# Patient Record
Sex: Male | Born: 1940 | Race: White | Hispanic: No | Smoking: Former smoker
Health system: Southern US, Community
[De-identification: ages and names within clinical notes are randomized; demographics above are authoritative.]

## PROBLEM LIST (undated history)

## (undated) DIAGNOSIS — I639 Cerebral infarction, unspecified: Secondary | ICD-10-CM

## (undated) DIAGNOSIS — E785 Hyperlipidemia, unspecified: Secondary | ICD-10-CM

## (undated) DIAGNOSIS — Z8619 Personal history of other infectious and parasitic diseases: Secondary | ICD-10-CM

## (undated) DIAGNOSIS — E669 Obesity, unspecified: Secondary | ICD-10-CM

## (undated) DIAGNOSIS — H53462 Homonymous bilateral field defects, left side: Secondary | ICD-10-CM

## (undated) DIAGNOSIS — I10 Essential (primary) hypertension: Secondary | ICD-10-CM

## (undated) DIAGNOSIS — I6529 Occlusion and stenosis of unspecified carotid artery: Secondary | ICD-10-CM

## (undated) HISTORY — DX: Essential (primary) hypertension: I10

## (undated) HISTORY — PX: EYE SURGERY: SHX253

## (undated) HISTORY — DX: Hyperlipidemia, unspecified: E78.5

## (undated) HISTORY — DX: Occlusion and stenosis of unspecified carotid artery: I65.29

## (undated) HISTORY — DX: Homonymous bilateral field defects, left side: H53.462

## (undated) HISTORY — PX: WISDOM TOOTH EXTRACTION: SHX21

## (undated) HISTORY — DX: Personal history of other infectious and parasitic diseases: Z86.19

## (undated) HISTORY — DX: Obesity, unspecified: E66.9

---

## 2002-07-12 HISTORY — PX: CATARACT EXTRACTION: SUR2

## 2007-02-17 ENCOUNTER — Ambulatory Visit: Payer: Self-pay | Admitting: Ophthalmology

## 2007-02-22 ENCOUNTER — Ambulatory Visit: Payer: Self-pay | Admitting: Ophthalmology

## 2007-02-22 ENCOUNTER — Ambulatory Visit: Payer: Self-pay | Admitting: Family Medicine

## 2012-05-12 DIAGNOSIS — I639 Cerebral infarction, unspecified: Secondary | ICD-10-CM

## 2012-05-12 DIAGNOSIS — H53462 Homonymous bilateral field defects, left side: Secondary | ICD-10-CM

## 2012-05-12 HISTORY — DX: Cerebral infarction, unspecified: I63.9

## 2012-05-12 HISTORY — DX: Homonymous bilateral field defects, left side: H53.462

## 2012-05-18 ENCOUNTER — Inpatient Hospital Stay (HOSPITAL_COMMUNITY)
Admission: EM | Admit: 2012-05-18 | Discharge: 2012-05-20 | DRG: 066 | Disposition: A | Discharge status: Home / Self Care | Payer: Medicare Other | Attending: Internal Medicine | Admitting: Internal Medicine

## 2012-05-18 ENCOUNTER — Encounter (HOSPITAL_COMMUNITY): Payer: Self-pay | Admitting: Family Medicine

## 2012-05-18 ENCOUNTER — Observation Stay (HOSPITAL_COMMUNITY): Payer: Medicare Other

## 2012-05-18 DIAGNOSIS — I6320 Cerebral infarction due to unspecified occlusion or stenosis of unspecified precerebral arteries: Secondary | ICD-10-CM | POA: Diagnosis present

## 2012-05-18 DIAGNOSIS — Z87891 Personal history of nicotine dependence: Secondary | ICD-10-CM | POA: Diagnosis not present

## 2012-05-18 DIAGNOSIS — I63239 Cerebral infarction due to unspecified occlusion or stenosis of unspecified carotid arteries: Principal | ICD-10-CM | POA: Diagnosis present

## 2012-05-18 DIAGNOSIS — I6523 Occlusion and stenosis of bilateral carotid arteries: Secondary | ICD-10-CM

## 2012-05-18 DIAGNOSIS — R200 Anesthesia of skin: Secondary | ICD-10-CM

## 2012-05-18 DIAGNOSIS — Z9119 Patient's noncompliance with other medical treatment and regimen: Secondary | ICD-10-CM | POA: Diagnosis not present

## 2012-05-18 DIAGNOSIS — I10 Essential (primary) hypertension: Secondary | ICD-10-CM | POA: Diagnosis not present

## 2012-05-18 DIAGNOSIS — I639 Cerebral infarction, unspecified: Secondary | ICD-10-CM

## 2012-05-18 DIAGNOSIS — R209 Unspecified disturbances of skin sensation: Secondary | ICD-10-CM | POA: Diagnosis not present

## 2012-05-18 DIAGNOSIS — Z7982 Long term (current) use of aspirin: Secondary | ICD-10-CM

## 2012-05-18 DIAGNOSIS — E785 Hyperlipidemia, unspecified: Secondary | ICD-10-CM | POA: Diagnosis present

## 2012-05-18 DIAGNOSIS — Z9849 Cataract extraction status, unspecified eye: Secondary | ICD-10-CM

## 2012-05-18 DIAGNOSIS — R6889 Other general symptoms and signs: Secondary | ICD-10-CM | POA: Diagnosis not present

## 2012-05-18 DIAGNOSIS — I6529 Occlusion and stenosis of unspecified carotid artery: Secondary | ICD-10-CM | POA: Diagnosis not present

## 2012-05-18 DIAGNOSIS — I6350 Cerebral infarction due to unspecified occlusion or stenosis of unspecified cerebral artery: Secondary | ICD-10-CM

## 2012-05-18 DIAGNOSIS — I658 Occlusion and stenosis of other precerebral arteries: Secondary | ICD-10-CM

## 2012-05-18 DIAGNOSIS — Z79899 Other long term (current) drug therapy: Secondary | ICD-10-CM

## 2012-05-18 DIAGNOSIS — Z8673 Personal history of transient ischemic attack (TIA), and cerebral infarction without residual deficits: Secondary | ICD-10-CM | POA: Insufficient documentation

## 2012-05-18 DIAGNOSIS — G458 Other transient cerebral ischemic attacks and related syndromes: Secondary | ICD-10-CM | POA: Diagnosis not present

## 2012-05-18 DIAGNOSIS — Z91199 Patient's noncompliance with other medical treatment and regimen due to unspecified reason: Secondary | ICD-10-CM

## 2012-05-18 LAB — CBC WITH DIFFERENTIAL/PLATELET
Basophils Absolute: 0 10*3/uL (ref 0.0–0.1)
Basophils Relative: 0 % (ref 0–1)
HCT: 41.7 % (ref 39.0–52.0)
Hemoglobin: 14.4 g/dL (ref 13.0–17.0)
Lymphocytes Relative: 38 % (ref 12–46)
MCHC: 34.5 g/dL (ref 30.0–36.0)
Neutro Abs: 3.9 10*3/uL (ref 1.7–7.7)
Neutrophils Relative %: 56 % (ref 43–77)
RDW: 13 % (ref 11.5–15.5)
WBC: 6.9 10*3/uL (ref 4.0–10.5)

## 2012-05-18 LAB — COMPREHENSIVE METABOLIC PANEL
ALT: 27 U/L (ref 0–53)
AST: 28 U/L (ref 0–37)
Albumin: 3.7 g/dL (ref 3.5–5.2)
Alkaline Phosphatase: 54 U/L (ref 39–117)
CO2: 24 mEq/L (ref 19–32)
Chloride: 103 mEq/L (ref 96–112)
GFR calc non Af Amer: 64 mL/min — ABNORMAL LOW (ref >90)
Potassium: 4.1 mEq/L (ref 3.5–5.1)
Total Bilirubin: 0.3 mg/dL (ref 0.3–1.2)

## 2012-05-18 LAB — URINALYSIS, ROUTINE W REFLEX MICROSCOPIC
Bilirubin Urine: NEGATIVE
Glucose, UA: NEGATIVE mg/dL
Hgb urine dipstick: NEGATIVE
Ketones, ur: NEGATIVE mg/dL
Leukocytes, UA: NEGATIVE
Protein, ur: NEGATIVE mg/dL
pH: 6.5 (ref 5.0–8.0)

## 2012-05-18 LAB — HEMOGLOBIN A1C
Hgb A1c MFr Bld: 5.4 % (ref <5.7)
Mean Plasma Glucose: 108 mg/dL (ref <117)

## 2012-05-18 MED ORDER — AMLODIPINE BESYLATE 5 MG PO TABS
5.0000 mg | ORAL_TABLET | Freq: Every day | ORAL | Status: DC
  Administered 2012-05-18 – 2012-05-19 (×2): 5 mg via ORAL
  Filled 2012-05-18 (×5): qty 1

## 2012-05-18 MED ORDER — SENNOSIDES-DOCUSATE SODIUM 8.6-50 MG PO TABS
1.0000 | ORAL_TABLET | Freq: Every evening | ORAL | Status: DC | PRN
  Administered 2012-05-18: 1 via ORAL
  Filled 2012-05-18: qty 1

## 2012-05-18 MED ORDER — METOPROLOL TARTRATE 1 MG/ML IV SOLN
5.0000 mg | Freq: Once | INTRAVENOUS | Status: AC
  Administered 2012-05-18: 5 mg via INTRAVENOUS
  Filled 2012-05-18: qty 5

## 2012-05-18 MED ORDER — ASPIRIN 300 MG RE SUPP
300.0000 mg | Freq: Every day | RECTAL | Status: DC
  Filled 2012-05-18 (×3): qty 1

## 2012-05-18 MED ORDER — ENOXAPARIN SODIUM 40 MG/0.4ML ~~LOC~~ SOLN
40.0000 mg | SUBCUTANEOUS | Status: DC
  Administered 2012-05-18 – 2012-05-19 (×2): 40 mg via SUBCUTANEOUS
  Filled 2012-05-18 (×3): qty 0.4

## 2012-05-18 MED ORDER — METOPROLOL TARTRATE 1 MG/ML IV SOLN: 5.0000 mg | Freq: Once | INTRAVENOUS | Status: DC

## 2012-05-18 MED ORDER — ACETAMINOPHEN 500 MG PO TABS
1000.0000 mg | ORAL_TABLET | Freq: Four times a day (QID) | ORAL | Status: DC | PRN
  Filled 2012-05-18: qty 2

## 2012-05-18 MED ORDER — ASPIRIN 325 MG PO TABS
325.0000 mg | ORAL_TABLET | Freq: Every day | ORAL | Status: DC
  Administered 2012-05-18 – 2012-05-19 (×2): 325 mg via ORAL
  Filled 2012-05-18 (×3): qty 1

## 2012-05-18 NOTE — ED Notes (Signed)
Per EMS, pt had one episode of numbness on Monday that resolved and an episode today that resolved. Pt very hypertensive in the 200 systolic. Pt upset. Pt has HA over right eye. No speech difficulties.

## 2012-05-18 NOTE — H&P (Signed)
[facility] History and Physical  Craig Avery WGN:[id number] DOB: 02/23/1941 DOA: 05/18/2012  Referring physician: ED PCP: Patient does not go to doctors Specialists: Neuro  Chief Complaint:  Chief Complaint  Patient presents with  . Hypertension  . Numbness     HPI: Craig Avery is a 71 y.o. male with hx of untreated HTN and noncompliance who was brought to the ED today by his wife for left body numbness that happened twice in the past 72 hrs. His symptoms resolved by now and he denies any associated weakness. He denies any speech difficulties, balance problems, vision problems. MRI in the emergency room his positive for stroke in the occipital lobes as well as severe posterior circulation atherosclerosis. Patient also has bilateral internal carotid artery stenosis with left side being more than 80% occluded. He has known long-standing hypertension for which she has never taken any treatment beyond one month at a time. Patient is to abuse tobacco but quit about 15 years ago   Review of Systems: The patient denies anorexia, fever, weight loss,, vision loss, decreased hearing, hoarseness, chest pain, syncope, dyspnea on exertion, peripheral edema, balance deficits, hemoptysis, abdominal pain, melena, hematochezia, severe indigestion/heartburn, hematuria, incontinence, genital sores, muscle weakness, suspicious skin lesions, transient blindness, difficulty walking, depression, unusual weight change, abnormal bleeding, enlarged lymph nodes,  Past medical history is positive for untreated hypertension Past surgical history is positive for  cataract surgery   Social History: Patient used to smoke heavily but quit 15 years ago. He doesn't drink alcohol. He is married and still works full-time as a Music therapist   No Known Allergies  History reviewed. No pertinent family history. particularly negative for premature coronary artery disease   Prior to Admission medications   Medication  Sig Start Date End Date Taking? Authorizing Provider  acetaminophen (TYLENOL) 500 MG tablet Take 1,000 mg by mouth every 6 (six) hours as needed. For pain   Yes Historical Provider, MD   Physical Exam: Filed Vitals:   05/18/12 1001 05/18/12 1036 05/18/12 1204 05/18/12 1206  BP: 224/73 217/68 203/65 197/62  Pulse: 65  51   Temp: 98.6 F (37 C)  98.5 F (36.9 C)   TempSrc: Oral  Oral   Resp: 20  10   SpO2: 99% 95% 97%      General:  Alert and oriented x3, normocephalic atraumatic, in no acute respiratory distress, calm and cooperative, alopecia  Eyes: Pupil equal round react to light accommodation, extraocular movement is intact  ENT: Clear pharynx, normal appearing external ears  Neck: No thyromegaly no jugular venous distention  Cardiovascular: Regular rate and rhythm, bradycardic, no murmurs rubs or gallops  Respiratory: Clear to auscultation bilaterally without wheeze rhonchi crackles  Abdomen: Soft nontender nondistended, bowel sounds are present  Skin: Warm dry without rashes  Musculoskeletal: No significant muscle atrophy,   Psychiatric: Euthymic  Neurologic: Cranial nerves 2-12 intact, 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, strength 5 out of 5 in all 4 extremities, sensation intact, finger to nose intact, deep tendon reflexes +2 and symmetric, plantars downgoing  Labs on Admission:  Basic Metabolic Panel:  Lab 05/18/12 7846  NA 138  K 4.1  CL 103  CO2 24  GLUCOSE 145*  BUN 13  CREATININE 1.11  CALCIUM 9.1  MG --  PHOS --   Liver Function Tests:  Lab 05/18/12 1029  AST 28  ALT 27  ALKPHOS 54  BILITOT 0.3  PROT 7.0  ALBUMIN 3.7   No results found for this  basename: LIPASE:5,AMYLASE:5 in the last 168 hours No results found for this basename: AMMONIA:5 in the last 168 hours CBC:  Lab 05/18/12 1029  WBC 6.9  NEUTROABS 3.9  HGB 14.4  HCT 41.7  MCV 89.3  PLT 194   Cardiac Enzymes:  Lab 05/18/12 1029  CKTOTAL --  CKMB --    CKMBINDEX --  TROPONINI <0.30    BNP (last 3 results) No results found for this basename: PROBNP:3 in the last 8760 hours CBG: No results found for this basename: GLUCAP:5 in the last 168 hours  Radiological Exams on Admission: Mr Brain Wo Contrast  05/18/2012  *RADIOLOGY REPORT*  Clinical Data:  71 year old male with left side numbness and tingling.  Hypertension.  Comparison: None.  MRI HEAD WITHOUT CONTRAST  Technique: Multiplanar, multiecho pulse sequences of the brain and surrounding structures were obtained according to standard protocol without intravenous contrast.  Findings: There is an 11 mm globular area of restricted diffusion in the right occipital pole mostly affecting white matter (series 3 image 11).  There is also restricted diffusion in the body of the right hippocampus (series 3 image 13).  No other definite diffusion abnormality.  These areas show T2 and FLAIR hyperintensity without mass effect or hemorrhage.  Major intracranial vascular flow voids are preserved.  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  Mild involvement of the left lentiform nuclei.  Possible tiny chronic lacunar infarct in the medial left thalamus.  Other deep gray matter nuclei within normal limits.  Probable inconsequential dilated perivascular spaces in the mid brain. Brainstem, cerebellum, and visualized cervical spine are within normal limits.  No cerebral cortical encephalomalacia.  Negative pituitary. No midline shift, mass effect, or evidence of mass lesion.  No ventriculomegaly.  Normal marrow signal.  Postoperative changes to the globes. Scattered left mastoid air cell fluid.  Negative visualized nasopharynx.  Other Visualized paranasal sinuses and mastoids are clear.  Negative scalp soft tissues.  IMPRESSION: 1.  Small acute infarcts in the right hippocampus and right occipital lobe, implicating right PCA territory ischemia.  No mass effect or hemorrhage. 2.  Chronic small  vessel ischemia in the left basal ganglia and possibly also the medial left thalamus. 3.  See MRA findings below. 4. Mild left mastoid effusion.  MRA HEAD WITHOUT CONTRAST  Technique: Angiographic images of the Circle of Willis were obtained using MRA technique without  intravenous contrast.  Findings: Antegrade flow in the posterior circulation with relatively codominant distal vertebral arteries.  Normal PICA vessels.  Patent vertebrobasilar junction.  No basilar stenosis or irregularity.  SCA and PCA origins are within normal limits.  In the left PCA P2 segment there is a short segment stenosis which appears moderate to severe.  There is preserved more distal left PCA flow.  On the right there is a larger 45 mm segment of loss of flow signal in the distal right P2 segment.  More distal PCA flow is attenuated beyond this point.  Diminutive right posterior communicating artery.  Antegrade flow in both ICA siphons.  Cavernous and supraclinoid segment irregularity, but no hemodynamically significant ICA stenosis.  Ophthalmic artery origins are within normal limits. Small infundibulum of the distal left ICA probably related to the anterior choroidal artery origin.  The left posterior communicating artery is diminutive or absent.  Normal right posterior communicating artery origin.  Carotid termini are patent.  ACA origins are within normal limits.  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  Other visualized ACA branches are within normal limits.  MCA origins are patent.  Both M1 segment are regular, more so the right.  There is a mild proximal left MCA M1 stenosis.  There is a mild mid right M1 segment stenosis.  Left MCA trifurcation and right MCA bifurcation are patent.  Minimal to mild irregularity in the visualized MCA branches.  IMPRESSION: 1.  High-grade stenosis or focal near occlusion of the right PCA distal P2 segment with  attenuated more distal right PCA flow. 2.  Moderate to severe focal stenosis of the left PCA P2 segment. 3.  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 4.  Mild to moderate anterior circulation atherosclerosis, most pronounced in the MCA M1 segments.  Study discussed by telephone with Dr. Chaney Malling on 05/18/2012 at 1350 hours.   Original Report Authenticated By: Erskine Speed, M.D.    Mr Mra Head/brain Wo Cm  05/18/2012  *RADIOLOGY REPORT*  Clinical Data:  71 year old male with left side numbness and tingling.  Hypertension.  Comparison: None.  MRI HEAD WITHOUT CONTRAST  Technique: Multiplanar, multiecho pulse sequences of the brain and surrounding structures were obtained according to standard protocol without intravenous contrast.  Findings: There is an 11 mm globular area of restricted diffusion in the right occipital pole mostly affecting white matter (series 3 image 11).  There is also restricted diffusion in the body of the right hippocampus (series 3 image 13).  No other definite diffusion abnormality.  These areas show T2 and FLAIR hyperintensity without mass effect or hemorrhage.  Major intracranial vascular flow voids are preserved.  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  Mild involvement of the left lentiform nuclei.  Possible tiny chronic lacunar infarct in the medial left thalamus.  Other deep gray matter nuclei within normal limits.  Probable inconsequential dilated perivascular spaces in the mid brain. Brainstem, cerebellum, and visualized cervical spine are within normal limits.  No cerebral cortical encephalomalacia.  Negative pituitary. No midline shift, mass effect, or evidence of mass lesion.  No ventriculomegaly.  Normal marrow signal.  Postoperative changes to the globes. Scattered left mastoid air cell fluid.  Negative visualized nasopharynx.  Other Visualized paranasal sinuses and mastoids are clear.  Negative scalp soft tissues.  IMPRESSION: 1.   Small acute infarcts in the right hippocampus and right occipital lobe, implicating right PCA territory ischemia.  No mass effect or hemorrhage. 2.  Chronic small vessel ischemia in the left basal ganglia and possibly also the medial left thalamus. 3.  See MRA findings below. 4. Mild left mastoid effusion.  MRA HEAD WITHOUT CONTRAST  Technique: Angiographic images of the Circle of Willis were obtained using MRA technique without  intravenous contrast.  Findings: Antegrade flow in the posterior circulation with relatively codominant distal vertebral arteries.  Normal PICA vessels.  Patent vertebrobasilar junction.  No basilar stenosis or irregularity.  SCA and PCA origins are within normal limits.  In the left PCA P2 segment there is a short segment stenosis which appears moderate to severe.  There is preserved more distal left PCA flow.  On the right there is a larger 45 mm segment of loss of flow signal in the distal right P2 segment.  More distal PCA flow is attenuated beyond this point.  Diminutive right posterior communicating artery.  Antegrade flow in both ICA siphons.  Cavernous and supraclinoid segment irregularity, but no hemodynamically significant ICA stenosis.  Ophthalmic artery origins are within normal limits. Small infundibulum of the distal left ICA probably related to the anterior choroidal artery origin.  The left posterior communicating artery is diminutive or absent.  Normal right posterior communicating artery origin.  Carotid termini are patent.  ACA origins are within normal limits.  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  Other visualized ACA branches are within normal limits.  MCA origins are patent.  Both M1 segment are regular, more so the right.  There is a mild proximal left MCA M1 stenosis.  There is a mild mid right M1 segment stenosis.  Left MCA trifurcation and right MCA bifurcation are patent.   Minimal to mild irregularity in the visualized MCA branches.  IMPRESSION: 1.  High-grade stenosis or focal near occlusion of the right PCA distal P2 segment with attenuated more distal right PCA flow. 2.  Moderate to severe focal stenosis of the left PCA P2 segment. 3.  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 4.  Mild to moderate anterior circulation atherosclerosis, most pronounced in the MCA M1 segments.  Study discussed by telephone with Dr. Chaney Malling on 05/18/2012 at 1350 hours.   Original Report Authenticated By: Erskine Speed, M.D.     EKG: Independently reviewed. Sinus bradycardia without ST changes  Assessment/Plan Principal Problem:  *Stroke Active Problems:  HTN (hypertension)  Carotid stenosis, bilateral  S/P cataract surgery   1. Stroke-in the posterior circulation-most likely etiology is due to atherosclerosis. Plan for admission to the hospital, start aspirin, check fasting lipid panel, check hemoglobin A1c, obtain neurological consultation 2. Widespread extracranial and intracranial atherosclerosis-probably patient would benefit from CT angiogram to better delineate the lesions. Further plans depending on recommendation from neurology team 3. Hypertension-initiate gentle treatment-start amlodipine 5 mg daily  Neurology will see later tonight   Code Status: full  Family Communication: wife at bedside  Disposition Plan: home (LOS 2 days)  Time spent: 1 hour  Ragnar Waas [facility] Pager [phone]  If 7PM-7AM, please contact night-coverage [email or web address] Password [facility] 05/18/2012, 3:27 PM

## 2012-05-18 NOTE — Progress Notes (Signed)
  Echocardiogram 2D Echocardiogram has been performed.  Craig Avery 05/18/2012, 11:54 AM

## 2012-05-18 NOTE — ED Provider Notes (Signed)
11:17 AM Patient with a hx sig for intermittent left sided numbness for 4 days was placed in CDU on TIA protocol by Dr Silverio Lay. Patient care resumed from Dr. Silverio Lay.  Patient is here for possible TIA. Patient re-evaluated and is resting comfortable, VSS, with no new complaints or concerns at this time. No neuro deficits and patient is asymptomatic at this time. Plan per previous provider is to wait for MRI/MRA results to determine patient disposition. On exam: hemodynamically stable, NAD, heart w/ RRR, lungs CTAB, Chest & abd non-tender, no peripheral edema or calf tenderness.   12:54 PM Carotids shows >80% occlusion which qualifies the patient for admission. MRI of brain pending then will consult for admission.   2:22 PM Imaging shows acute infarct. Will consult hospitalist and neurology.   Emilia Beck, PA-C 05/19/12 1601

## 2012-05-18 NOTE — ED Notes (Signed)
Pt to radiology transport by D.T.

## 2012-05-18 NOTE — Progress Notes (Signed)
VASCULAR LAB PRELIMINARY  PRELIMINARY  PRELIMINARY  PRELIMINARY  Carotid Dopplers completed.    Preliminary report:  There is 40-59% right ICA stenosis, highest end of scale.  There is >80% left ICA stenosis.  Bilateral vertebral artery flow is antegrade.  Khara Renaud, 05/18/2012, 11:38 AM

## 2012-05-18 NOTE — Consult Note (Signed)
Referring Physician: Dr. Lavera Guise    Chief Complaint: Recurrent episodes of numbness involving his left side.  HPI: Craig Avery is an 71 y.o. male history of hypertension and poor compliance with recommended treatment, who presented with history of recurrent episodes of numbness involving his left side starting on 05/15/2012. Patient had 3 episodes of complete numbness involving face arm and leg, last of which occurred at 8 AM today and lasted about half an hour. Symptoms resolved completely after each occurrence of numbness. There is no previous history of stroke nor TIA. Patient has not been on antiplatelet therapy. MRI of his brain showed acute infarction involving the right occipital region and right thalamus (right posterior cerebral artery distribution). MRA showed diffuse atherosclerotic changes with a high-grade stenosis of the P2 segment of the right posterior cerebral artery, as well as moderate to severe stenosis of the P2 segment of the left posterior cerebral artery. 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 NIH stroke score was 0.  LSN: 8 AM today tPA Given: No: Rapid resolution of deficits MRankin: 0  History reviewed. No pertinent past medical history.  History reviewed. No pertinent family history.   Medications:  Scheduled:   . amLODipine  5 mg Oral Daily  . aspirin  300 mg Rectal Daily   Or  . aspirin  325 mg Oral Daily  . enoxaparin  40 mg Subcutaneous Q24H  . [COMPLETED] metoprolol  5 mg Intravenous Once  . [DISCONTINUED] metoprolol  5 mg Intravenous Once   NWG:[id number], senna-docusate   Physical Examination: Blood pressure 157/84, pulse 52, temperature 98 F (36.7 C), temperature source Oral, resp. rate 18, weight 94.1 kg (207 lb 7.3 oz), SpO2 100.00%.  Neurologic Examination: Mental Status: Alert, oriented, thought content appropriate.  Speech fluent without evidence of aphasia. Able to follow commands without  difficulty. Cranial Nerves: II-Visual fields were normal. III/IV/VI-Pupils were equal and reacted. Extraocular movements were full and conjugate.    V/VII-no facial numbness and no facial weakness. VIII-normal. X-normal speech and symmetrical palatal movement. Motor: 5/5 bilaterally with normal tone and bulk Sensory: Normal throughout. Deep Tendon Reflexes: 1+ and symmetric. Plantars: Flexor bilaterally Cerebellar: Normal finger-to-nose testing. Carotid auscultation: Normal   Mr Brain Wo Contrast  05/18/2012  *RADIOLOGY REPORT*  Clinical Data:  71 year old male with left side numbness and tingling.  Hypertension.  Comparison: None.  MRI HEAD WITHOUT CONTRAST  Technique: Multiplanar, multiecho pulse sequences of the brain and surrounding structures were obtained according to standard protocol without intravenous contrast.  Findings: There is an 11 mm globular area of restricted diffusion in the right occipital pole mostly affecting white matter (series 3 image 11).  There is also restricted diffusion in the body of the right hippocampus (series 3 image 13).  No other definite diffusion abnormality.  These areas show T2 and FLAIR hyperintensity without mass effect or hemorrhage.  Major intracranial vascular flow voids are preserved.  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  Mild involvement of the left lentiform nuclei.  Possible tiny chronic lacunar infarct in the medial left thalamus.  Other deep gray matter nuclei within normal limits.  Probable inconsequential dilated perivascular spaces in the mid brain. Brainstem, cerebellum, and visualized cervical spine are within normal limits.  No cerebral cortical encephalomalacia.  Negative pituitary. No midline shift, mass effect, or evidence of mass lesion.  No ventriculomegaly.  Normal marrow signal.  Postoperative changes to the globes. Scattered left mastoid air cell fluid.  Negative visualized nasopharynx.  Other Visualized  paranasal sinuses and mastoids are clear.  Negative scalp soft tissues.  IMPRESSION: 1.  Small acute infarcts in the right hippocampus and right occipital lobe, implicating right PCA territory ischemia.  No mass effect or hemorrhage. 2.  Chronic small vessel ischemia in the left basal ganglia and possibly also the medial left thalamus. 3.  See MRA findings below. 4. Mild left mastoid effusion.  MRA HEAD WITHOUT CONTRAST  Technique: Angiographic images of the Circle of Willis were obtained using MRA technique without  intravenous contrast.  Findings: Antegrade flow in the posterior circulation with relatively codominant distal vertebral arteries.  Normal PICA vessels.  Patent vertebrobasilar junction.  No basilar stenosis or irregularity.  SCA and PCA origins are within normal limits.  In the left PCA P2 segment there is a short segment stenosis which appears moderate to severe.  There is preserved more distal left PCA flow.  On the right there is a larger 45 mm segment of loss of flow signal in the distal right P2 segment.  More distal PCA flow is attenuated beyond this point.  Diminutive right posterior communicating artery.  Antegrade flow in both ICA siphons.  Cavernous and supraclinoid segment irregularity, but no hemodynamically significant ICA stenosis.  Ophthalmic artery origins are within normal limits. Small infundibulum of the distal left ICA probably related to the anterior choroidal artery origin.  The left posterior communicating artery is diminutive or absent.  Normal right posterior communicating artery origin.  Carotid termini are patent.  ACA origins are within normal limits.  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  Other visualized ACA branches are within normal limits.  MCA origins are patent.  Both M1 segment are regular, more so the right.  There is a mild proximal left MCA M1 stenosis.  There is a mild mid right  M1 segment stenosis.  Left MCA trifurcation and right MCA bifurcation are patent.  Minimal to mild irregularity in the visualized MCA branches.  IMPRESSION: 1.  High-grade stenosis or focal near occlusion of the right PCA distal P2 segment with attenuated more distal right PCA flow. 2.  Moderate to severe focal stenosis of the left PCA P2 segment. 3.  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 4.  Mild to moderate anterior circulation atherosclerosis, most pronounced in the MCA M1 segments.  Study discussed by telephone with Dr. Chaney Malling on 05/18/2012 at 1350 hours.   Original Report Authenticated By: Erskine Speed, M.D.    Mr Mra Head/brain Wo Cm  05/18/2012  *RADIOLOGY REPORT*  Clinical Data:  71 year old male with left side numbness and tingling.  Hypertension.  Comparison: None.  MRI HEAD WITHOUT CONTRAST  Technique: Multiplanar, multiecho pulse sequences of the brain and surrounding structures were obtained according to standard protocol without intravenous contrast.  Findings: There is an 11 mm globular area of restricted diffusion in the right occipital pole mostly affecting white matter (series 3 image 11).  There is also restricted diffusion in the body of the right hippocampus (series 3 image 13).  No other definite diffusion abnormality.  These areas show T2 and FLAIR hyperintensity without mass effect or hemorrhage.  Major intracranial vascular flow voids are preserved.  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  Mild involvement of the left lentiform nuclei.  Possible tiny chronic lacunar infarct in the medial left thalamus.  Other deep  gray matter nuclei within normal limits.  Probable inconsequential dilated perivascular spaces in the mid brain. Brainstem, cerebellum, and visualized cervical spine are within normal limits.  No cerebral cortical encephalomalacia.  Negative pituitary. No midline shift, mass effect, or evidence of mass lesion.  No  ventriculomegaly.  Normal marrow signal.  Postoperative changes to the globes. Scattered left mastoid air cell fluid.  Negative visualized nasopharynx.  Other Visualized paranasal sinuses and mastoids are clear.  Negative scalp soft tissues.  IMPRESSION: 1.  Small acute infarcts in the right hippocampus and right occipital lobe, implicating right PCA territory ischemia.  No mass effect or hemorrhage. 2.  Chronic small vessel ischemia in the left basal ganglia and possibly also the medial left thalamus. 3.  See MRA findings below. 4. Mild left mastoid effusion.  MRA HEAD WITHOUT CONTRAST  Technique: Angiographic images of the Circle of Willis were obtained using MRA technique without  intravenous contrast.  Findings: Antegrade flow in the posterior circulation with relatively codominant distal vertebral arteries.  Normal PICA vessels.  Patent vertebrobasilar junction.  No basilar stenosis or irregularity.  SCA and PCA origins are within normal limits.  In the left PCA P2 segment there is a short segment stenosis which appears moderate to severe.  There is preserved more distal left PCA flow.  On the right there is a larger 45 mm segment of loss of flow signal in the distal right P2 segment.  More distal PCA flow is attenuated beyond this point.  Diminutive right posterior communicating artery.  Antegrade flow in both ICA siphons.  Cavernous and supraclinoid segment irregularity, but no hemodynamically significant ICA stenosis.  Ophthalmic artery origins are within normal limits. Small infundibulum of the distal left ICA probably related to the anterior choroidal artery origin.  The left posterior communicating artery is diminutive or absent.  Normal right posterior communicating artery origin.  Carotid termini are patent.  ACA origins are within normal limits.  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  Other visualized ACA branches are  within normal limits.  MCA origins are patent.  Both M1 segment are regular, more so the right.  There is a mild proximal left MCA M1 stenosis.  There is a mild mid right M1 segment stenosis.  Left MCA trifurcation and right MCA bifurcation are patent.  Minimal to mild irregularity in the visualized MCA branches.  IMPRESSION: 1.  High-grade stenosis or focal near occlusion of the right PCA distal P2 segment with attenuated more distal right PCA flow. 2.  Moderate to severe focal stenosis of the left PCA P2 segment. 3.  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 4.  Mild to moderate anterior circulation atherosclerosis, most pronounced in the MCA M1 segments.  Study discussed by telephone with Dr. Chaney Malling on 05/18/2012 at 1350 hours.   Original Report Authenticated By: Erskine Speed, M.D.     Assessment: 71 y.o. 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  Stroke Risk Factors - hypertension  Plan: 1. HgbA1c, fasting lipid panel 2. Echocardiogram 3. Carotid dopplers 4. Prophylactic therapy-Antiplatelet med: Aspirin 81 mg daily 5. Risk factor modification 6. Telemetry monitoring  C.R. Roseanne Reno, MD [facility] [phone]  05/18/2012, 8:21 PM

## 2012-05-18 NOTE — ED Notes (Signed)
Pt to radiology.

## 2012-05-18 NOTE — ED Provider Notes (Signed)
History     CSN: [id number]  Arrival date & time 05/18/12  4098   First MD Initiated Contact with Patient 05/18/12 [phone]      Chief Complaint  Patient presents with  . Hypertension  . Numbness    (Consider location/radiation/quality/duration/timing/severity/associated sxs/prior treatment) The history is provided by the patient.  Craig Avery is a 71 y.o. male here with L arm and leg numbness. Intermittent numbness 4 days ago on the L side that resolved. No weakness or speech problems. He hasn't seen a doctor in many years and not diagnosed with any medical problems. Today, he had another episode of numbness on L side. His BP at work was over 200 and he was sent for eval. No chest pain or SOB.    History reviewed. No pertinent past medical history.  History reviewed. No pertinent past surgical history.  History reviewed. No pertinent family history.  History  Substance Use Topics  . Smoking status: Never Smoker   . Smokeless tobacco: Not on file  . Alcohol Use: No      Review of Systems  Neurological: Positive for numbness.  All other systems reviewed and are negative.    Allergies  Review of patient's allergies indicates not on file.  Home Medications  No current outpatient prescriptions on file.  BP 224/73  Pulse 65  Temp 98.6 F (37 C) (Oral)  Resp 20  SpO2 99%  Physical Exam  Nursing note and vitals reviewed. Constitutional: He is oriented to person, place, and time. He appears well-developed and well-nourished.       NAD  HENT:  Head: Normocephalic.  Mouth/Throat: Oropharynx is clear and moist.  Eyes: Conjunctivae normal are normal. Pupils are equal, round, and reactive to light.  Neck: Normal range of motion. Neck supple.  Cardiovascular: Normal rate, regular rhythm and normal heart sounds.   Pulmonary/Chest: Effort normal and breath sounds normal. No respiratory distress. He has no wheezes. He has no rales.  Abdominal: Soft. Bowel sounds are  normal. He exhibits no distension. There is no tenderness. There is no rebound.  Musculoskeletal: Normal range of motion. He exhibits no edema and no tenderness.  Neurological: He is alert and oriented to person, place, and time. No cranial nerve deficit. Coordination normal.       Nl finger to nose, no pronator drift. NL sensation and motor otherwise.   Skin: Skin is warm and dry.  Psychiatric: He has a normal mood and affect. His behavior is normal. Judgment and thought content normal.    ED Course  Procedures (including critical care time)   Labs Reviewed  CBC WITH DIFFERENTIAL  COMPREHENSIVE METABOLIC PANEL  TROPONIN I  URINALYSIS, ROUTINE W REFLEX MICROSCOPIC  HEMOGLOBIN A1C   No results found.   1. Numbness and tingling   2. Hypertension      Date: 05/18/2012  Rate: 61  Rhythm: normal sinus rhythm  QRS Axis: normal  Intervals: normal  ST/T Wave abnormalities: normal  Conduction Disutrbances:none  Narrative Interpretation: No previous EKG   Old EKG Reviewed: unchanged     MDM  Jeremaine Reineck is a 70 y.o. male here with HTN, L sided numbness. Concerning for possible TIA vs hypertensive urgency. EKG nl, will check labs, will place on TIA protocol. I gave report to CDU PA.         Richardean Canal, MD 05/18/12 1030

## 2012-05-19 ENCOUNTER — Encounter (HOSPITAL_COMMUNITY): Payer: Self-pay | Admitting: *Deleted

## 2012-05-19 DIAGNOSIS — R209 Unspecified disturbances of skin sensation: Secondary | ICD-10-CM

## 2012-05-19 LAB — LIPID PANEL
LDL Cholesterol: 117 mg/dL — ABNORMAL HIGH (ref 0–99)
Triglycerides: 168 mg/dL — ABNORMAL HIGH (ref <150)
VLDL: 34 mg/dL (ref 0–40)

## 2012-05-19 MED ORDER — LISINOPRIL 5 MG PO TABS
5.0000 mg | ORAL_TABLET | Freq: Every day | ORAL | Status: DC
  Administered 2012-05-19: 5 mg via ORAL
  Filled 2012-05-19 (×2): qty 1

## 2012-05-19 MED ORDER — ATORVASTATIN CALCIUM 10 MG PO TABS
10.0000 mg | ORAL_TABLET | Freq: Every day | ORAL | Status: DC
  Administered 2012-05-19: 10 mg via ORAL
  Filled 2012-05-19 (×2): qty 1

## 2012-05-19 NOTE — Evaluation (Signed)
Occupational Therapy Evaluation Patient Details Name: Craig Avery MRN: [id number] DOB: 07-03-1941 Today's Date: 05/19/2012 Time: 5621-3086 OT Time Calculation (min): 18 min  OT Assessment / Plan / Recommendation Clinical Impression  This 71 y.o. male admitted with Rt. occipital and thalamic CVA.  Pt appears to be at baseline.  No deficits detected.  Will sign off.    OT Assessment  Patient does not need any further OT services    Follow Up Recommendations  No OT follow up    Barriers to Discharge      Equipment Recommendations  None recommended by PT;None recommended by OT;None recommended by SLP    Recommendations for Other Services    Frequency       Precautions / Restrictions Precautions Precautions: None Restrictions Weight Bearing Restrictions: No       ADL  Eating/Feeding: Independent Where Assessed - Eating/Feeding: Edge of bed Grooming: Wash/dry hands;Wash/dry face;Teeth care;Denture care;Independent Where Assessed - Grooming: Unsupported standing Upper Body Bathing: Independent Lower Body Bathing: Independent Where Assessed - Lower Body Bathing: Unsupported sit to stand Upper Body Dressing: Independent Where Assessed - Upper Body Dressing: Unsupported sitting Lower Body Dressing: Independent Where Assessed - Lower Body Dressing: Unsupported sit to stand Toilet Transfer: Independent Toilet Transfer Method: Sit to Barista: Regular height toilet Toileting - Clothing Manipulation and Hygiene: Independent Where Assessed - Toileting Clothing Manipulation and Hygiene: Standing Transfers/Ambulation Related to ADLs: Independentq ADL Comments: Pt. reports no difficulty with ADLs.  Pt instructed in signs/symptoms of CVA    OT Diagnosis:    OT Problem List:   OT Treatment Interventions:     OT Goals    Visit Information  Last OT Received On: 05/19/12 Assistance Needed: +1    Subjective Data  Subjective: "I had a little blurriness  on the left, but it went away" Patient Stated Goal: To get back to work and home   Prior Functioning     Home Living Lives With: Spouse Available Help at Discharge: Family;Available 24 hours/day Type of Home: House Home Access: Ramped entrance Home Layout: One level Bathroom Shower/Tub: Tub/shower unit;Curtain Firefighter: Standard Bathroom Accessibility: Yes How Accessible: Accessible via walker Home Adaptive Equipment: Straight cane;Walker - rolling;Wheelchair - manual;Bedside commode/3-in-1 Prior Function Level of Independence: Independent Able to Take Stairs?: Yes Driving: Yes Vocation: Full time employment Communication Communication: No difficulties Dominant Hand: Right         Vision/Perception Vision - Assessment Eye Alignment: Within Functional Limits Vision Assessment: Vision tested Ocular Range of Motion: Within Functional Limits Alignment/Gaze Preference: Within Defined Limits Tracking/Visual Pursuits: Able to track stimulus in all quads without difficulty Saccades: Within functional limits Convergence: Within functional limits Visual Fields: No apparent deficits Additional Comments: Pt able to read newspaper with no difficulty Perception Perception: Within Functional Limits Praxis Praxis: Intact   Cognition  Overall Cognitive Status: Appears within functional limits for tasks assessed/performed Arousal/Alertness: Awake/alert Orientation Level: Appears intact for tasks assessed Behavior During Session: [facility] for tasks performed    Extremity/Trunk Assessment Right Upper Extremity Assessment RUE ROM/Strength/Tone: Within functional levels RUE Sensation: WFL - Light Touch RUE Coordination: WFL - gross/fine motor Left Upper Extremity Assessment LUE ROM/Strength/Tone: Within functional levels LUE Sensation: WFL - Light Touch LUE Coordination: WFL - gross/fine motor Trunk Assessment Trunk Assessment: Normal     Mobility Bed Mobility Bed  Mobility: Supine to Sit;Sitting - Scoot to Edge of Bed;Sit to Supine Supine to Sit: 7: Independent Sitting - Scoot to Edge of Bed: 7: Independent Sit  to Supine: 7: Independent Transfers Transfers: Sit to Stand;Stand to Sit Sit to Stand: 7: Independent;From bed Stand to Sit: 7: Independent;To bed     Shoulder Instructions     Exercise     Balance     End of Session OT - End of Session Activity Tolerance: Patient tolerated treatment well Patient left: in bed;with call bell/phone within reach;with family/visitor present  GO     Devanny Palecek, Ursula Alert M 05/19/2012, 3:45 PM

## 2012-05-19 NOTE — Evaluation (Signed)
Speech Language Pathology Evaluation Patient Details Name: Craig Avery MRN: [id number] DOB: [birth year] Today's Date: 05/19/2012 Time: 1100-1115 SLP Time Calculation (min): 15 min  Problem List:  Patient Active Problem List  Diagnosis  . HTN (hypertension)  . Stroke  . Carotid stenosis, bilateral  . S/P cataract surgery   Past Medical History: History reviewed. No pertinent past medical history. Past Surgical History: History reviewed. No pertinent past surgical history. HPI:  Craig Avery is a 71 y.o. male with hx of untreated HTN and noncompliance who was brought to the ED today by his wife for left body numbness that happened twice in the past 72 hrs. His symptoms resolved by now and Avery denies any associated weakness. Avery denies any speech difficulties, balance problems, vision problems. MRI in the emergency room his positive for stroke in the occipital lobes as well as severe posterior circulation atherosclerosis.    Assessment / Plan / Recommendation Clinical Impression  Cognitive-linguistic evaluation revealed WFL abilities.  Deficits have appeared to resolve and patient and family deny any difficulties.  As a result, no skilled SLP services are warranted at this time.      SLP Assessment  Patient does not need any further Speech Lanaguage Pathology Services    Follow Up Recommendations  None       Pertinent Vitals/Pain none   SLP Evaluation Prior Functioning  Cognitive/Linguistic Baseline: Within functional limits Type of Home: House Lives With: Spouse Available Help at Discharge: Family;Available 24 hours/day Vocation: Full time employment   Cognition  Overall Cognitive Status: Appears within functional limits for tasks assessed Arousal/Alertness: Awake/alert    Comprehension  Auditory Comprehension Overall Auditory Comprehension: Appears within functional limits for tasks assessed Visual Recognition/Discrimination Discrimination: Within Function  Limits Reading Comprehension Reading Status: Not tested    Expression Expression Primary Mode of Expression: Verbal Verbal Expression Overall Verbal Expression: Appears within functional limits for tasks assessed Written Expression Dominant Hand: Right Written Expression: Not tested   Oral / Motor Oral Motor/Sensory Function Overall Oral Motor/Sensory Function: Appears within functional limits for tasks assessed Motor Speech Overall Motor Speech: Appears within functional limits for tasks assessed    Charlane Ferretti., CCC-SLP [phone]  Craig Avery 05/19/2012, 11:36 AM

## 2012-05-19 NOTE — Evaluation (Signed)
Physical Therapy Evaluation Patient Details Name: Craig Avery MRN: [id number] DOB: [birth year] Today's Date: 05/19/2012 Time: 1324-4010 PT Time Calculation (min): 13 min  PT Assessment / Plan / Recommendation Clinical Impression  Pt admitted with intermittent episodes of numbness in UE and LEs. Pt currently with no residual symptoms. Pt with no acute PT needs, will not follow. Educated pt on signs/symptoms as well as lifestyle modifications to decrease future risk of strokes.     PT Assessment  Patent does not need any further PT services    Follow Up Recommendations  No PT follow up          Equipment Recommendations  None recommended by PT          Precautions / Restrictions Precautions Precautions: None Restrictions Weight Bearing Restrictions: No   Pertinent Vitals/Pain No pain complaints      Mobility  Bed Mobility Bed Mobility: Supine to Sit;Sitting - Scoot to Edge of Bed;Sit to Supine Supine to Sit: 7: Independent Sitting - Scoot to Edge of Bed: 7: Independent Sit to Supine: 7: Independent Transfers Transfers: Sit to Stand;Stand to Sit Sit to Stand: 7: Independent;From bed Stand to Sit: 7: Independent;To bed Ambulation/Gait Ambulation/Gait Assistance: 7: Independent Ambulation Distance (Feet): 200 Feet Assistive device: None Ambulation/Gait Assistance Details: WFl Gait Pattern: Within Functional Limits Gait velocity: normal gait speed Stairs: Yes Stairs Assistance: 6: Modified independent (Device/Increase time) Stair Management Technique: One rail Left;Alternating pattern;Forwards Number of Stairs: 12  Modified Rankin (Stroke Patients Only) Pre-Morbid Rankin Score: No symptoms Modified Rankin: No symptoms      Visit Information  Last PT Received On: 05/19/12 Assistance Needed: +1    Subjective Data  Subjective: I feel fine Patient Stated Goal: to go home   Prior Functioning  Home Living Lives With: Spouse Available Help at Discharge:  Family;Available 24 hours/day Type of Home: House Home Access: Ramped entrance Home Layout: One level Bathroom Shower/Tub: Tub/shower unit;Curtain Firefighter: Standard Bathroom Accessibility: Yes How Accessible: Accessible via walker Home Adaptive Equipment: Straight cane;Walker - rolling;Wheelchair - manual;Bedside commode/3-in-1 Prior Function Level of Independence: Independent Able to Take Stairs?: Yes Driving: Yes 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 Communication Communication: No difficulties Dominant Hand: Right    Cognition  Overall Cognitive Status: Appears within functional limits for tasks assessed/performed Arousal/Alertness: Awake/alert Orientation Level: Appears intact for tasks assessed Behavior During Session: [facility] for tasks performed    Extremity/Trunk Assessment Right Lower Extremity Assessment RLE ROM/Strength/Tone: Within functional levels RLE Sensation: WFL - Light Touch RLE Coordination: WFL - gross/fine motor Left Lower Extremity Assessment LLE ROM/Strength/Tone: Within functional levels LLE Sensation: WFL - Light Touch LLE Coordination: WFL - gross/fine motor   Balance Balance Balance Assessed: Yes Standardized Balance Assessment Standardized Balance Assessment: Dynamic Gait Index Dynamic Gait Index Level Surface: Normal Change in Gait Speed: Normal Gait with Horizontal Head Turns: Normal Gait with Vertical Head Turns: Normal Gait and Pivot Turn: Normal Step Over Obstacle: Normal Step Around Obstacles: Normal Steps: Mild Impairment Total Score: 23   End of Session PT - End of Session Equipment Utilized During Treatment: Gait belt Activity Tolerance: Patient tolerated treatment well Patient left: in bed;with call bell/phone within reach;with family/visitor present Nurse Communication: Mobility status  GP     Milana Kidney 05/19/2012, 10:24 AM  05/19/2012 Milana Kidney DPT PAGER:  [phone] OFFICE: [phone]

## 2012-05-19 NOTE — Progress Notes (Signed)
Subjective: Patient seen and examined, admitted with left arm numbness and leg numbness for past 72 hours which has resolved at this time. Patient continues to have her blood pressure. 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  Objective: Vital signs in last 24 hours: Temp:  [97.6 F (36.4 C)-98 F (36.7 C)] 97.6 F (36.4 C) (11/08 1415) Pulse Rate:  [50-58] 58  (11/08 1415) Resp:  [15-18] 18  (11/08 1415) BP: (157-221)/(61-84) 181/78 mmHg (11/08 1415) SpO2:  [94 %-100 %] 94 % (11/08 1415) Weight:  [85.276 kg (188 lb)-94.1 kg (207 lb 7.3 oz)] 85.276 kg (188 lb) (11/08 1415) Weight change:     Consults: Neurology  Procedures: Carotid Dopplers 2-D echo  Intake/Output from previous day:   Total I/O In: 360 [P.O.:360] Out: -    Physical Exam: Head: Normocephalic, atraumatic.  Eyes: No signs of jaundice, EOMI Nose: Mucous membranes dry.  Neck: supple,No deformities, masses, or tenderness noted. Lungs: Normal respiratory effort. B/L Clear to auscultation, no crackles or wheezes.  Heart: Regular RR. S1 and S2 normal  Abdomen: BS normoactive. Soft, Nondistended, non-tender.  Extremities: No pretibial edema, no erythema Neurological alert oriented x3, no focal deficits at this time  Lab Results: Basic Metabolic Panel:  Basename 05/18/12 1029  NA 138  K 4.1  CL 103  CO2 24  GLUCOSE 145*  BUN 13  CREATININE 1.11  CALCIUM 9.1  MG --  PHOS --   Liver Function Tests:  Basename 05/18/12 1029  AST 28  ALT 27  ALKPHOS 54  BILITOT 0.3  PROT 7.0  ALBUMIN 3.7   No results found for this basename: LIPASE:2,AMYLASE:2 in the last 72 hours No results found for this basename: AMMONIA:2 in the last 72 hours CBC:  Basename 05/18/12 1029  WBC 6.9  NEUTROABS 3.9  HGB 14.4  HCT 41.7  MCV 89.3  PLT 194   Cardiac Enzymes:  Basename 05/18/12 1029  CKTOTAL --  CKMB --  CKMBINDEX --  TROPONINI <0.30  Hemoglobin  A1C:  Basename 05/19/12 0635  HGBA1C 5.4   Fasting Lipid Panel:  Basename 05/19/12 0635  CHOL 196  HDL 45  LDLCALC 117*  TRIG 168*  CHOLHDL 4.4  LDLDIRECT --   Thyroid Function Tests: No results found for this basename: TSH,T4TOTAL,FREET4,T3FREE,THYROIDAB in the last 72 hours Anemia Panel: No results found for this basename: VITAMINB12,FOLATE,FERRITIN,TIBC,IRON,RETICCTPCT in the last 72 hours Coagulation: No results found for this basename: LABPROT:2,INR:2 in the last 72 hours Urine Drug Screen: Drugs of Abuse  No results found for this basename: labopia, cocainscrnur, labbenz, amphetmu, thcu, labbarb    Alcohol Level: No results found for this basename: ETH:2 in the last 72 hours Urinalysis:  Basename 05/18/12 1057  COLORURINE YELLOW  LABSPEC 1.009  PHURINE 6.5  GLUCOSEU NEGATIVE  HGBUR NEGATIVE  BILIRUBINUR NEGATIVE  KETONESUR NEGATIVE  PROTEINUR NEGATIVE  UROBILINOGEN 0.2  NITRITE NEGATIVE  LEUKOCYTESUR NEGATIVE   Misc. Labs:  No results found for this or any previous visit (from the past 240 hour(s)).  Studies/Results: Mr Sherrin Daisy Contrast  05/18/2012  *RADIOLOGY REPORT*  Clinical Data:  71 year old male with left side numbness and tingling.  Hypertension.  Comparison: None.  MRI HEAD WITHOUT CONTRAST  Technique: Multiplanar, multiecho pulse sequences of the brain and surrounding structures were obtained according to standard protocol without intravenous contrast.  Findings: There is an 11 mm globular area of restricted diffusion in the right occipital pole mostly affecting white matter (series  3 image 11).  There is also restricted diffusion in the body of the right hippocampus (series 3 image 13).  No other definite diffusion abnormality.  These areas show T2 and FLAIR hyperintensity without mass effect or hemorrhage.  Major intracranial vascular flow voids are preserved.  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  Mild involvement of  the left lentiform nuclei.  Possible tiny chronic lacunar infarct in the medial left thalamus.  Other deep gray matter nuclei within normal limits.  Probable inconsequential dilated perivascular spaces in the mid brain. Brainstem, cerebellum, and visualized cervical spine are within normal limits.  No cerebral cortical encephalomalacia.  Negative pituitary. No midline shift, mass effect, or evidence of mass lesion.  No ventriculomegaly.  Normal marrow signal.  Postoperative changes to the globes. Scattered left mastoid air cell fluid.  Negative visualized nasopharynx.  Other Visualized paranasal sinuses and mastoids are clear.  Negative scalp soft tissues.  IMPRESSION: 1.  Small acute infarcts in the right hippocampus and right occipital lobe, implicating right PCA territory ischemia.  No mass effect or hemorrhage. 2.  Chronic small vessel ischemia in the left basal ganglia and possibly also the medial left thalamus. 3.  See MRA findings below. 4. Mild left mastoid effusion.  MRA HEAD WITHOUT CONTRAST  Technique: Angiographic images of the Circle of Willis were obtained using MRA technique without  intravenous contrast.  Findings: Antegrade flow in the posterior circulation with relatively codominant distal vertebral arteries.  Normal PICA vessels.  Patent vertebrobasilar junction.  No basilar stenosis or irregularity.  SCA and PCA origins are within normal limits.  In the left PCA P2 segment there is a short segment stenosis which appears moderate to severe.  There is preserved more distal left PCA flow.  On the right there is a larger 45 mm segment of loss of flow signal in the distal right P2 segment.  More distal PCA flow is attenuated beyond this point.  Diminutive right posterior communicating artery.  Antegrade flow in both ICA siphons.  Cavernous and supraclinoid segment irregularity, but no hemodynamically significant ICA stenosis.  Ophthalmic artery origins are within normal limits. Small infundibulum of  the distal left ICA probably related to the anterior choroidal artery origin.  The left posterior communicating artery is diminutive or absent.  Normal right posterior communicating artery origin.  Carotid termini are patent.  ACA origins are within normal limits.  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  Other visualized ACA branches are within normal limits.  MCA origins are patent.  Both M1 segment are regular, more so the right.  There is a mild proximal left MCA M1 stenosis.  There is a mild mid right M1 segment stenosis.  Left MCA trifurcation and right MCA bifurcation are patent.  Minimal to mild irregularity in the visualized MCA branches.  IMPRESSION: 1.  High-grade stenosis or focal near occlusion of the right PCA distal P2 segment with attenuated more distal right PCA flow. 2.  Moderate to severe focal stenosis of the left PCA P2 segment. 3.  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 4.  Mild to moderate anterior circulation atherosclerosis, most pronounced in the MCA M1 segments.  Study discussed by telephone with Dr. Chaney Malling on 05/18/2012 at 1350 hours.   Original Report Authenticated By: Erskine Speed, M.D.    Mr Mra Head/brain Wo Cm  05/18/2012  *RADIOLOGY REPORT*  Clinical Data:  71 year old male with left side numbness and tingling.  Hypertension.  Comparison: None.  MRI HEAD WITHOUT CONTRAST  Technique: Multiplanar, multiecho pulse sequences of the brain and surrounding structures were obtained according to standard protocol without intravenous contrast.  Findings: There is an 11 mm globular area of restricted diffusion in the right occipital pole mostly affecting white matter (series 3 image 11).  There is also restricted diffusion in the body of the right hippocampus (series 3 image 13).  No other definite diffusion abnormality.  These areas show T2 and FLAIR hyperintensity without mass effect or  hemorrhage.  Major intracranial vascular flow voids are preserved.  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  Mild involvement of the left lentiform nuclei.  Possible tiny chronic lacunar infarct in the medial left thalamus.  Other deep gray matter nuclei within normal limits.  Probable inconsequential dilated perivascular spaces in the mid brain. Brainstem, cerebellum, and visualized cervical spine are within normal limits.  No cerebral cortical encephalomalacia.  Negative pituitary. No midline shift, mass effect, or evidence of mass lesion.  No ventriculomegaly.  Normal marrow signal.  Postoperative changes to the globes. Scattered left mastoid air cell fluid.  Negative visualized nasopharynx.  Other Visualized paranasal sinuses and mastoids are clear.  Negative scalp soft tissues.  IMPRESSION: 1.  Small acute infarcts in the right hippocampus and right occipital lobe, implicating right PCA territory ischemia.  No mass effect or hemorrhage. 2.  Chronic small vessel ischemia in the left basal ganglia and possibly also the medial left thalamus. 3.  See MRA findings below. 4. Mild left mastoid effusion.  MRA HEAD WITHOUT CONTRAST  Technique: Angiographic images of the Circle of Willis were obtained using MRA technique without  intravenous contrast.  Findings: Antegrade flow in the posterior circulation with relatively codominant distal vertebral arteries.  Normal PICA vessels.  Patent vertebrobasilar junction.  No basilar stenosis or irregularity.  SCA and PCA origins are within normal limits.  In the left PCA P2 segment there is a short segment stenosis which appears moderate to severe.  There is preserved more distal left PCA flow.  On the right there is a larger 45 mm segment of loss of flow signal in the distal right P2 segment.  More distal PCA flow is attenuated beyond this point.  Diminutive right posterior communicating artery.  Antegrade flow in both ICA siphons.  Cavernous and  supraclinoid segment irregularity, but no hemodynamically significant ICA stenosis.  Ophthalmic artery origins are within normal limits. Small infundibulum of the distal left ICA probably related to the anterior choroidal artery origin.  The left posterior communicating artery is diminutive or absent.  Normal right posterior communicating artery origin.  Carotid termini are patent.  ACA origins are within normal limits.  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  Other visualized ACA branches are within normal limits.  MCA origins are patent.  Both M1 segment are regular, more so the right.  There is a mild proximal left MCA M1 stenosis.  There is a mild mid right M1 segment stenosis.  Left MCA trifurcation and right MCA bifurcation are patent.  Minimal to mild irregularity in the visualized MCA branches.  IMPRESSION: 1.  High-grade stenosis or focal near occlusion of the right PCA distal P2 segment with attenuated more distal right PCA flow. 2.  Moderate to severe focal stenosis of the left PCA P2 segment.  3.  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 4.  Mild to moderate anterior circulation atherosclerosis, most pronounced in the MCA M1 segments.  Study discussed by telephone with Dr. Chaney Malling on 05/18/2012 at 1350 hours.   Original Report Authenticated By: Erskine Speed, M.D.     Medications: Scheduled Meds:   . amLODipine  5 mg Oral Daily  . aspirin  300 mg Rectal Daily   Or  . aspirin  325 mg Oral Daily  . enoxaparin  40 mg Subcutaneous Q24H  . lisinopril  5 mg Oral Daily   Continuous Infusions:  PRN Meds:.acetaminophen, senna-docusate    Principal Problem:  *Stroke Active Problems:  HTN (hypertension)  Carotid stenosis, bilateral  S/P cataract surgery  CVA At this time patient's symptoms have resolved, and he will continue on aspirin 325  mg by mouth daily as per neurology  recommendation. He will need outpatient TEE and Holter monitoring which will be scheduled with cardiology, as outpatient  Hypertension Patient has long-standing history of hypertension, he has been started on amlodipine 5 mg will also add lisinopril 5 mg by mouth daily. Discussed with Dr. Pearlean Brownie, he'll follow up with Dr. Pearlean Brownie  in 2 months.    LOS: 1 day  Assessment/Plan: [facility] Pager: [phone] 05/19/2012, 4:16 PM

## 2012-05-19 NOTE — Progress Notes (Signed)
Stroke Team Progress Note  HISTORY Craig Avery is an 71 y.o. male history of hypertension and poor compliance with recommended treatment, who presented with history of recurrent episodes of numbness involving his left side starting on 05/15/2012. Patient had 3 episodes of complete numbness involving face arm and leg, last of which occurred at 8 AM 05/18/2012 and lasted about half an hour. Symptoms resolved completely after each occurrence of numbness. There is no previous history of stroke nor TIA. Patient has not been on antiplatelet therapy. MRI of his brain showed acute infarction involving the right occipital region and right thalamus (right posterior cerebral artery distribution). MRA showed diffuse atherosclerotic changes with a high-grade stenosis of the P2 segment of the right posterior cerebral artery, as well as moderate to severe stenosis of the P2 segment of the left posterior cerebral artery. 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 NIH stroke score was 0. Patient was not a TPA candidate secondary to resolution of symptoms, delay in arrival. He was admitted for further evaluation and treatment.  SUBJECTIVE A male family member/friend is at the bedside.  Overall he feels his condition is completely resolved.   OBJECTIVE Most recent Vital Signs: Filed Vitals:   05/19/12 0400 05/19/12 0600 05/19/12 0809 05/19/12 0954  BP: 189/74 181/61 216/74 205/62  Pulse: 51 54 57 54  Temp: 97.8 F (36.6 C) 97.8 F (36.6 C) 97.7 F (36.5 C) 97.8 F (36.6 C)  TempSrc: Oral Oral Oral Oral  Resp: 18 18 18 18   Weight:      SpO2: 98% 98% 98% 96%   CBG (last 3)  No results found for this basename: GLUCAP:3 in the last 72 hours  IV Fluid Intake:     MEDICATIONS    . amLODipine  5 mg Oral Daily  . aspirin  300 mg Rectal Daily   Or  . aspirin  325 mg Oral Daily  . enoxaparin  40 mg Subcutaneous Q24H  . [DISCONTINUED] metoprolol  5 mg Intravenous Once   PRN:  acetaminophen, senna-docusate  Diet:  Cardiac thin liquids Activity:  As tolerated DVT Prophylaxis:  Lovenox 40 mg sq daily   CLINICALLY SIGNIFICANT STUDIES Basic Metabolic Panel:  Lab 05/18/12 1610  NA 138  K 4.1  CL 103  CO2 24  GLUCOSE 145*  BUN 13  CREATININE 1.11  CALCIUM 9.1  MG --  PHOS --   Liver Function Tests:  Lab 05/18/12 1029  AST 28  ALT 27  ALKPHOS 54  BILITOT 0.3  PROT 7.0  ALBUMIN 3.7   CBC:  Lab 05/18/12 1029  WBC 6.9  NEUTROABS 3.9  HGB 14.4  HCT 41.7  MCV 89.3  PLT 194   Coagulation: No results found for this basename: LABPROT:4,INR:4 in the last 168 hours Cardiac Enzymes:  Lab 05/18/12 1029  CKTOTAL --  CKMB --  CKMBINDEX --  TROPONINI <0.30   Urinalysis:  Lab 05/18/12 1057  COLORURINE YELLOW  LABSPEC 1.009  PHURINE 6.5  GLUCOSEU NEGATIVE  HGBUR NEGATIVE  BILIRUBINUR NEGATIVE  KETONESUR NEGATIVE  PROTEINUR NEGATIVE  UROBILINOGEN 0.2  NITRITE NEGATIVE  LEUKOCYTESUR NEGATIVE   Lipid Panel    Component Value Date/Time   CHOL 196 05/19/2012 0635   TRIG 168* 05/19/2012 0635   HDL 45 05/19/2012 0635   CHOLHDL 4.4 05/19/2012 0635   VLDL 34 05/19/2012 0635   LDLCALC 117* 05/19/2012 0635   HgbA1C  Lab Results  Component Value Date   HGBA1C 5.4  05/18/2012    Urine Drug Screen:   No results found for this basename: labopia, cocainscrnur, labbenz, amphetmu, thcu, labbarb    Alcohol Level: No results found for this basename: ETH:2 in the last 168 hours  CT of the brain    MRI of the brain   1.  Small acute infarcts in the right hippocampus and right occipital lobe, implicating right PCA territory ischemia.  No mass effect or hemorrhage. 2.  Chronic small vessel ischemia in the left basal ganglia and possibly also the medial left thalamus.   MRA of the brain   1.  High-grade stenosis or focal near occlusion of the right PCA distal P2 segment with attenuated more distal right PCA flow. 2.  Moderate to severe focal stenosis of  the left PCA P2 segment. 3.  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 4.  Mild to moderate anterior circulation atherosclerosis, most pronounced in the MCA M1 segments.   2D Echocardiogram    Carotid Doppler    CXR    EKG  normal EKG, normal sinus rhythm.   Therapy Recommendations PT - ; OT -   Physical Exam   Pleasant middle-aged Caucasian male currently not in distress.Awake alert. Afebrile. Head is nontraumatic. Neck is supple without bruit. Hearing is normal. Cardiac exam no murmur or gallop. Lungs are clear to auscultation. Distal pulses are well felt.  Neurological exam ; Awake  Alert oriented x 3. Normal speech and language.eye movements full without nystagmus.fundi were not visualized. Vision acuity and fields appear normal. Face symmetric. Tongue midline. Normal strength, tone, reflexes and coordination. Normal sensation though he has subjective paresthesias on the left side. Gait deferred.  ASSESSMENT Mr. Craig Avery is a 71 y.o. male presenting with recurrent episodes of numbness involving his left side. Imaging confirms right PCA territory infarcts in setting of high grade stenosis of right PCA. Infarct felt to be embolic, artery to artery embolus secondary to right PCA stenosis.  Work up underway. On no antiplatelets prior to admission. Now on aspirin 325 mg orally every day for secondary stroke prevention. Patient with no resultant neuro deficits.  Hyperlipidemia, LDL 117, not on statin PTA, goal LDL < 100 Hypertension  Hospital day # 1  TREATMENT/PLAN  Continue aspirin 325 mg orally every day for secondary stroke prevention.  Add statin  Recommend ace inhibitor in addition to norvasc  Therapy evals  F/u 2D, carotid dopplers  F/u HgbA1c   Annie Main, MSN, RN, ANVP-BC, ANP-BC, GNP-BC [facility] Pager: [phone] 05/19/2012 10:46 AM  Scribe for Dr. Delia Heady, Stroke Center Medical Director, who has personally reviewed chart,  pertinent data, examined the patient and developed the plan of care. Pager:  [phone]

## 2012-05-20 MED ORDER — LISINOPRIL 5 MG PO TABS: 5.0000 mg | ORAL_TABLET | Freq: Every day | ORAL | Status: DC

## 2012-05-20 MED ORDER — ATORVASTATIN CALCIUM 10 MG PO TABS: 10.0000 mg | ORAL_TABLET | Freq: Every day | ORAL | Status: DC

## 2012-05-20 MED ORDER — ASPIRIN 325 MG PO TABS: 325.0000 mg | ORAL_TABLET | Freq: Every day | ORAL | Status: DC

## 2012-05-20 MED ORDER — AMLODIPINE BESYLATE 5 MG PO TABS: 5.0000 mg | ORAL_TABLET | Freq: Every day | ORAL | Status: DC

## 2012-05-20 NOTE — ED Provider Notes (Signed)
Medical screening examination/treatment/procedure(s) were performed by non-physician practitioner and as supervising physician I was immediately available for consultation/collaboration.   Richardean Canal, MD 05/20/12 2108

## 2012-05-20 NOTE — Progress Notes (Signed)
Pt. Given discharge instructions, and prescriptions. No complaints, discharged home.

## 2012-05-20 NOTE — Discharge Summary (Signed)
Physician Discharge Summary  Craig Avery AVW:[id number] DOB: 31-Oct-1940 DOA: 05/18/2012  PCP: Sheila Oats, MD  Admit date: 05/18/2012 Discharge date: 05/20/2012  Time spent: 35 minutes  Recommendations for Outpatient Follow-up:  1. patient will have a Holter monitor and TEE through [facility] cardiology in [place] 2. outpatient referral to vascular surgery for carotid artery stenosis  Discharge Diagnoses:  Stroke  HTN (hypertension)  Carotid stenosis, bilateral  S/P cataract surgery   Discharge Condition: good, neurologically intact  Diet recommendation: heart healthy   Filed Weights   05/18/12 1800 05/19/12 1415  Weight: 94.1 kg (207 lb 7.3 oz) 85.276 kg (188 lb)    History of present illness:  Craig Avery is a 71 y.o. male with hx of untreated HTN and noncompliance who was brought to the ED today by his wife for left body numbness that happened twice in the past 72 hrs. His symptoms resolved by now and he denies any associated weakness. He denies any speech difficulties, balance problems, vision problems. MRI in the emergency room his positive for stroke in the occipital lobes as well as severe posterior circulation atherosclerosis. Patient also has bilateral internal carotid artery stenosis with left side being more than 80% occluded. He has known long-standing hypertension for which she has never taken any treatment beyond one month at a time. Patient is to abuse tobacco but quit about 15 years ago  Hospital Course:  Craig Avery is an 71 y.o. male history of hypertension and poor compliance with recommended treatment, who presented with history of recurrent episodes of numbness involving his left side starting on 05/15/2012. Patient had 3 episodes of complete numbness involving face arm and leg, last of which occurred at 8 AM 05/18/2012 and lasted about half an hour. Symptoms resolved completely after each occurrence of numbness. There is no previous history of stroke  nor TIA. Patient has not been on antiplatelet therapy. MRI of his brain showed acute infarction involving the right occipital region and right thalamus (right posterior cerebral artery distribution). MRA showed diffuse atherosclerotic changes with a high-grade stenosis of the P2 segment of the right posterior cerebral artery, as well as moderate to severe stenosis of the P2 segment of the left posterior cerebral artery. 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 NIH stroke score was 0. Patient was not a TPA candidate secondary to resolution of symptoms, delay in arrival. He was admitted for further evaluation and treatment. During the hospitalization the pain that she remained stable. He was started on aspirin 325 mg daily. He had no neurological deficits and evaluation by physical therapy outpatient therapy and speech therapy did not identify any rehabilitation needs. He was found to have bilateral carotid artery stenoses which are asymptomatic at the moment. He was started on antihypertensives, aspirin, Lipitor and he will followup with Dr. said in the office and a new primary care physician. The stroke team also recommended outpatient TEE and Holter monitoring.     Procedures: Study information:  Study status: Routine. Procedure: A vascular evaluation was performed with the patient in the supine position. Image quality was adequate. The right CCA, right ECA, right ICA, right vertebral, left CCA, left ECA, left ICA, and left vertebral arteries were examined. Carotid duplex study. Carotid Duplex exam including 2D imaging, color and spectral Doppler were performed on the extracranial carotid and vertebral arteries using standard established protocols. Location: Vascular laboratory. Patient status: Observation.  Arterial flow:  +--------------------+--------+--------+ Location V sys V ed  +--------------------+--------+--------+ Right  CCA -  proximal87cm/s 16cm/s  +--------------------+--------+--------+ Right CCA - distal 82cm/s 11cm/s  +--------------------+--------+--------+ Right ECA -253cm/s-7cm/s  +--------------------+--------+--------+ Right ICA - proximal-52cm/s -12cm/s  +--------------------+--------+--------+ Right ICA - mid -144cm/s-24cm/s  +--------------------+--------+--------+ Right ICA - distal -101cm/s-21cm/s  +--------------------+--------+--------+ Right vertebral -28cm/s -10cm/s  +--------------------+--------+--------+ Left CCA - proximal 61cm/s 10cm/s  +--------------------+--------+--------+ Left CCA - distal 58cm/s 10cm/s  +--------------------+--------+--------+ Left ECA -175cm/s-13cm/s  +--------------------+--------+--------+ Left ICA - proximal -601cm/s-192cm/s +--------------------+--------+--------+ Left ICA - mid -307cm/s-71cm/s  +--------------------+--------+--------+ Left ICA - distal -26cm/s -------- +--------------------+--------+--------+ Left vertebral 23cm/s 8cm/s  +--------------------+--------+--------+  ------------------------------------------------------------ Summary: Right: moderate calcific plaque origin and proximal ICA and ECA. 40-59% ICA stenosis, highest end of scale. Moderate ECA stenosis. Left: Severe calcific plaque origin and proximal ICA and ECA. >80% ICA stenosis. Mild ECA stenosis. Bilateral vertebral artery flow is antegrade.    Consultations:  Neurology   Discharge Exam: Filed Vitals:   05/19/12 1801 05/19/12 2125 05/20/12 0234 05/20/12 0554  BP: 179/70 168/66 162/63 151/74  Pulse: 57 50 50 50  Temp: 97.5 F (36.4 C) 97.8 F (36.6 C) 97.5 F (36.4 C) 98.1 F (36.7 C)  TempSrc: Oral Oral Oral Oral  Resp: 18 18 18 18   Height:      Weight:      SpO2: 98% 96% 97% 96%    General: axox3 Cardiovascular: RRR Respiratory: ctab  Discharge Instructions  Discharge Orders    Future  Orders Please Complete By Expires   Diet - low sodium heart healthy      Increase activity slowly          Medication List     As of 05/20/2012  7:52 AM    TAKE these medications         acetaminophen 500 MG tablet   Commonly known as: TYLENOL   Take 1,000 mg by mouth every 6 (six) hours as needed. For pain      amLODipine 5 MG tablet   Commonly known as: NORVASC   Take 1 tablet (5 mg total) by mouth daily.      aspirin 325 MG tablet   Take 1 tablet (325 mg total) by mouth daily.      atorvastatin 10 MG tablet   Commonly known as: LIPITOR   Take 1 tablet (10 mg total) by mouth daily at 6 PM.      lisinopril 5 MG tablet   Commonly known as: PRINIVIL,ZESTRIL   Take 1 tablet (5 mg total) by mouth daily.           Follow-up Information    Schedule an appointment as soon as possible for a visit with Eustaquio Boyden, MD. (a soon as possible)    Contact information:   [address] [place] [address] [phone]       Follow up with Gates Rigg, MD. Schedule an appointment as soon as possible for a visit in 2 months.   Contact information:   [address], [address] [facility] [place] [address] [phone]           The results of significant diagnostics from this hospitalization (including imaging, microbiology, ancillary and laboratory) are listed below for reference.    Significant Diagnostic Studies: Mr Sherrin Daisy Contrast  05/18/2012  *RADIOLOGY REPORT*  Clinical Data:  71 year old male with left side numbness and tingling.  Hypertension.  Comparison: None.  MRI HEAD WITHOUT CONTRAST  Technique: Multiplanar, multiecho pulse sequences of the brain and surrounding structures were obtained according to standard protocol without  intravenous contrast.  Findings: There is an 11 mm globular area of restricted diffusion in the right occipital pole mostly affecting white matter (series 3 image 11).  There is also restricted  diffusion in the body of the right hippocampus (series 3 image 13).  No other definite diffusion abnormality.  These areas show T2 and FLAIR hyperintensity without mass effect or hemorrhage.  Major intracranial vascular flow voids are preserved.  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  Mild involvement of the left lentiform nuclei.  Possible tiny chronic lacunar infarct in the medial left thalamus.  Other deep gray matter nuclei within normal limits.  Probable inconsequential dilated perivascular spaces in the mid brain. Brainstem, cerebellum, and visualized cervical spine are within normal limits.  No cerebral cortical encephalomalacia.  Negative pituitary. No midline shift, mass effect, or evidence of mass lesion.  No ventriculomegaly.  Normal marrow signal.  Postoperative changes to the globes. Scattered left mastoid air cell fluid.  Negative visualized nasopharynx.  Other Visualized paranasal sinuses and mastoids are clear.  Negative scalp soft tissues.  IMPRESSION: 1.  Small acute infarcts in the right hippocampus and right occipital lobe, implicating right PCA territory ischemia.  No mass effect or hemorrhage. 2.  Chronic small vessel ischemia in the left basal ganglia and possibly also the medial left thalamus. 3.  See MRA findings below. 4. Mild left mastoid effusion.  MRA HEAD WITHOUT CONTRAST  Technique: Angiographic images of the Circle of Willis were obtained using MRA technique without  intravenous contrast.  Findings: Antegrade flow in the posterior circulation with relatively codominant distal vertebral arteries.  Normal PICA vessels.  Patent vertebrobasilar junction.  No basilar stenosis or irregularity.  SCA and PCA origins are within normal limits.  In the left PCA P2 segment there is a short segment stenosis which appears moderate to severe.  There is preserved more distal left PCA flow.  On the right there is a larger 45 mm segment of loss of flow signal in the  distal right P2 segment.  More distal PCA flow is attenuated beyond this point.  Diminutive right posterior communicating artery.  Antegrade flow in both ICA siphons.  Cavernous and supraclinoid segment irregularity, but no hemodynamically significant ICA stenosis.  Ophthalmic artery origins are within normal limits. Small infundibulum of the distal left ICA probably related to the anterior choroidal artery origin.  The left posterior communicating artery is diminutive or absent.  Normal right posterior communicating artery origin.  Carotid termini are patent.  ACA origins are within normal limits.  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  Other visualized ACA branches are within normal limits.  MCA origins are patent.  Both M1 segment are regular, more so the right.  There is a mild proximal left MCA M1 stenosis.  There is a mild mid right M1 segment stenosis.  Left MCA trifurcation and right MCA bifurcation are patent.  Minimal to mild irregularity in the visualized MCA branches.  IMPRESSION: 1.  High-grade stenosis or focal near occlusion of the right PCA distal P2 segment with attenuated more distal right PCA flow. 2.  Moderate to severe focal stenosis of the left PCA P2 segment. 3.  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 4.  Mild to moderate anterior circulation atherosclerosis, most pronounced in the MCA M1 segments.  Study discussed by telephone with Dr. Chaney Malling on 05/18/2012 at 1350 hours.  Original Report Authenticated By: Erskine Speed, M.D.    Mr Mra Head/brain Wo Cm  05/18/2012  *RADIOLOGY REPORT*  Clinical Data:  71 year old male with left side numbness and tingling.  Hypertension.  Comparison: None.  MRI HEAD WITHOUT CONTRAST  Technique: Multiplanar, multiecho pulse sequences of the brain and surrounding structures were obtained according to standard protocol without intravenous contrast.  Findings:  There is an 11 mm globular area of restricted diffusion in the right occipital pole mostly affecting white matter (series 3 image 11).  There is also restricted diffusion in the body of the right hippocampus (series 3 image 13).  No other definite diffusion abnormality.  These areas show T2 and FLAIR hyperintensity without mass effect or hemorrhage.  Major intracranial vascular flow voids are preserved.  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  Mild involvement of the left lentiform nuclei.  Possible tiny chronic lacunar infarct in the medial left thalamus.  Other deep gray matter nuclei within normal limits.  Probable inconsequential dilated perivascular spaces in the mid brain. Brainstem, cerebellum, and visualized cervical spine are within normal limits.  No cerebral cortical encephalomalacia.  Negative pituitary. No midline shift, mass effect, or evidence of mass lesion.  No ventriculomegaly.  Normal marrow signal.  Postoperative changes to the globes. Scattered left mastoid air cell fluid.  Negative visualized nasopharynx.  Other Visualized paranasal sinuses and mastoids are clear.  Negative scalp soft tissues.  IMPRESSION: 1.  Small acute infarcts in the right hippocampus and right occipital lobe, implicating right PCA territory ischemia.  No mass effect or hemorrhage. 2.  Chronic small vessel ischemia in the left basal ganglia and possibly also the medial left thalamus. 3.  See MRA findings below. 4. Mild left mastoid effusion.  MRA HEAD WITHOUT CONTRAST  Technique: Angiographic images of the Circle of Willis were obtained using MRA technique without  intravenous contrast.  Findings: Antegrade flow in the posterior circulation with relatively codominant distal vertebral arteries.  Normal PICA vessels.  Patent vertebrobasilar junction.  No basilar stenosis or irregularity.  SCA and PCA origins are within normal limits.  In the left PCA P2 segment there is a short segment stenosis  which appears moderate to severe.  There is preserved more distal left PCA flow.  On the right there is a larger 45 mm segment of loss of flow signal in the distal right P2 segment.  More distal PCA flow is attenuated beyond this point.  Diminutive right posterior communicating artery.  Antegrade flow in both ICA siphons.  Cavernous and supraclinoid segment irregularity, but no hemodynamically significant ICA stenosis.  Ophthalmic artery origins are within normal limits. Small infundibulum of the distal left ICA probably related to the anterior choroidal artery origin.  The left posterior communicating artery is diminutive or absent.  Normal right posterior communicating artery origin.  Carotid termini are patent.  ACA origins are within normal limits.  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  Other visualized ACA branches are within normal limits.  MCA origins are patent.  Both M1 segment are regular, more so the right.  There is a mild proximal left MCA M1 stenosis.  There is a mild mid right M1 segment stenosis.  Left MCA trifurcation and right MCA bifurcation are patent.  Minimal to mild irregularity in the visualized MCA branches.  IMPRESSION: 1.  High-grade stenosis or focal near occlusion of the right PCA distal  P2 segment with attenuated more distal right PCA flow. 2.  Moderate to severe focal stenosis of the left PCA P2 segment. 3.  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 4.  Mild to moderate anterior circulation atherosclerosis, most pronounced in the MCA M1 segments.  Study discussed by telephone with Dr. Chaney Malling on 05/18/2012 at 1350 hours.   Original Report Authenticated By: Erskine Speed, M.D.     Microbiology: No results found for this or any previous visit (from the past 240 hour(s)).   Labs: Basic Metabolic Panel:  Lab 05/18/12 1324  NA 138  K 4.1  CL 103  CO2 24  GLUCOSE 145*  BUN 13    CREATININE 1.11  CALCIUM 9.1  MG --  PHOS --   Liver Function Tests:  Lab 05/18/12 1029  AST 28  ALT 27  ALKPHOS 54  BILITOT 0.3  PROT 7.0  ALBUMIN 3.7   No results found for this basename: LIPASE:5,AMYLASE:5 in the last 168 hours No results found for this basename: AMMONIA:5 in the last 168 hours CBC:  Lab 05/18/12 1029  WBC 6.9  NEUTROABS 3.9  HGB 14.4  HCT 41.7  MCV 89.3  PLT 194   Cardiac Enzymes:  Lab 05/18/12 1029  CKTOTAL --  CKMB --  CKMBINDEX --  TROPONINI <0.30   BNP: BNP (last 3 results) No results found for this basename: PROBNP:3 in the last 8760 hours CBG: No results found for this basename: GLUCAP:5 in the last 168 hours     Signed:  Ranell Skibinski  [facility] 05/20/2012, 7:52 AM

## 2012-05-21 ENCOUNTER — Emergency Department (HOSPITAL_COMMUNITY): Payer: Medicare Other

## 2012-05-21 ENCOUNTER — Encounter (HOSPITAL_COMMUNITY): Payer: Self-pay

## 2012-05-21 ENCOUNTER — Other Ambulatory Visit: Payer: Self-pay

## 2012-05-21 ENCOUNTER — Observation Stay (HOSPITAL_COMMUNITY)
Admission: EM | Admit: 2012-05-21 | Discharge: 2012-05-22 | Disposition: A | Discharge status: Home / Self Care | Payer: Medicare Other | Attending: Family Medicine | Admitting: Family Medicine

## 2012-05-21 DIAGNOSIS — G459 Transient cerebral ischemic attack, unspecified: Secondary | ICD-10-CM | POA: Diagnosis not present

## 2012-05-21 DIAGNOSIS — R209 Unspecified disturbances of skin sensation: Secondary | ICD-10-CM | POA: Diagnosis not present

## 2012-05-21 DIAGNOSIS — I658 Occlusion and stenosis of other precerebral arteries: Secondary | ICD-10-CM

## 2012-05-21 DIAGNOSIS — Z7902 Long term (current) use of antithrombotics/antiplatelets: Secondary | ICD-10-CM | POA: Insufficient documentation

## 2012-05-21 DIAGNOSIS — Z79899 Other long term (current) drug therapy: Secondary | ICD-10-CM | POA: Diagnosis not present

## 2012-05-21 DIAGNOSIS — R202 Paresthesia of skin: Secondary | ICD-10-CM

## 2012-05-21 DIAGNOSIS — E785 Hyperlipidemia, unspecified: Secondary | ICD-10-CM | POA: Diagnosis not present

## 2012-05-21 DIAGNOSIS — I6320 Cerebral infarction due to unspecified occlusion or stenosis of unspecified precerebral arteries: Secondary | ICD-10-CM | POA: Diagnosis not present

## 2012-05-21 DIAGNOSIS — I10 Essential (primary) hypertension: Secondary | ICD-10-CM | POA: Diagnosis not present

## 2012-05-21 DIAGNOSIS — I63239 Cerebral infarction due to unspecified occlusion or stenosis of unspecified carotid arteries: Secondary | ICD-10-CM | POA: Diagnosis not present

## 2012-05-21 DIAGNOSIS — I639 Cerebral infarction, unspecified: Secondary | ICD-10-CM

## 2012-05-21 DIAGNOSIS — Z8673 Personal history of transient ischemic attack (TIA), and cerebral infarction without residual deficits: Secondary | ICD-10-CM | POA: Diagnosis present

## 2012-05-21 DIAGNOSIS — Z7982 Long term (current) use of aspirin: Secondary | ICD-10-CM | POA: Diagnosis not present

## 2012-05-21 DIAGNOSIS — Z9849 Cataract extraction status, unspecified eye: Secondary | ICD-10-CM

## 2012-05-21 DIAGNOSIS — I6523 Occlusion and stenosis of bilateral carotid arteries: Secondary | ICD-10-CM | POA: Diagnosis present

## 2012-05-21 HISTORY — DX: Cerebral infarction, unspecified: I63.9

## 2012-05-21 LAB — CBC
Platelets: 185 10*3/uL (ref 150–400)
RDW: 12.8 % (ref 11.5–15.5)
WBC: 8.2 10*3/uL (ref 4.0–10.5)

## 2012-05-21 LAB — BASIC METABOLIC PANEL
Calcium: 9.4 mg/dL (ref 8.4–10.5)
GFR calc non Af Amer: 66 mL/min — ABNORMAL LOW (ref >90)
Sodium: 137 mEq/L (ref 135–145)

## 2012-05-21 LAB — GLUCOSE, CAPILLARY: Glucose-Capillary: 102 mg/dL — ABNORMAL HIGH (ref 70–99)

## 2012-05-21 MED ORDER — ATORVASTATIN CALCIUM 10 MG PO TABS
10.0000 mg | ORAL_TABLET | Freq: Every day | ORAL | Status: DC
  Administered 2012-05-21 – 2012-05-22 (×2): 10 mg via ORAL
  Filled 2012-05-21 (×3): qty 1

## 2012-05-21 MED ORDER — ACETAMINOPHEN 500 MG PO TABS
1000.0000 mg | ORAL_TABLET | Freq: Four times a day (QID) | ORAL | Status: DC | PRN
  Filled 2012-05-21: qty 2

## 2012-05-21 MED ORDER — AMLODIPINE BESYLATE 10 MG PO TABS
10.0000 mg | ORAL_TABLET | Freq: Every day | ORAL | Status: DC
  Administered 2012-05-21 – 2012-05-22 (×2): 10 mg via ORAL
  Filled 2012-05-21 (×2): qty 1

## 2012-05-21 MED ORDER — HYDRALAZINE HCL 20 MG/ML IJ SOLN
10.0000 mg | INTRAMUSCULAR | Status: DC | PRN
  Administered 2012-05-21: 18:00:00 via INTRAVENOUS
  Administered 2012-05-22: 10 mg via INTRAVENOUS
  Filled 2012-05-21: qty 1
  Filled 2012-05-21: qty 0.5

## 2012-05-21 MED ORDER — HYDRALAZINE HCL 20 MG/ML IJ SOLN
10.0000 mg | Freq: Once | INTRAMUSCULAR | Status: AC
  Administered 2012-05-21: 10 mg via INTRAVENOUS
  Filled 2012-05-21: qty 1

## 2012-05-21 MED ORDER — ENOXAPARIN SODIUM 40 MG/0.4ML ~~LOC~~ SOLN
40.0000 mg | SUBCUTANEOUS | Status: DC
  Administered 2012-05-21: 40 mg via SUBCUTANEOUS
  Filled 2012-05-21 (×2): qty 0.4

## 2012-05-21 MED ORDER — ASPIRIN 325 MG PO TABS
325.0000 mg | ORAL_TABLET | Freq: Every evening | ORAL | Status: DC
  Administered 2012-05-21 – 2012-05-22 (×2): 325 mg via ORAL
  Filled 2012-05-21 (×2): qty 1

## 2012-05-21 MED ORDER — LISINOPRIL 10 MG PO TABS
10.0000 mg | ORAL_TABLET | Freq: Every day | ORAL | Status: DC
  Administered 2012-05-21 – 2012-05-22 (×2): 10 mg via ORAL
  Filled 2012-05-21 (×2): qty 1

## 2012-05-21 NOTE — Consult Note (Addendum)
Reason for Consult:Recent Right CVA Referring Physician: Patria Mane MD  CC: Left sided numbness and tingling  HPI: Craig Avery is a very pleasant 71 yo male discharged from [facility] yesterday following an admission for a right CVA. The pts initial presentation was that of left sided tingling which occurred while he was at work. He was noted to be hypertensive. He was found to have an acute stroke by MRI as well as carotid artery disease 80% left and 50% right.   The patient had no primary care MD prior to admission.Marland Kitchen He was discharged on aspirin, anti htn meds, and a lipid lowering medication with plans for outpatient follow up with primary care.Last evening he noted mild left sided tingling. This AM he noted increased tingling of both his left arm and leg. He denied any weakness, dizziness, speech, or visual problems. His daughter checked his BP and the systolic was approximately 220 mmHg. The patient presented to the [facility] ED today where his BP was treated and his tingling seems to be improving. The plan is for re admission.  Past Medical History  Diagnosis Date  . Thyroid disease   . Stroke   Hypertension  Past Surgical History  Procedure Date  . Eye surgery     Family History - Mother deceased in her 79s - natural causes. Father deceased in his 9s - cirrhosis. Brother deceased from MI in his early 61s.  Social History:  reports that he quit smoking about 17 years ago. His smoking use included Cigarettes. He has a 49.5 pack-year smoking history. He has never used smokeless tobacco. He reports that he drinks about 4.8 ounces of alcohol per week. He reports that he does not use illicit drugs.  No Known Allergies  Medications:  . ACETAMINOPHEN 500 MG PO TABS Oral Take 1,000 mg by mouth every 6 (six) hours as needed. For pain  . AMLODIPINE BESYLATE 5 MG PO TABS Oral Take 1 tablet (5 mg total) by mouth daily.  . ASPIRIN 325 MG PO TABS Oral Take 325 mg by mouth every evening.  .  ATORVASTATIN CALCIUM 10 MG PO TABS Oral Take 1 tablet (10 mg total) by mouth daily at 6 PM.  . LISINOPRIL 5 MG PO TABS Oral Take 1 tablet (5 mg total) by mouth daily.   ROS:  General ROS: negative for - chills, fatigue, fever, night sweats, weight gain or weight loss Psychological ROS: negative for - behavioral disorder, hallucinations, memory difficulties, mood swings or suicidal ideation Ophthalmic ROS: negative for - blurry vision, double vision, eye pain or loss of vision ENT ROS: negative for - epistaxis, nasal discharge, oral lesions, sore throat, tinnitus or vertigo Allergy and Immunology ROS: negative for - hives or itchy/watery eyes Hematological and Lymphatic ROS: negative for - bleeding problems, bruising or swollen lymph nodes Endocrine ROS: negative for - galactorrhea, hair pattern changes, polydipsia/polyuria or temperature intolerance Respiratory ROS: negative for - cough, hemoptysis, shortness of breath or wheezing Cardiovascular ROS: negative for - chest pain, dyspnea on exertion, edema or irregular heartbeat Gastrointestinal ROS: negative for - abdominal pain, diarrhea, hematemesis, nausea/vomiting or stool incontinence Genito-Urinary ROS: negative for - dysuria, hematuria, incontinence or urinary frequency/urgency Musculoskeletal ROS: negative for - joint swelling or muscular weakness Neurological ROS: as noted in HPI Dermatological ROS: negative for rash and skin lesion changes  Physical Examination: Blood pressure 214/83, pulse 59, temperature 97.5 F (36.4 C), temperature source Oral, resp. rate 15, height 5\' 3"  (1.6 m), weight 91.627 kg (202  lb), SpO2 98.00%.  Neurologic Examination Mental Status: Alert, oriented, thought content appropriate.  Speech fluent without evidence of aphasia.  Able to follow 3 step commands without difficulty. Cranial Nerves: II: Discs flat bilaterally; Visual fields grossly normal, pupils equal, round, reactive to light and  accommodation III,IV, VI: ptosis not present, extra-ocular motions intact bilaterally V,VII: smile symmetric, facial light touch sensation normal bilaterally VIII: hearing normal bilaterally IX,X: gag reflex present XI: bilateral shoulder shrug XII: midline tongue extension Motor: Right : Upper extremity   5/5    Left:     Upper extremity   5/5  Lower extremity   5/5     Lower extremity   5/5 Tone and bulk:normal tone throughout; no atrophy noted Sensory:  light touch intact throughout, bilaterally Deep Tendon Reflexes: 1+ and symmetric throughout Plantars: Right: downgoing   Left: downgoing Cerebellar: normal finger-to-nose, normal rapid alternating movements and normal heel-to-shin test Gait: did not ambulate patient CV: distal pulses weak to absent  Laboratory Studies:   Basic Metabolic Panel:  Lab 05/21/12 7846 05/18/12 1029  NA 137 138  K 3.9 4.1  CL 103 103  CO2 23 24  GLUCOSE 113* 145*  BUN 20 13  CREATININE 1.08 1.11  CALCIUM 9.4 9.1  MG -- --  PHOS -- --    Liver Function Tests:  Lab 05/18/12 1029  AST 28  ALT 27  ALKPHOS 54  BILITOT 0.3  PROT 7.0  ALBUMIN 3.7   No results found for this basename: LIPASE:5,AMYLASE:5 in the last 168 hours No results found for this basename: AMMONIA:3 in the last 168 hours  CBC:  Lab 05/21/12 1429 05/18/12 1029  WBC 8.2 6.9  NEUTROABS -- 3.9  HGB 15.0 14.4  HCT 42.2 41.7  MCV 89.2 89.3  PLT 185 194    Cardiac Enzymes:  Lab 05/18/12 1029  CKTOTAL --  CKMB --  CKMBINDEX --  TROPONINI <0.30    BNP: No components found with this basename: POCBNP:5  CBG:  Lab 05/21/12 1226  GLUCAP 102*    Microbiology: No results found for this or any previous visit.  Coagulation Studies: No results found for this basename: LABPROT:5,INR:5 in the last 72 hours  Urinalysis:  Lab 05/18/12 1057  COLORURINE YELLOW  LABSPEC 1.009  PHURINE 6.5  GLUCOSEU NEGATIVE  HGBUR NEGATIVE  BILIRUBINUR NEGATIVE  KETONESUR  NEGATIVE  PROTEINUR NEGATIVE  UROBILINOGEN 0.2  NITRITE NEGATIVE  LEUKOCYTESUR NEGATIVE    Lipid Panel:     Component Value Date/Time   CHOL 196 05/19/2012 0635   TRIG 168* 05/19/2012 0635   HDL 45 05/19/2012 0635   CHOLHDL 4.4 05/19/2012 0635   VLDL 34 05/19/2012 0635   LDLCALC 117* 05/19/2012 0635    HgbA1C:  Lab Results  Component Value Date   HGBA1C 5.4 05/19/2012    Urine Drug Screen:   No results found for this basename: labopia, cocainscrnur, labbenz, amphetmu, thcu, labbarb    Alcohol Level: No results found for this basename: ETH:2 in the last 168 hours  Imaging: Ct Head Wo Contrast  05/21/2012  *RADIOLOGY REPORT*  Clinical Data: New numbness and tingling in the left side.  Recent strokes.  CT HEAD WITHOUT CONTRAST  Technique:  Contiguous axial images were obtained from the base of the skull through the vertex without contrast.  Comparison: 05/18/2012  Findings: Previously observed subacute right occipital lobe stroke is visible as a 1.1 cm hypodensity on image 13 of series 2 of today's exam.  The previous subacute  stroke along the right hippocampus is not readily apparent on CT.  Remote lacunar infarcts of the head of the left caudate nucleus noted.  The cerebellum, brain stem, thalami, and right basal ganglia appear unremarkable.  Ventricular system and basilar cisterns normal.  No interval new stroke observed.  No intracranial hemorrhage or mass lesion noted.  The visualized paranasal sinuses appear clear.  IMPRESSION:  1.  No new/interval findings to explain the patient's new left- sided symptoms.  The known right occipital lobe subacute infarct appears similar to prior; the known right hippocampal infarct is not readily apparent on CT. 2.  Remote lacunar infarct of the left caudate head.   Original Report Authenticated By: Gaylyn Rong, M.D.     Delton See PA-C [facility] Pager [phone] 05/21/2012, 4:47 PM  Patient seen and examined.  Clinical  course and management discussed.  Necessary edits performed.  I agree with the above.  Assessment and plan of care developed and discussed below.    Assessment: 71 year old male recently discharged from a admission for acute infarct.  Patient was placed on ASA.  Initial presentation was with left sided numbness that resolved.  Symptoms today were of left sided numbness as well that again resolved.  Patient is hypertensive.  Work up from previous hospitalization includes a @D  echo that showed no intracardiac masses or thrombi.  Carotid doppler showed 40-59% stenosis of the RICA and greater than 80% stenosis of the left ICA.Marland Kitchen  MRA Showed bilateral PCA stenosis.  MRI revealed small acute infarcts in the right hippocampus and right occipital lobe. Patient was scheduled for a TEE as an outpatient.    Plan: 1.  TEE to be scheduled for tomorrow 2.  MRI of the brain to rule out other acute events.  3.  Continue on ASA.  Would change antiplatelet therapy if further acute events noted on MR imaging.    4.  BP control   Thana Farr, MD [facility] [phone]  05/21/2012  5:13 PM

## 2012-05-21 NOTE — ED Notes (Signed)
Dr. Eather Colas of elevated bp and wants pt. To go to floor. Pt. Asymptomatic.

## 2012-05-21 NOTE — H&P (Signed)
[facility] History and Physical  Craig Avery ZOX:[id number] DOB: 1941/03/13 DOA: 05/21/2012  Referring physician: EDP PCP: Patient was referred to Corinda Gubler at [facility]    Chief Complaint:  Chief Complaint  Patient presents with  . Numbness     HPI: Craig Avery is a 71 y.o. male who had a recent right hippocampus and right occipital lobe, implicating right PCA territory ischemia. He was also diagnosed with bilat carotid artery stenoses, HL and HTN. He was [facility] home with plans for f/u with neurology and primary care. Today he presents back with left side numbness and BP elevated above 200s. He is placed on observation for concerns related to possible TIA.    Review of Systems: The patient denies anorexia, fever, weight loss,, vision loss, decreased hearing, hoarseness, chest pain, syncope, dyspnea on exertion, peripheral edema, balance deficits, hemoptysis, abdominal pain, melena, hematochezia, severe indigestion/heartburn, hematuria, incontinence, genital sores, muscle weakness, suspicious skin lesions, transient blindness, difficulty walking, depression, unusual weight change, abnormal bleeding, enlarged lymph nodes, angioedema,   Past Medical History  Diagnosis Date  . Thyroid disease   . Stroke    Past Surgical History  Procedure Date  . Eye surgery    Social History:  reports that he quit smoking about 17 years ago. His smoking use included Cigarettes. He has a 49.5 pack-year smoking history. He has never used smokeless tobacco. He reports that he drinks about 4.8 ounces of alcohol per week. He reports that he does not use illicit drugs.   No Known Allergies  Fhx - negative for CAD, strokes,   Prior to Admission medications   Medication Sig Start Date End Date Taking? Authorizing Provider  acetaminophen (TYLENOL) 500 MG tablet Take 1,000 mg by mouth every 6 (six) hours as needed. For pain   Yes Historical Provider, MD  amLODipine (NORVASC) 5 MG tablet Take  1 tablet (5 mg total) by mouth daily. 05/20/12  Yes Belkys Henault Luanne Bras, MD  aspirin 325 MG tablet Take 325 mg by mouth every evening. 05/20/12  Yes Aja Whitehair Luanne Bras, MD  atorvastatin (LIPITOR) 10 MG tablet Take 1 tablet (10 mg total) by mouth daily at 6 PM. 05/20/12  Yes Shauntay Brunelli Luanne Bras, MD  lisinopril (PRINIVIL,ZESTRIL) 5 MG tablet Take 1 tablet (5 mg total) by mouth daily. 05/20/12  Yes Yamilet Mcfayden Luanne Bras, MD   Physical Exam: Filed Vitals:   05/21/12 1502 05/21/12 1558 05/21/12 1603 05/21/12 1610  BP: 198/72 196/62 214/83   Pulse: 55  59   Temp:    97.5 F (36.4 C)  TempSrc:      Resp: 18  15   Height:      Weight:      SpO2: 96%  98%    General: Alert and oriented x3, normocephalic atraumatic, in no acute respiratory distress, calm and cooperative, alopecia  Eyes: Pupil equal round react to light accommodation, extraocular movement is intact  ENT: Clear pharynx, normal appearing external ears  Neck: No thyromegaly no jugular venous distention  Cardiovascular: Regular rate and rhythm, bradycardic, no murmurs rubs or gallops  Respiratory: Clear to auscultation bilaterally without wheeze rhonchi crackles  Abdomen: Soft nontender nondistended, bowel sounds are present  Skin: Warm dry without rashes  Musculoskeletal: No significant muscle atrophy,  Psychiatric: Euthymic  Neurologic: Cranial nerves 2-12 intact, 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, strength 5 out of 5 in all 4 extremities, sensation intact, finger to nose intact, deep tendon reflexes +2 and symmetric, plantars downgoing   Labs  on Admission:  Basic Metabolic Panel:  Lab 05/21/12 4540 05/18/12 1029  NA 137 138  K 3.9 4.1  CL 103 103  CO2 23 24  GLUCOSE 113* 145*  BUN 20 13  CREATININE 1.08 1.11  CALCIUM 9.4 9.1  MG -- --  PHOS -- --   Liver Function Tests:  Lab 05/18/12 1029  AST 28  ALT 27  ALKPHOS 54  BILITOT 0.3  PROT 7.0  ALBUMIN 3.7   No results found for this basename: LIPASE:5,AMYLASE:5 in the last 168  hours No results found for this basename: AMMONIA:5 in the last 168 hours CBC:  Lab 05/21/12 1429 05/18/12 1029  WBC 8.2 6.9  NEUTROABS -- 3.9  HGB 15.0 14.4  HCT 42.2 41.7  MCV 89.2 89.3  PLT 185 194   Cardiac Enzymes:  Lab 05/18/12 1029  CKTOTAL --  CKMB --  CKMBINDEX --  TROPONINI <0.30    BNP (last 3 results) No results found for this basename: PROBNP:3 in the last 8760 hours CBG:  Lab 05/21/12 1226  GLUCAP 102*    Radiological Exams on Admission: Ct Head Wo Contrast  05/21/2012  *RADIOLOGY REPORT*  Clinical Data: New numbness and tingling in the left side.  Recent strokes.  CT HEAD WITHOUT CONTRAST  Technique:  Contiguous axial images were obtained from the base of the skull through the vertex without contrast.  Comparison: 05/18/2012  Findings: Previously observed subacute right occipital lobe stroke is visible as a 1.1 cm hypodensity on image 13 of series 2 of today's exam.  The previous subacute stroke along the right hippocampus is not readily apparent on CT.  Remote lacunar infarcts of the head of the left caudate nucleus noted.  The cerebellum, brain stem, thalami, and right basal ganglia appear unremarkable.  Ventricular system and basilar cisterns normal.  No interval new stroke observed.  No intracranial hemorrhage or mass lesion noted.  The visualized paranasal sinuses appear clear.  IMPRESSION:  1.  No new/interval findings to explain the patient's new left- sided symptoms.  The known right occipital lobe subacute infarct appears similar to prior; the known right hippocampal infarct is not readily apparent on CT. 2.  Remote lacunar infarct of the left caudate head.   Original Report Authenticated By: Gaylyn Rong, M.D.       Assessment/Plan Principal Problem:  *TIA (transient ischemic attack) Active Problems:  HTN (hypertension)  Stroke  Carotid stenosis, bilateral  S/P cataract surgery  Hyperlipidemia   1. TIA - c/w aspirin . Neuro consult called    2. HTN urgency - will aim at lowering the BP to goal SBP<180. Plan to increase amlodipine to 10 mg daily, lisinopril 10 mg daily and hydralazine prn  3. Recent posterior circulation stroke - TEE either inpatient or outpatient  4. Bilat carotid artery stenoses - debatable if they are symptomatic 5. HL - c/w statin   Code Status: full  Family Communication: daughters  Disposition Plan: home   Time spent: 45 minutes  Ticia Virgo [facility] Pager [phone]  If 7PM-7AM, please contact night-coverage [email or web address] Password [facility] 05/21/2012, 4:56 PM

## 2012-05-21 NOTE — ED Notes (Signed)
Pt. Was discharged yesterday.  Was in the hospital for numerous strokes.   Pt. Woke up this am with tingling and numbness to his lt. Side.  Speech is clear, Pt. Is alert and oriented X3. Pt. Denies any pain or discomfort.  No neuro deficits noted.

## 2012-05-21 NOTE — ED Notes (Signed)
Pt brought to room from triage via wheelchair; pt undressed, in gown, on monitor, continuous pulse oximetry and blood pressure cuff

## 2012-05-21 NOTE — ED Notes (Signed)
MD at bedside. Aware of hypertension. Pt. Will go to floor once bp comes down toward parameters.

## 2012-05-21 NOTE — ED Notes (Signed)
Heart Healthy diet ordered.  

## 2012-05-21 NOTE — ED Provider Notes (Signed)
History     CSN: [id number]  Arrival date & time 05/21/12  1153   First MD Initiated Contact with Patient 05/21/12 1325      Chief Complaint  Patient presents with  . Numbness    (Consider location/radiation/quality/duration/timing/severity/associated sxs/prior treatment) The history is provided by the patient and medical records.   patient was discharged from the hospital yesterday at 10 AM after diagnosis of an acute posterior stroke resulting in left-sided numbness and tingling.  At that time he is also noted to be hypertensive and was started on Norvasc and lisinopril.  He reports taking doses of both medications last night as well as this morning.  This morning he awoke with a repeat feeling of the tingling and numbness of the left side of his body this concerned him and thus he was brought to the emergency apartment for evaluation.  On arrival to the ER his blood pressure is 204/75.  He denies any new weakness or difficulty with his speech.  He has no new headache.  At the time my evaluation he had are ready undergone a repeat head CT which demonstrated no acute abnormalities.  Prior to the patient's hospitalization he did not have a known history of high cholesterol or hypertension.  That time he did not have a primary care physician.  He underwent a normal stroke workup in the hospital which included diagnoses of 80% stenosis of the left carotid artery and 50% stenosis of his right carotid artery.  His MRI was positive for an acute stroke.  Past Medical History  Diagnosis Date  . Thyroid disease   . Stroke     Past Surgical History  Procedure Date  . Eye surgery     No family history on file.  History  Substance Use Topics  . Smoking status: Former Smoker -- 1.5 packs/day for 33 years    Types: Cigarettes    Quit date: 06/18/1994  . Smokeless tobacco: Never Used  . Alcohol Use: 4.8 oz/week    8 Cans of beer per week      Review of Systems  All other systems  reviewed and are negative.    Allergies  Review of patient's allergies indicates no known allergies.  Home Medications   Current Outpatient Rx  Name  Route  Sig  Dispense  Refill  . ACETAMINOPHEN 500 MG PO TABS   Oral   Take 1,000 mg by mouth every 6 (six) hours as needed. For pain         . AMLODIPINE BESYLATE 5 MG PO TABS   Oral   Take 1 tablet (5 mg total) by mouth daily.   30 tablet   0   . ASPIRIN 325 MG PO TABS   Oral   Take 325 mg by mouth every evening.         . ATORVASTATIN CALCIUM 10 MG PO TABS   Oral   Take 1 tablet (10 mg total) by mouth daily at 6 PM.   30 tablet   0   . LISINOPRIL 5 MG PO TABS   Oral   Take 1 tablet (5 mg total) by mouth daily.   30 tablet   0     BP 174/74  Pulse 53  Temp 97.5 F (36.4 C) (Oral)  Resp 16  Ht 5\' 3"  (1.6 m)  Wt 202 lb (91.627 kg)  BMI 35.78 kg/m2  SpO2 97%  Physical Exam  Nursing note and vitals reviewed. Constitutional: He is oriented  to person, place, and time. He appears well-developed and well-nourished.  HENT:  Head: Normocephalic and atraumatic.  Eyes: EOM are normal. Pupils are equal, round, and reactive to light.  Neck: Normal range of motion.  Cardiovascular: Normal rate, regular rhythm, normal heart sounds and intact distal pulses.   Pulmonary/Chest: Effort normal and breath sounds normal. No respiratory distress.  Abdominal: Soft. He exhibits no distension. There is no tenderness.  Musculoskeletal: Normal range of motion.  Neurological: He is alert and oriented to person, place, and time.       5/5 strength in major muscle groups of  bilateral upper and lower extremities. Speech normal. No facial asymetry.   Skin: Skin is warm and dry.  Psychiatric: He has a normal mood and affect. Judgment normal.    ED Course  Procedures (including critical care time)  Labs Reviewed  BASIC METABOLIC PANEL - Abnormal; Notable for the following:    Glucose, Bld 113 (*)     GFR calc non Af Amer 66 (*)      GFR calc Af Amer 77 (*)     All other components within normal limits  GLUCOSE, CAPILLARY - Abnormal; Notable for the following:    Glucose-Capillary 102 (*)     All other components within normal limits   Ct Head Wo Contrast  05/21/2012  *RADIOLOGY REPORT*  Clinical Data: New numbness and tingling in the left side.  Recent strokes.  CT HEAD WITHOUT CONTRAST  Technique:  Contiguous axial images were obtained from the base of the skull through the vertex without contrast.  Comparison: 05/18/2012  Findings: Previously observed subacute right occipital lobe stroke is visible as a 1.1 cm hypodensity on image 13 of series 2 of today's exam.  The previous subacute stroke along the right hippocampus is not readily apparent on CT.  Remote lacunar infarcts of the head of the left caudate nucleus noted.  The cerebellum, brain stem, thalami, and right basal ganglia appear unremarkable.  Ventricular system and basilar cisterns normal.  No interval new stroke observed.  No intracranial hemorrhage or mass lesion noted.  The visualized paranasal sinuses appear clear.  IMPRESSION:  1.  No new/interval findings to explain the patient's new left- sided symptoms.  The known right occipital lobe subacute infarct appears similar to prior; the known right hippocampal infarct is not readily apparent on CT. 2.  Remote lacunar infarct of the left caudate head.   Original Report Authenticated By: Gaylyn Rong, M.D.    I personally reviewed the imaging tests through PACS system I reviewed available ER/hospitalization records through the EMR   1. Hypertension   2. Tingling       MDM  Given the patient's elevated blood pressures in his recurrent symptoms last the hospitalist to reevaluate the patient the bedside.  I would suspect that with recurrent symptoms he would benefit from repeat admission, evaluation, and more importantly observation.  His blood pressure will need to be addressed  Hospitalist: Dr  Lavera Guise Neurohospitalist: Dr Thad Ranger        Lyanne Co, MD 05/21/12 [phone]

## 2012-05-22 ENCOUNTER — Observation Stay (HOSPITAL_COMMUNITY): Payer: Medicare Other

## 2012-05-22 ENCOUNTER — Encounter (HOSPITAL_COMMUNITY)
Admission: EM | Disposition: A | Discharge status: Home / Self Care | Payer: Self-pay | Source: Home / Self Care | Attending: Emergency Medicine

## 2012-05-22 ENCOUNTER — Encounter (HOSPITAL_COMMUNITY): Payer: Self-pay

## 2012-05-22 DIAGNOSIS — I6350 Cerebral infarction due to unspecified occlusion or stenosis of unspecified cerebral artery: Secondary | ICD-10-CM | POA: Diagnosis not present

## 2012-05-22 DIAGNOSIS — I658 Occlusion and stenosis of other precerebral arteries: Secondary | ICD-10-CM | POA: Diagnosis not present

## 2012-05-22 DIAGNOSIS — I10 Essential (primary) hypertension: Secondary | ICD-10-CM | POA: Diagnosis not present

## 2012-05-22 DIAGNOSIS — E785 Hyperlipidemia, unspecified: Secondary | ICD-10-CM | POA: Diagnosis not present

## 2012-05-22 DIAGNOSIS — I6789 Other cerebrovascular disease: Secondary | ICD-10-CM

## 2012-05-22 HISTORY — PX: TEE WITHOUT CARDIOVERSION: SHX5443

## 2012-05-22 SURGERY — ECHOCARDIOGRAM, TRANSESOPHAGEAL
Anesthesia: Moderate Sedation

## 2012-05-22 MED ORDER — DIPHENHYDRAMINE HCL 50 MG/ML IJ SOLN
INTRAMUSCULAR | Status: AC
  Filled 2012-05-22: qty 1

## 2012-05-22 MED ORDER — MIDAZOLAM HCL 5 MG/ML IJ SOLN
INTRAMUSCULAR | Status: AC
  Filled 2012-05-22: qty 3

## 2012-05-22 MED ORDER — AMLODIPINE BESYLATE 10 MG PO TABS: 10.0000 mg | ORAL_TABLET | Freq: Every day | ORAL | Status: DC

## 2012-05-22 MED ORDER — SODIUM CHLORIDE 0.9 % IV SOLN: INTRAVENOUS | Status: DC

## 2012-05-22 MED ORDER — MIDAZOLAM HCL 10 MG/2ML IJ SOLN
INTRAMUSCULAR | Status: DC | PRN
  Administered 2012-05-22 (×2): 2 mg via INTRAVENOUS

## 2012-05-22 MED ORDER — BUTAMBEN-TETRACAINE-BENZOCAINE 2-2-14 % EX AERO
INHALATION_SPRAY | CUTANEOUS | Status: DC | PRN
  Administered 2012-05-22: 2 via TOPICAL

## 2012-05-22 MED ORDER — LISINOPRIL 10 MG PO TABS: 10.0000 mg | ORAL_TABLET | Freq: Every day | ORAL | Status: DC

## 2012-05-22 MED ORDER — FENTANYL CITRATE 0.05 MG/ML IJ SOLN
INTRAMUSCULAR | Status: DC | PRN
  Administered 2012-05-22: 25 ug via INTRAVENOUS

## 2012-05-22 MED ORDER — FENTANYL CITRATE 0.05 MG/ML IJ SOLN
INTRAMUSCULAR | Status: AC
  Filled 2012-05-22: qty 4

## 2012-05-22 NOTE — Progress Notes (Signed)
Utilization review completed. Nature Vogelsang, RN, BSN. 

## 2012-05-22 NOTE — Progress Notes (Signed)
Stroke Team Progress Note  HISTORY  Craig Avery is a very pleasant 71 yo male discharged from [facility] 05/20/12 following an admission for a right  CVA. The pts initial presentation was that of left sided tingling which occurred while he was at work. He was noted to be hypertensive. He was found to have an acute stroke by MRI as well as carotid artery disease 80% left and 50% right.   The patient had no primary care MD prior to admission.Marland Kitchen He was discharged on aspirin, anti htn meds, and a lipid lowering medication with plans for outpatient follow up with primary care. Last evening he noted mild left sided tingling. On am of admit he noted increased tingling of both his left arm and leg. He denied any weakness, dizziness, speech, or visual problems. His daughter checked his BP and the systolic was approximately 220 mmHg. The patient presented to the [facility] ED today where his BP was treated and his tingling seems to be improving. He was readmitted.   Patient was not a TPA candidate secondary to out of window, recent CVA. He was admitted to the neuro floor for further evaluation and treatment.  SUBJECTIVE His wife is at the bedside.  Overall he feels his condition is gradually improving.   OBJECTIVE Most recent Vital Signs: Filed Vitals:   05/21/12 2026 05/21/12 2211 05/22/12 0256 05/22/12 0534  BP: 171/62 174/71 189/84 134/63  Pulse: 84 88 84 87  Temp: 97.5 F (36.4 C) 97.7 F (36.5 C) 98.2 F (36.8 C) 98.1 F (36.7 C)  TempSrc: Oral Oral Oral Oral  Resp: 20 18 20 18   Height:      Weight:      SpO2: 98% 95% 96% 96%   CBG (last 3)   Basename 05/21/12 1226  GLUCAP 102*    IV Fluid Intake:     MEDICATIONS    . amLODipine  10 mg Oral Daily  . aspirin  325 mg Oral QPM  . atorvastatin  10 mg Oral q1800  . enoxaparin  40 mg Subcutaneous Q24H  . [COMPLETED] hydrALAZINE  10 mg Intravenous Once  . lisinopril  10 mg Oral Daily   PRN:  acetaminophen, hydrALAZINE  Diet:   NPO due to TEE Activity:  Activity as tolerated DVT Prophylaxis:  lovenox  CLINICALLY SIGNIFICANT STUDIES Basic Metabolic Panel:  Lab 05/21/12 1610 05/18/12 1029  NA 137 138  K 3.9 4.1  CL 103 103  CO2 23 24  GLUCOSE 113* 145*  BUN 20 13  CREATININE 1.08 1.11  CALCIUM 9.4 9.1  MG -- --  PHOS -- --   Liver Function Tests:  Lab 05/18/12 1029  AST 28  ALT 27  ALKPHOS 54  BILITOT 0.3  PROT 7.0  ALBUMIN 3.7   CBC:  Lab 05/21/12 1429 05/18/12 1029  WBC 8.2 6.9  NEUTROABS -- 3.9  HGB 15.0 14.4  HCT 42.2 41.7  MCV 89.2 89.3  PLT 185 194   Coagulation: No results found for this basename: LABPROT:4,INR:4 in the last 168 hours Cardiac Enzymes:  Lab 05/18/12 1029  CKTOTAL --  CKMB --  CKMBINDEX --  TROPONINI <0.30   Urinalysis:  Lab 05/18/12 1057  COLORURINE YELLOW  LABSPEC 1.009  PHURINE 6.5  GLUCOSEU NEGATIVE  HGBUR NEGATIVE  BILIRUBINUR NEGATIVE  KETONESUR NEGATIVE  PROTEINUR NEGATIVE  UROBILINOGEN 0.2  NITRITE NEGATIVE  LEUKOCYTESUR NEGATIVE   Lipid Panel    Component Value Date/Time   CHOL 196 05/19/2012 0635  TRIG 168* 05/19/2012 0635   HDL 45 05/19/2012 0635   CHOLHDL 4.4 05/19/2012 0635   VLDL 34 05/19/2012 0635   LDLCALC 117* 05/19/2012 0635   HgbA1C  Lab Results  Component Value Date   HGBA1C 5.4 05/19/2012    Ct Head Wo Contrast 05/21/2012  No new/interval findings to explain the patient's new left- sided symptoms.  The known right occipital lobe subacute infarct appears similar to prior; the known right hippocampal infarct is not readily apparent on CT. Remote lacunar infarct of the left caudate head   Mr Brain Wo Contrast 05/22/2012  Interval development of two tiny infarcts inferior mid right temporal lobe and inferior aspect of the posterior limb of the right internal capsule.   EKG  normal sinus rhythm.   TEE: no source of embolus identified.  Therapy Recommendations PT - ; OT - ; ST -   Physical Exam   Pleasant middle-aged  Caucasian gentleman currently not in distress.Awake alert. Afebrile. Head is nontraumatic. Neck is supple without bruit. Hearing is normal. Cardiac exam no murmur or gallop. Lungs are clear to auscultation. Distal pulses are well felt.  Neurological Exam ;  Awake  Alert oriented x 3. Normal speech and language.eye movements full without nystagmus.fundi were not visualized. Vision acuity and fields appear normal. Face symmetric. Tongue midline. Normal strength, tone, reflexes and coordination.mild diminished fine finger movements on the left. Orbits right over left upper rigidity. Mild left hip flexor weakness. Normal sensation. Gait deferred.   ASSESSMENT Mr. Craig Avery is a 71 y.o. male presenting with left sided tingling. Imaging confirms known right occipital subacute infarct.  Work up completed on recent admission except for TEE and outpatient cardiac monitoring. On aspirin 325 mg orally every day prior to admission. Now on aspirin 325 mg orally every day for secondary stroke prevention. Patient with resultant left sided tingling.   Left sided paresthesia  Recent right occipital subacute infarct   Accelerated Hypertension  Bilateral carotid artery stenosis  Hospital day # 1  TREATMENT/PLAN  Continue aspirin 325 mg orally every day for secondary stroke prevention.  TEE today showed no source of embolus. Outpatient cardiac monitoring and follow up with Dr. Pearlean Brownie as scheduled  Stroke team will sign off. I discussed findings with Dr. Sharl Ma.  Guy Franco, [facility],  MBA, MHA [facility] Pager: [phone] 05/22/2012 3:57 PM  Scribe for Dr. Delia Heady, Stroke Center Medical Director. He has personally reviewed chart, pertinent data, examined the patient and developed the plan of care. Pager:  [phone]

## 2012-05-22 NOTE — Interval H&P Note (Signed)
History and Physical Interval Note:  05/22/2012 2:15 PM  Craig Avery  has presented today for surgery, with the diagnosis of CVA  The various methods of treatment have been discussed with the patient and family. After consideration of risks, benefits and other options for treatment, the patient has consented to  Procedure(s) (LRB) with comments: TRANSESOPHAGEAL ECHOCARDIOGRAM (TEE) (N/A) as a surgical intervention .  The patient's history has been reviewed, patient examined, no change in status, stable for surgery.  I have reviewed the patient's chart and labs.  Questions were answered to the patient's satisfaction.     Olga Millers

## 2012-05-22 NOTE — CV Procedure (Signed)
See full TEE report in [employer]; no SOE indentified. Craig Avery

## 2012-05-22 NOTE — Discharge Summary (Signed)
Physician Discharge Summary  Craig Avery ZOX:[id number] DOB: 1941/01/02 DOA: 05/21/2012  PCP: Sheila Oats, MD  Admit date: 05/21/2012 Discharge date: 05/22/2012  Time spent: 45 min minutes  Recommendations for Outpatient Follow-up:  1. Holter monitoring as outpatient  Discharge Diagnoses:  Principal Problem:  *TIA (transient ischemic attack) Active Problems:  HTN (hypertension)  Stroke  Carotid stenosis, bilateral  S/P cataract surgery  Hyperlipidemia   Discharge Condition: Stable  Diet recommendation: Low salt diet  Filed Weights   05/21/12 1201  Weight: 91.627 kg (202 lb)    History of present illness:  Craig Avery is a 71 y.o. male who had a recent right hippocampus and right occipital lobe, implicating right PCA territory ischemia. He was also diagnosed with bilat carotid artery stenoses, HL and HTN. He was [facility] home with plans for f/u with neurology and primary care. Today he presents back with left side numbness and BP elevated above 200s. He is placed on observation for concerns related to possible TIA.   Hospital Course:   CVA Patient's MRI Brain showed Interval development of two tiny infarcts inferior mid right  temporal lobe and inferior aspect of the posterior limb of the  right internal capsule.   Patient underwent TEE , which did not show cardiac source of emboli, and at this time neurology  recommend to continue him on aspirin 325 mg po daily. Outpatient holter monitoring will be set up with [facility]ology.   Hypertension Patient's lisinopril and amlodipine dose was increased, also counseled on low salt diet.  Hyperlipidemia Continue statin.  Carotid artery stenosis Asymptomatic, no intervention at this time per neurology. They will follow as outpatient.  Procedures:  TEE : did not show thrombus i  Consultations:  Neurology  Discharge Exam: Filed Vitals:   05/22/12 1500 05/22/12 1510 05/22/12 1520 05/22/12 1530  BP: 136/62  151/65 156/66 162/57  Pulse:      Temp:      TempSrc:      Resp: 19 19 17 17   Height:      Weight:      SpO2: 98% 99% 96% 97%    General: Appear in no acute distress Cardiovascular: s1s2 RRR Respiratory: Clear bilaterally Ext : No edema  Discharge Instructions  Discharge Orders    Future Orders Please Complete By Expires   Diet - low sodium heart healthy      Increase activity slowly      Discharge instructions      Comments:   Call [facility] cardiology to set up holter  Monitoring as outpatient       Medication List     As of 05/22/2012  3:43 PM    TAKE these medications         acetaminophen 500 MG tablet   Commonly known as: TYLENOL   Take 1,000 mg by mouth every 6 (six) hours as needed. For pain      amLODipine 10 MG tablet   Commonly known as: NORVASC   Take 1 tablet (10 mg total) by mouth daily.      aspirin 325 MG tablet   Take 325 mg by mouth every evening.      atorvastatin 10 MG tablet   Commonly known as: LIPITOR   Take 1 tablet (10 mg total) by mouth daily at 6 PM.      lisinopril 10 MG tablet   Commonly known as: PRINIVIL,ZESTRIL   Take 1 tablet (10 mg total) by mouth daily.  Follow-up Information    Follow up with SETHI,PRAMODKUMAR P, MD. In 2 months.   Contact information:   [address], [address] [facility] [place] [address] [phone]           The results of significant diagnostics from this hospitalization (including imaging, microbiology, ancillary and laboratory) are listed below for reference.    Significant Diagnostic Studies: Ct Head Wo Contrast  05/21/2012  *RADIOLOGY REPORT*  Clinical Data: New numbness and tingling in the left side.  Recent strokes.  CT HEAD WITHOUT CONTRAST  Technique:  Contiguous axial images were obtained from the base of the skull through the vertex without contrast.  Comparison: 05/18/2012  Findings: Previously observed subacute right occipital lobe stroke is  visible as a 1.1 cm hypodensity on image 13 of series 2 of today's exam.  The previous subacute stroke along the right hippocampus is not readily apparent on CT.  Remote lacunar infarcts of the head of the left caudate nucleus noted.  The cerebellum, brain stem, thalami, and right basal ganglia appear unremarkable.  Ventricular system and basilar cisterns normal.  No interval new stroke observed.  No intracranial hemorrhage or mass lesion noted.  The visualized paranasal sinuses appear clear.  IMPRESSION:  1.  No new/interval findings to explain the patient's new left- sided symptoms.  The known right occipital lobe subacute infarct appears similar to prior; the known right hippocampal infarct is not readily apparent on CT. 2.  Remote lacunar infarct of the left caudate head.   Original Report Authenticated By: Gaylyn Rong, M.D.    Mr Brain Wo Contrast  05/22/2012  *RADIOLOGY REPORT*  Clinical Data: Recent stroke.  High blood pressure. Hyperlipidemia.  Left-sided weakness and tingling mainly left leg.  MRI HEAD WITHOUT CONTRAST  Technique:  Multiplanar, multiecho pulse sequences of the brain and surrounding structures were obtained according to standard protocol without intravenous contrast.  Comparison: 05/21/2012 head CT.  05/18/2012 brain MR.  Findings: Previously noted right hippocampal and right occipital lobe infarct once again noted.  Interval development of two tiny infarcts inferior mid right temporal lobe and inferior aspect of the posterior limb of the right internal capsule.  Remote small infarcts left caudate.  No intracranial hemorrhage.  Small vessel disease type changes.  Question 2 mm right pituitary lesion?  Otherwise no evidence of intracranial mass noted on this unenhanced exam.  No hydrocephalus.  Cerebellar tonsils minimally low-lying but within the range normal limits.  Major intracranial vascular structures are patent.  Partial opacification left mastoid air cells without  obstructing lesion noted in the posterior-superior nasopharynx causing eustachian tube dysfunction.  Minimal mucosal thickening ethmoid sinus air cells.  IMPRESSION: Interval development of two tiny infarcts inferior mid right temporal lobe and inferior aspect of the posterior limb of the right internal capsule.  Please see above for additional findings.  This has been made a PRA call report utilizing dashboard call feature.   Original Report Authenticated By: Lacy Duverney, M.D.    Mr Brain Wo Contrast  05/18/2012  *RADIOLOGY REPORT*  Clinical Data:  71 year old male with left side numbness and tingling.  Hypertension.  Comparison: None.  MRI HEAD WITHOUT CONTRAST  Technique: Multiplanar, multiecho pulse sequences of the brain and surrounding structures were obtained according to standard protocol without intravenous contrast.  Findings: There is an 11 mm globular area of restricted diffusion in the right occipital pole mostly affecting white matter (series 3 image 11).  There is also restricted diffusion in the body  of the right hippocampus (series 3 image 13).  No other definite diffusion abnormality.  These areas show T2 and FLAIR hyperintensity without mass effect or hemorrhage.  Major intracranial vascular flow voids are preserved.  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  Mild involvement of the left lentiform nuclei.  Possible tiny chronic lacunar infarct in the medial left thalamus.  Other deep gray matter nuclei within normal limits.  Probable inconsequential dilated perivascular spaces in the mid brain. Brainstem, cerebellum, and visualized cervical spine are within normal limits.  No cerebral cortical encephalomalacia.  Negative pituitary. No midline shift, mass effect, or evidence of mass lesion.  No ventriculomegaly.  Normal marrow signal.  Postoperative changes to the globes. Scattered left mastoid air cell fluid.  Negative visualized nasopharynx.  Other Visualized  paranasal sinuses and mastoids are clear.  Negative scalp soft tissues.  IMPRESSION: 1.  Small acute infarcts in the right hippocampus and right occipital lobe, implicating right PCA territory ischemia.  No mass effect or hemorrhage. 2.  Chronic small vessel ischemia in the left basal ganglia and possibly also the medial left thalamus. 3.  See MRA findings below. 4. Mild left mastoid effusion.  MRA HEAD WITHOUT CONTRAST  Technique: Angiographic images of the Circle of Willis were obtained using MRA technique without  intravenous contrast.  Findings: Antegrade flow in the posterior circulation with relatively codominant distal vertebral arteries.  Normal PICA vessels.  Patent vertebrobasilar junction.  No basilar stenosis or irregularity.  SCA and PCA origins are within normal limits.  In the left PCA P2 segment there is a short segment stenosis which appears moderate to severe.  There is preserved more distal left PCA flow.  On the right there is a larger 45 mm segment of loss of flow signal in the distal right P2 segment.  More distal PCA flow is attenuated beyond this point.  Diminutive right posterior communicating artery.  Antegrade flow in both ICA siphons.  Cavernous and supraclinoid segment irregularity, but no hemodynamically significant ICA stenosis.  Ophthalmic artery origins are within normal limits. Small infundibulum of the distal left ICA probably related to the anterior choroidal artery origin.  The left posterior communicating artery is diminutive or absent.  Normal right posterior communicating artery origin.  Carotid termini are patent.  ACA origins are within normal limits.  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  Other visualized ACA branches are within normal limits.  MCA origins are patent.  Both M1 segment are regular, more so the right.  There is a mild proximal left MCA M1 stenosis.  There is a mild mid right  M1 segment stenosis.  Left MCA trifurcation and right MCA bifurcation are patent.  Minimal to mild irregularity in the visualized MCA branches.  IMPRESSION: 1.  High-grade stenosis or focal near occlusion of the right PCA distal P2 segment with attenuated more distal right PCA flow. 2.  Moderate to severe focal stenosis of the left PCA P2 segment. 3.  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 4.  Mild to moderate anterior circulation atherosclerosis, most pronounced in the MCA M1 segments.  Study discussed by telephone with Dr. Chaney Malling on 05/18/2012 at 1350 hours.   Original Report Authenticated By: Erskine Speed, M.D.    Mr Mra Head/brain Wo Cm  05/18/2012  *RADIOLOGY REPORT*  Clinical Data:  71 year old male with left side numbness and tingling.  Hypertension.  Comparison: None.  MRI HEAD WITHOUT CONTRAST  Technique: Multiplanar, multiecho pulse sequences of the brain and surrounding structures were obtained according to standard protocol without intravenous contrast.  Findings: There is an 11 mm globular area of restricted diffusion in the right occipital pole mostly affecting white matter (series 3 image 11).  There is also restricted diffusion in the body of the right hippocampus (series 3 image 13).  No other definite diffusion abnormality.  These areas show T2 and FLAIR hyperintensity without mass effect or hemorrhage.  Major intracranial vascular flow voids are preserved.  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  Mild involvement of the left lentiform nuclei.  Possible tiny chronic lacunar infarct in the medial left thalamus.  Other deep gray matter nuclei within normal limits.  Probable inconsequential dilated perivascular spaces in the mid brain. Brainstem, cerebellum, and visualized cervical spine are within normal limits.  No cerebral cortical encephalomalacia.  Negative pituitary. No midline shift, mass effect, or evidence of mass lesion.  No  ventriculomegaly.  Normal marrow signal.  Postoperative changes to the globes. Scattered left mastoid air cell fluid.  Negative visualized nasopharynx.  Other Visualized paranasal sinuses and mastoids are clear.  Negative scalp soft tissues.  IMPRESSION: 1.  Small acute infarcts in the right hippocampus and right occipital lobe, implicating right PCA territory ischemia.  No mass effect or hemorrhage. 2.  Chronic small vessel ischemia in the left basal ganglia and possibly also the medial left thalamus. 3.  See MRA findings below. 4. Mild left mastoid effusion.  MRA HEAD WITHOUT CONTRAST  Technique: Angiographic images of the Circle of Willis were obtained using MRA technique without  intravenous contrast.  Findings: Antegrade flow in the posterior circulation with relatively codominant distal vertebral arteries.  Normal PICA vessels.  Patent vertebrobasilar junction.  No basilar stenosis or irregularity.  SCA and PCA origins are within normal limits.  In the left PCA P2 segment there is a short segment stenosis which appears moderate to severe.  There is preserved more distal left PCA flow.  On the right there is a larger 45 mm segment of loss of flow signal in the distal right P2 segment.  More distal PCA flow is attenuated beyond this point.  Diminutive right posterior communicating artery.  Antegrade flow in both ICA siphons.  Cavernous and supraclinoid segment irregularity, but no hemodynamically significant ICA stenosis.  Ophthalmic artery origins are within normal limits. Small infundibulum of the distal left ICA probably related to the anterior choroidal artery origin.  The left posterior communicating artery is diminutive or absent.  Normal right posterior communicating artery origin.  Carotid termini are patent.  ACA origins are within normal limits.  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  Other visualized ACA branches are  within normal limits.  MCA origins are patent.  Both M1 segment are regular, more so the right.  There is a mild proximal left MCA M1 stenosis.  There is a mild mid right M1 segment stenosis.  Left MCA trifurcation and right MCA bifurcation are patent.  Minimal to mild irregularity in the visualized MCA branches.  IMPRESSION: 1.  High-grade stenosis or focal near occlusion of the right PCA distal P2 segment with attenuated more distal right PCA flow. 2.  Moderate to severe focal stenosis of the left PCA P2 segment. 3.  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 4.  Mild to moderate anterior circulation atherosclerosis, most pronounced in the MCA M1 segments.  Study discussed by telephone with Dr. Chaney Malling on 05/18/2012 at 1350 hours.   Original Report Authenticated By: Erskine Speed, M.D.     Microbiology: No results found for this or any previous visit (from the past 240 hour(s)).   Labs: Basic Metabolic Panel:  Lab 05/21/12 1610 05/18/12 1029  NA 137 138  K 3.9 4.1  CL 103 103  CO2 23 24  GLUCOSE 113* 145*  BUN 20 13  CREATININE 1.08 1.11  CALCIUM 9.4 9.1  MG -- --  PHOS -- --   Liver Function Tests:  Lab 05/18/12 1029  AST 28  ALT 27  ALKPHOS 54  BILITOT 0.3  PROT 7.0  ALBUMIN 3.7   No results found for this basename: LIPASE:5,AMYLASE:5 in the last 168 hours No results found for this basename: AMMONIA:5 in the last 168 hours CBC:  Lab 05/21/12 1429 05/18/12 1029  WBC 8.2 6.9  NEUTROABS -- 3.9  HGB 15.0 14.4  HCT 42.2 41.7  MCV 89.2 89.3  PLT 185 194   Cardiac Enzymes:  Lab 05/18/12 1029  CKTOTAL --  CKMB --  CKMBINDEX --  TROPONINI <0.30   BNP: BNP (last 3 results) No results found for this basename: PROBNP:3 in the last 8760 hours CBG:  Lab 05/21/12 1226  GLUCAP 102*       Signed:  LAMA,GAGAN S  [facility] 05/22/2012, 3:43 PM

## 2012-05-22 NOTE — Progress Notes (Signed)
Echocardiogram Echocardiogram Transesophageal has been performed.  Marlette Curvin 05/22/2012, 4:01 PM

## 2012-05-23 ENCOUNTER — Encounter (HOSPITAL_COMMUNITY): Payer: Self-pay | Admitting: Cardiology

## 2012-05-23 ENCOUNTER — Encounter (HOSPITAL_COMMUNITY): Payer: Self-pay

## 2012-06-01 DIAGNOSIS — H53469 Homonymous bilateral field defects, unspecified side: Secondary | ICD-10-CM | POA: Diagnosis not present

## 2012-06-02 ENCOUNTER — Ambulatory Visit (INDEPENDENT_AMBULATORY_CARE_PROVIDER_SITE_OTHER): Payer: Medicare Other | Admitting: Family Medicine

## 2012-06-02 ENCOUNTER — Encounter: Payer: Self-pay | Admitting: Family Medicine

## 2012-06-02 VITALS — BP 162/84 | HR 72 | Temp 97.6°F | Ht 63.0 in | Wt 195.2 lb

## 2012-06-02 DIAGNOSIS — E669 Obesity, unspecified: Secondary | ICD-10-CM

## 2012-06-02 DIAGNOSIS — I658 Occlusion and stenosis of other precerebral arteries: Secondary | ICD-10-CM | POA: Diagnosis not present

## 2012-06-02 DIAGNOSIS — Z1331 Encounter for screening for depression: Secondary | ICD-10-CM

## 2012-06-02 DIAGNOSIS — I6523 Occlusion and stenosis of bilateral carotid arteries: Secondary | ICD-10-CM

## 2012-06-02 DIAGNOSIS — H53462 Homonymous bilateral field defects, left side: Secondary | ICD-10-CM

## 2012-06-02 DIAGNOSIS — H53469 Homonymous bilateral field defects, unspecified side: Secondary | ICD-10-CM

## 2012-06-02 DIAGNOSIS — I6350 Cerebral infarction due to unspecified occlusion or stenosis of unspecified cerebral artery: Secondary | ICD-10-CM

## 2012-06-02 DIAGNOSIS — I10 Essential (primary) hypertension: Secondary | ICD-10-CM

## 2012-06-02 DIAGNOSIS — E785 Hyperlipidemia, unspecified: Secondary | ICD-10-CM | POA: Diagnosis not present

## 2012-06-02 DIAGNOSIS — G459 Transient cerebral ischemic attack, unspecified: Secondary | ICD-10-CM

## 2012-06-02 DIAGNOSIS — I639 Cerebral infarction, unspecified: Secondary | ICD-10-CM

## 2012-06-02 MED ORDER — ATORVASTATIN CALCIUM 10 MG PO TABS: 10.0000 mg | ORAL_TABLET | Freq: Every day | ORAL | Status: DC

## 2012-06-02 NOTE — Progress Notes (Signed)
Subjective:    Patient ID: Craig Avery, male    DOB: Jul 05, 1941, 71 y.o.   MRN: [id number]  HPI CC: new pt to establish  "I haven't seen doctor in 50 yrs".  Recent hospitalization x2 at [facility].  Initially with L sided paresthesias, concern for stroke given significantly elevated blood pressure (240/120).  Found to have stroke - acute right PCA distribution ischemic infarction involving the right occipital region and right thalamus along with severe R PCA ATH.  Returned a few days later to ER hypertensive to >200 systolic and tingling, again admitted for observation.  Recommended f/u with Dr. Pearlean Brownie in 2 months.  Complaint with aspirin 325mg , atorvastatin 10mg  and norvasc 10, lisinopril 10mg .  Is checking blood pressure at home and at work.  bp at home - 170-190/60-70s.  Saw [facility] yesterday - some decreased vision noted.  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  All hospital records reviewed. Carotid stenosis - R 40-59%, L >80% TEE - vigorous systolic fxn with EF 65-70%, no vegetations/thrombus. Recommendation was outpatient holter monitor.  Initial MRI/MRA: IMPRESSION:  1. Small acute infarcts in the right hippocampus and right occipital lobe, implicating right PCA territory ischemia. No mass effect or hemorrhage.  2. Chronic small vessel ischemia in the left basal ganglia and possibly also the medial left thalamus.  3. See MRA findings below.  4. Mild left mastoid effusion. IMPRESSION: 1. High-grade stenosis or focal near occlusion of the right PCA distal P2 segment with attenuated more distal right PCA flow.  2. Moderate to severe focal stenosis of the left PCA P2 segment.  3. 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  4. Mild to moderate anterior circulation atherosclerosis, most pronounced in the MCA M1 segments.  Rpt MRI: IMPRESSION:  Interval development of two tiny infarcts inferior mid right temporal lobe and inferior aspect of the posterior limb of  the right internal capsule.  Trying to eat healthier.  Noticed 5 lb weight loss in last week.  Less night time snacking.  Has noticed BP elevated for years.  Denies depression/anhedonia, sadness. No falls in last year.  Preventative: No recent CPE Tetanus 1999  Caffeine: light coffee in am, working on cutting back Lives with wife, 1 dog.  Daughter next door.  Other children nearby. Occupation: Music therapist in maintenance dept Edu: [employer] Activity: no regular exercise, stays active at work. Diet: not much water, some fruits/vegetables   Medications and allergies reviewed and updated in chart.  Past histories reviewed and updated if relevant as below. Patient Active Problem List  Diagnosis  . HTN (hypertension)  . Stroke  . Carotid stenosis, bilateral  . S/P cataract surgery  . TIA (transient ischemic attack)  . Hyperlipidemia   Past Medical History  Diagnosis Date  . Stroke 05/2012  . HTN (hypertension)   . HLD (hyperlipidemia)   . History of chicken pox   . Obesity    Past Surgical History  Procedure Date  . Tee without cardioversion 05/22/2012    Procedure: TRANSESOPHAGEAL ECHOCARDIOGRAM (TEE);  Surgeon: Lewayne Bunting, MD;  Location: [facility] ENDOSCOPY;  Service: Cardiovascular;  Laterality: N/A;  . Cataract extraction     bilateral  . Wisdom tooth extraction    History  Substance Use Topics  . Smoking status: Former Smoker -- 1.5 packs/day for 33 years    Types: Cigarettes    Quit date: 06/18/1994  . Smokeless tobacco: Never Used  . Alcohol Use: 4.8 oz/week    8 Cans of beer  per week     Comment: Beer (more in the summer)   Family History  Problem Relation Age of Onset  . CAD Brother 48    MI  . Hypertension Mother   . Cirrhosis Father 29    EtOHic  . Aneurysm Paternal Uncle 50   No Known Allergies Current Outpatient Prescriptions on File Prior to Visit  Medication Sig Dispense Refill  . amLODipine (NORVASC) 10 MG tablet Take 1 tablet (10 mg total) by mouth  daily.  30 tablet  2  . aspirin 325 MG tablet Take 325 mg by mouth every evening.      Marland Kitchen atorvastatin (LIPITOR) 10 MG tablet Take 1 tablet (10 mg total) by mouth daily at 6 PM.  30 tablet  0  . lisinopril (PRINIVIL,ZESTRIL) 10 MG tablet Take 1 tablet (10 mg total) by mouth daily.  30 tablet  2  . acetaminophen (TYLENOL) 500 MG tablet Take 1,000 mg by mouth every 6 (six) hours as needed. For pain          Review of Systems  Constitutional: Negative for fever, chills, activity change, appetite change, fatigue and unexpected weight change.  HENT: Negative for hearing loss and neck pain.   Eyes: Positive for visual disturbance (left eye with floaters).  Respiratory: Negative for cough, chest tightness, shortness of breath and wheezing.   Cardiovascular: Negative for chest pain, palpitations and leg swelling.  Gastrointestinal: Negative for nausea, vomiting, abdominal pain, diarrhea, constipation, blood in stool and abdominal distention.  Genitourinary: Negative for hematuria and difficulty urinating.  Musculoskeletal: Negative for myalgias and arthralgias.  Skin: Negative for rash.  Neurological: Positive for headaches. Negative for dizziness, seizures and syncope.  Hematological: Does not bruise/bleed easily.  Psychiatric/Behavioral: Negative for dysphoric mood. The patient is not nervous/anxious.        Objective:   Physical Exam  Nursing note and vitals reviewed. Constitutional: He is oriented to person, place, and time. He appears well-developed and well-nourished. No distress.  HENT:  Head: Normocephalic and atraumatic.  Right Ear: Hearing, tympanic membrane, external ear and ear canal normal.  Left Ear: Hearing, tympanic membrane, external ear and ear canal normal.  Nose: Nose normal.  Mouth/Throat: Oropharynx is clear and moist. No oropharyngeal exudate.  Eyes: Conjunctivae normal and EOM are normal. Pupils are equal, round, and reactive to light. No scleral icterus.  Neck:  Normal range of motion. Neck supple. Carotid bruit is present (harsh R, also on L). No thyromegaly present.  Cardiovascular: Normal rate, regular rhythm, normal heart sounds and intact distal pulses.   No murmur heard. Pulses:      Radial pulses are 2+ on the right side, and 2+ on the left side.  Pulmonary/Chest: Effort normal and breath sounds normal. No respiratory distress. He has no wheezes. He has no rales.  Abdominal: Soft. Bowel sounds are normal. He exhibits no distension and no mass. There is no tenderness. There is no rebound and no guarding.  Musculoskeletal: Normal range of motion. He exhibits no edema.  Lymphadenopathy:    He has no cervical adenopathy.  Neurological: He is alert and oriented to person, place, and time.       CN grossly intact, station and gait intact  Skin: Skin is warm and dry. No rash noted.  Psychiatric: He has a normal mood and affect. His behavior is normal. Judgment and thought content normal.       Assessment & Plan:

## 2012-06-02 NOTE — Patient Instructions (Addendum)
Pass by [facility]'s office to check on referral to neurologist. Continue medicines as up to now. Return to see me in 1 month. 1 week prior to appointment, increase lisinopril to 20mg  daily (2 pills).  Keep log 1 week prior to appointment. We will check blood work at next visit.

## 2012-06-04 ENCOUNTER — Encounter: Payer: Self-pay | Admitting: Family Medicine

## 2012-06-04 DIAGNOSIS — E669 Obesity, unspecified: Secondary | ICD-10-CM | POA: Insufficient documentation

## 2012-06-04 DIAGNOSIS — E66811 Obesity, class 1: Secondary | ICD-10-CM | POA: Insufficient documentation

## 2012-06-04 NOTE — Assessment & Plan Note (Signed)
Chronically elevated. Now on amlodipine 10mg  daily as well as lisinopril 10mg  daily. Will slowly titrate up.  Start 20mg  lisinopril 1 wk prior to next appt, then will check blood work at f/u visit. Will also need TSH checked.

## 2012-06-04 NOTE — Assessment & Plan Note (Addendum)
On aspirin 325mg  for secondary prevention as well as ACEI, norvasc, and lipitor. Continue meds.  Schedule f/u with neurology for around 2 mo from now. Holter monitoring recommended at discharge I presume to eval for intermittent afib or other arrhythmia - will refer to cards for holter monitor.

## 2012-06-04 NOTE — Assessment & Plan Note (Signed)
Found in hospital - 80% on the left and 40-60% on the right   Will need referral to vascular for eval/treatment after acute phase of recent stroke.  Severe LICA stenosis contralateral to side of stroke.

## 2012-06-04 NOTE — Assessment & Plan Note (Signed)
LDL 117, started on lipitor 10mg  daily in hospital.

## 2012-06-04 NOTE — Assessment & Plan Note (Addendum)
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 Pt continues to work, 45 min commute [place] on [place].   I'm concerned about this partial L HH compromising his ability to safely navigate highway.   I will touch base with Dr. Cheryll Cockayne to see if he also recommends against driving, if so pt may need to take time off work until re eval by [employer] (scheduled in 3 months).  ADDENDUM==>I spoke with Dr. Inez Pilgrim, he recommends DMV visual field test, they will call pt to schedule this to see if he is able to continue driving.

## 2012-06-04 NOTE — Addendum Note (Signed)
Addended by: Eustaquio Boyden on: 06/04/2012 12:30 PM   Modules accepted: Orders, Level of Service

## 2012-06-04 NOTE — Assessment & Plan Note (Signed)
Had rpt TIA that led to hospitalziation after initial CVA.

## 2012-06-05 ENCOUNTER — Other Ambulatory Visit: Payer: Self-pay

## 2012-06-05 ENCOUNTER — Telehealth: Payer: Self-pay | Admitting: Family Medicine

## 2012-06-05 NOTE — Telephone Encounter (Signed)
Spoke with patient's wife and advised to expect call from [facility] for cards referral. Also advised the reason for the eye appt was because Dr. Reece Agar spoke with Dr. Inez Pilgrim at [facility] who recommended the [facility] visual field testing in order to continue driving (Apparently [facility] contacted the patient before I got an opportunity to and had them confused) Patient's wife will call and reschedule appt now that she is aware of what is going on.

## 2012-06-05 NOTE — Telephone Encounter (Signed)
plz notify pt - I would like to also schedule him for cardiology eval for holter monitor which was recommended prior to discharge (to see if any heart arrhythmias contributing to recent stroke).  I placed order in chart.  We will be contacting him to schedule this.

## 2012-06-05 NOTE — Telephone Encounter (Signed)
[facility] called patient and patient's wife scheduled an appointment for him with Dr.Brassington.  Patient called [facility] and said he didn't know why the appointment was scheduled and why they need to talk to him about his driver's license.  They cancelled the appointment.  Darlene said if you have any questions, you can call her.

## 2012-06-05 NOTE — Telephone Encounter (Signed)
Pt's wife left v/m that she went by [facility] to pick up paperwork; DMV did not know what pt needed. pts wife request call back [phone].

## 2012-06-06 NOTE — Telephone Encounter (Signed)
Spoke with referral coordinator at [facility].  She will call pt to clarify.

## 2012-06-06 NOTE — Telephone Encounter (Signed)
Patient has spoken with Agustin Cree, referral coordinator.

## 2012-06-12 DIAGNOSIS — H53469 Homonymous bilateral field defects, unspecified side: Secondary | ICD-10-CM | POA: Diagnosis not present

## 2012-06-14 ENCOUNTER — Ambulatory Visit (INDEPENDENT_AMBULATORY_CARE_PROVIDER_SITE_OTHER): Payer: Medicare Other | Admitting: Cardiovascular Disease

## 2012-06-14 ENCOUNTER — Encounter: Payer: Self-pay | Admitting: Cardiovascular Disease

## 2012-06-14 VITALS — BP 160/68 | HR 61 | Ht 64.0 in | Wt 194.2 lb

## 2012-06-14 DIAGNOSIS — E669 Obesity, unspecified: Secondary | ICD-10-CM

## 2012-06-14 DIAGNOSIS — I10 Essential (primary) hypertension: Secondary | ICD-10-CM | POA: Diagnosis not present

## 2012-06-14 DIAGNOSIS — Z8673 Personal history of transient ischemic attack (TIA), and cerebral infarction without residual deficits: Secondary | ICD-10-CM | POA: Diagnosis not present

## 2012-06-14 DIAGNOSIS — G459 Transient cerebral ischemic attack, unspecified: Secondary | ICD-10-CM

## 2012-06-14 DIAGNOSIS — I658 Occlusion and stenosis of other precerebral arteries: Secondary | ICD-10-CM | POA: Diagnosis not present

## 2012-06-14 DIAGNOSIS — I6523 Occlusion and stenosis of bilateral carotid arteries: Secondary | ICD-10-CM

## 2012-06-14 DIAGNOSIS — E785 Hyperlipidemia, unspecified: Secondary | ICD-10-CM

## 2012-06-14 MED ORDER — LISINOPRIL 20 MG PO TABS: 20.0000 mg | ORAL_TABLET | Freq: Every day | ORAL | Status: DC

## 2012-06-14 NOTE — Progress Notes (Signed)
Patient ID: Craig Avery, male    DOB: 1940/12/27, 71 y.o.   MRN: [id number]  HPI Comments: 71 year old gentleman with history of hypertension and peripheral vascular disease, smoking history , recent hospitalization x2 at [facility], L sided paresthesias, Found to have stroke (acute right PCA distribution ischemic infarction involving the right occipital region and right thalamus along with severe R PCA), returned a few days later to ER hypertensive to >200 systolic,  With medication management for blood pressure  Pound to have severe left carotid stenosis, estimated at greater than 70% of the proximal left internal carotid artery.  He has refused open carotid endarterectomy.  He reports that he is been compliant with aspirin 325mg , atorvastatin 10mg  and norvasc 10, lisinopril 10mg .  Blood pressure at home continues to run high typically in the 150-160 range or higher .  Recently seen by [facility]  some decreased vision noted.  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  Carotid stenosis - R 40-59%, L >80% TEE - vigorous systolic fxn with EF 65-70%, no vegetations/thrombus.  Initial MRI/MRA: 1. Small acute infarcts in the right hippocampus and right occipital lobe, implicating right PCA territory ischemia. No mass effect or hemorrhage.  2. Chronic small vessel ischemia in the left basal ganglia and possibly also the medial left thalamus.   IMPRESSION: 1. High-grade stenosis or focal near occlusion of the right PCA distal P2 segment with attenuated more distal right PCA flow.  2. Moderate to severe focal stenosis of the left PCA P2 segment.  3. 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  4. Mild to moderate anterior circulation atherosclerosis, most pronounced in the MCA M1 segments.  EKG shows normal sinus rhythm with rate 61 beats per minute, no significant ST or T wave changes     Outpatient Encounter Prescriptions as of 06/14/2012  Medication Sig Dispense Refill  .  acetaminophen (TYLENOL) 500 MG tablet Take 1,000 mg by mouth every 6 (six) hours as needed. For pain      . amLODipine (NORVASC) 10 MG tablet Take 1 tablet (10 mg total) by mouth daily.  30 tablet  2  . aspirin 325 MG tablet Take 325 mg by mouth every evening.      Marland Kitchen atorvastatin (LIPITOR) 10 MG tablet Take 1 tablet (10 mg total) by mouth daily at 6 PM.  90 tablet  3  . Krill Oil 500 MG CAPS Take 1 capsule by mouth daily.      Marland Kitchen  lisinopril (PRINIVIL,ZESTRIL) 10 MG tablet Take 1 tablet (10 mg total) by mouth daily.  30 tablet  2     Review of Systems  Constitutional: Negative.   HENT: Negative.   Eyes: Negative.   Respiratory: Negative.   Cardiovascular: Negative.   Gastrointestinal: Negative.   Musculoskeletal: Negative.   Skin: Negative.   Neurological: Negative.   Hematological: Negative.   Psychiatric/Behavioral: Negative.   All other systems reviewed and are negative.    BP 160/68  Pulse 61  Ht 5\' 4"  (1.626 m)  Wt 194 lb 4 oz (88.111 kg)  BMI 33.34 kg/m2  Physical Exam  Nursing note and vitals reviewed. Constitutional: He is oriented to person, place, and time. He appears well-developed and well-nourished.  HENT:  Head: Normocephalic.  Nose: Nose normal.  Mouth/Throat: Oropharynx is clear and moist.  Eyes: Conjunctivae normal are normal. Pupils are equal, round, and reactive to light.  Neck: Normal range of motion. Neck supple. No JVD present. Carotid bruit is present.  Cardiovascular: Normal rate, regular rhythm, S1 normal, S2 normal, normal heart sounds and intact distal pulses.  Exam reveals no gallop and no friction rub.   No murmur heard. Pulmonary/Chest: Effort normal and breath sounds normal. No respiratory distress. He has no wheezes. He has no rales. He exhibits no tenderness.  Abdominal: Soft. Bowel sounds are normal. He exhibits no distension. There is no tenderness.  Musculoskeletal: Normal range of motion. He exhibits no edema and no tenderness.   Lymphadenopathy:    He has no cervical adenopathy.  Neurological: He is alert and oriented to person, place, and time. Coordination normal.  Skin: Skin is warm and dry. No rash noted. No erythema.  Psychiatric: He has a normal mood and affect. His behavior is normal. Judgment and thought content normal.           Assessment and Plan

## 2012-06-14 NOTE — Assessment & Plan Note (Signed)
We spent most of our time discussing his carotid arterial stenosis. Velocity on the left is very elevated with a diastolic velocity close to 200. We have expressed to him the importance of having this addressed. He's not interested in carotid endarterectomy though would be interested in discussing stenting. We have discussed the case with Dr. Rubye Oaks of vascular in [place] and he has suggested carotid CTA including the aorta arch, with followup with vascular surgery, in particular Dr. Myra Gianotti or fields. This appointment for CTA and followup has been made for the next several weeks.

## 2012-06-14 NOTE — Assessment & Plan Note (Signed)
Given his severe carotid arterial disease,: LDL should be less than 70. Lipitor could be titrated up slowly as needed.

## 2012-06-14 NOTE — Assessment & Plan Note (Signed)
Etiology of his stroke most likely secondary to underlying peripheral vascular disease. He denies any palpitations. No arrhythmia noted in the hospital per the notes. For now, we have suggested we focus on addressing his carotid disease given the severity of disease on the left. I've asked him to closely monitor his rhythm using his blood pressure cuff machine that shows his pulse. I suspect his stroke and recent neurologic symptoms is most likely from peripheral vascular disease, less likely arrhythmia. No thrombus seen on TEE.

## 2012-06-14 NOTE — Assessment & Plan Note (Signed)
We have encouraged continued careful diet management in an effort to lose weight.  

## 2012-06-14 NOTE — Assessment & Plan Note (Signed)
Blood pressure continues to be elevated. We have suggested he increase his lisinopril to 20 mg daily. He has followup with Dr. Sharen Hones in the next several weeks.

## 2012-06-14 NOTE — Patient Instructions (Addendum)
You are doing well. Please increase the lisinopril to 20 mg daily (take two of the 10 mg until you run out, then take one of the 20 mg pills)  Please call us if you have new issues that need to be addressed before your next appt.  Your physician wants you to follow-up in: 3 months.  You will receive a reminder letter in the mail two months in advance. If you don't receive a letter, please call our office to schedule the follow-up appointment.  Non-Cardiac CT scanning, (CAT scanning), is a noninvasive, special x-ray that produces cross-sectional images of the body using x-rays and a computer. CT scans help physicians diagnose and treat medical conditions. For some CT exams, a contrast material is used to enhance visibility in the area of the body being studied. CT scans provide greater clarity and reveal more details than regular x-ray exams.  Please arrive at [facility] at 2:15 pm tomorrow 06/15/12 for CT scan of neck.  You have been referred to [facility] to see Dr. Darrick Penna 06/29/12 at 9:00 am.

## 2012-06-14 NOTE — Addendum Note (Signed)
Addended by: Antonieta Iba on: 06/14/2012 06:56 PM   Modules accepted: Level of Service

## 2012-06-15 ENCOUNTER — Ambulatory Visit: Payer: Self-pay | Admitting: Cardiovascular Disease

## 2012-06-15 ENCOUNTER — Telehealth: Payer: Self-pay

## 2012-06-15 ENCOUNTER — Other Ambulatory Visit: Payer: Self-pay

## 2012-06-15 DIAGNOSIS — I6529 Occlusion and stenosis of unspecified carotid artery: Secondary | ICD-10-CM | POA: Diagnosis not present

## 2012-06-15 DIAGNOSIS — I658 Occlusion and stenosis of other precerebral arteries: Secondary | ICD-10-CM | POA: Diagnosis not present

## 2012-06-15 NOTE — Telephone Encounter (Signed)
Tammy called back, says CT needs corrected order for "CT Angio neck" Will send new order

## 2012-06-15 NOTE — Telephone Encounter (Signed)
Message copied by Marcelle Overlie on Thu Jun 15, 2012  9:59 AM ------      Message from: Thersa Salt      Created: Thu Jun 15, 2012  9:22 AM      Regarding: CT        Per Babette Relic [phone] needs to talk to you concerning pt CT order today

## 2012-06-15 NOTE — Telephone Encounter (Signed)
LMTCB

## 2012-06-16 ENCOUNTER — Telehealth: Payer: Self-pay

## 2012-06-16 ENCOUNTER — Other Ambulatory Visit: Payer: Self-pay

## 2012-06-16 DIAGNOSIS — I6529 Occlusion and stenosis of unspecified carotid artery: Secondary | ICD-10-CM

## 2012-06-16 NOTE — Telephone Encounter (Signed)
Okey Regal with vascular called back She says Dr. Bonney Leitz can see pt 12/9 at 1015 She asks that pt bring with him CD of CT scan I will call pt to inform CT dept at [facility] notified of the need for CT scan on CD  I called pt and spoke with his wife She was informed of appt 12/9 and I gave her instructions re: where/when to pick up CD at [facility] Understanding verb

## 2012-06-16 NOTE — Telephone Encounter (Signed)
Dr. Mariah Milling reviewed CT scan of neck This showed "near occlusive stenosis left carotid bifurcation" Per Dr. Mariah Milling, "Call Dr. Darrick Penna' to see if they have sooner appt than 12/19" VO Dr. Alvis Lemmings, RN  I called [facility]. Okey Regal tells me the soonest would be 12/12 She will check with scheduling and call me back

## 2012-06-19 ENCOUNTER — Ambulatory Visit (INDEPENDENT_AMBULATORY_CARE_PROVIDER_SITE_OTHER): Payer: Medicare Other | Admitting: Surgery

## 2012-06-19 ENCOUNTER — Encounter: Payer: Self-pay | Admitting: Surgery

## 2012-06-19 VITALS — BP 194/61 | HR 64 | Resp 16 | Ht 64.0 in | Wt 193.0 lb

## 2012-06-19 DIAGNOSIS — I6529 Occlusion and stenosis of unspecified carotid artery: Secondary | ICD-10-CM

## 2012-06-19 NOTE — Progress Notes (Signed)
[facility] [place]  Patient name: Craig Avery MRN: [id number] DOB: 10/20/1940 Sex: male  Referred by: Dr. Gollan  Reason for referral:  Chief Complaint   Patient presents with   .  Carotid     New pt, carotid stenosis, Dr. Gollan    HISTORY OF PRESENT ILLNESS:  The patient is referred today for evaluation of his carotid disease. In November, the patient had several episodes of left-sided numbness. They all went away however 1 day it became very severe while he was at work and therefore he went to the hospital. Workup revealed 2 acute right brain infarcts and chronic left brain infarcts. Ultrasound showed a high-grade left carotid stenosis with a 40-59% right carotid stenosis. The patient was treated medically and discharged. He repositioned in 3 days later with similar symptoms. This was associated with elevated blood pressure. His blood pressure medicines were changed and he was again discharged home. He also underwent a repeat MRI which showed 2 new infarcts on the right. The patient has been on for the past 2 weeks and has been without symptoms. He is medically managed now for his cholesterol with a statin and his blood pressure with multiple medications. He is on single agent antiplatelet therapy with a full dose aspirin.  The patient has a history of smoking but hasn't for approximately 15 years. He has a brother who had heart attack at age 50. He is without symptoms currently with the exception of some residual left lower leg numbness. He has no weakness in either extremity. He has no dysphagia or slurred speech. He denies amaurosis fugax.  Past Medical History   Diagnosis  Date   .  Stroke  05/2012     R PCA territory, presented with L paresthesias   .  HTN (hypertension)    .  HLD (hyperlipidemia)    .  History of chicken pox    .  Obesity    .  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  Tee  without cardioversion  05/22/2012     Procedure: TRANSESOPHAGEAL ECHOCARDIOGRAM (TEE); Surgeon: Brian S Crenshaw, MD; Location: [facility]; Service: Cardiovascular; Laterality: N/A;   .  Cataract extraction  2004     bilateral   .  Wisdom tooth extraction     History    Social History   .  Marital Status:  Married     Spouse Name:  N/A     Number of Children:  N/A   .  Years of Education:  N/A    Occupational History   .  Not on file.    Social History Main Topics   .  Smoking status:  Former Smoker -- 1.5 packs/day for 33 years     Types:  Cigarettes     Quit date:  06/18/1994   .  Smokeless tobacco:  Never Used   .  Alcohol Use:  6.0 - 7.2 oz/week     10-12 Cans of beer per week   .  Drug Use:  No   .  Sexually Active:  Yes    Other Topics  Concern   .  Not on file    Social History Narrative    Caffeine: light coffee in am, working on cutting backLives with wife, 1 dog. Daughter next door. Other children nearby.Occupation: carpenter in maintenance deptEdu: HSActivity: no regular exercise,   stays active at work.Diet: not much water, some fruits/vegetables    Family History   Problem  Relation  Age of Onset   .  CAD  Brother  48      MI    .  Heart disease  Brother       before age 60    .  Hypertension  Mother    .  Cirrhosis  Father  52      EtOHic    .  Aneurysm  Paternal Uncle  50   .  Heart disease  Daughter    .  Hyperlipidemia  Daughter     Allergies as of 06/19/2012   .  (No Known Allergies)    Current Outpatient Prescriptions on File Prior to Visit   Medication  Sig  Dispense  Refill   .  acetaminophen (TYLENOL) 500 MG tablet  Take 1,000 mg by mouth every 6 (six) hours as needed. For pain     .  amLODipine (NORVASC) 10 MG tablet  Take 1 tablet (10 mg total) by mouth daily.  30 tablet  2   .  aspirin 325 MG tablet  Take 325 mg by mouth every evening.     .  atorvastatin (LIPITOR) 10 MG tablet  Take 1 tablet (10 mg total) by mouth daily at 6 PM.   90 tablet  3   .  Krill Oil 500 MG CAPS  Take 1 capsule by mouth daily.     .  lisinopril (PRINIVIL,ZESTRIL) 20 MG tablet  Take 1 tablet (20 mg total) by mouth daily.  30 tablet  3    REVIEW OF SYSTEMS:  Please see history of present illness, otherwise all systems are negative as documented by the patient  PHYSICAL EXAMINATION:  General: The patient appears their stated age. Vital signs are BP 194/61  Pulse 64  Resp 16  Ht 5' 4" (1.626 m)  Wt 193 lb (87.544 kg)  BMI 33.13 kg/m2  SpO2 98%  HEENT: No gross abnormalities  Pulmonary: Respirations are non-labored  Abdomen: Soft and non-tender . Aorta not palpable  Musculoskeletal: There are no major deformities.  Neurologic: No focal weakness or paresthesias are detected,  Skin: There are no ulcer or rashes noted.  Psychiatric: The patient has normal affect.  Cardiovascular: There is a regular rate and rhythm without significant murmur appreciated.  Diagnostic Studies:  The patient comes with a CT scan which revealed a high grade, 95% left carotid stenosis.  Outside Studies/Documentation  Historical records were reviewed. They showed bilateral infarcts, the right side is acute  Medication Changes:  I switched the patient to a baby aspirin, and started him on Plavix  Assessment:  High-grade, asymptomatic left carotid stenosis  Plan:  I spent greater than 90 minutes reviewing the patient's images and discussing this situation with he and his son-in-law. He has acute right brain infarcts, however by ultrasound and CT scan, his right carotid stenosis appears to be less than 50%. A transesophageal echo has been done and was negative. A CT angiogram was ordered last week to evaluate the possibility of considering carotid stenting. This shows a high-grade, 95% left carotid stenosis which is heavily calcified. I told the patient that I do not think based on his CT scan that he would be a good patient for carotid stenting. I have recommended carotid  endarterectomy. I would like to wait another 4-6 weeks for him to resolve the issues he is having with his right brain

## 2012-06-29 ENCOUNTER — Encounter: Payer: Medicare Other | Admitting: Vascular Surgery

## 2012-07-03 ENCOUNTER — Ambulatory Visit: Payer: Medicare Other | Admitting: Family Medicine

## 2012-07-03 DIAGNOSIS — Z0289 Encounter for other administrative examinations: Secondary | ICD-10-CM

## 2012-07-10 ENCOUNTER — Encounter (HOSPITAL_COMMUNITY): Payer: Self-pay | Admitting: Respiratory Therapy

## 2012-07-10 ENCOUNTER — Other Ambulatory Visit: Payer: Self-pay

## 2012-07-13 NOTE — Pre-Procedure Instructions (Signed)
20 Craig Avery  07/13/2012   Your procedure is scheduled on:  Friday, January 10th  Report to [facility] at 0530 AM.  Call this number if you have problems the morning of surgery: [phone]   Remember:   Do not eat food or drink:After Midnight.   Take these medicines the morning of surgery with A SIP OF WATER: norvasc, tylenol if needed   Do not wear jewelry, make-up or nail polish.  Do not wear lotions, powders, or perfumes.   Do not shave 48 hours prior to surgery. Men may shave face and neck.  Do not bring valuables to the hospital.  Contacts, dentures or bridgework may not be worn into surgery.  Leave suitcase in the car. After surgery it may be brought to your room.  For patients admitted to the hospital, checkout time is 11:00 AM the day of discharge.   Patients discharged the day of surgery will not be allowed to drive home.    Special Instructions: Shower using CHG 2 nights before surgery and the night before surgery.  If you shower the day of surgery use CHG.  Use special wash - you have one bottle of CHG for all showers.  You should use approximately 1/3 of the bottle for each shower.   Please read over the following fact sheets that you were given: Pain Booklet, Coughing and Deep Breathing, Blood Transfusion Information, MRSA Information and Surgical Site Infection Prevention

## 2012-07-14 ENCOUNTER — Encounter (HOSPITAL_COMMUNITY)
Admission: RE | Admit: 2012-07-14 | Discharge: 2012-07-14 | Disposition: A | Discharge status: Home / Self Care | Payer: Medicare Other | Source: Ambulatory Visit | Attending: Anesthesiology | Admitting: Anesthesiology

## 2012-07-14 ENCOUNTER — Encounter (HOSPITAL_COMMUNITY): Payer: Self-pay

## 2012-07-14 ENCOUNTER — Encounter (HOSPITAL_COMMUNITY)
Admission: RE | Admit: 2012-07-14 | Discharge: 2012-07-14 | Disposition: A | Discharge status: Home / Self Care | Payer: Medicare Other | Source: Ambulatory Visit | Attending: Surgery | Admitting: Surgery

## 2012-07-14 ENCOUNTER — Other Ambulatory Visit: Payer: Self-pay | Admitting: *Deleted

## 2012-07-14 DIAGNOSIS — Z01818 Encounter for other preprocedural examination: Secondary | ICD-10-CM | POA: Diagnosis not present

## 2012-07-14 LAB — URINALYSIS, ROUTINE W REFLEX MICROSCOPIC
Bilirubin Urine: NEGATIVE
Hgb urine dipstick: NEGATIVE
Nitrite: NEGATIVE
Specific Gravity, Urine: 1.02 (ref 1.005–1.030)
pH: 7.5 (ref 5.0–8.0)

## 2012-07-14 LAB — COMPREHENSIVE METABOLIC PANEL
ALT: 27 U/L (ref 0–53)
AST: 21 U/L (ref 0–37)
CO2: 26 mEq/L (ref 19–32)
Chloride: 105 mEq/L (ref 96–112)
GFR calc non Af Amer: 66 mL/min — ABNORMAL LOW (ref >90)
Sodium: 142 mEq/L (ref 135–145)
Total Bilirubin: 0.6 mg/dL (ref 0.3–1.2)

## 2012-07-14 LAB — CBC
Platelets: 224 10*3/uL (ref 150–400)
RBC: 4.45 MIL/uL (ref 4.22–5.81)
WBC: 8.3 10*3/uL (ref 4.0–10.5)

## 2012-07-14 LAB — PROTIME-INR
INR: 0.91 (ref 0.00–1.49)
Prothrombin Time: 12.2 seconds (ref 11.6–15.2)

## 2012-07-14 NOTE — Progress Notes (Signed)
07/14/12 1057  OBSTRUCTIVE SLEEP APNEA  Have you ever been diagnosed with sleep apnea through a sleep study? No  Do you snore loudly (loud enough to be heard through closed doors)?  1  Do you often feel tired, fatigued, or sleepy during the daytime? 0  Has anyone observed you stop breathing during your sleep? 0  Do you have, or are you being treated for high blood pressure? 1  BMI more than 35 kg/m2? 0  Age over 74 years old? 1  Neck circumference greater than 40 cm/18 inches? 0  Gender: 1  Obstructive Sleep Apnea Score 4   Score 4 or greater  Results sent to PCP

## 2012-07-17 NOTE — Consult Note (Signed)
Anesthesia Chart Review:  Patient is a 72 year old male scheduled for left CEA by Dr. Myra Gianotti on 07/21/12.  History includes CVA X 2 05/2012 with left sided numbness and left homonymous hemianopsia, HTN, HLD, former smoker, obesity, cataract extraction.  OSA screen score was a 4.  PCP is listed as Dr. Eustaquio Boyden.    Cardiologist is Dr. Mariah Milling seen for HTN and PVD follow up on 06/14/12.  EKG then showed normal sinus rhythm with rate 61 beats per minute, no significant ST or T wave changes.  He referred patient to [facility] for further treatment options of patient's symptomatic carotid artery stenosis.  At this point he believe patient's CVAs are related to patient's carotid disease (instead of arrhythmia).  Patient initially refused open carotid endarterectomy, but further evaluation revealed that he was not a candidate for carotid stenting.  Dr. Myra Gianotti has placed patient on ASA 81 mg and Plavix 75 mg daily.  TEE on 05/22/12 showed: - Left ventricle: Systolic function was vigorous. The estimated ejection fraction was in the range of 65% to 70%. Wall motion was normal; there were no regional wall motion abnormalities. - Aortic valve: No evidence of vegetation. Mild regurgitation. - Mitral valve: No evidence of vegetation. - Left atrium: No evidence of thrombus in the atrial cavity or appendage. - Atrial septum: No defect or patent foramen ovale was identified. - Tricuspid valve: No evidence of vegetation. - Pulmonic valve: No evidence of vegetation. Impressions: Negative saline microcavitation study.  2D echo on 05/18/12 showed: - Left ventricle: The cavity size was normal. There was mild concentric hypertrophy. Systolic function was normal. The estimated ejection fraction was in the range of 55% to 60%. Wall motion was normal; there were no regional wall motion abnormalities. - Left atrium: The atrium was mildly dilated. Impressions: No cardiac source of embolism was identified, but cannot be ruled  out on the basis of this examination.  CXR on 07/14/12 showed: 1. Mild cardiomegaly.  2. No active lung disease.  3. Rounded opacity overlying the left lung base most likely is artifactual. Radiologist Dr. Dwyane Dee recommended a repeat PA chest x-ray with nipple markers.  (Order has been entered by Dr. Estanislado Spire staff for this to be done on the day of surgery.)   CTA of the neck on 06/15/12 showed 30% stenosis at the right carotid bifurcation, near occlusive stenosis (> 90%) at the left carotid bifurcation.  Carotid duplex on 05/18/12 showed 40 to 59% right ICA stenosis, >80% left ICA stenosis.  Vertebral flow was antegrade.  MRI/MRA of the brain on 05/18/12 showed: 1. Small acute infarcts in the right hippocampus and right occipital lobe, implicating right PCA territory ischemia. No mass effect or hemorrhage. 2. Chronic small vessel ischemia in the left basal ganglia and possibly also the medial left thalamus.  IMPRESSION (MRA): 1. High-grade stenosis or focal near occlusion of the right PCA distal P2 segment with attenuated more distal right PCA flow. 2. Moderate to severe focal stenosis of the left PCA P2 segment. 3. 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 4. Mild to moderate anterior circulation atherosclerosis, most pronounced in the MCA M1 segments. He had a repeat MRI on 05/22/12 when he developed significant HTN with recurrent symptoms that showed two new tiny infarcts in the inferior mid right temporal lobe and inferior aspect of the posterior limb of the right internal capsule.  Preoperative labs noted.  As above, he is for a repeat CXR with nipple markers on the  day of surgery.  If no acute change in his status then anticipate he can proceed as planned.

## 2012-07-20 MED ORDER — DEXTROSE 5 % IV SOLN
1.5000 g | INTRAVENOUS | Status: AC
  Administered 2012-07-21: 1.5 g via INTRAVENOUS
  Filled 2012-07-20: qty 1.5

## 2012-07-21 ENCOUNTER — Encounter (HOSPITAL_COMMUNITY): Payer: Self-pay | Admitting: Vascular Surgery

## 2012-07-21 ENCOUNTER — Inpatient Hospital Stay (HOSPITAL_COMMUNITY): Payer: Medicare Other | Admitting: Vascular Surgery

## 2012-07-21 ENCOUNTER — Telehealth: Payer: Self-pay | Admitting: Surgery

## 2012-07-21 ENCOUNTER — Encounter (HOSPITAL_COMMUNITY)
Admission: RE | Disposition: A | Discharge status: Home / Self Care | Payer: Self-pay | Source: Ambulatory Visit | Attending: Surgery

## 2012-07-21 ENCOUNTER — Inpatient Hospital Stay (HOSPITAL_COMMUNITY): Payer: Medicare Other

## 2012-07-21 ENCOUNTER — Inpatient Hospital Stay (HOSPITAL_COMMUNITY)
Admission: RE | Admit: 2012-07-21 | Discharge: 2012-07-22 | DRG: 039 | Disposition: A | Discharge status: Home / Self Care | Payer: Medicare Other | Source: Ambulatory Visit | Attending: Surgery | Admitting: Surgery

## 2012-07-21 ENCOUNTER — Encounter (HOSPITAL_COMMUNITY): Payer: Self-pay | Admitting: Surgery

## 2012-07-21 DIAGNOSIS — Z87891 Personal history of nicotine dependence: Secondary | ICD-10-CM | POA: Diagnosis not present

## 2012-07-21 DIAGNOSIS — E785 Hyperlipidemia, unspecified: Secondary | ICD-10-CM | POA: Diagnosis present

## 2012-07-21 DIAGNOSIS — I10 Essential (primary) hypertension: Secondary | ICD-10-CM | POA: Diagnosis not present

## 2012-07-21 DIAGNOSIS — Z8249 Family history of ischemic heart disease and other diseases of the circulatory system: Secondary | ICD-10-CM | POA: Diagnosis not present

## 2012-07-21 DIAGNOSIS — Z7982 Long term (current) use of aspirin: Secondary | ICD-10-CM

## 2012-07-21 DIAGNOSIS — R911 Solitary pulmonary nodule: Secondary | ICD-10-CM | POA: Diagnosis not present

## 2012-07-21 DIAGNOSIS — Z6833 Body mass index (BMI) 33.0-33.9, adult: Secondary | ICD-10-CM

## 2012-07-21 DIAGNOSIS — I6529 Occlusion and stenosis of unspecified carotid artery: Principal | ICD-10-CM

## 2012-07-21 DIAGNOSIS — E669 Obesity, unspecified: Secondary | ICD-10-CM | POA: Diagnosis present

## 2012-07-21 DIAGNOSIS — H53469 Homonymous bilateral field defects, unspecified side: Secondary | ICD-10-CM | POA: Diagnosis present

## 2012-07-21 DIAGNOSIS — H539 Unspecified visual disturbance: Secondary | ICD-10-CM | POA: Diagnosis not present

## 2012-07-21 DIAGNOSIS — I69998 Other sequelae following unspecified cerebrovascular disease: Secondary | ICD-10-CM | POA: Diagnosis not present

## 2012-07-21 HISTORY — PX: CAROTID ENDARTERECTOMY: SUR193

## 2012-07-21 HISTORY — PX: ENDARTERECTOMY: SHX5162

## 2012-07-21 HISTORY — PX: PATCH ANGIOPLASTY: SHX6230

## 2012-07-21 SURGERY — ENDARTERECTOMY, CAROTID
Anesthesia: General | Site: Neck | Laterality: Left | Wound class: Clean

## 2012-07-21 MED ORDER — ASPIRIN 81 MG PO TABS: 81.0000 mg | ORAL_TABLET | Freq: Every day | ORAL | Status: DC

## 2012-07-21 MED ORDER — SODIUM CHLORIDE 0.9 % IR SOLN
Status: DC | PRN
  Administered 2012-07-21: 08:00:00

## 2012-07-21 MED ORDER — DOCUSATE SODIUM 100 MG PO CAPS
100.0000 mg | ORAL_CAPSULE | Freq: Every day | ORAL | Status: DC
  Administered 2012-07-22: 100 mg via ORAL
  Filled 2012-07-21: qty 1

## 2012-07-21 MED ORDER — MIDAZOLAM HCL 5 MG/5ML IJ SOLN
INTRAMUSCULAR | Status: DC | PRN
  Administered 2012-07-21: 1 mg via INTRAVENOUS

## 2012-07-21 MED ORDER — FENTANYL CITRATE 0.05 MG/ML IJ SOLN
INTRAMUSCULAR | Status: AC
  Filled 2012-07-21: qty 2

## 2012-07-21 MED ORDER — LACTATED RINGERS IV SOLN
INTRAVENOUS | Status: DC | PRN
  Administered 2012-07-21 (×2): via INTRAVENOUS

## 2012-07-21 MED ORDER — ONDANSETRON HCL 4 MG/2ML IJ SOLN
INTRAMUSCULAR | Status: DC | PRN
  Administered 2012-07-21: 4 mg via INTRAVENOUS

## 2012-07-21 MED ORDER — LIDOCAINE HCL (PF) 1 % IJ SOLN
INTRAMUSCULAR | Status: AC
  Filled 2012-07-21: qty 30

## 2012-07-21 MED ORDER — IBUPROFEN 600 MG PO TABS
600.0000 mg | ORAL_TABLET | Freq: Once | ORAL | Status: AC
  Administered 2012-07-21: 600 mg via ORAL
  Filled 2012-07-21: qty 1

## 2012-07-21 MED ORDER — ONDANSETRON HCL 4 MG/2ML IJ SOLN: 4.0000 mg | Freq: Four times a day (QID) | INTRAMUSCULAR | Status: DC | PRN

## 2012-07-21 MED ORDER — OXYCODONE HCL 5 MG/5ML PO SOLN: 5.0000 mg | Freq: Once | ORAL | Status: DC | PRN

## 2012-07-21 MED ORDER — PANTOPRAZOLE SODIUM 40 MG PO TBEC
40.0000 mg | DELAYED_RELEASE_TABLET | Freq: Every day | ORAL | Status: DC
  Administered 2012-07-21 – 2012-07-22 (×2): 40 mg via ORAL
  Filled 2012-07-21 (×2): qty 1

## 2012-07-21 MED ORDER — LABETALOL HCL 5 MG/ML IV SOLN: 10.0000 mg | INTRAVENOUS | Status: DC | PRN

## 2012-07-21 MED ORDER — POTASSIUM CHLORIDE CRYS ER 20 MEQ PO TBCR: 20.0000 meq | EXTENDED_RELEASE_TABLET | Freq: Once | ORAL | Status: AC | PRN

## 2012-07-21 MED ORDER — PHENOL 1.4 % MT LIQD
1.0000 | OROMUCOSAL | Status: DC | PRN
Start: 2012-07-21 — End: 2012-07-22

## 2012-07-21 MED ORDER — PROTAMINE SULFATE 10 MG/ML IV SOLN
INTRAVENOUS | Status: DC | PRN
  Administered 2012-07-21 (×5): 10 mg via INTRAVENOUS

## 2012-07-21 MED ORDER — MEPERIDINE HCL 25 MG/ML IJ SOLN: 6.2500 mg | INTRAMUSCULAR | Status: DC | PRN

## 2012-07-21 MED ORDER — METOPROLOL TARTRATE 1 MG/ML IV SOLN: 2.0000 mg | INTRAVENOUS | Status: DC | PRN

## 2012-07-21 MED ORDER — HYDRALAZINE HCL 20 MG/ML IJ SOLN: 10.0000 mg | INTRAMUSCULAR | Status: DC | PRN

## 2012-07-21 MED ORDER — ACETAMINOPHEN 650 MG RE SUPP: 325.0000 mg | RECTAL | Status: DC | PRN

## 2012-07-21 MED ORDER — OXYCODONE-ACETAMINOPHEN 5-325 MG PO TABS
1.0000 | ORAL_TABLET | ORAL | Status: DC | PRN
  Administered 2012-07-21: 2 via ORAL
  Filled 2012-07-21: qty 2

## 2012-07-21 MED ORDER — ASPIRIN EC 325 MG PO TBEC
325.0000 mg | DELAYED_RELEASE_TABLET | Freq: Every day | ORAL | Status: DC
  Administered 2012-07-21 – 2012-07-22 (×2): 325 mg via ORAL
  Filled 2012-07-21 (×2): qty 1

## 2012-07-21 MED ORDER — 0.9 % SODIUM CHLORIDE (POUR BTL) OPTIME
TOPICAL | Status: DC | PRN
  Administered 2012-07-21: 3000 mL

## 2012-07-21 MED ORDER — OXYCODONE HCL 5 MG PO TABS: 5.0000 mg | ORAL_TABLET | Freq: Once | ORAL | Status: DC | PRN

## 2012-07-21 MED ORDER — DOPAMINE-DEXTROSE 3.2-5 MG/ML-% IV SOLN: 3.0000 ug/kg/min | INTRAVENOUS | Status: DC

## 2012-07-21 MED ORDER — FENTANYL CITRATE 0.05 MG/ML IJ SOLN
INTRAMUSCULAR | Status: DC | PRN
  Administered 2012-07-21: 50 ug via INTRAVENOUS
  Administered 2012-07-21: 200 ug via INTRAVENOUS

## 2012-07-21 MED ORDER — PROPOFOL 10 MG/ML IV BOLUS
INTRAVENOUS | Status: DC | PRN
  Administered 2012-07-21: 80 mg via INTRAVENOUS

## 2012-07-21 MED ORDER — ARTIFICIAL TEARS OP OINT
TOPICAL_OINTMENT | OPHTHALMIC | Status: DC | PRN
  Administered 2012-07-21: 1 via OPHTHALMIC

## 2012-07-21 MED ORDER — SODIUM CHLORIDE 0.9 % IV SOLN: INTRAVENOUS | Status: DC

## 2012-07-21 MED ORDER — SODIUM CHLORIDE 0.9 % IV SOLN
INTRAVENOUS | Status: DC
  Administered 2012-07-21: 15:00:00 via INTRAVENOUS

## 2012-07-21 MED ORDER — AMLODIPINE BESYLATE 10 MG PO TABS
10.0000 mg | ORAL_TABLET | Freq: Every day | ORAL | Status: DC
  Administered 2012-07-22: 10 mg via ORAL
  Filled 2012-07-21: qty 1

## 2012-07-21 MED ORDER — ASPIRIN 325 MG PO TABS: 325.0000 mg | ORAL_TABLET | Freq: Every evening | ORAL | Status: DC

## 2012-07-21 MED ORDER — MAGNESIUM SULFATE 40 MG/ML IJ SOLN
2.0000 g | Freq: Once | INTRAMUSCULAR | Status: AC | PRN
  Filled 2012-07-21: qty 50

## 2012-07-21 MED ORDER — LIDOCAINE HCL (CARDIAC) 20 MG/ML IV SOLN
INTRAVENOUS | Status: DC | PRN
  Administered 2012-07-21: 20 mg via INTRAVENOUS

## 2012-07-21 MED ORDER — MIDAZOLAM HCL 2 MG/2ML IJ SOLN: 0.5000 mg | Freq: Once | INTRAMUSCULAR | Status: DC | PRN

## 2012-07-21 MED ORDER — PHENYLEPHRINE HCL 10 MG/ML IJ SOLN
10.0000 mg | INTRAVENOUS | Status: DC | PRN
  Administered 2012-07-21: 15 ug/min via INTRAVENOUS

## 2012-07-21 MED ORDER — DEXTROSE 5 % IV SOLN
1.5000 g | Freq: Two times a day (BID) | INTRAVENOUS | Status: AC
  Administered 2012-07-21 – 2012-07-22 (×2): 1.5 g via INTRAVENOUS
  Filled 2012-07-21 (×2): qty 1.5

## 2012-07-21 MED ORDER — LISINOPRIL 20 MG PO TABS
20.0000 mg | ORAL_TABLET | Freq: Every day | ORAL | Status: DC
  Administered 2012-07-21 – 2012-07-22 (×2): 20 mg via ORAL
  Filled 2012-07-21 (×2): qty 1

## 2012-07-21 MED ORDER — EPHEDRINE SULFATE 50 MG/ML IJ SOLN
INTRAMUSCULAR | Status: DC | PRN
  Administered 2012-07-21 (×2): 5 mg via INTRAVENOUS
  Administered 2012-07-21: 10 mg via INTRAVENOUS
  Administered 2012-07-21 (×2): 5 mg via INTRAVENOUS

## 2012-07-21 MED ORDER — HEMOSTATIC AGENTS (NO CHARGE) OPTIME
TOPICAL | Status: DC | PRN
  Administered 2012-07-21: 1 via TOPICAL

## 2012-07-21 MED ORDER — PROMETHAZINE HCL 25 MG/ML IJ SOLN: 6.2500 mg | INTRAMUSCULAR | Status: DC | PRN

## 2012-07-21 MED ORDER — CLOPIDOGREL BISULFATE 75 MG PO TABS
75.0000 mg | ORAL_TABLET | Freq: Every day | ORAL | Status: DC
  Administered 2012-07-21 – 2012-07-22 (×2): 75 mg via ORAL
  Filled 2012-07-21 (×2): qty 1

## 2012-07-21 MED ORDER — MORPHINE SULFATE 2 MG/ML IJ SOLN: 2.0000 mg | INTRAMUSCULAR | Status: DC | PRN

## 2012-07-21 MED ORDER — HEPARIN SODIUM (PORCINE) 1000 UNIT/ML IJ SOLN
INTRAMUSCULAR | Status: DC | PRN
  Administered 2012-07-21: 8000 [IU] via INTRAVENOUS

## 2012-07-21 MED ORDER — BISACODYL 10 MG RE SUPP: 10.0000 mg | Freq: Every day | RECTAL | Status: DC | PRN

## 2012-07-21 MED ORDER — ACETAMINOPHEN 325 MG PO TABS
325.0000 mg | ORAL_TABLET | ORAL | Status: DC | PRN
  Administered 2012-07-22: 325 mg via ORAL
  Filled 2012-07-21: qty 2

## 2012-07-21 MED ORDER — GUAIFENESIN-DM 100-10 MG/5ML PO SYRP: 15.0000 mL | ORAL_SOLUTION | ORAL | Status: DC | PRN

## 2012-07-21 MED ORDER — ROCURONIUM BROMIDE 100 MG/10ML IV SOLN
INTRAVENOUS | Status: DC | PRN
  Administered 2012-07-21: 50 mg via INTRAVENOUS

## 2012-07-21 MED ORDER — SENNOSIDES-DOCUSATE SODIUM 8.6-50 MG PO TABS
1.0000 | ORAL_TABLET | Freq: Every evening | ORAL | Status: DC | PRN
  Filled 2012-07-21: qty 1

## 2012-07-21 MED ORDER — OXYCODONE-ACETAMINOPHEN 5-325 MG PO TABS: 1.0000 | ORAL_TABLET | ORAL | Status: DC | PRN

## 2012-07-21 MED ORDER — ALUM & MAG HYDROXIDE-SIMETH 200-200-20 MG/5ML PO SUSP: 15.0000 mL | ORAL | Status: DC | PRN

## 2012-07-21 MED ORDER — FENTANYL CITRATE 0.05 MG/ML IJ SOLN
25.0000 ug | INTRAMUSCULAR | Status: AC | PRN
  Administered 2012-07-21 (×6): 25 ug via INTRAVENOUS

## 2012-07-21 MED ORDER — ATORVASTATIN CALCIUM 10 MG PO TABS
10.0000 mg | ORAL_TABLET | Freq: Every day | ORAL | Status: DC
  Administered 2012-07-21: 10 mg via ORAL
  Filled 2012-07-21 (×2): qty 1

## 2012-07-21 MED ORDER — SODIUM CHLORIDE 0.9 % IV SOLN: 500.0000 mL | Freq: Once | INTRAVENOUS | Status: AC | PRN

## 2012-07-21 MED ORDER — GLYCOPYRROLATE 0.2 MG/ML IJ SOLN
INTRAMUSCULAR | Status: DC | PRN
  Administered 2012-07-21: 0.6 mg via INTRAVENOUS
  Administered 2012-07-21: 0.1 mg via INTRAVENOUS

## 2012-07-21 MED ORDER — NEOSTIGMINE METHYLSULFATE 1 MG/ML IJ SOLN
INTRAMUSCULAR | Status: DC | PRN
  Administered 2012-07-21: 4 mg via INTRAVENOUS

## 2012-07-21 SURGICAL SUPPLY — 51 items
CANISTER SUCTION 2500CC (MISCELLANEOUS) ×2 IMPLANT
CATH ROBINSON RED A/P 18FR (CATHETERS) ×2 IMPLANT
CATH SUCT 10FR WHISTLE TIP (CATHETERS) ×2 IMPLANT
CLIP TI MEDIUM 24 (CLIP) ×2 IMPLANT
CLIP TI WIDE RED SMALL 24 (CLIP) ×2 IMPLANT
CLOTH BEACON ORANGE TIMEOUT ST (SAFETY) ×2 IMPLANT
COVER SURGICAL LIGHT HANDLE (MISCELLANEOUS) ×2 IMPLANT
CRADLE DONUT ADULT HEAD (MISCELLANEOUS) ×2 IMPLANT
DERMABOND ADVANCED (GAUZE/BANDAGES/DRESSINGS) ×1
DERMABOND ADVANCED .7 DNX12 (GAUZE/BANDAGES/DRESSINGS) ×1 IMPLANT
DRAIN CHANNEL 15F RND FF W/TCR (WOUND CARE) IMPLANT
DRAPE WARM FLUID 44X44 (DRAPE) ×2 IMPLANT
ELECT REM PT RETURN 9FT ADLT (ELECTROSURGICAL) ×2
ELECTRODE REM PT RTRN 9FT ADLT (ELECTROSURGICAL) ×1 IMPLANT
EVACUATOR SILICONE 100CC (DRAIN) IMPLANT
GLOVE BIOGEL M STRL SZ7.5 (GLOVE) ×2 IMPLANT
GLOVE BIOGEL PI IND STRL 6.5 (GLOVE) ×4 IMPLANT
GLOVE BIOGEL PI IND STRL 7.5 (GLOVE) ×4 IMPLANT
GLOVE BIOGEL PI INDICATOR 6.5 (GLOVE) ×4
GLOVE BIOGEL PI INDICATOR 7.5 (GLOVE) ×4
GLOVE ECLIPSE 6.5 STRL STRAW (GLOVE) ×6 IMPLANT
GLOVE SS BIOGEL STRL SZ 7 (GLOVE) ×2 IMPLANT
GLOVE SUPERSENSE BIOGEL SZ 7 (GLOVE) ×2
GLOVE SURG SS PI 7.5 STRL IVOR (GLOVE) ×2 IMPLANT
GOWN PREVENTION PLUS XXLARGE (GOWN DISPOSABLE) ×2 IMPLANT
GOWN STRL NON-REIN LRG LVL3 (GOWN DISPOSABLE) ×6 IMPLANT
HEMOSTAT SNOW SURGICEL 2X4 (HEMOSTASIS) ×2 IMPLANT
HEMOSTAT SURGICEL 2X14 (HEMOSTASIS) IMPLANT
INSERT FOGARTY SM (MISCELLANEOUS) IMPLANT
KIT BASIN OR (CUSTOM PROCEDURE TRAY) ×2 IMPLANT
KIT ROOM TURNOVER OR (KITS) ×2 IMPLANT
NS IRRIG 1000ML POUR BTL (IV SOLUTION) ×6 IMPLANT
PACK CAROTID (CUSTOM PROCEDURE TRAY) ×2 IMPLANT
PAD ARMBOARD 7.5X6 YLW CONV (MISCELLANEOUS) ×4 IMPLANT
PATCH VASCULAR VASCU GUARD 1X6 (Vascular Products) ×2 IMPLANT
SHUNT CAROTID BYPASS 10 (VASCULAR PRODUCTS) IMPLANT
SHUNT CAROTID BYPASS 12FRX15.5 (VASCULAR PRODUCTS) IMPLANT
SPECIMEN JAR SMALL (MISCELLANEOUS) ×2 IMPLANT
SPONGE INTESTINAL PEANUT (DISPOSABLE) ×2 IMPLANT
SUT ETHILON 3 0 PS 1 (SUTURE) IMPLANT
SUT PROLENE 6 0 BV (SUTURE) ×6 IMPLANT
SUT PROLENE 7 0 BV 1 (SUTURE) IMPLANT
SUT PROLENE BLUE 7 0 (SUTURE) ×2 IMPLANT
SUT SILK 3 0 TIES 17X18 (SUTURE)
SUT SILK 3-0 18XBRD TIE BLK (SUTURE) IMPLANT
SUT VIC AB 3-0 SH 27 (SUTURE) ×2
SUT VIC AB 3-0 SH 27X BRD (SUTURE) ×2 IMPLANT
SUT VICRYL 4-0 PS2 18IN ABS (SUTURE) ×2 IMPLANT
TOWEL OR 17X24 6PK STRL BLUE (TOWEL DISPOSABLE) ×2 IMPLANT
TOWEL OR 17X26 10 PK STRL BLUE (TOWEL DISPOSABLE) ×2 IMPLANT
WATER STERILE IRR 1000ML POUR (IV SOLUTION) ×2 IMPLANT

## 2012-07-21 NOTE — Progress Notes (Signed)
Pt admitted from PACU, pt VSS, pt orientated to room, family at [facility], all questions answered

## 2012-07-21 NOTE — Progress Notes (Signed)
Utilization review completed.  

## 2012-07-21 NOTE — Transfer of Care (Signed)
Immediate Anesthesia Transfer of Care Note  Patient: Craig Avery  Procedure(s) Performed: Procedure(s) (LRB) with comments: ENDARTERECTOMY CAROTID (Left) -   PATCH ANGIOPLASTY (Left) - Using 1 cm x 6 cm Vascu Guard patch   Patient Location: PACU  Anesthesia Type:General  Level of Consciousness: awake, alert  and oriented  Airway & Oxygen Therapy: Patient Spontanous Breathing and Patient connected to nasal cannula oxygen  Post-op Assessment: Report given to PACU RN, Post -op Vital signs reviewed and stable and Patient moving all extremities X 4  Post vital signs: Reviewed and stable  Complications: No apparent anesthesia complications

## 2012-07-21 NOTE — Telephone Encounter (Addendum)
Message copied by Shari Prows on Fri Jul 21, 2012  3:40 PM ------      Message from: [place], [place] K      Created: Fri Jul 21, 2012  3:19 PM      Regarding: schedule                   ----- Message -----         From: Dara Lords, PA         Sent: 07/21/2012   2:00 PM           To: Sharee Pimple, CMA            S/p left CEA by Dr. Myra Gianotti 07/21/12.  F/u with him in 2 weeks.            Thanks,      Lelon Mast  I scheduled an appt for the above pt on 07/31/12 at 9:30am w/ [facility]. I relayed the information regarding the appt to his wife and also mailed an appt letter.awt

## 2012-07-21 NOTE — Anesthesia Preprocedure Evaluation (Addendum)
Anesthesia Evaluation  Patient identified by MRN, date of birth, ID band Patient awake    Reviewed: Allergy & Precautions, H&P , NPO status , Patient's Chart, lab work & pertinent test results  History of Anesthesia Complications Negative for: history of anesthetic complications  Airway Mallampati: II TM Distance: >3 FB Neck ROM: Full    Dental  (+) Dental Advisory Given and Edentulous Upper   Pulmonary former smoker,  breath sounds clear to auscultation  Pulmonary exam normal       Cardiovascular hypertension, Pt. on medications + Peripheral Vascular Disease (carotid stenosis) Rhythm:Regular Rate:Normal  '13 ECHO: normal LVF, EF 65-70%, valves OK   Neuro/Psych TIACVA, Residual Symptoms    GI/Hepatic negative GI ROS, Neg liver ROS,   Endo/Other  Morbid obesity  Renal/GU negative Renal ROS     Musculoskeletal   Abdominal (+) + obese,   Peds  Hematology negative hematology ROS (+)   Anesthesia Other Findings   Reproductive/Obstetrics                         Anesthesia Physical Anesthesia Plan  ASA: III  Anesthesia Plan: General   Post-op Pain Management:    Induction: Intravenous  Airway Management Planned: Oral ETT  Additional Equipment: Arterial line  Intra-op Plan:   Post-operative Plan: Extubation in OR  Informed Consent: I have reviewed the patients History and Physical, chart, labs and discussed the procedure including the risks, benefits and alternatives for the proposed anesthesia with the patient or authorized representative who has indicated his/her understanding and acceptance.   Dental advisory given  Plan Discussed with: Anesthesiologist, Surgeon and CRNA  Anesthesia Plan Comments: (Plan routine monitors, A line, GETA )       Anesthesia Quick Evaluation

## 2012-07-21 NOTE — Preoperative (Signed)
Beta Blockers   Reason not to administer Beta Blockers:Not Applicable 

## 2012-07-21 NOTE — Discharge Summary (Signed)
[facility] Discharge Summary   Patient ID:  Craig Avery MRN: [id number] DOB/AGE: 20-Jul-1940 72 y.o.  Admit date: 07/21/2012 Discharge date: 07/21/2012 Date of Surgery:  Surgeon: Surgeon(s): Nada Libman, MD Chuck Hint, MD  Admission Diagnosis: Left Internal Carotid Artery Stenosis  Discharge Diagnoses:  Left Internal Carotid Artery Stenosis  Secondary Diagnoses: Past Medical History  Diagnosis Date  . Stroke 05/2012    R PCA territory, presented with L paresthesias  . HTN (hypertension)   . HLD (hyperlipidemia)   . History of chicken pox   . Obesity   . Left homonymous hemianopsia 05/2012    residual from CVA    Procedure(s): ENDARTERECTOMY CAROTID  Discharged Condition: good  HPI:  The patient is referred today for evaluation of his carotid disease. In November, the patient had several episodes of left-sided numbness. They all went away however 1 day it became very severe while he was at work and therefore he went to the hospital. Workup revealed 2 acute right brain infarcts and chronic left brain infarcts. Ultrasound showed a high-grade left carotid stenosis with a 40-59% right carotid stenosis. The patient was treated medically and discharged. He repositioned in 3 days later with similar symptoms. This was associated with elevated blood pressure. His blood pressure medicines were changed and he was again discharged home. He also underwent a repeat MRI which showed 2 new infarcts on the right. The patient has been on for the past 2 weeks and has been without symptoms. He is medically managed now for his cholesterol with a statin and his blood pressure with multiple medications. He is on single agent antiplatelet therapy with a full dose aspirin.  The patient has a history of smoking but hasn't for approximately 15 years. He has a brother who had heart attack at age 62. He is without symptoms currently with the exception of some residual left  lower leg numbness. He has no weakness in either extremity. He has no dysphagia or slurred speech. He denies amaurosis fugax.    Hospital Course:  Craig Avery is a 72 y.o. male is S/P Left Procedure(s): ENDARTERECTOMY CAROTID Extubated: POD # 0 Post-op wounds healing well Pt. Ambulating, voiding and taking PO diet without difficulty. Pt pain controlled with PO pain meds. Labs as below Complications:none  Consults:     Significant Diagnostic Studies: CBC Lab Results  Component Value Date   WBC 8.3 07/14/2012   HGB 13.8 07/14/2012   HCT 40.1 07/14/2012   MCV 90.1 07/14/2012   PLT 224 07/14/2012    BMET    Component Value Date/Time   NA 142 07/14/2012 0826   K 4.1 07/14/2012 0826   CL 105 07/14/2012 0826   CO2 26 07/14/2012 0826   GLUCOSE 103* 07/14/2012 0826   BUN 15 07/14/2012 0826   CREATININE 1.08 07/14/2012 0826   CALCIUM 9.2 07/14/2012 0826   GFRNONAA 66* 07/14/2012 0826   GFRAA 77* 07/14/2012 0826   COAG Lab Results  Component Value Date   INR 0.91 07/14/2012     Disposition:  Discharge to :Home Discharge Orders    Future Appointments: Provider: Department: Dept Phone: Center:   09/18/2012 3:00 PM Antonieta Iba, MD [facility] [place] [phone] [id number]      [place] [place]  Home Medication Instructions [id number]   Printed on:07/21/12 0830  Medication Information                    acetaminophen (TYLENOL) 500 MG tablet  Take 1,000 mg by mouth every 6 (six) hours as needed. For pain           aspirin 325 MG tablet Take 325 mg by mouth every evening.           amLODipine (NORVASC) 10 MG tablet Take 1 tablet (10 mg total) by mouth daily.           Krill Oil 500 MG CAPS Take 1 capsule by mouth daily.           atorvastatin (LIPITOR) 10 MG tablet Take 1 tablet (10 mg total) by mouth daily at 6 PM.           lisinopril (PRINIVIL,ZESTRIL) 20 MG tablet Take 1 tablet (20 mg total) by mouth daily.           clopidogrel (PLAVIX) 75 MG tablet Take 75  mg by mouth daily.           aspirin 81 MG tablet Take 81 mg by mouth daily.            Verbal and written Discharge instructions given to the patient. Wound care per Discharge AVS   Signed: Clinton Gallant [facility] 07/21/2012, 8:30 AM    Agree with above plan DC home today-return to see Dr. Myra Gianotti in 2 weeks

## 2012-07-21 NOTE — Op Note (Signed)
[facility]  Patient name: Craig Avery MRN: [id number] DOB: Sep 14, 1940 Sex: male  07/21/2012 Pre-operative Diagnosis: Asymptomatic   left carotid stenosis Post-operative diagnosis:  Same Surgeon:  Jorge Ny Assistants:  Cari Caraway Procedure:    left carotid Endarterectomy with bovine pericardial patch angioplasty Anesthesia:  General Blood Loss:  See anesthesia record Specimens:  Carotid Plaque to pathology  Findings:  90 %stenosis; Thrombus:  none  Indications:  This is a 72 year old gentleman who was recently in the hospital for acute right brain stroke. During his workup he was found to have a left brain stroke. CT angiography revealed a high-grade stenosis within the left carotid artery. He comes in today for endarterectomy. He has had no symptoms since I last saw him.  Procedure:  The patient was identified in the holding area and taken to [facility] OR ROOM 12  The patient was then placed supine on the table.   General endotrachial anesthesia was administered.  The patient was prepped and draped in the usual sterile fashion.  A time out was called and antibiotics were administered.  The incision was made along the anterior border of the left sternocleidomastoid muscle.  Cautery was used to dissect through the subcutaneous tissue.  The platysma muscle was divided with cautery.  The internal jugular vein was exposed along its anterior medial border.  The common facial vein was exposed and then divided between 2-0 silk ties and metal clips.  The common carotid artery was then circumferentially exposed and encircled with an umbilical tape.  The vagus nerve was identified and protected.  Next sharp dissection was used to expose the external carotid artery and the superior thyroid artery.  The were encircled with a blue vessel loop and a 2-0 silk tie respectively.  Finally, the internal carotid was carefully dissected free.  An umbilical tape was placed around  the internal carotid artery distal to the diseased segment.  The hypoglossal nerve was visualized throughout and protected.  The patient was given systemic heparinization.  A bovine carotid patch was selected and prepared on the back table.  A 10 french shunt was also prepared.  After blood pressure readings were appropriate and the heparin had been given time to circulate, the internal carotid artery was occluded with a baby Gregory clamp.  The external and common carotid arteries were then occluded with vascular clamps and the 2-0 tie tightened on the superior thyroid artery.  A #11 blade was used to make an arteriotomy in the common carotid artery.  This was extended with Potts scissors along the anterior and lateral border of the common and internal carotid artery.  Approximately 90% stenosis was identified.  There was no thrombus identified.  The 10 french shunt was not placed, as the patient had excellent backbleeding.  A kleiner kuntz elevator was used to perform endarterectomy.  An eversion endarterectomy was performed in the external carotid artery.  A good distal endpoint was obtained in the internal carotid artery.  The specimen was removed and sent to pathology.  Heparinized saline was used to irrigate the endarterectomized field.  All potential embolic debris was removed.  Bovine pericardial patch angioplasty was then performed using a running 6-0 Prolene. The common internal and external carotid arteries were all appropriately flushed. The artery was again irrigated with heparin saline.  The anastomosis was then secured. The clamp was first released on the external carotid artery followed by the common carotid artery approximately 30 seconds later, bloodflow was  reestablish through the internal carotid artery.  Next, a hand-held  Doppler was used to evaluate the signals in the common, external, and internal  carotid arteries, all of which had appropriate signals. I then administered  50 mg  protamine. The wound was then irrigated.  After hemostasis was achieved, the carotid sheath was reapproximated with 3-0 Vicryl. The  platysma muscle was reapproximated with running 3-0 Vicryl. The skin  was closed with 4-0 Vicryl. Dermabond was placed on the skin. The  patient was then successfully extubated. His neurologic exam was  similar to his preprocedural exam. The patient was then taken to recovery room  in stable condition. There were no complications.     Disposition:  To PACU in stable condition.  Relevant Operative Details:  The patient's bifurcation was in the mid neck. The hypoglossal nerve was easily visualized. It did not have to be mobilized. The patient had a 90% stenosis secondary to a plaque at the bifurcation. The plaque was ulcerated. A good distal endpoint was obtained. A bovine pericardial patch was utilized. I did not feel a shunt wasn't necessary given the amount of backbleeding from the internal carotid artery. The patient awoken from the operation neurologically intact.  Juleen China, M.D. [facility] [place] Office: [phone] Pager:  [phone]

## 2012-07-21 NOTE — Anesthesia Postprocedure Evaluation (Signed)
  Anesthesia Post-op Note  Patient: Craig Avery  Procedure(s) Performed: Procedure(s) (LRB) with comments: ENDARTERECTOMY CAROTID (Left) -   PATCH ANGIOPLASTY (Left) - Using 1 cm x 6 cm Vascu Guard patch   Patient Location: PACU  Anesthesia Type:General  Level of Consciousness: awake, alert , oriented and patient cooperative  Airway and Oxygen Therapy: Patient Spontanous Breathing and Patient connected to nasal cannula oxygen  Post-op Pain: mild  Post-op Assessment: Post-op Vital signs reviewed, Patient's Cardiovascular Status Stable, Respiratory Function Stable, Patent Airway, No signs of Nausea or vomiting and Pain level controlled  Post-op Vital Signs: Reviewed and stable  Complications: No apparent anesthesia complications

## 2012-07-21 NOTE — Anesthesia Procedure Notes (Signed)
Procedure Name: Intubation Date/Time: 07/21/2012 7:51 AM Performed by: Elon Alas Pre-anesthesia Checklist: Timeout performed, Patient identified, Emergency Drugs available, Suction available and Patient being monitored Patient Re-evaluated:Patient Re-evaluated prior to inductionOxygen Delivery Method: Circle system utilized Preoxygenation: Pre-oxygenation with 100% oxygen Intubation Type: IV induction Ventilation: Mask ventilation without difficulty and Oral airway inserted - appropriate to patient size Laryngoscope Size: Mac and 4 Grade View: Grade III Tube type: Oral Tube size: 7.5 mm Number of attempts: 1 Airway Equipment and Method: Stylet and LTA kit utilized Placement Confirmation: positive ETCO2,  ETT inserted through vocal cords under direct vision and breath sounds checked- equal and bilateral Secured at: 22 cm Tube secured with: Tape Dental Injury: Teeth and Oropharynx as per pre-operative assessment

## 2012-07-21 NOTE — H&P (Signed)
[facility] [place]  Patient name: Craig Avery MRN: [id number] DOB: 1940-10-20 Sex: male  Referred by: Dr. Mariah Milling  Reason for referral:  Chief Complaint   Patient presents with   .  Carotid     New pt, carotid stenosis, Dr. Mariah Milling    HISTORY OF PRESENT ILLNESS:  The patient is referred today for evaluation of his carotid disease. In November, the patient had several episodes of left-sided numbness. They all went away however 1 day it became very severe while he was at work and therefore he went to the hospital. Workup revealed 2 acute right brain infarcts and chronic left brain infarcts. Ultrasound showed a high-grade left carotid stenosis with a 40-59% right carotid stenosis. The patient was treated medically and discharged. He repositioned in 3 days later with similar symptoms. This was associated with elevated blood pressure. His blood pressure medicines were changed and he was again discharged home. He also underwent a repeat MRI which showed 2 new infarcts on the right. The patient has been on for the past 2 weeks and has been without symptoms. He is medically managed now for his cholesterol with a statin and his blood pressure with multiple medications. He is on single agent antiplatelet therapy with a full dose aspirin.  The patient has a history of smoking but hasn't for approximately 15 years. He has a brother who had heart attack at age 76. He is without symptoms currently with the exception of some residual left lower leg numbness. He has no weakness in either extremity. He has no dysphagia or slurred speech. He denies amaurosis fugax.  Past Medical History   Diagnosis  Date   .  Stroke  05/2012     R PCA territory, presented with L paresthesias   .  HTN (hypertension)    .  HLD (hyperlipidemia)    .  History of chicken pox    .  Obesity    .  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  Tee  without cardioversion  05/22/2012     Procedure: TRANSESOPHAGEAL ECHOCARDIOGRAM (TEE); Surgeon: Lewayne Bunting, MD; Location: [facility] ENDOSCOPY; Service: Cardiovascular; Laterality: N/A;   .  Cataract extraction  2004     bilateral   .  Wisdom tooth extraction     History    Social History   .  Marital Status:  Married     Spouse Name:  N/A     Number of Children:  N/A   .  Years of Education:  N/A    Occupational History   .  Not on file.    Social History Main Topics   .  Smoking status:  Former Smoker -- 1.5 packs/day for 33 years     Types:  Cigarettes     Quit date:  06/18/1994   .  Smokeless tobacco:  Never Used   .  Alcohol Use:  6.0 - 7.2 oz/week     10-12 Cans of beer per week   .  Drug Use:  No   .  Sexually Active:  Yes    Other Topics  Concern   .  Not on file    Social History Narrative    Caffeine: light coffee in am, working on cutting backLives with wife, 1 dog. Daughter next door. Other children nearby.Occupation: Music therapist in maintenance deptEdu: HSActivity: no regular exercise,  stays active at work.Diet: not much water, some fruits/vegetables    Family History   Problem  Relation  Age of Onset   .  CAD  Brother  48      MI    .  Heart disease  Brother       before age 67    .  Hypertension  Mother    .  Cirrhosis  Father  75      EtOHic    .  Aneurysm  Paternal Uncle  50   .  Heart disease  Daughter    .  Hyperlipidemia  Daughter     Allergies as of 06/19/2012   .  (No Known Allergies)    Current Outpatient Prescriptions on File Prior to Visit   Medication  Sig  Dispense  Refill   .  acetaminophen (TYLENOL) 500 MG tablet  Take 1,000 mg by mouth every 6 (six) hours as needed. For pain     .  amLODipine (NORVASC) 10 MG tablet  Take 1 tablet (10 mg total) by mouth daily.  30 tablet  2   .  aspirin 325 MG tablet  Take 325 mg by mouth every evening.     Marland Kitchen  atorvastatin (LIPITOR) 10 MG tablet  Take 1 tablet (10 mg total) by mouth daily at 6 PM.   90 tablet  3   .  Krill Oil 500 MG CAPS  Take 1 capsule by mouth daily.     Marland Kitchen  lisinopril (PRINIVIL,ZESTRIL) 20 MG tablet  Take 1 tablet (20 mg total) by mouth daily.  30 tablet  3    REVIEW OF SYSTEMS:  Please see history of present illness, otherwise all systems are negative as documented by the patient  PHYSICAL EXAMINATION:  General: The patient appears their stated age. Vital signs are BP 194/61  Pulse 64  Resp 16  Ht 5\' 4"  (1.626 m)  Wt 193 lb (87.544 kg)  BMI 33.13 kg/m2  SpO2 98%  HEENT: No gross abnormalities  Pulmonary: Respirations are non-labored  Abdomen: Soft and non-tender . Aorta not palpable  Musculoskeletal: There are no major deformities.  Neurologic: No focal weakness or paresthesias are detected,  Skin: There are no ulcer or rashes noted.  Psychiatric: The patient has normal affect.  Cardiovascular: There is a regular rate and rhythm without significant murmur appreciated.  Diagnostic Studies:  The patient comes with a CT scan which revealed a high grade, 95% left carotid stenosis.  Outside Studies/Documentation  Historical records were reviewed. They showed bilateral infarcts, the right side is acute  Medication Changes:  I switched the patient to a baby aspirin, and started him on Plavix  Assessment:  High-grade, asymptomatic left carotid stenosis  Plan:  I spent greater than 90 minutes reviewing the patient's images and discussing this situation with he and his son-in-law. He has acute right brain infarcts, however by ultrasound and CT scan, his right carotid stenosis appears to be less than 50%. A transesophageal echo has been done and was negative. A CT angiogram was ordered last week to evaluate the possibility of considering carotid stenting. This shows a high-grade, 95% left carotid stenosis which is heavily calcified. I told the patient that I do not think based on his CT scan that he would be a good patient for carotid stenting. I have recommended carotid  endarterectomy. I would like to wait another 4-6 weeks for him to resolve the issues he is having with his right brain  stroke. At that time, I feel he needs to undergo left carotid endarterectomy. I discussed the risks and benefits with the patient. These include the risk of stroke and the risk of nerve injury. We also discussed bleeding and cardiopulmonary complications. The patient like to get back to work soon after his operation. I told him I would see him in followup one week after his operation which has been scheduled for Friday, January 10. I changed his medications from a full dose aspirin to aspirin 81 mg and Plavix  V. Charlena Cross, M.D.  [facility] [place]  Office: [phone]  Pager: [phone]

## 2012-07-21 NOTE — Progress Notes (Signed)
Pt admitted to Short Stay.  Initial BP 202/78. Retaken, 198/79.  M. Lin Givens, Nurse manager, notified and stated since Systolic is under 200, no need to call anesthesiologist. Awaiting CXR.

## 2012-07-22 LAB — BASIC METABOLIC PANEL
Calcium: 7.9 mg/dL — ABNORMAL LOW (ref 8.4–10.5)
Creatinine, Ser: 1.26 mg/dL (ref 0.50–1.35)
GFR calc Af Amer: 64 mL/min — ABNORMAL LOW (ref >90)

## 2012-07-22 LAB — CBC
MCH: 30.9 pg (ref 26.0–34.0)
MCV: 92.2 fL (ref 78.0–100.0)
Platelets: 163 10*3/uL (ref 150–400)
RDW: 13.5 % (ref 11.5–15.5)

## 2012-07-22 NOTE — Discharge Summary (Signed)
[facility] Discharge Summary  Craig Avery April 17, 1941 72 y.o. male  [id number]  Admission Date: 07/21/2012  Discharge Date: 07/22/12  Physician: Nada Libman, MD  Admission Diagnosis: Left Internal Carotid Artery Stenosis   HPI:   This is a 72 y.o. male is referred today for evaluation of his carotid disease. In November, the patient had several episodes of left-sided numbness. They all went away however 1 day it became very severe while he was at work and therefore he went to the hospital. Workup revealed 2 acute right brain infarcts and chronic left brain infarcts. Ultrasound showed a high-grade left carotid stenosis with a 40-59% right carotid stenosis. The patient was treated medically and discharged. He repositioned in 3 days later with similar symptoms. This was associated with elevated blood pressure. His blood pressure medicines were changed and he was again discharged home. He also underwent a repeat MRI which showed 2 new infarcts on the right. The patient has been on for the past 2 weeks and has been without symptoms. He is medically managed now for his cholesterol with a statin and his blood pressure with multiple medications. He is on single agent antiplatelet therapy with a full dose aspirin.  The patient has a history of smoking but hasn't for approximately 15 years. He has a brother who had heart attack at age 3. He is without symptoms currently with the exception of some residual left lower leg numbness. He has no weakness in either extremity. He has no dysphagia or slurred speech. He denies amaurosis fugax.   Hospital Course:  The patient was admitted to the hospital and taken to the operating room on 07/21/2012 and underwent left carotid endarterectomy.  The pt tolerated the procedure well and was transported to the PACU in good condition.   By POD 1, the pt neuro status his neuro status is in tact.  He denies difficulty swallowing.  The remainder  of the hospital course consisted of increasing mobilization and increasing intake of solids without difficulty.    Basename 07/22/12 0345  NA 140  K 4.1  CL 105  CO2 25  GLUCOSE 112*  BUN 21  CALCIUM 7.9*    Basename 07/22/12 0345  WBC 8.3  HGB 10.7*  HCT 31.9*  PLT 163   No results found for this basename: INR:2 in the last 72 hours   Discharge Instructions:   The patient is discharged to home with extensive instructions on wound care and progressive ambulation.  They are instructed not to drive or perform any heavy lifting until returning to see the physician in his office.  Discharge Orders    Future Appointments: Provider: Department: Dept Phone: Center:   07/31/2012 9:30 AM Nada Libman, MD [facility] [phone] [facility]   09/18/2012 3:00 PM Antonieta Iba, MD [facility] [place] [phone] [email or web address]     Future Orders Please Complete By Expires   Resume previous diet      Driving Restrictions      Comments:   No driving for 2 weeks   Lifting restrictions      Comments:   No lifting for 4 weeks   Call MD for:  temperature >100.5      Call MD for:  redness, tenderness, or signs of infection (pain, swelling, bleeding, redness, odor or green/yellow discharge around incision site)      Call MD for:  severe or increased pain, loss or decreased feeling  in affected limb(s)  CAROTID Sugery: Call MD for difficulty swallowing or speaking; weakness in arms or legs that is a new symtom; severe headache.  If you have increased swelling in the neck and/or  are having difficulty breathing, CALL 911      Discharge wound care:      Comments:   Shower daily with soap and water starting 07/23/12      Discharge Diagnosis:  Left Internal Carotid Artery Stenosis  Secondary Diagnosis: Patient Active Problem List  Diagnosis  . HTN (hypertension)  . History of ischemic right PCA stroke  . Carotid stenosis, bilateral  . TIA  (transient ischemic attack)  . Hyperlipidemia  . Obesity  . Left homonymous hemianopsia  . Occlusion and stenosis of carotid artery without mention of cerebral infarction   Past Medical History  Diagnosis Date  . Stroke 05/2012    R PCA territory, presented with L paresthesias  . HTN (hypertension)   . HLD (hyperlipidemia)   . History of chicken pox   . Obesity   . Left homonymous hemianopsia 05/2012    residual from CVA     Craig Avery, Craig Avery  [facility] Medication Instructions [id number]   Printed on:07/22/12 0737  Medication Information                    acetaminophen (TYLENOL) 500 MG tablet Take 1,000 mg by mouth every 6 (six) hours as needed. For pain           amLODipine (NORVASC) 10 MG tablet Take 1 tablet (10 mg total) by mouth daily.           Krill Oil 500 MG CAPS Take 1 capsule by mouth daily.           atorvastatin (LIPITOR) 10 MG tablet Take 1 tablet (10 mg total) by mouth daily at 6 PM.           lisinopril (PRINIVIL,ZESTRIL) 20 MG tablet Take 1 tablet (20 mg total) by mouth daily.           clopidogrel (PLAVIX) 75 MG tablet Take 75 mg by mouth daily.           aspirin 81 MG tablet Take 81 mg by mouth daily.           oxyCODONE-acetaminophen (PERCOCET/ROXICET) 5-325 MG per tablet Take 1-2 tablets by mouth every 4 (four) hours as needed.             Disposition: home  Patient's condition: is Good  Follow up: 1. Dr.  Myra Gianotti in 2 weeks.   Doreatha Massed, PA-C [facility] [phone] 07/22/2012  7:37 AM   agree with the above plan Left neck wound looks good Neurologic exam normal Voiding well-ambulating well-good appetite DC home today

## 2012-07-22 NOTE — Progress Notes (Signed)
Pt d/c home per MD order, pt VSS, family at [facility], pt and family verbalized understanding of d/c, instructions and prescription given, all questions answered

## 2012-07-22 NOTE — Progress Notes (Signed)
[facility] Progress Note  07/22/2012 7:29 AM POD 1  Subjective:  Doing well this am.  RN states he drops his sats a bit when laying down.  He is now on room air.  Afebrile VSS 96%2LO2NC  Filed Vitals:   07/22/12 0400  BP: 117/46  Pulse:   Temp: 98.4 F (36.9 C)  Resp:      Physical Exam: Neuro:  In tact. Incision:  C/d/i  CBC    Component Value Date/Time   WBC 8.3 07/22/2012 0345   RBC 3.46* 07/22/2012 0345   HGB 10.7* 07/22/2012 0345   HCT 31.9* 07/22/2012 0345   PLT 163 07/22/2012 0345   MCV 92.2 07/22/2012 0345   MCH 30.9 07/22/2012 0345   MCHC 33.5 07/22/2012 0345   RDW 13.5 07/22/2012 0345   LYMPHSABS 2.6 05/18/2012 1029   MONOABS 0.3 05/18/2012 1029   EOSABS 0.1 05/18/2012 1029   BASOSABS 0.0 05/18/2012 1029    BMET    Component Value Date/Time   NA 140 07/22/2012 0345   K 4.1 07/22/2012 0345   CL 105 07/22/2012 0345   CO2 25 07/22/2012 0345   GLUCOSE 112* 07/22/2012 0345   BUN 21 07/22/2012 0345   CREATININE 1.26 07/22/2012 0345   CALCIUM 7.9* 07/22/2012 0345   GFRNONAA 55* 07/22/2012 0345   GFRAA 64* 07/22/2012 0345     Intake/Output Summary (Last 24 hours) at 07/22/12 0729 Last data filed at 07/22/12 0600  Gross per 24 hour  Intake 2971.67 ml  Output      0 ml  Net 2971.67 ml      Assessment/Plan:  This is a 72 y.o. male who is s/p left CEA POD 1  -pt doing well this am -neuro in tact. -discharge home later this am -f/u with Dr. Myra Gianotti in 2 weeks.   Doreatha Massed, PA-C [facility] [phone]

## 2012-07-23 ENCOUNTER — Encounter: Payer: Self-pay | Admitting: Family Medicine

## 2012-07-24 ENCOUNTER — Encounter (HOSPITAL_COMMUNITY): Payer: Self-pay | Admitting: Surgery

## 2012-07-25 ENCOUNTER — Encounter: Payer: Self-pay | Admitting: Neurosurgery

## 2012-07-25 ENCOUNTER — Ambulatory Visit (INDEPENDENT_AMBULATORY_CARE_PROVIDER_SITE_OTHER): Payer: Medicare Other | Admitting: Neurosurgery

## 2012-07-25 VITALS — BP 155/71 | HR 56 | Temp 97.7°F | Resp 16 | Ht 64.5 in | Wt 190.0 lb

## 2012-07-25 DIAGNOSIS — I6529 Occlusion and stenosis of unspecified carotid artery: Secondary | ICD-10-CM

## 2012-07-25 NOTE — Progress Notes (Signed)
Subjective:     Patient ID: Craig Avery, male   DOB: Mar 26, 1941, 72 y.o.   MRN: [id number]  HPI: 72 year old male patient of Dr. Myra Gianotti who underwent a left CEA on January 10. The patient called office asking to be seen due to to a fever he had last night and we brought him in for evaluation. The patient states it is some what hard for him to swallow do to the intubation but he understands this will clear. The patient denies any drainage and has only surgical soreness.   Review of Systems: 12 point review of systems is notable for the difficulties described above otherwise unremarkable     Objective:   Physical Exam: Vital signs are stable, temp is 97.7, the patient states he did reach a temperature of 100.5-101 last night which resolved with Tylenol.     Assessment:     Dr. Hart Rochester reassured the patient the surgical wound does not look infected there is a small amount of fluid collection along the incision line that should resolve. No prescription antibiotics were given    Plan:     The patient is to stay with soft foods for the next several days until the sore throat resolved, he has chosen to use over-the-counter Chloraseptic for his sore throat. He will followup with Dr. Myra Gianotti as scheduled on January 20. The patient's questions were encouraged and answered, he knows to call the office if his temperature continues to spike.  Lauree Chandler ANP  Clinic M.D.: Hart Rochester

## 2012-07-28 ENCOUNTER — Encounter: Payer: Self-pay | Admitting: Surgery

## 2012-07-31 ENCOUNTER — Encounter: Payer: Self-pay | Admitting: Surgery

## 2012-07-31 ENCOUNTER — Ambulatory Visit (INDEPENDENT_AMBULATORY_CARE_PROVIDER_SITE_OTHER): Payer: Medicare Other | Admitting: Surgery

## 2012-07-31 VITALS — BP 172/62 | HR 65 | Temp 97.9°F | Ht 64.5 in | Wt 188.9 lb

## 2012-07-31 DIAGNOSIS — I6529 Occlusion and stenosis of unspecified carotid artery: Secondary | ICD-10-CM

## 2012-07-31 DIAGNOSIS — Z48812 Encounter for surgical aftercare following surgery on the circulatory system: Secondary | ICD-10-CM

## 2012-07-31 NOTE — Addendum Note (Signed)
Addended by: Sharee Pimple on: 07/31/2012 02:50 PM   Modules accepted: Orders

## 2012-07-31 NOTE — Progress Notes (Signed)
The patient is back today for followup. On 07/21/2012, he underwent left carotid endarterectomy with patch angioplasty. Intraoperative findings revealed 90% stenosis. The patient originally presented with a right brain stroke. On his MRI there was evidence of chronic left brain stroke. The right carotid stenosis was around 50% whereas the left carotid stenosis was 95% based on CT imaging. I waited approximately 6-8 weeks following his right brain stroke prior to offering him left carotid endarterectomy. After my consultation with him initially, I changed his antiplatelet therapy from 325 aspirin and to 81 mg aspirin and Plavix. The patient tolerated his operation well and was discharged to home on postoperative day 1. He was seen in the office 4 days later for a fever. This was felt not to be related to his operation. He did have some trouble with swallowing initially, however this was most likely secondary to endotracheal intubation. He is here today without complaints.  On examination he does have a healing ridge under his incision. The incision has completely healed. There is no evidence of drainage or infection. His neurologic exam is similar to previous intervention.  Overall he has done very well following his operation. We did discuss his asymmetric blood pressure findings and his left arm, which is approximately 10 mmHg less than his right arm. I have told him that medication changes should be based on the blood pressure in the arm which is highest. He will continue with aggressive medical therapy. He is scheduled to come back to the office in 6 months for a repeat carotid ultrasound.

## 2012-08-10 DIAGNOSIS — R209 Unspecified disturbances of skin sensation: Secondary | ICD-10-CM | POA: Insufficient documentation

## 2012-08-10 DIAGNOSIS — I6529 Occlusion and stenosis of unspecified carotid artery: Secondary | ICD-10-CM | POA: Diagnosis not present

## 2012-08-10 DIAGNOSIS — I6350 Cerebral infarction due to unspecified occlusion or stenosis of unspecified cerebral artery: Secondary | ICD-10-CM | POA: Diagnosis not present

## 2012-08-10 DIAGNOSIS — H5340 Unspecified visual field defects: Secondary | ICD-10-CM | POA: Insufficient documentation

## 2012-08-15 ENCOUNTER — Ambulatory Visit (INDEPENDENT_AMBULATORY_CARE_PROVIDER_SITE_OTHER): Payer: Medicare Other | Admitting: Family Medicine

## 2012-08-15 ENCOUNTER — Encounter: Payer: Self-pay | Admitting: Family Medicine

## 2012-08-15 VITALS — BP 180/68 | HR 56 | Temp 98.3°F | Wt 192.8 lb

## 2012-08-15 DIAGNOSIS — I10 Essential (primary) hypertension: Secondary | ICD-10-CM | POA: Diagnosis not present

## 2012-08-15 DIAGNOSIS — I6523 Occlusion and stenosis of bilateral carotid arteries: Secondary | ICD-10-CM

## 2012-08-15 DIAGNOSIS — I658 Occlusion and stenosis of other precerebral arteries: Secondary | ICD-10-CM

## 2012-08-15 DIAGNOSIS — E785 Hyperlipidemia, unspecified: Secondary | ICD-10-CM

## 2012-08-15 MED ORDER — AMLODIPINE BESYLATE 10 MG PO TABS: 10.0000 mg | ORAL_TABLET | Freq: Every day | ORAL | Status: DC

## 2012-08-15 MED ORDER — LISINOPRIL-HYDROCHLOROTHIAZIDE 20-12.5 MG PO TABS: 1.0000 | ORAL_TABLET | Freq: Every day | ORAL | Status: DC

## 2012-08-15 NOTE — Patient Instructions (Signed)
Stop lisinopril 20mg  alone.  Start combination medicine lisinopril hydrochlorothiazide 20/12.5mg  daily. Keep an eye on blood pressure as up to now. Ensure staying well hydrated with good water intake. Continue to avoid salt. Good fruits/vegetables daily.

## 2012-08-15 NOTE — Assessment & Plan Note (Signed)
Follows with [facility] for bilat stenosis.  Planned rpt [place] in 5 mo

## 2012-08-15 NOTE — Assessment & Plan Note (Signed)
Chronic, uncontrolled Added hctz to lisinopril today. rtc 3 mo for f/u. Slowly bring bp down.

## 2012-08-15 NOTE — Assessment & Plan Note (Signed)
Stable on lipitor 10mg  daily. Check FLP next blood work.

## 2012-08-15 NOTE — Progress Notes (Signed)
  Subjective:    Patient ID: Craig Avery, male    DOB: [birth year], 72 y.o.   MRN: [id number]  HPI CC: BP f/u  Known bilateral carotid stenosis s/p L CEA.  Since last seen here had L CEA by Dr. Myra Gianotti, has healed well from this.  Plan is to rpt [place] in 6 months.  No HA, vision changes, CP/tightness, SOB, leg swelling.   Compliant with amlodipine 10mg  daily and lisinopril 20mg  daily. BP Readings from Last 3 Encounters:  08/15/12 180/68  07/31/12 172/62  07/25/12 155/71    Lab Results  Component Value Date   CREATININE 1.26 07/22/2012    HLD - lipitor 10mg  nightly compliant.  Denies myalgias.  Past Medical History  Diagnosis Date  . Stroke 05/2012    R PCA territory, presented with L paresthesias  . HTN (hypertension)   . HLD (hyperlipidemia)   . History of chicken pox   . Obesity   . 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 Tee without cardioversion 05/22/2012    Procedure: TRANSESOPHAGEAL ECHOCARDIOGRAM (TEE);  Surgeon: Lewayne Bunting, MD;  Location: [facility] ENDOSCOPY;  Service: Cardiovascular;  Laterality: N/A;  . Cataract extraction 2004    bilateral  . Wisdom tooth extraction   . Carotid endarterectomy 07/2012    L ([place])  . Endarterectomy 07/21/2012    Procedure: ENDARTERECTOMY CAROTID;  Surgeon: Nada Libman, MD;  Location: [facility] OR;  Service: Vascular;  Laterality: Left;     . Patch angioplasty 07/21/2012    Procedure: PATCH ANGIOPLASTY;  Surgeon: Nada Libman, MD;  Location: [facility] OR;  Service: Vascular;  Laterality: Left;  Using 1 cm x 6 cm Vascu Guard patch    Review of Systems Per HPI    Objective:   Physical Exam  Nursing note and vitals reviewed. Constitutional: He appears well-developed and well-nourished. No distress.  HENT:  Head: Normocephalic and atraumatic.  Mouth/Throat: Oropharynx is clear and moist. No oropharyngeal exudate.  Eyes: Conjunctivae normal and EOM are normal. Pupils are  equal, round, and reactive to light. No scleral icterus.  Neck: Normal range of motion. Neck supple. Carotid bruit is present (bilat R>L).  Cardiovascular: Normal rate, regular rhythm, normal heart sounds and intact distal pulses.   No murmur heard. Pulmonary/Chest: Effort normal and breath sounds normal. No respiratory distress. He has no wheezes. He has no rales.  Musculoskeletal: He exhibits no edema.  Skin: Skin is warm and dry. No rash noted.       Assessment & Plan:

## 2012-08-26 ENCOUNTER — Other Ambulatory Visit: Payer: Self-pay

## 2012-08-28 ENCOUNTER — Encounter: Payer: Self-pay | Admitting: Family Medicine

## 2012-08-30 DIAGNOSIS — H53469 Homonymous bilateral field defects, unspecified side: Secondary | ICD-10-CM | POA: Diagnosis not present

## 2012-09-06 ENCOUNTER — Encounter: Payer: Self-pay | Admitting: Family Medicine

## 2012-09-18 ENCOUNTER — Encounter: Payer: Self-pay | Admitting: Cardiovascular Disease

## 2012-09-18 ENCOUNTER — Ambulatory Visit (INDEPENDENT_AMBULATORY_CARE_PROVIDER_SITE_OTHER): Payer: Medicare Other | Admitting: Cardiovascular Disease

## 2012-09-18 VITALS — BP 140/60 | HR 59 | Ht 65.0 in | Wt 192.0 lb

## 2012-09-18 DIAGNOSIS — I10 Essential (primary) hypertension: Secondary | ICD-10-CM | POA: Diagnosis not present

## 2012-09-18 DIAGNOSIS — I6523 Occlusion and stenosis of bilateral carotid arteries: Secondary | ICD-10-CM

## 2012-09-18 DIAGNOSIS — I658 Occlusion and stenosis of other precerebral arteries: Secondary | ICD-10-CM

## 2012-09-18 DIAGNOSIS — E785 Hyperlipidemia, unspecified: Secondary | ICD-10-CM | POA: Diagnosis not present

## 2012-09-18 MED ORDER — LISINOPRIL-HYDROCHLOROTHIAZIDE 20-12.5 MG PO TABS: 2.0000 | ORAL_TABLET | Freq: Two times a day (BID) | ORAL | Status: DC

## 2012-09-18 NOTE — Assessment & Plan Note (Signed)
Recently started on low-dose statin. We have requested a lipid panel in the next several weeks. Goal LDL less than 70

## 2012-09-18 NOTE — Progress Notes (Signed)
Patient ID: Craig Avery, male    DOB: Jan 19, 1941, 72 y.o.   MRN: [id number]  HPI Comments: 72 year old gentleman with history of hypertension and peripheral vascular disease, smoking history , recent hospitalization x2 at [facility], L sided paresthesias, Found to have stroke (acute right PCA distribution ischemic infarction involving the right occipital region and right thalamus along with severe R PCA), returned a few days later to ER hypertensive to >200 systolic,  With medication management for blood pressure  Found to have severe left carotid stenosis, estimated at greater than 70% of the proximal left internal carotid artery.  He has refused open carotid endarterectomy in the past but reconsidered late last year and had left CEA in [place]. Blood pressure at home continues to run high typically in the 150-160 range or higher . Overall he is very happy about the outcome of his carotid surgery.  Prior ultrasound showedCarotid stenosis - R 40-59%, L >80% TEE - vigorous systolic fxn with EF 65-70%, no vegetations/thrombus.  Initial MRI/MRA: 1. Small acute infarcts in the right hippocampus and right occipital lobe, implicating right PCA territory ischemia. No mass effect or hemorrhage.  2. Chronic small vessel ischemia in the left basal ganglia and possibly also the medial left thalamus.   IMPRESSION: 1. High-grade stenosis or focal near occlusion of the right PCA distal P2 segment with attenuated more distal right PCA flow.  2. Moderate to severe focal stenosis of the left PCA P2 segment.  3. 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  4. Mild to moderate anterior circulation atherosclerosis, most pronounced in the MCA M1 segments.  EKG shows normal sinus rhythm with rate 59 beats per minute, no significant ST or T wave changes     Outpatient Encounter Prescriptions as of 09/18/2012  Medication Sig Dispense Refill  . acetaminophen (TYLENOL) 500 MG tablet Take 1,000 mg by  mouth every 6 (six) hours as needed. For pain      . amLODipine (NORVASC) 10 MG tablet Take 1 tablet (10 mg total) by mouth daily.  90 tablet  3  . aspirin 81 MG tablet Take 81 mg by mouth daily.      Marland Kitchen atorvastatin (LIPITOR) 10 MG tablet Take 1 tablet (10 mg total) by mouth daily at 6 PM.  90 tablet  3  . clopidogrel (PLAVIX) 75 MG tablet Take 75 mg by mouth daily.      Craig Avery Oil 500 MG CAPS Take 1 capsule by mouth daily.      Marland Kitchen lisinopril-hydrochlorothiazide (ZESTORETIC) 20-12.5 MG per tablet Take 2 tablets by mouth 2 (two) times daily.  180 tablet  3  . [DISCONTINUED] lisinopril-hydrochlorothiazide (ZESTORETIC) 20-12.5 MG per tablet Take 1 tablet by mouth daily.  90 tablet  3   No facility-administered encounter medications on file as of 09/18/2012.     Review of Systems  Constitutional: Negative.   HENT: Negative.   Eyes: Negative.   Respiratory: Negative.   Cardiovascular: Negative.   Gastrointestinal: Negative.   Musculoskeletal: Negative.   Skin: Negative.   Neurological: Negative.   Psychiatric/Behavioral: Negative.   All other systems reviewed and are negative.    BP 140/60  Pulse 59  Ht 5\' 5"  (1.651 m)  Wt 192 lb (87.091 kg)  BMI 31.95 kg/m2  Physical Exam  Nursing note and vitals reviewed. Constitutional: He is oriented to person, place, and time. He appears well-developed and well-nourished.  HENT:  Head: Normocephalic.  Nose: Nose normal.  Mouth/Throat: Oropharynx is clear  and moist.  Eyes: Conjunctivae are normal. Pupils are equal, round, and reactive to light.  Neck: Normal range of motion. Neck supple. No JVD present. Carotid bruit is present.  Well healed surgical incision on the left, s/p CEA  Cardiovascular: Normal rate, regular rhythm, S1 normal, S2 normal, normal heart sounds and intact distal pulses.  Exam reveals no gallop and no friction rub.   No murmur heard. Pulmonary/Chest: Effort normal and breath sounds normal. No respiratory distress. He  has no wheezes. He has no rales. He exhibits no tenderness.  Abdominal: Soft. Bowel sounds are normal. He exhibits no distension. There is no tenderness.  Musculoskeletal: Normal range of motion. He exhibits no edema and no tenderness.  Lymphadenopathy:    He has no cervical adenopathy.  Neurological: He is alert and oriented to person, place, and time. Coordination normal.  Skin: Skin is warm and dry. No rash noted. No erythema.  Psychiatric: He has a normal mood and affect. His behavior is normal. Judgment and thought content normal.      Assessment and Plan

## 2012-09-18 NOTE — Assessment & Plan Note (Signed)
Doing well following carotid surgery on the left. Continue aggressive medical management for disease on the right

## 2012-09-18 NOTE — Assessment & Plan Note (Signed)
Blood pressure continues to run high. Recheck blood pressure shows systolic close to 160. We have suggested he increase his lisinopril HCTZ to 1.5 tabs daily. If blood pressure continues to run high, he'll increase this to 2 tabs daily for total of 40/25 mg daily

## 2012-09-18 NOTE — Patient Instructions (Addendum)
You are doing well. Please increase the lisinopril HCTZ to 1 1/2 pills a day (if you can not break the pill, increase to 2 pills a day) If blood pressure continues to run high,  Increase the lisinopril HCTZ to 2 pills a day  Please come in in the next week or so for a cholesterol check  Please call us if you have new issues that need to be addressed before your next appt.  Your physician wants you to follow-up in: 6 months.  You will receive a reminder letter in the mail two months in advance. If you don't receive a letter, please call our office to schedule the follow-up appointment.

## 2012-09-20 ENCOUNTER — Telehealth: Payer: Self-pay

## 2012-09-20 ENCOUNTER — Other Ambulatory Visit: Payer: Self-pay

## 2012-09-20 MED ORDER — LISINOPRIL-HYDROCHLOROTHIAZIDE 20-12.5 MG PO TABS: 1.0000 | ORAL_TABLET | Freq: Two times a day (BID) | ORAL | Status: DC

## 2012-09-20 NOTE — Telephone Encounter (Signed)
pts wife informed Understanding verb 

## 2012-09-20 NOTE — Telephone Encounter (Signed)
Message copied by [facility], MELISSA E on Wed Sep 20, 2012  9:22 AM ------      Message from: Antonieta Iba      Created: Tue Sep 19, 2012  5:42 PM      Regarding: Medication error       I think I send in the wrong prescription      Can you call the patient and tell them to take lisinopril HCT 20/12.5 mg one tablet twice a day, not 2 tablets twice a day      thx ------

## 2012-09-20 NOTE — Telephone Encounter (Signed)
See below

## 2012-09-25 ENCOUNTER — Ambulatory Visit (INDEPENDENT_AMBULATORY_CARE_PROVIDER_SITE_OTHER): Payer: Medicare Other

## 2012-09-25 DIAGNOSIS — E785 Hyperlipidemia, unspecified: Secondary | ICD-10-CM | POA: Diagnosis not present

## 2012-09-25 NOTE — Addendum Note (Signed)
Addended by: Festus Aloe on: 09/25/2012 08:11 AM   Modules accepted: Level of Service

## 2012-09-26 LAB — LIPID PANEL
Cholesterol, Total: 140 mg/dL (ref 100–199)
HDL: 47 mg/dL (ref >39)
LDL Calculated: 72 mg/dL (ref 0–99)
Triglycerides: 107 mg/dL (ref 0–149)

## 2012-09-26 LAB — HEPATIC FUNCTION PANEL
ALT: 32 IU/L (ref 0–44)
AST: 20 IU/L (ref 0–40)
Total Bilirubin: 0.5 mg/dL (ref 0.0–1.2)

## 2012-11-08 ENCOUNTER — Ambulatory Visit (INDEPENDENT_AMBULATORY_CARE_PROVIDER_SITE_OTHER): Payer: Medicare Other | Admitting: Family Medicine

## 2012-11-08 ENCOUNTER — Telehealth: Payer: Self-pay

## 2012-11-08 ENCOUNTER — Encounter: Payer: Self-pay | Admitting: Nurse Practitioner

## 2012-11-08 ENCOUNTER — Encounter: Payer: Medicare Other | Admitting: Nurse Practitioner

## 2012-11-08 ENCOUNTER — Encounter: Payer: Self-pay | Admitting: Family Medicine

## 2012-11-08 VITALS — BP 160/76 | HR 68 | Wt 188.0 lb

## 2012-11-08 VITALS — BP 166/70 | HR 62 | Temp 97.6°F | Ht 65.0 in | Wt 188.0 lb

## 2012-11-08 DIAGNOSIS — R112 Nausea with vomiting, unspecified: Secondary | ICD-10-CM | POA: Diagnosis not present

## 2012-11-08 MED ORDER — PROMETHAZINE HCL 25 MG PO TABS: 25.0000 mg | ORAL_TABLET | Freq: Three times a day (TID) | ORAL | Status: DC | PRN

## 2012-11-08 MED ORDER — PROMETHAZINE HCL 50 MG/ML IJ SOLN
50.0000 mg | Freq: Once | INTRAMUSCULAR | Status: AC
  Administered 2012-11-08: 50 mg via INTRAMUSCULAR

## 2012-11-08 NOTE — Patient Instructions (Addendum)
I suspect you have a GI virus or some vertigo  Phenergan shot now for nausea  When able - start sipping clear fluids and advance diet very slowly  Move / change position slowly - this may make you more dizzy If worse dizziness or headache or any stroke symptoms - go to ER or call 911 If fever or other symptoms - please let us know  I sent px for oral phenergan to your pharmacy to use as needed

## 2012-11-08 NOTE — Progress Notes (Signed)
Subjective:    Patient ID: Craig Avery, male    DOB: 27-Dec-1940, 72 y.o.   MRN: [id number]  HPI Here with n/v  Woke up with nausea at 3 am - then started vomiting and dry heaving at 3:30  Tried to eat 2 pc of toast this am - did not stay down  Not able to get much fluid  No diarrhea  No abd pain  Not around anyone sick   No new medicines or foods  No otc meds   Was dizzy when this started - that is improved   Drank 2 beers last night- this is his normal   Went to neurologist for f/u today and he vomited so they sent him over here due to vomiting   Patient Active Problem List   Diagnosis Date Noted  . Visual field defect, unspecified 08/10/2012  . 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 Disturbance of skin sensation 08/10/2012  . Occlusion and stenosis of carotid artery without mention of cerebral infarction 06/19/2012  . Obesity   . TIA (transient ischemic attack) 05/21/2012  . Hyperlipidemia 05/21/2012  . HTN (hypertension) 05/18/2012  . History of ischemic right PCA stroke 05/18/2012  . Carotid stenosis, bilateral 05/18/2012  . Left homonymous hemianopsia 05/12/2012   Past Medical History  Diagnosis Date  . Stroke 05/2012    R PCA territory, presented with L paresthesias  . HTN (hypertension)   . HLD (hyperlipidemia)   . History of chicken pox   . Obesity   . 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 Carotid stenosis 07/2012    bilateral, s/p L CEA, plan rpt [place] 6 mo   Past Surgical History  Procedure Laterality Date  . Tee without cardioversion  05/22/2012    Procedure: TRANSESOPHAGEAL ECHOCARDIOGRAM (TEE);  Surgeon: Lewayne Bunting, MD;  Location: [facility] ENDOSCOPY;  Service: Cardiovascular;  Laterality: N/A;  . Cataract extraction  2004    bilateral  . Wisdom tooth extraction    . Endarterectomy  07/21/2012    Left CEA; Surgeon: Nada Libman, MD  .  Patch angioplasty  07/21/2012    Procedure: PATCH ANGIOPLASTY;  Surgeon: Nada Libman, MD;  Location: [facility] OR;  Service: Vascular;  Laterality: Left;  Using 1 cm x 6 cm Vascu Guard patch    History  Substance Use Topics  . Smoking status: Former Smoker -- 1.50 packs/day for 33 years    Types: Cigarettes    Quit date: 06/18/1994  . Smokeless tobacco: Never Used  . Alcohol Use: 6 - 7.2 oz/week    10-12 Cans of beer per week     Comment: Consumes 5 beers per week   Family History  Problem Relation Age of Onset  . CAD Brother 48    MI  . Heart disease Brother     before age 81  . Hypertension Mother   . Cirrhosis Father 80    EtOHic  . Aneurysm Paternal Uncle 50  . Heart disease Daughter   . Hyperlipidemia Daughter    No Known Allergies Current Outpatient Prescriptions on File Prior to Visit  Medication Sig Dispense Refill  . acetaminophen (TYLENOL) 500 MG tablet Take 1,000 mg by mouth every 6 (six) hours as needed. For pain      . amLODipine (NORVASC) 10 MG tablet Take 1 tablet (10 mg total) by mouth daily.  90 tablet  3  .  aspirin 81 MG tablet Take 81 mg by mouth daily.      Marland Kitchen atorvastatin (LIPITOR) 10 MG tablet Take 1 tablet (10 mg total) by mouth daily at 6 PM.  90 tablet  3  . clopidogrel (PLAVIX) 75 MG tablet Take 75 mg by mouth daily.      Boris Lown Oil 500 MG CAPS Take 1 capsule by mouth daily.      Marland Kitchen lisinopril-hydrochlorothiazide (ZESTORETIC) 20-12.5 MG per tablet Take 1 tablet by mouth 2 (two) times daily.  180 tablet  3   No current facility-administered medications on file prior to visit.    Review of Systems Review of Systems  Constitutional: Negative for fever, appetite change, fatigue and unexpected weight change.  Eyes: Negative for pain and visual disturbance.  Respiratory: Negative for cough and shortness of breath.   Cardiovascular: Negative for cp or palpitations    Gastrointestinal: Negative for abdominal pain / diarrhea/ constipation/ blood in stool .   Genitourinary: Negative for urgency and frequency.  Skin: Negative for pallor or rash   Neurological: Negative for weakness, light-headedness, numbness and headaches.  Hematological: Negative for adenopathy. Does not bruise/bleed easily.  Psychiatric/Behavioral: Negative for dysphoric mood. The patient is not nervous/anxious.         Objective:   Physical Exam  Constitutional: He appears well-developed and well-nourished. No distress.  overwt male - sitting with emesis basin- occ vomits scant amount of yellow fluid   HENT:  Head: Normocephalic and atraumatic.  Mouth/Throat: Oropharynx is clear and moist. No oropharyngeal exudate.  Eyes: Conjunctivae and EOM are normal. Pupils are equal, round, and reactive to light. Right eye exhibits no discharge. Left eye exhibits no discharge. No scleral icterus.  Few beats of horizontal nystagmus bilaterally  Neck: Normal range of motion. Neck supple.  Cardiovascular: Normal rate and regular rhythm.   Pulmonary/Chest: Effort normal and breath sounds normal.  Abdominal: Soft. He exhibits no distension and no mass. There is no tenderness. There is no rebound and no guarding.  bs are mildly hyperactive -not high pitched or tinkling   Musculoskeletal: He exhibits no edema.  Lymphadenopathy:    He has no cervical adenopathy.  Neurological: He is alert. He has normal reflexes. He displays no tremor. No cranial nerve deficit or sensory deficit. He exhibits normal muscle tone. Coordination normal.  Gait not observed   Skin: Skin is warm and dry. No rash noted. No erythema. No pallor.  No jaundice  Brisk capillary refil time and nl skin turgor  Psychiatric: He has a normal mood and affect.  Despite vomiting- pt is pleasant an din good spirits           Assessment & Plan:

## 2012-11-08 NOTE — Progress Notes (Signed)
  This encounter was created in error - please disregard.  This encounter was created in error - please disregard.  This encounter was created in error - please disregard.  This encounter was created in error - please disregard. 

## 2012-11-08 NOTE — Telephone Encounter (Signed)
He was seen

## 2012-11-08 NOTE — Telephone Encounter (Signed)
Pt is on way to office now; this morning dizziness and N&V started.  That is all the info pts daughter knows now; pts son in law is bringing to office.Pt will be worked in to see Dr Milinda Antis around 3 pm. Pts daughter voiced understanding. Pt is here and does have h/a but no CP.

## 2012-11-09 NOTE — Progress Notes (Signed)
I want them to take him on in to the ER then - I would have expected more improvement and he probably needs some IV fluids and stat labs  - please see where he wants to go so you can call triage and let them know- also send most recent office note- thanks

## 2012-11-09 NOTE — Assessment & Plan Note (Signed)
This occurred abruptly at 3 am and continues -unable to keep down toast (has had sips of fluids), with some dizziness at onset  Reassuring exam with no signs of dehydration and stable vitals - disc s/s of dehydration in detail with family  Suspect he has viral gastroenteritis / or possibly vertigo- but dizziness is improved  Will watch carefully Phenergan 50 mg IM given and px for 25 mg tabs -disc poss side eff  Will watch carefully Disc gradual fluid rehydration when nausea is improved  Will watch carefully for any neurol changes

## 2012-11-13 ENCOUNTER — Ambulatory Visit (INDEPENDENT_AMBULATORY_CARE_PROVIDER_SITE_OTHER): Payer: Medicare Other | Admitting: Family Medicine

## 2012-11-13 ENCOUNTER — Encounter: Payer: Self-pay | Admitting: Family Medicine

## 2012-11-13 VITALS — BP 144/82 | HR 88 | Temp 97.9°F | Wt 184.8 lb

## 2012-11-13 DIAGNOSIS — E785 Hyperlipidemia, unspecified: Secondary | ICD-10-CM

## 2012-11-13 DIAGNOSIS — I10 Essential (primary) hypertension: Secondary | ICD-10-CM

## 2012-11-13 DIAGNOSIS — R112 Nausea with vomiting, unspecified: Secondary | ICD-10-CM

## 2012-11-13 MED ORDER — MECLIZINE HCL 25 MG PO TABS: 25.0000 mg | ORAL_TABLET | Freq: Three times a day (TID) | ORAL | Status: DC | PRN

## 2012-11-13 NOTE — Progress Notes (Addendum)
Subjective:    Patient ID: Craig Avery, male    DOB: [birth year], 72 y.o.   MRN: [id number]  HPI CC: 3 mo f/u  Craig Avery presents today with his granddaughter as 3 month follow up.  Since last seen here, he was seen by my partner last wek with concern for acute viral gastroenteritis, treated with phenergan shot and PO phenergan at home.  Did not improve as expected so latest advised to seek ER care (concern for dehydration).  Did not seek urgent care.  When he is sitting, feels better.  When standing up, starts getting nauseated, dizzy.  Endorses vertigo > presyncope.  No LOC.  Endorses right sided headache from periorbital area to posterior occiput.  Endorses mild cough that has been going on for the last week.  Endorses congestion, rhinorrhea, PNdrainage.  Some constipation.  Appetite down, not eating well. Throughout this, has not had abd pain or diarrhea or voiding changes.  No fevers/chills. Wife sick with resp illness as well. Has been unable to work for last 5 days (very unlike him).  Ex smoker.  HTN - improving readings with latest addition of HCTZ.  No vision changes, CP/tightness, SOB, leg swelling.  Endorses at home BP running higher - 160s systolic. BP Readings from Last 3 Encounters:  11/13/12 144/82  11/08/12 166/70  11/08/12 160/76    At recent hospitalization he screened positive for OSA with stop bang questionairre.  Feels rested when he awakens.  No daytime sleepiness.  + snoring but no apneic or PNdyspneic episodes.  Past Medical History  Diagnosis Date  . Stroke 05/2012    R PCA territory, presented with L paresthesias  . HTN (hypertension)   . HLD (hyperlipidemia)   . History of chicken pox   . Obesity   . 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 Carotid stenosis 07/2012    bilateral, s/p L CEA, plan rpt [place] 6 mo    Past Surgical History  Procedure Laterality Date  . Tee without  cardioversion  05/22/2012    Procedure: TRANSESOPHAGEAL ECHOCARDIOGRAM (TEE);  Surgeon: Lewayne Bunting, MD;  Location: [facility] ENDOSCOPY;  Service: Cardiovascular;  Laterality: N/A;  . Cataract extraction  2004    bilateral  . Wisdom tooth extraction    . Endarterectomy  07/21/2012    Left CEA; Surgeon: Nada Libman, MD  . Patch angioplasty  07/21/2012    Procedure: PATCH ANGIOPLASTY;  Surgeon: Nada Libman, MD;  Location: [facility] OR;  Service: Vascular;  Laterality: Left;  Using 1 cm x 6 cm Vascu Guard patch     Review of Systems Per HPI    Objective:   Physical Exam  Nursing note and vitals reviewed. Constitutional: He is oriented to person, place, and time. He appears well-developed and well-nourished. No distress.  HENT:  Head: Normocephalic and atraumatic.  Nose: Mucosal edema present. No rhinorrhea.  Mouth/Throat: Oropharynx is clear and moist. No oropharyngeal exudate.  Purulent mucosa in nares  Eyes: Conjunctivae and EOM are normal. Pupils are equal, round, and reactive to light. No scleral icterus.  No nystagmus noted  Neck: Normal range of motion. Neck supple. Carotid bruit is present (R bruit).  Cardiovascular: Normal rate, regular rhythm, normal heart sounds and intact distal pulses.   No murmur heard. Pulmonary/Chest: Effort normal and breath sounds normal. No respiratory distress. He has no wheezes. He has no rales.  Abdominal: Soft. Bowel sounds are  normal. He exhibits no distension and no mass. There is no tenderness. There is no rebound and no guarding.  Musculoskeletal: He exhibits no edema.  Lymphadenopathy:    He has no cervical adenopathy.  Neurological: He is alert and oriented to person, place, and time. He has normal strength. No cranial nerve deficit or sensory deficit. He displays a negative Romberg sign. Gait abnormal. Coordination normal.  CN 2-12 intact Normal FTN No pronator drift Slightly imbalanced gait  Skin: Skin is warm and dry. No rash noted.        Assessment & Plan:

## 2012-11-13 NOTE — Assessment & Plan Note (Signed)
Improving as evidenced by today's #s, however pt states continues to run high at hoe.  No changes today. Consider testing for OSA although pt denies sxs.

## 2012-11-13 NOTE — Assessment & Plan Note (Signed)
Chronic, stable. Controlling on lipitor.  LDL 72 last check.  Goal <70.

## 2012-11-13 NOTE — Patient Instructions (Addendum)
Small sips throughout the day - water, gatorade, ginger ale or sprite - to prevent dehydration. Let's give this a few more days - if not improving or any worsening, seek care or let me know for repeat scan of head to rule out another stroke event. I think you have post infections or post viral vertigo - should get better over next few weeks. Use meclizine for nausea and dizziness - sent into pharmacy (may use this instead of phenergan). Call me with an update in 2-3 days. Return to see me in 1-2 months once feeling better.

## 2012-11-13 NOTE — Assessment & Plan Note (Signed)
With vertigo.  Nausea/vomiting has improved with phenergan, but dizziness/nausea with movement persists.  Anticipate vestibular neuritis type picture. Less likely rpt ischemic event - not clearly central vertigo, anticipate more peripheral. Discussed dx and ddx - will wait a few more days as seems to be improving - if worsens to seek urgent care.  If persistent, low threshold to obtain head CT in known recent CVA. Pt agrees with plan.

## 2012-12-12 ENCOUNTER — Encounter: Payer: Self-pay | Admitting: Family Medicine

## 2012-12-12 ENCOUNTER — Ambulatory Visit (INDEPENDENT_AMBULATORY_CARE_PROVIDER_SITE_OTHER): Payer: Medicare Other | Admitting: Family Medicine

## 2012-12-12 VITALS — BP 140/52 | HR 60 | Temp 98.1°F | Wt 186.5 lb

## 2012-12-12 DIAGNOSIS — T148XXA Other injury of unspecified body region, initial encounter: Secondary | ICD-10-CM

## 2012-12-12 DIAGNOSIS — R5383 Other fatigue: Secondary | ICD-10-CM

## 2012-12-12 DIAGNOSIS — R5381 Other malaise: Secondary | ICD-10-CM | POA: Diagnosis not present

## 2012-12-12 DIAGNOSIS — J3489 Other specified disorders of nose and nasal sinuses: Secondary | ICD-10-CM | POA: Diagnosis not present

## 2012-12-12 DIAGNOSIS — R209 Unspecified disturbances of skin sensation: Secondary | ICD-10-CM | POA: Diagnosis not present

## 2012-12-12 LAB — COMPREHENSIVE METABOLIC PANEL
ALT: 27 U/L (ref 0–53)
Alkaline Phosphatase: 54 U/L (ref 39–117)
CO2: 27 mEq/L (ref 19–32)
Creatinine, Ser: 1.5 mg/dL (ref 0.4–1.5)
GFR: 49.05 mL/min — ABNORMAL LOW (ref >60.00)
Total Bilirubin: 0.6 mg/dL (ref 0.3–1.2)

## 2012-12-12 LAB — CBC WITH DIFFERENTIAL/PLATELET
Basophils Relative: 0.5 % (ref 0.0–3.0)
Eosinophils Absolute: 0.2 10*3/uL (ref 0.0–0.7)
HCT: 32 % — ABNORMAL LOW (ref 39.0–52.0)
Hemoglobin: 11 g/dL — ABNORMAL LOW (ref 13.0–17.0)
Lymphocytes Relative: 30.3 % (ref 12.0–46.0)
MCHC: 34.5 g/dL (ref 30.0–36.0)
Monocytes Relative: 5.8 % (ref 3.0–12.0)
Neutro Abs: 5.3 10*3/uL (ref 1.4–7.7)
RBC: 3.48 Mil/uL — ABNORMAL LOW (ref 4.22–5.81)

## 2012-12-12 LAB — VITAMIN B12: Vitamin B-12: 545 pg/mL (ref 211–911)

## 2012-12-12 NOTE — Patient Instructions (Addendum)
For drainage - may use loratadine or claritin over the counter.  Zyrtec can make you sleepy.  Avoid decongestants like pseudophed or phenylephrine If you're taking amlodipine during the day, change over to night time to see if fatigue improves. Blood work today to check for reversible causes Good to see you today, call us with questions.

## 2012-12-12 NOTE — Progress Notes (Signed)
  Subjective:    Patient ID: Craig Avery, male    DOB: February 08, 1941, 72 y.o.   MRN: [id number]  HPI CC: fatigue  Continues to work - Music therapist, works Diplomatic Services operational officer.  Active job.  Works at [employer].  Over last several months (since January after CEA) notices worsening fatigue.  Every afternoon after lunch feels predominant sense of fatigue and some somnolence.  No nausea. Occasional dizziness, worse with looking up.  Also endorses occasional pain under right shoulder blade - tightness/pulling.  Improves with certain positions.  Denies inciting fall/trauma.  Home BP - 110-130 at work, 120 at home.    Noticing some sinus drainage over last few weeks - PNDrainage and rhinorhea - bought antihistamine which seemed to help.  Wt Readings from Last 3 Encounters:  12/12/12 186 lb 8 oz (84.596 kg)  11/13/12 184 lb 12 oz (83.802 kg)  11/08/12 188 lb (85.276 kg)     Past Medical History  Diagnosis Date  . Stroke 05/2012    R PCA territory, presented with L paresthesias  . HTN (hypertension)   . HLD (hyperlipidemia)   . History of chicken pox   . Obesity   . 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 Carotid stenosis 07/2012    bilateral, s/p L CEA, plan rpt [place] 6 mo    Past Surgical History  Procedure Laterality Date  . Tee without cardioversion  05/22/2012    Procedure: TRANSESOPHAGEAL ECHOCARDIOGRAM (TEE);  Surgeon: Lewayne Bunting, MD;  Location: [facility] ENDOSCOPY;  Service: Cardiovascular;  Laterality: N/A;  . Cataract extraction  2004    bilateral  . Wisdom tooth extraction    . Endarterectomy  07/21/2012    Left CEA; Surgeon: Nada Libman, MD  . Patch angioplasty  07/21/2012    Procedure: PATCH ANGIOPLASTY;  Surgeon: Nada Libman, MD;  Location: [facility] OR;  Service: Vascular;  Laterality: Left;  Using 1 cm x 6 cm Vascu Guard patch     Review of Systems Per HPI    Objective:   Physical Exam  Nursing note and  vitals reviewed. Constitutional: He appears well-developed and well-nourished. No distress.  HENT:  Head: Normocephalic and atraumatic.  Mouth/Throat: Oropharynx is clear and moist. No oropharyngeal exudate.  Neck: Normal range of motion. Neck supple. Carotid bruit is present (R bruit ).  Cardiovascular: Normal rate, regular rhythm, normal heart sounds and intact distal pulses.   No murmur heard. Pulmonary/Chest: Effort normal and breath sounds normal. No respiratory distress. He has no wheezes. He has no rales.  Musculoskeletal: He exhibits no edema.  Tender to palpation and tightness of right thoracic paraspinous mm  Skin: Skin is warm and dry. No rash noted.  Psychiatric: He has a normal mood and affect.       Assessment & Plan:

## 2012-12-13 ENCOUNTER — Encounter: Payer: Self-pay | Admitting: Family Medicine

## 2012-12-13 DIAGNOSIS — S29012A Strain of muscle and tendon of back wall of thorax, initial encounter: Secondary | ICD-10-CM | POA: Insufficient documentation

## 2012-12-13 DIAGNOSIS — J3489 Other specified disorders of nose and nasal sinuses: Secondary | ICD-10-CM | POA: Insufficient documentation

## 2012-12-13 DIAGNOSIS — R5383 Other fatigue: Secondary | ICD-10-CM | POA: Insufficient documentation

## 2012-12-13 LAB — VITAMIN D 25 HYDROXY (VIT D DEFICIENCY, FRACTURES): Vit D, 25-Hydroxy: 40 ng/mL (ref 30–89)

## 2012-12-13 NOTE — Assessment & Plan Note (Signed)
Recommended continue antihistamine like claritin or allegra daily.

## 2012-12-13 NOTE — Assessment & Plan Note (Signed)
Check for reversible causes of fatigue. Seems to correlate with commencement of medical regimen - could be antihypertensives or blood thinners. Given good control in blood pressure, I'm hesitant to make major changes to meds. I suggested he change amlodipine to night time if not already taking.   Also discussed long work days after major stroke and possibility of diminishing hours.

## 2012-12-13 NOTE — Assessment & Plan Note (Signed)
Tender at R rhomboids - anticipate strain.  Discussed ice/heating pad.  Discussed stretching exercises - pt declines handout.

## 2012-12-14 ENCOUNTER — Ambulatory Visit: Payer: Medicare Other | Admitting: Family Medicine

## 2013-01-09 DIAGNOSIS — I6529 Occlusion and stenosis of unspecified carotid artery: Secondary | ICD-10-CM

## 2013-01-09 HISTORY — DX: Occlusion and stenosis of unspecified carotid artery: I65.29

## 2013-01-14 ENCOUNTER — Other Ambulatory Visit: Payer: Self-pay | Admitting: Family Medicine

## 2013-02-02 ENCOUNTER — Other Ambulatory Visit: Payer: Self-pay

## 2013-02-02 ENCOUNTER — Encounter: Payer: Self-pay | Admitting: Surgery

## 2013-02-02 MED ORDER — CLOPIDOGREL BISULFATE 75 MG PO TABS: 75.0000 mg | ORAL_TABLET | Freq: Every day | ORAL | Status: DC

## 2013-02-02 NOTE — Telephone Encounter (Signed)
Patients wife notified

## 2013-02-02 NOTE — Telephone Encounter (Signed)
Ok to refill.  Sent in.  plz notify wife.

## 2013-02-02 NOTE — Telephone Encounter (Signed)
Pts wife request refill plavix to [facility], Dr Reece Agar has not filled before; pt usually get filled by Dr Josephina Gip but pt wants all refills from PCP.Please advise.

## 2013-02-05 ENCOUNTER — Other Ambulatory Visit (INDEPENDENT_AMBULATORY_CARE_PROVIDER_SITE_OTHER): Payer: Medicare Other | Admitting: Vascular Surgery

## 2013-02-05 ENCOUNTER — Ambulatory Visit (INDEPENDENT_AMBULATORY_CARE_PROVIDER_SITE_OTHER): Payer: Medicare Other | Admitting: Surgery

## 2013-02-05 ENCOUNTER — Encounter: Payer: Self-pay | Admitting: Surgery

## 2013-02-05 DIAGNOSIS — Z48812 Encounter for surgical aftercare following surgery on the circulatory system: Secondary | ICD-10-CM | POA: Diagnosis not present

## 2013-02-05 DIAGNOSIS — I6529 Occlusion and stenosis of unspecified carotid artery: Secondary | ICD-10-CM

## 2013-02-05 NOTE — Progress Notes (Signed)
[facility]   Patient name: Craig Avery MRN: [id number] DOB: Jan 26, 1941 Sex: male     Chief Complaint  Patient presents with  . Carotid    6 mo f/up with vascular lab study     HISTORY OF PRESENT ILLNESS: The patient is status post left carotid endarterectomy on 07/21/2012 for asymptomatic stenosis. Intraoperative findings included 90% stenosis. He is doing well without complaints today. He denies any neurologic symptoms.  The patient has a history of right brain stroke. He has not had any symptoms since we started him on maximal medical therapy. He has no significant right carotid stenosis. He continues to be a nonsmoker. He is medically managed with dual antiplatelet therapy. He is on a statin for hypercholesterolemia.  Past Medical History  Diagnosis Date  . Stroke 05/2012    R PCA territory, presented with L paresthesias  . HTN (hypertension)   . HLD (hyperlipidemia)   . History of chicken pox   . Obesity   . 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 Carotid stenosis 07/2012    bilateral, s/p L CEA, plan rpt [place] 6 mo    Past Surgical History  Procedure Laterality Date  . Tee without cardioversion  05/22/2012    Procedure: TRANSESOPHAGEAL ECHOCARDIOGRAM (TEE);  Surgeon: Lewayne Bunting, MD;  Location: [facility] ENDOSCOPY;  Service: Cardiovascular;  Laterality: N/A;  . Cataract extraction  2004    bilateral  . Wisdom tooth extraction    . Endarterectomy  07/21/2012    Left CEA; Surgeon: Nada Libman, MD  . Patch angioplasty  07/21/2012    Procedure: PATCH ANGIOPLASTY;  Surgeon: Nada Libman, MD;  Location: [facility] OR;  Service: Vascular;  Laterality: Left;  Using 1 cm x 6 cm Vascu Guard patch   . Carotid endarterectomy Left 07-21-12    cea    History   Social History  . Marital Status: Married    Spouse Name: N/A    Number of Children: N/A  . Years of Education: N/A    Occupational History  . Not on file.   Social History Main Topics  . Smoking status: Former Smoker -- 1.50 packs/day for 33 years    Types: Cigarettes    Quit date: 06/18/1994  . Smokeless tobacco: Never Used  . Alcohol Use: 6 - 7.2 oz/week    10-12 Cans of beer per week     Comment: Consumes 5 beers per week  . Drug Use: No  . Sexually Active: Yes   Other Topics Concern  . Not on file   Social History Narrative   Caffeine: light coffee in am, working on cutting back   Lives with wife, 1 dog.  Daughter next door.  Other children nearby.   Occupation: Music therapist in maintenance dept   Edu: HS   Activity: no regular exercise, stays active at work.   Diet: not much water, some fruits/vegetables     Family History  Problem Relation Age of Onset  . CAD Brother 48    MI  . Heart disease Brother     before age 45  . Hypertension Mother   . Cirrhosis Father 3    EtOHic  . Aneurysm Paternal Uncle 50  . Heart disease Daughter   . Hyperlipidemia Daughter     Allergies as of 02/05/2013  . (No Known Allergies)    Current Outpatient Prescriptions on File Prior  to Visit  Medication Sig Dispense Refill  . acetaminophen (TYLENOL) 500 MG tablet Take 1,000 mg by mouth every 6 (six) hours as needed. For pain      . amLODipine (NORVASC) 10 MG tablet Take 1 tablet (10 mg total) by mouth daily.  90 tablet  3  . aspirin 81 MG tablet Take 81 mg by mouth daily.      Marland Kitchen atorvastatin (LIPITOR) 10 MG tablet Take 1 tablet (10 mg total) by mouth daily at 6 PM.  90 tablet  3  . clopidogrel (PLAVIX) 75 MG tablet Take 1 tablet (75 mg total) by mouth daily.  90 tablet  3  . Krill Oil 500 MG CAPS Take 1 capsule by mouth daily.      Marland Kitchen lisinopril-hydrochlorothiazide (ZESTORETIC) 20-12.5 MG per tablet Take 1 tablet by mouth 2 (two) times daily.  180 tablet  3   No current facility-administered medications on file prior to visit.     REVIEW OF SYSTEMS: Cardiovascular: No chest pain, chest  pressure, palpitations, orthopnea, or dyspnea on exertion. No claudication or rest pain,  No history of DVT or phlebitis. Pulmonary: No productive cough, asthma or wheezing. Neurologic: No weakness, paresthesias, aphasia, or amaurosis. No dizziness. Hematologic: No bleeding problems or clotting disorders. Musculoskeletal: No joint pain or joint swelling. Gastrointestinal: No blood in stool or hematemesis Genitourinary: No dysuria or hematuria. Psychiatric:: No history of major depression. Integumentary: No rashes or ulcers. Constitutional: No fever or chills.  PHYSICAL EXAMINATION:   Vital signs are BP 157/67  Pulse 61  Resp 16  Ht 5' 3.5" (1.613 m)  Wt 190 lb (86.183 kg)  BMI 33.12 kg/m2  SpO2 99% General: The patient appears their stated age. HEENT:  No gross abnormalities Pulmonary:  Non labored breathing Musculoskeletal: There are no major deformities. Neurologic: No focal weakness or paresthesias are detected, Skin: There are no ulcer or rashes noted. Psychiatric: The patient has normal affect. Cardiovascular: There is a regular rate and rhythm without significant murmur appreciated.   Diagnostic Studies Carotid ultrasound was ordered and reviewed today. This is widely patent left carotid endarterectomy site. Right carotid stenosis is less than 40%  Assessment: Status post left carotid endarterectomy Plan: The patient will continue with maximal medical therapy. He was placed on surveillance protocol. Next he will be in one year.  Jorge Ny, M.D. [facility] [place] Office: [phone] Pager:  [phone]

## 2013-02-13 ENCOUNTER — Other Ambulatory Visit: Payer: Self-pay | Admitting: *Deleted

## 2013-03-23 ENCOUNTER — Ambulatory Visit (INDEPENDENT_AMBULATORY_CARE_PROVIDER_SITE_OTHER): Payer: Medicare Other | Admitting: Cardiovascular Disease

## 2013-03-23 ENCOUNTER — Encounter: Payer: Self-pay | Admitting: Cardiovascular Disease

## 2013-03-23 VITALS — BP 160/80 | HR 63 | Ht 64.0 in | Wt 191.5 lb

## 2013-03-23 DIAGNOSIS — I6529 Occlusion and stenosis of unspecified carotid artery: Secondary | ICD-10-CM | POA: Diagnosis not present

## 2013-03-23 DIAGNOSIS — I63219 Cerebral infarction due to unspecified occlusion or stenosis of unspecified vertebral arteries: Secondary | ICD-10-CM

## 2013-03-23 DIAGNOSIS — I10 Essential (primary) hypertension: Secondary | ICD-10-CM | POA: Diagnosis not present

## 2013-03-23 DIAGNOSIS — E785 Hyperlipidemia, unspecified: Secondary | ICD-10-CM

## 2013-03-23 DIAGNOSIS — I6523 Occlusion and stenosis of bilateral carotid arteries: Secondary | ICD-10-CM

## 2013-03-23 DIAGNOSIS — I658 Occlusion and stenosis of other precerebral arteries: Secondary | ICD-10-CM

## 2013-03-23 DIAGNOSIS — E669 Obesity, unspecified: Secondary | ICD-10-CM

## 2013-03-23 MED ORDER — ATORVASTATIN CALCIUM 20 MG PO TABS: 20.0000 mg | ORAL_TABLET | Freq: Every day | ORAL | Status: DC

## 2013-03-23 NOTE — Assessment & Plan Note (Signed)
We have encouraged continued exercise, careful diet management in an effort to lose weight. 

## 2013-03-23 NOTE — Assessment & Plan Note (Signed)
Discussed his cholesterol with him. He would like to avoid any further surgeries. We'll increase the Lipitor to 20 mg daily in an effort to slow any progression of his carotid disease

## 2013-03-23 NOTE — Assessment & Plan Note (Signed)
We'll continue aggressive cholesterol management. Goal LDL less than 70. We'll increase Lipitor from 10 mg up to 20 mg daily

## 2013-03-23 NOTE — Patient Instructions (Addendum)
You are doing well. No medication changes were made.  Please call us if you have new issues that need to be addressed before your next appt.  Your physician wants you to follow-up in: 6 months.  You will receive a reminder letter in the mail two months in advance. If you don't receive a letter, please call our office to schedule the follow-up appointment.   

## 2013-03-23 NOTE — Progress Notes (Signed)
Patient ID: Craig Avery, male    DOB: Oct 23, 1940, 72 y.o.   MRN: [id number]  HPI Comments: 72 year old gentleman with history of hypertension and peripheral vascular disease, smoking history , recent hospitalization x2 at [facility], L sided paresthesias, Found to have stroke (acute right PCA distribution ischemic infarction involving the right occipital region and right thalamus along with severe R PCA), returned a few days later to ER hypertensive to >200 systolic,  With medication management for blood pressure  Found to have severe left carotid stenosis, estimated at greater than 70% of the proximal left internal carotid artery.  He has refused open carotid endarterectomy in the past but reconsidered late last year and had left CEA in [place].  He reports that his blood pressure is running between 130 and 150 systolic. Overall he feels well with no complaints. He is uncertain if he is taking lisinopril HCTZ 1 tab or 2 tabs per day  Prior ultrasound showedCarotid stenosis - R 40-59%, L >80% TEE - vigorous systolic fxn with EF 65-70%, no vegetations/thrombus.  Initial MRI/MRA: 1. Small acute infarcts in the right hippocampus and right occipital lobe, implicating right PCA territory ischemia. No mass effect or hemorrhage.  2. Chronic small vessel ischemia in the left basal ganglia and possibly also the medial left thalamus.   IMPRESSION: 1. High-grade stenosis or focal near occlusion of the right PCA distal P2 segment with attenuated more distal right PCA flow.  2. Moderate to severe focal stenosis of the left PCA P2 segment.  3. 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  4. Mild to moderate anterior circulation atherosclerosis, most pronounced in the MCA M1 segments.  EKG shows normal sinus rhythm with rate 63 beats per minute, no significant ST or T wave changes     Outpatient Encounter Prescriptions as of 03/23/2013  Medication Sig Dispense Refill  . acetaminophen  (TYLENOL) 500 MG tablet Take 1,000 mg by mouth every 6 (six) hours as needed. For pain      . amLODipine (NORVASC) 10 MG tablet Take 1 tablet (10 mg total) by mouth daily.  90 tablet  3  . aspirin 81 MG tablet Take 81 mg by mouth daily.      Marland Kitchen atorvastatin (LIPITOR) 10 MG tablet Take 1 tablet (10 mg total) by mouth daily at 6 PM.  90 tablet  3  . clopidogrel (PLAVIX) 75 MG tablet Take 1 tablet (75 mg total) by mouth daily.  90 tablet  3  . Krill Oil 500 MG CAPS Take 1 capsule by mouth daily.      Marland Kitchen lisinopril-hydrochlorothiazide (ZESTORETIC) 20-12.5 MG per tablet Take 1 tablet by mouth 2 (two) times daily.  180 tablet  3   No facility-administered encounter medications on file as of 03/23/2013.     Review of Systems  Constitutional: Negative.   HENT: Negative.   Eyes: Negative.   Respiratory: Negative.   Cardiovascular: Negative.   Gastrointestinal: Negative.   Endocrine: Negative.   Musculoskeletal: Negative.   Skin: Negative.   Allergic/Immunologic: Negative.   Neurological: Negative.   Hematological: Negative.   Psychiatric/Behavioral: Negative.   All other systems reviewed and are negative.    BP 160/80  Pulse 63  Ht 5\' 4"  (1.626 m)  Wt 191 lb 8 oz (86.864 kg)  BMI 32.85 kg/m2  Physical Exam  Nursing note and vitals reviewed. Constitutional: He is oriented to person, place, and time. He appears well-developed and well-nourished.  HENT:  Head: Normocephalic.  Nose:  Nose normal.  Mouth/Throat: Oropharynx is clear and moist.  Eyes: Conjunctivae are normal. Pupils are equal, round, and reactive to light.  Neck: Normal range of motion. Neck supple. No JVD present. Carotid bruit is present.  Well healed surgical incision on the left, s/p CEA  Cardiovascular: Normal rate, regular rhythm, S1 normal, S2 normal, normal heart sounds and intact distal pulses.  Exam reveals no gallop and no friction rub.   No murmur heard. Pulmonary/Chest: Effort normal and breath sounds normal.  No respiratory distress. He has no wheezes. He has no rales. He exhibits no tenderness.  Abdominal: Soft. Bowel sounds are normal. He exhibits no distension. There is no tenderness.  Musculoskeletal: Normal range of motion. He exhibits no edema and no tenderness.  Lymphadenopathy:    He has no cervical adenopathy.  Neurological: He is alert and oriented to person, place, and time. Coordination normal.  Skin: Skin is warm and dry. No rash noted. No erythema.  Psychiatric: He has a normal mood and affect. His behavior is normal. Judgment and thought content normal.      Assessment and Plan

## 2013-03-23 NOTE — Assessment & Plan Note (Signed)
Encouraged him to continue to monitor his blood pressure closely and call our office for blood pressures consistently in the 140-150 range

## 2013-06-15 ENCOUNTER — Other Ambulatory Visit: Payer: Self-pay | Admitting: Family Medicine

## 2013-06-28 IMAGING — CT CT ANGIOGRAPHY NECK
1 series · 12 of 14 positions shown · IV contrast (APPLIED)
Comparison: none

REASON FOR EXAM: Aortic arch to carotid arteries    carotid stenosis
COMMENTS:

PROCEDURE:     KCT - KCT ANGIOGRAPHY NECK W/WO  - June 15, 2012  [DATE]
RESULT:     History: Carotid disease.
Comparison Study: No prior.

[Series 4: 3 soft tissue · axial · 0.42mm/px · z∈[-304,-40]mm · 12 of 106 slices shown]
[im 9/106  soft-tissue]
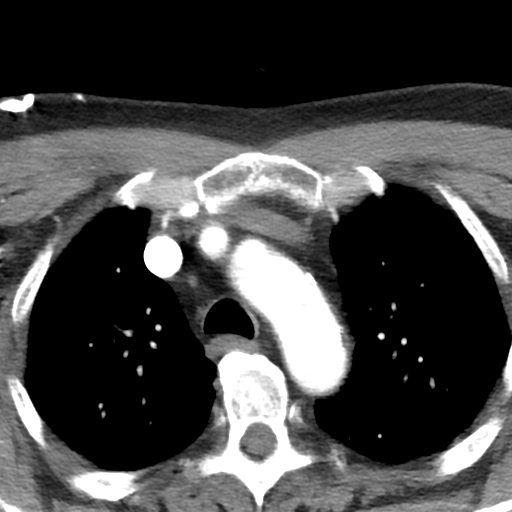
[im 17/106  bone]
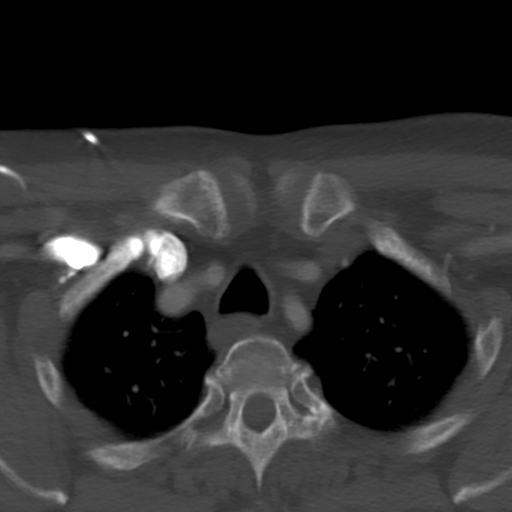
[im 25/106  soft-tissue]
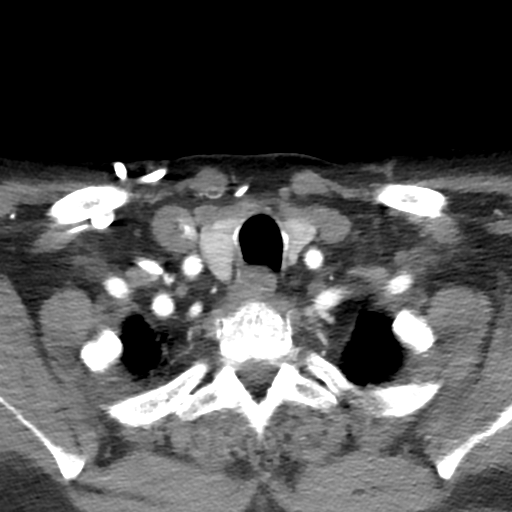
[im 33/106  bone]
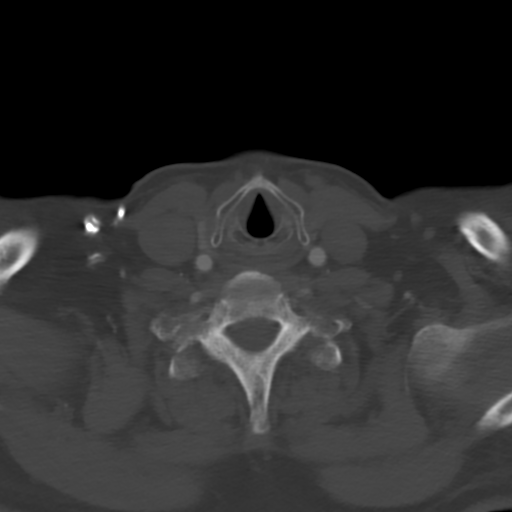
[im 41/106  soft-tissue]
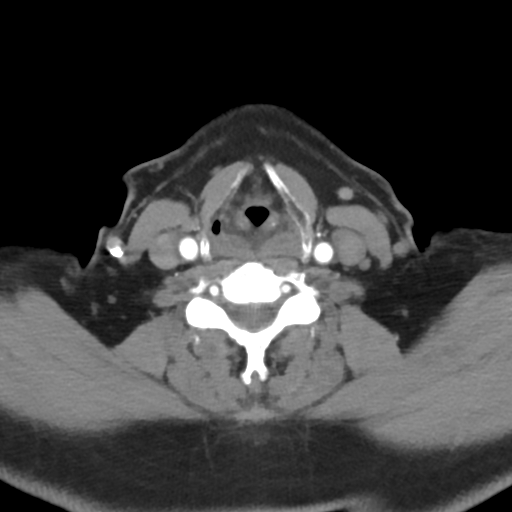
[im 49/106  bone]
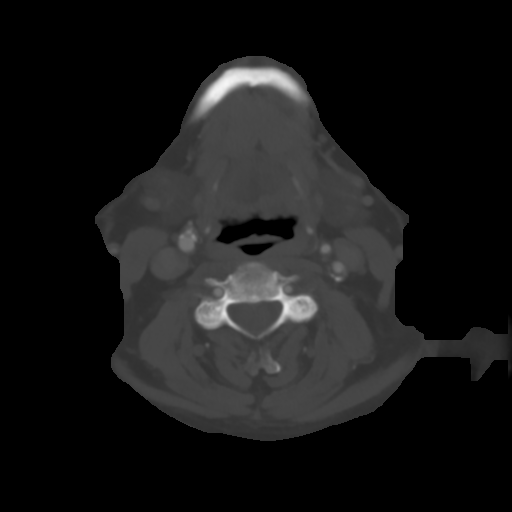
[im 57/106  soft-tissue]
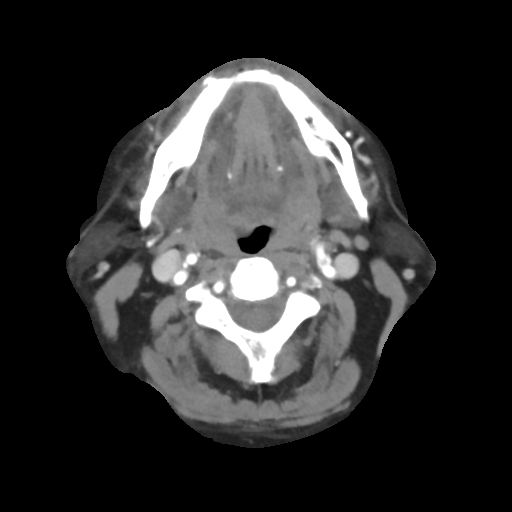
[im 65/106  bone]
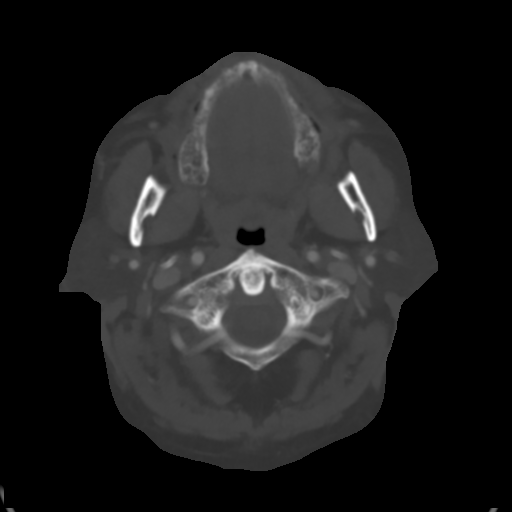
[im 73/106  soft-tissue]
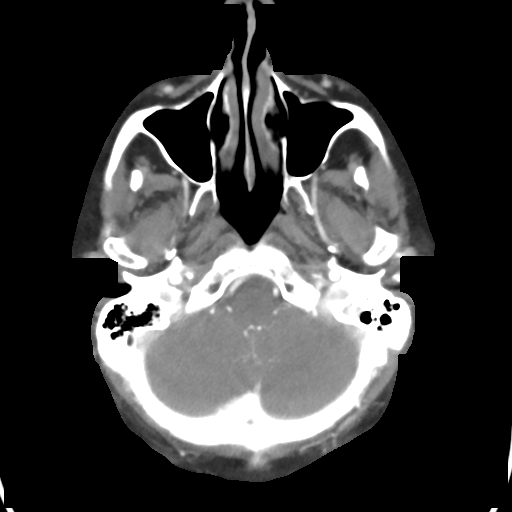
[im 81/106  bone]
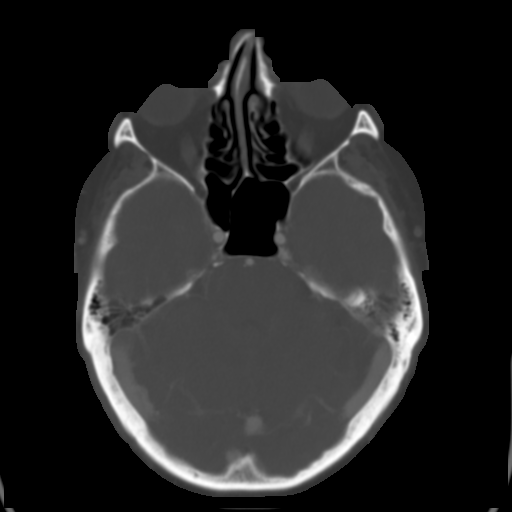
[im 89/106  soft-tissue]
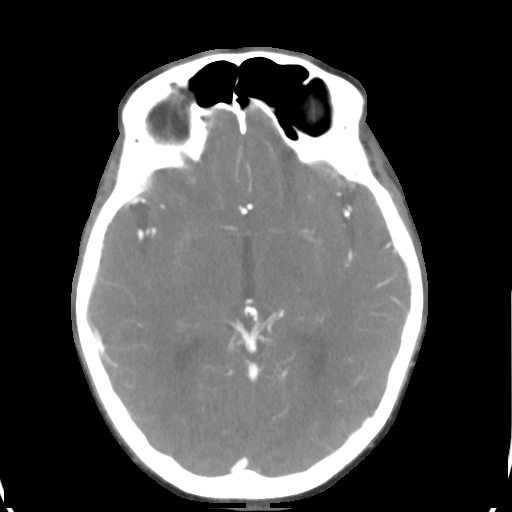
[im 97/106  bone]
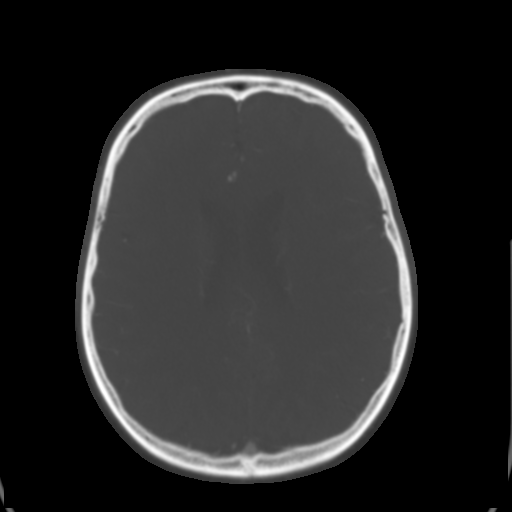

[12 of 14 positions shown; findings below may reference images not displayed]

FINDINGS: Standard CTA obtained with axial source images, MIP images, and
volume rendered images reviewed. Evaluation with Vessel View performed on
separate workstation. 100 cc of [id number]-69G administered. Standard great
vessel anatomy noted. Approximately 30% calcific stenosis noted right
carotid bifurcation. Greater than 90% densely calcified stenosis present at
the left carotid bifurcation. Degree of stenosis is over approximately 11 mm
segment. There is approximate 50% calcific stenosis just distal to the
bifurcation. Intracranially anterior and posterior circulation widely
patent. Vertebrals are patent. Origin stenoses of both vertebrals cannot be
excluded.
IMPRESSION: Near occlusive stenosis left carotid bifurcation as
described above.

## 2013-08-28 ENCOUNTER — Other Ambulatory Visit: Payer: Self-pay | Admitting: Surgery

## 2013-08-28 DIAGNOSIS — I6529 Occlusion and stenosis of unspecified carotid artery: Secondary | ICD-10-CM

## 2013-09-17 DIAGNOSIS — Z85828 Personal history of other malignant neoplasm of skin: Secondary | ICD-10-CM | POA: Diagnosis not present

## 2013-09-17 DIAGNOSIS — D485 Neoplasm of uncertain behavior of skin: Secondary | ICD-10-CM | POA: Diagnosis not present

## 2013-09-17 DIAGNOSIS — L570 Actinic keratosis: Secondary | ICD-10-CM | POA: Diagnosis not present

## 2013-09-17 DIAGNOSIS — D239 Other benign neoplasm of skin, unspecified: Secondary | ICD-10-CM | POA: Diagnosis not present

## 2013-09-30 ENCOUNTER — Other Ambulatory Visit: Payer: Self-pay | Admitting: Cardiovascular Disease

## 2013-10-06 ENCOUNTER — Other Ambulatory Visit: Payer: Self-pay | Admitting: Family Medicine

## 2013-10-11 ENCOUNTER — Other Ambulatory Visit: Payer: Self-pay

## 2013-10-11 MED ORDER — LISINOPRIL-HYDROCHLOROTHIAZIDE 20-12.5 MG PO TABS: 1.0000 | ORAL_TABLET | Freq: Two times a day (BID) | ORAL | Status: DC

## 2013-10-11 NOTE — Telephone Encounter (Signed)
Pt request refill lisinopril 10 mg, pt is out of med; spoke with Lattie Haw at [facility] pt last got Lisinopril 10 mg on 07/15/2012 and on 07/01/13 Lisinopril HCTZ 20-12.5 was refilled. Pt and his wife is not sure which med pt is taking. Dr Darnell Level said to refill Lisinopril HCTZ for 30 days and pt should schedule appt with Dr Rockey Situ before med is out. Lisinopril HCTZ # 60 sent to [facility] and pt will have wife call today for Dr Rockey Situ appt. Pt voiced understanding.

## 2013-10-15 DIAGNOSIS — L570 Actinic keratosis: Secondary | ICD-10-CM | POA: Diagnosis not present

## 2013-10-19 ENCOUNTER — Ambulatory Visit (INDEPENDENT_AMBULATORY_CARE_PROVIDER_SITE_OTHER): Payer: Medicare Other | Admitting: Cardiovascular Disease

## 2013-10-19 ENCOUNTER — Encounter: Payer: Self-pay | Admitting: Cardiovascular Disease

## 2013-10-19 VITALS — BP 140/62 | HR 63 | Ht 63.5 in | Wt 193.5 lb

## 2013-10-19 DIAGNOSIS — I10 Essential (primary) hypertension: Secondary | ICD-10-CM | POA: Diagnosis not present

## 2013-10-19 DIAGNOSIS — I6529 Occlusion and stenosis of unspecified carotid artery: Secondary | ICD-10-CM | POA: Diagnosis not present

## 2013-10-19 DIAGNOSIS — E785 Hyperlipidemia, unspecified: Secondary | ICD-10-CM | POA: Diagnosis not present

## 2013-10-19 DIAGNOSIS — R5381 Other malaise: Secondary | ICD-10-CM

## 2013-10-19 DIAGNOSIS — R5383 Other fatigue: Secondary | ICD-10-CM | POA: Diagnosis not present

## 2013-10-19 DIAGNOSIS — I6523 Occlusion and stenosis of bilateral carotid arteries: Secondary | ICD-10-CM

## 2013-10-19 DIAGNOSIS — I658 Occlusion and stenosis of other precerebral arteries: Secondary | ICD-10-CM

## 2013-10-19 MED ORDER — LISINOPRIL-HYDROCHLOROTHIAZIDE 20-12.5 MG PO TABS: 1.0000 | ORAL_TABLET | Freq: Two times a day (BID) | ORAL | Status: DC

## 2013-10-19 NOTE — Patient Instructions (Signed)
You are doing well. No medication changes were made.  Please call us if you have new issues that need to be addressed before your next appt.  Your physician wants you to follow-up in: 12 months.  You will receive a reminder letter in the mail two months in advance. If you don't receive a letter, please call our office to schedule the follow-up appointment. 

## 2013-10-19 NOTE — Progress Notes (Signed)
Patient ID: Craig Avery, male    DOB: 07-Oct-1940, 73 y.o.   MRN: [id number]  HPI Comments: 73 year old gentleman with history of hypertension and peripheral vascular disease, smoking history , recent hospitalization x2 at [facility], L sided paresthesias, Found to have stroke (acute right PCA distribution ischemic infarction involving the right occipital region and right thalamus along with severe R PCA), returned a few days later to ER hypertensive to >756 systolic,  With medication management for blood pressure   in followup today, he reports that he is retiring in 15 days.   Found to have severe left carotid stenosis, estimated at greater than 70% of the proximal left internal carotid artery.  He has refused open carotid endarterectomy in the past but reconsidered late 2013 and had left CEA in [place].  In followup today, he reports that he is been doing well. He reports that his blood pressure is well controlled . Overall he feels well with no complaints.  taking lisinopril HCTZ  2 tabs per day  Prior ultrasound showedCarotid stenosis - R 40-59%, L >80% TEE - vigorous systolic fxn with EF 43-32%, no vegetations/thrombus. Total cholesterol 140, LDL 72  Initial MRI/MRA: 1. Small acute infarcts in the right hippocampus and right occipital lobe, implicating right PCA territory ischemia. No mass effect or hemorrhage.  2. Chronic small vessel ischemia in the left basal ganglia and possibly also the medial left thalamus.   IMPRESSION: 1. High-grade stenosis or focal near occlusion of the right PCA distal P2 segment with attenuated more distal right PCA flow.  2. Moderate to severe focal stenosis of the left PCA P2 segment.  3. 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  4. Mild to moderate anterior circulation atherosclerosis, most pronounced in the MCA M1 segments.  EKG shows normal sinus rhythm with rate 63 beats per minute, no significant ST or T wave  changes     Outpatient Encounter Prescriptions as of 10/19/2013  Medication Sig  . acetaminophen (TYLENOL) 500 MG tablet Take 1,000 mg by mouth every 6 (six) hours as needed. For pain  . amLODipine (NORVASC) 10 MG tablet TAKE 1 TABLET BY MOUTH EVERY DAY  . aspirin 81 MG tablet Take 81 mg by mouth daily.  Marland Kitchen atorvastatin (LIPITOR) 20 MG tablet Take 1 tablet (20 mg total) by mouth daily at 6 PM.  . clopidogrel (PLAVIX) 75 MG tablet Take 1 tablet (75 mg total) by mouth daily.  Marland Kitchen lisinopril-hydrochlorothiazide (ZESTORETIC) 20-12.5 MG per tablet Take 1 tablet by mouth 2 (two) times daily.  . Multiple Vitamin (MULTIVITAMIN) tablet Take 1 tablet by mouth daily.    Review of Systems  Constitutional: Negative.   HENT: Negative.   Eyes: Negative.   Respiratory: Negative.   Cardiovascular: Negative.   Gastrointestinal: Negative.   Endocrine: Negative.   Musculoskeletal: Negative.   Skin: Negative.   Allergic/Immunologic: Negative.   Neurological: Negative.   Hematological: Negative.   Psychiatric/Behavioral: Negative.   All other systems reviewed and are negative.   BP 140/62  Pulse 63  Ht 5' 3.5" (1.613 m)  Wt 193 lb 8 oz (87.771 kg)  BMI 33.74 kg/m2  Physical Exam  Nursing note and vitals reviewed. Constitutional: He is oriented to person, place, and time. He appears well-developed and well-nourished.  HENT:  Head: Normocephalic.  Nose: Nose normal.  Mouth/Throat: Oropharynx is clear and moist.  Eyes: Conjunctivae are normal. Pupils are equal, round, and reactive to light.  Neck: Normal range of motion. Neck supple.  No JVD present. Carotid bruit is present.  Well healed surgical incision on the left, s/p CEA  Cardiovascular: Normal rate, regular rhythm, S1 normal, S2 normal, normal heart sounds and intact distal pulses.  Exam reveals no gallop and no friction rub.   No murmur heard. Pulmonary/Chest: Effort normal and breath sounds normal. No respiratory distress. He has no  wheezes. He has no rales. He exhibits no tenderness.  Abdominal: Soft. Bowel sounds are normal. He exhibits no distension. There is no tenderness.  Musculoskeletal: Normal range of motion. He exhibits no edema and no tenderness.  Lymphadenopathy:    He has no cervical adenopathy.  Neurological: He is alert and oriented to person, place, and time. Coordination normal.  Skin: Skin is warm and dry. No rash noted. No erythema.  Psychiatric: He has a normal mood and affect. His behavior is normal. Judgment and thought content normal.      Assessment and Plan

## 2013-10-19 NOTE — Assessment & Plan Note (Signed)
Rare episodes of fatigue. Encouraged him to start a regular exercise program once he requires

## 2013-10-19 NOTE — Assessment & Plan Note (Signed)
Blood pressure is well controlled on today's visit. No changes made to the medications. 

## 2013-10-19 NOTE — Assessment & Plan Note (Signed)
Repeat carotid ultrasound next year. Last time was July 2014

## 2013-11-05 ENCOUNTER — Other Ambulatory Visit: Payer: Self-pay | Admitting: Family Medicine

## 2013-11-06 ENCOUNTER — Other Ambulatory Visit: Payer: Self-pay | Admitting: Family Medicine

## 2013-11-07 ENCOUNTER — Other Ambulatory Visit: Payer: Self-pay | Admitting: Family Medicine

## 2014-01-21 ENCOUNTER — Other Ambulatory Visit: Payer: Self-pay | Admitting: Family Medicine

## 2014-02-08 ENCOUNTER — Encounter: Payer: Self-pay | Admitting: Family

## 2014-02-11 ENCOUNTER — Ambulatory Visit (HOSPITAL_COMMUNITY)
Admission: RE | Admit: 2014-02-11 | Discharge: 2014-02-11 | Disposition: A | Discharge status: Home / Self Care | Payer: Medicare Other | Source: Ambulatory Visit | Attending: Family | Admitting: Family

## 2014-02-11 ENCOUNTER — Ambulatory Visit (INDEPENDENT_AMBULATORY_CARE_PROVIDER_SITE_OTHER): Payer: Medicare Other | Admitting: Family

## 2014-02-11 ENCOUNTER — Encounter: Payer: Self-pay | Admitting: Family

## 2014-02-11 VITALS — BP 158/69 | HR 51 | Resp 14 | Ht 63.0 in | Wt 198.0 lb

## 2014-02-11 DIAGNOSIS — Z48812 Encounter for surgical aftercare following surgery on the circulatory system: Secondary | ICD-10-CM

## 2014-02-11 DIAGNOSIS — R252 Cramp and spasm: Secondary | ICD-10-CM | POA: Diagnosis not present

## 2014-02-11 DIAGNOSIS — I6529 Occlusion and stenosis of unspecified carotid artery: Secondary | ICD-10-CM

## 2014-02-11 NOTE — Patient Instructions (Signed)
Stroke Prevention Some medical conditions and behaviors are associated with an increased chance of having a stroke. You may prevent a stroke by making healthy choices and managing medical conditions. HOW CAN I REDUCE MY RISK OF HAVING A STROKE?   Stay physically active. Get at least 30 minutes of activity on most or all days.  Do not smoke. It may also be helpful to avoid exposure to secondhand smoke.  Limit alcohol use. Moderate alcohol use is considered to be:  No more than 2 drinks per day for men.  No more than 1 drink per day for nonpregnant women.  Eat healthy foods. This involves:  Eating 5 or more servings of fruits and vegetables a day.  Making dietary changes that address high blood pressure (hypertension), high cholesterol, diabetes, or obesity.  Manage your cholesterol levels.  Making food choices that are high in fiber and low in saturated fat, trans fat, and cholesterol may control cholesterol levels.  Take any prescribed medicines to control cholesterol as directed by your health care provider.  Manage your diabetes.  Controlling your carbohydrate and sugar intake is recommended to manage diabetes.  Take any prescribed medicines to control diabetes as directed by your health care provider.  Control your hypertension.  Making food choices that are low in salt (sodium), saturated fat, trans fat, and cholesterol is recommended to manage hypertension.  Take any prescribed medicines to control hypertension as directed by your health care provider.  Maintain a healthy weight.  Reducing calorie intake and making food choices that are low in sodium, saturated fat, trans fat, and cholesterol are recommended to manage weight.  Stop drug abuse.  Avoid taking birth control pills.  Talk to your health care provider about the risks of taking birth control pills if you are over 35 years old, smoke, get migraines, or have ever had a blood clot.  Get evaluated for sleep  disorders (sleep apnea).  Talk to your health care provider about getting a sleep evaluation if you snore a lot or have excessive sleepiness.  Take medicines only as directed by your health care provider.  For some people, aspirin or blood thinners (anticoagulants) are helpful in reducing the risk of forming abnormal blood clots that can lead to stroke. If you have the irregular heart rhythm of atrial fibrillation, you should be on a blood thinner unless there is a good reason you cannot take them.  Understand all your medicine instructions.  Make sure that other conditions (such as anemia or atherosclerosis) are addressed. SEEK IMMEDIATE MEDICAL CARE IF:   You have sudden weakness or numbness of the face, arm, or leg, especially on one side of the body.  Your face or eyelid droops to one side.  You have sudden confusion.  You have trouble speaking (aphasia) or understanding.  You have sudden trouble seeing in one or both eyes.  You have sudden trouble walking.  You have dizziness.  You have a loss of balance or coordination.  You have a sudden, severe headache with no known cause.  You have new chest pain or an irregular heartbeat. Any of these symptoms may represent a serious problem that is an emergency. Do not wait to see if the symptoms will go away. Get medical help at once. Call your local emergency services (911 in [place] Do not drive yourself to the hospital. Document Released: 08/05/2004 Document Revised: 11/12/2013 Document Reviewed: 12/29/2012 Exit[employer] Patient Information 2015 [employer]. This information is not intended to replace advice given   to you by your health care provider. Make sure you discuss any questions you have with your health care provider.  

## 2014-02-11 NOTE — Progress Notes (Signed)
Established Carotid Patient   History of Present Illness  Craig Avery is a 73 y.o. male patient of Dr. Trula Slade who is status post left carotid endarterectomy on 07/21/2012 for asymptomatic stenosis. Intraoperative findings included 90% stenosis. He is doing well without complaints today. He denies any current neurologic symptoms.  The patient has a history of right brain stroke as manifested by tingling on the left side of his body and severe hypertension in 2014, just before the left CEA. He has not had any symptoms since we started him on maximal medical therapy. He has no significant right carotid stenosis. He continues to be a nonsmoker. He is medically managed with dual antiplatelet therapy. He is on a statin for hypercholesterolemia. He returns today for follow up. He denies any history of amaurosis fugax or aphasia. His right hip joint hurts if he does much walking, denies non healing wounds. He states his grandmother did not smoke, did not have DM, and had both legs amputated due to poor circulation.   Pt denies New Medical or Surgical History.  Pt Diabetic: No Pt smoker: former smoker, quit in 1995  Pt meds include: Statin : Yes ASA: Yes Other anticoagulants/antiplatelets: Plavix   Past Medical History  Diagnosis Date  . Stroke 05/2012    R PCA territory, presented with L paresthesias  . HTN (hypertension)   . HLD (hyperlipidemia)   . History of chicken pox   . Obesity   . 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 Carotid stenosis 01/2013    bilateral, s/p L CEA, plan rpt [place] qyear    Social History History  Substance Use Topics  . Smoking status: Former Smoker -- 1.50 packs/day for 33 years    Types: Cigarettes    Quit date: 06/18/1994  . Smokeless tobacco: Never Used  . Alcohol Use: 6.0 - 7.2 oz/week    10-12 Cans of beer per week     Comment: Consumes 5 beers per week    Family  History Family History  Problem Relation Age of Onset  . CAD Brother 58    MI  . Heart disease Brother     before age 75  . Heart attack Brother   . Hypertension Mother   . Cirrhosis Father 42    EtOHic  . Aneurysm Paternal Uncle 83  . Heart disease Daughter   . Hyperlipidemia Daughter     Surgical History Past Surgical History  Procedure Laterality Date  . Tee without cardioversion  05/22/2012    Procedure: TRANSESOPHAGEAL ECHOCARDIOGRAM (TEE);  Surgeon: [name], MD;  Location: [facility] ENDOSCOPY;  Service: Cardiovascular;  Laterality: N/A;  . Cataract extraction  2004    bilateral  . Wisdom tooth extraction    . Endarterectomy  07/21/2012    Left CEA; Surgeon: [name], MD  . Patch angioplasty  07/21/2012    Procedure: PATCH ANGIOPLASTY;  Surgeon: [name], MD;  Location: [facility] OR;  Service: Vascular;  Laterality: Left;  Using 1 cm x 6 cm Vascu Guard patch   . Carotid endarterectomy Left 07-21-12    cea    No Known Allergies  Current Outpatient Prescriptions  Medication Sig Dispense Refill  . acetaminophen (TYLENOL) 500 MG tablet Take 1,000 mg by mouth every 6 (six) hours as needed. For pain      . amLODipine (NORVASC) 10 MG tablet TAKE 1 TABLET BY MOUTH EVERY DAY  90 tablet  1  . aspirin 81 MG tablet Take 81 mg by mouth daily.      Marland Kitchen atorvastatin (LIPITOR) 20 MG tablet Take 1 tablet (20 mg total) by mouth daily at 6 PM.  90 tablet  3  . clopidogrel (PLAVIX) 75 MG tablet Take one tablet daily **MUST HAVE PHYSICAL PRIOR TO FURTHER REFILLS**  30 tablet  0  . Cyanocobalamin (VITAMIN B 12 PO) Take by mouth daily.      Marland Kitchen lisinopril-hydrochlorothiazide (ZESTORETIC) 20-12.5 MG per tablet Take 1 tablet by mouth 2 (two) times daily.  180 tablet  3  . Multiple Vitamin (MULTIVITAMIN) tablet Take 1 tablet by mouth daily.       No current facility-administered medications for this visit.    Review of Systems : See HPI for pertinent positives and negatives.  Physical  Examination  Filed Vitals:   02/11/14 1129 02/11/14 1137  BP: 164/66 158/69  Pulse: 50 51  Resp:  14  Height:  5\' 3"  (1.6 m)  Weight:  198 lb (89.812 kg)  SpO2:  99%   Body mass index is 35.08 kg/(m^2).  General: WDWN obese male in NAD GAIT: normal Eyes: PERRLA Pulmonary:  Non-labored, CTAB, Negative  Rales, Negative rhonchi, & Negative wheezing.  Cardiac: regular Rhythm ,  Negative detected murmur.  VASCULAR EXAM Carotid Bruits Right Left   Negative Negative   Radial pulses are 3+ palpable and equal.                                                                                                                            LE Pulses Right Left       POPLITEAL  not palpable   not palpable       POSTERIOR TIBIAL  1+ palpable   not palpable        DORSALIS PEDIS      ANTERIOR TIBIAL not palpable  not palpable     Gastrointestinal: soft, nontender, BS WNL, no r/g,  negative masses.  Musculoskeletal: Negative muscle atrophy/wasting. M/S 5/5 throughout, Extremities without ischemic changes. Feet appear well perfused, no wounds, no edema in LE's.   Neurologic: A&O X 3; Appropriate Affect ; SENSATION ;normal;  Speech is normal CN 2-12 intact, Pain and light touch intact in extremities, Motor exam as listed above.   Non-Invasive Vascular Imaging CAROTID DUPLEX 02/11/2014   CEREBROVASCULAR DUPLEX EVALUATION    INDICATION: Carotid endarterectomy     PREVIOUS INTERVENTION(S): Left carotid endarterectomy on 02/05/13    DUPLEX EXAM:     RIGHT  LEFT  Peak Systolic Velocities (cm/s) End Diastolic Velocities (cm/s) Plaque LOCATION Peak Systolic Velocities (cm/s) End Diastolic Velocities (cm/s) Plaque  89 13  CCA PROXIMAL 112 19   90 15  CCA MID 97 20   66 14 HT CCA DISTAL 82 17   445 34 HT ECA 144 16   66 17 HT ICA PROXIMAL 112 29   112 22  ICA  MID 95 24   83 18  ICA DISTAL 92 25     1.7 ICA / CCA Ratio (PSV) Not Calculated  Antegrade Vertebral Flow Antegrade  314  Brachial Systolic Pressure (mmHg) 970  Multiphasic (subclavian artery) Brachial Artery Waveforms Multiphasic (subclavian artery)    Plaque Morphology:  HM = Homogeneous, HT = Heterogeneous, CP = Calcific Plaque, SP = Smooth Plaque, IP = Irregular Plaque     ADDITIONAL FINDINGS:   No significant stenosis of the left external or bilateral common carotid arteries.   Right external carotid artery stenosis noted.    IMPRESSION: 1. Patent left carotid endarterectomy site with no left internal carotid artery stenosis. 2. Doppler velocities suggest a less than 40% stenosis of the right proximal internal carotid artery.    Compared to the previous exam:  No significant change noted when compared to the previous exam on 02/05/13.      Assessment: CONNERY SHIFFLER is a 73 y.o. male who is status post left carotid endarterectomy on 07/21/2012 for asymptomatic stenosis. Intraoperative findings included 90% stenosis. He is doing well without complaints today. He denies any current neurologic symptoms.  The patient has a history of right brain stroke as manifested by tingling on the left side of his body and severe hypertension in 2014, just before the left CEA. He has not had any symptoms since we started him on maximal medical therapy. Carotid Duplex today demonstrates a patent left carotid endarterectomy site with no left internal carotid artery stenosis. Doppler velocities suggest a less than 40% stenosis of the right proximal internal carotid artery. No significant change noted when compared to the previous exam on 02/05/13.  He states his grandmother did not smoke, did not have DM, and had both legs amputated due to poor circulation. He is not diabetic and is a former smoker, his feet appear well perfused but pedal pulses are not palpable except for 1+ right posterior tibial. Will check ABI's on his return in a year for carotid Duplex.   Plan: Follow-up in 1 year with Carotid Duplex scan and  ABI's.   I discussed in depth with the patient the nature of atherosclerosis, and emphasized the importance of maximal medical management including strict control of blood pressure, blood glucose, and lipid levels, obtaining regular exercise, and continued cessation of smoking.  The patient is aware that without maximal medical management the underlying atherosclerotic disease process will progress, limiting the benefit of any interventions. The patient was given information about stroke prevention and what symptoms should prompt the patient to seek immediate medical care. Thank you for allowing [place] to participate in this patient's care.  Clemon Chambers, RN, MSN, FNP-C [facility] Office: [facility] Physician: Trula Slade  02/11/2014 11:51 AM

## 2014-02-19 ENCOUNTER — Other Ambulatory Visit: Payer: Self-pay | Admitting: Family Medicine

## 2014-03-14 ENCOUNTER — Other Ambulatory Visit: Payer: Self-pay | Admitting: Family Medicine

## 2014-03-14 DIAGNOSIS — D649 Anemia, unspecified: Secondary | ICD-10-CM

## 2014-03-14 DIAGNOSIS — I10 Essential (primary) hypertension: Secondary | ICD-10-CM

## 2014-03-14 DIAGNOSIS — E785 Hyperlipidemia, unspecified: Secondary | ICD-10-CM

## 2014-03-15 ENCOUNTER — Other Ambulatory Visit (INDEPENDENT_AMBULATORY_CARE_PROVIDER_SITE_OTHER): Payer: Medicare Other

## 2014-03-15 DIAGNOSIS — D649 Anemia, unspecified: Secondary | ICD-10-CM | POA: Diagnosis not present

## 2014-03-15 DIAGNOSIS — E785 Hyperlipidemia, unspecified: Secondary | ICD-10-CM

## 2014-03-15 DIAGNOSIS — I10 Essential (primary) hypertension: Secondary | ICD-10-CM

## 2014-03-15 LAB — CBC WITH DIFFERENTIAL/PLATELET
BASOS ABS: 0 10*3/uL (ref 0.0–0.1)
Basophils Relative: 0.4 % (ref 0.0–3.0)
EOS PCT: 1.8 % (ref 0.0–5.0)
Eosinophils Absolute: 0.2 10*3/uL (ref 0.0–0.7)
HEMATOCRIT: 34.4 % — AB (ref 39.0–52.0)
Hemoglobin: 11.6 g/dL — ABNORMAL LOW (ref 13.0–17.0)
LYMPHS ABS: 4.8 10*3/uL — AB (ref 0.7–4.0)
Lymphocytes Relative: 49.8 % — ABNORMAL HIGH (ref 12.0–46.0)
MCHC: 33.7 g/dL (ref 30.0–36.0)
MCV: 92.4 fl (ref 78.0–100.0)
MONO ABS: 0.5 10*3/uL (ref 0.1–1.0)
MONOS PCT: 5.7 % (ref 3.0–12.0)
NEUTROS PCT: 42.3 % — AB (ref 43.0–77.0)
Neutro Abs: 4 10*3/uL (ref 1.4–7.7)
Platelets: 293 10*3/uL (ref 150.0–400.0)
RBC: 3.72 Mil/uL — ABNORMAL LOW (ref 4.22–5.81)
RDW: 13.3 % (ref 11.5–15.5)
WBC: 9.6 10*3/uL (ref 4.0–10.5)

## 2014-03-15 LAB — LIPID PANEL
Cholesterol: 118 mg/dL (ref 0–200)
HDL: 41.2 mg/dL (ref >39.00)
LDL Cholesterol: 63 mg/dL (ref 0–99)
NonHDL: 76.8
TRIGLYCERIDES: 69 mg/dL (ref 0.0–149.0)
Total CHOL/HDL Ratio: 3
VLDL: 13.8 mg/dL (ref 0.0–40.0)

## 2014-03-15 LAB — RENAL FUNCTION PANEL
Albumin: 3.9 g/dL (ref 3.5–5.2)
BUN: 27 mg/dL — AB (ref 6–23)
CO2: 29 mEq/L (ref 19–32)
Calcium: 9.3 mg/dL (ref 8.4–10.5)
Chloride: 105 mEq/L (ref 96–112)
Creatinine, Ser: 1.5 mg/dL (ref 0.4–1.5)
GFR: 47.41 mL/min — AB (ref >60.00)
GLUCOSE: 102 mg/dL — AB (ref 70–99)
Phosphorus: 3.4 mg/dL (ref 2.3–4.6)
Potassium: 4.1 mEq/L (ref 3.5–5.1)
SODIUM: 139 meq/L (ref 135–145)

## 2014-03-19 ENCOUNTER — Other Ambulatory Visit: Payer: Self-pay | Admitting: Cardiovascular Disease

## 2014-03-20 ENCOUNTER — Other Ambulatory Visit: Payer: Medicare Other

## 2014-03-21 ENCOUNTER — Other Ambulatory Visit: Payer: Self-pay | Admitting: Family Medicine

## 2014-03-25 ENCOUNTER — Encounter: Payer: Self-pay | Admitting: Family Medicine

## 2014-03-25 ENCOUNTER — Ambulatory Visit (INDEPENDENT_AMBULATORY_CARE_PROVIDER_SITE_OTHER): Payer: Medicare Other | Admitting: Family Medicine

## 2014-03-25 VITALS — BP 134/68 | HR 60 | Temp 98.1°F | Ht 63.0 in | Wt 189.5 lb

## 2014-03-25 DIAGNOSIS — N289 Disorder of kidney and ureter, unspecified: Secondary | ICD-10-CM

## 2014-03-25 DIAGNOSIS — Z7189 Other specified counseling: Secondary | ICD-10-CM | POA: Insufficient documentation

## 2014-03-25 DIAGNOSIS — E785 Hyperlipidemia, unspecified: Secondary | ICD-10-CM

## 2014-03-25 DIAGNOSIS — I658 Occlusion and stenosis of other precerebral arteries: Secondary | ICD-10-CM

## 2014-03-25 DIAGNOSIS — I6529 Occlusion and stenosis of unspecified carotid artery: Secondary | ICD-10-CM

## 2014-03-25 DIAGNOSIS — I6523 Occlusion and stenosis of bilateral carotid arteries: Secondary | ICD-10-CM

## 2014-03-25 DIAGNOSIS — N183 Chronic kidney disease, stage 3 unspecified: Secondary | ICD-10-CM | POA: Insufficient documentation

## 2014-03-25 DIAGNOSIS — I10 Essential (primary) hypertension: Secondary | ICD-10-CM

## 2014-03-25 DIAGNOSIS — Z0000 Encounter for general adult medical examination without abnormal findings: Secondary | ICD-10-CM | POA: Diagnosis not present

## 2014-03-25 DIAGNOSIS — Z8673 Personal history of transient ischemic attack (TIA), and cerebral infarction without residual deficits: Secondary | ICD-10-CM

## 2014-03-25 DIAGNOSIS — Z1211 Encounter for screening for malignant neoplasm of colon: Secondary | ICD-10-CM | POA: Diagnosis not present

## 2014-03-25 MED ORDER — AMLODIPINE BESYLATE 10 MG PO TABS: ORAL_TABLET | ORAL | Status: DC

## 2014-03-25 MED ORDER — CLOPIDOGREL BISULFATE 75 MG PO TABS: ORAL_TABLET | ORAL | Status: DC

## 2014-03-25 NOTE — Assessment & Plan Note (Signed)
Stable over last year but deteriorated over last several. Known PAD - ?renovascular disease. Continue meds as up to now, reassess next visit - I asked him to return in 6 mo for f/u visit.

## 2014-03-25 NOTE — Assessment & Plan Note (Signed)
Chronic, stable. Continue plavix and aspirin.

## 2014-03-25 NOTE — Assessment & Plan Note (Signed)
I have personally reviewed the Medicare Annual Wellness questionnaire and have noted 1. The patient's medical and social history 2. Their use of alcohol, tobacco or illicit drugs 3. Their current medications and supplements 4. The patient's functional ability including ADL's, fall risks, home safety risks and hearing or visual impairment. 5. Diet and physical activity 6. Evidence for depression or mood disorders The patients weight, height, BMI have been recorded in the chart.  Hearing and vision has been addressed. I have made referrals, counseling and provided education to the patient based review of the above and I have provided the pt with a written personalized care plan for preventive services. Provider list updated - see scanned questionairre. Advanced directives discussed: would want wife and daughters to be HCPOA.  Reviewed preventative protocols and updated unless pt declined.

## 2014-03-25 NOTE — Assessment & Plan Note (Signed)
Advanced directives - does not want prolonged life support if irreversible damage. Would want wife and daughters to be HCPOA. Requests info today.

## 2014-03-25 NOTE — Progress Notes (Signed)
BP 134/68  Pulse 60  Temp(Src) 98.1 F (36.7 C) (Oral)  Ht 5' 3" (1.6 m)  Wt 189 lb 8 oz (85.957 kg)  BMI 33.58 kg/m2   CC: medicare wellness  Subjective:    Patient ID: Craig Avery, male    DOB: 01/24/1941, 73 y.o.   MRN: [id number]  HPI: Craig Avery is a 73 y.o. male presenting on 03/25/2014 for Annual Exam   Not seen in >1 year.  Passes hearing and vision screens today Denies depression/anhedonia/sadness, no falls in last year.  Preventative: Colon cancer screening - discussed, would like stool kit Prostate cancer screening - discussed, would like to defer screening Scared of needles.  Declines all immunizations.  Advanced directives - does not want prolonged life support if irreversible damage. Would want wife and daughters to be HCPOA. Requests info today.  Caffeine: light coffee in am, working on cutting back Lives with wife, 1 dog.  Daughter next door.  Other children nearby. 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 Diet: not much water, some fruits/vegetables   Relevant past medical, surgical, family and social history reviewed and updated as indicated.  Allergies and medications reviewed and updated. Current Outpatient Prescriptions on File Prior to Visit  Medication Sig  . acetaminophen (TYLENOL) 500 MG tablet Take 1,000 mg by mouth every 6 (six) hours as needed. For pain  . aspirin 81 MG tablet Take 81 mg by mouth daily.  Marland Kitchen atorvastatin (LIPITOR) 20 MG tablet TAKE 1 TABLET (20 MG TOTAL) BY MOUTH DAILY AT 6 PM.  . Cyanocobalamin (VITAMIN B 12 PO) Take by mouth daily.  Marland Kitchen lisinopril-hydrochlorothiazide (ZESTORETIC) 20-12.5 MG per tablet Take 1 tablet by mouth 2 (two) times daily.  . Multiple Vitamin (MULTIVITAMIN) tablet Take 1 tablet by mouth daily.   No current facility-administered medications on file prior to visit.    Review of Systems Per HPI unless  specifically indicated above    Objective:    BP 134/68  Pulse 60  Temp(Src) 98.1 F (36.7 C) (Oral)  Ht 5' 3" (1.6 m)  Wt 189 lb 8 oz (85.957 kg)  BMI 33.58 kg/m2  Physical Exam  Nursing note and vitals reviewed. Constitutional: He is oriented to person, place, and time. He appears well-developed and well-nourished. No distress.  HENT:  Head: Normocephalic and atraumatic.  Right Ear: Hearing, tympanic membrane, external ear and ear canal normal.  Left Ear: Hearing, tympanic membrane, external ear and ear canal normal.  Nose: Nose normal.  Mouth/Throat: Uvula is midline, oropharynx is clear and moist and mucous membranes are normal. No oropharyngeal exudate, posterior oropharyngeal edema or posterior oropharyngeal erythema.  Eyes: Conjunctivae and EOM are normal. Pupils are equal, round, and reactive to light. No scleral icterus.  Neck: Normal range of motion. Neck supple. Carotid bruit is present (R). No thyromegaly present.  Cardiovascular: Normal rate, regular rhythm, normal heart sounds and intact distal pulses.   No murmur heard. Pulses:      Radial pulses are 2+ on the right side, and 2+ on the left side.  Pulmonary/Chest: Effort normal and breath sounds normal. No respiratory distress. He has no wheezes. He has no rales.  Abdominal: Soft. Bowel sounds are normal. He exhibits no distension and no mass. There is no tenderness. There is no rebound and no guarding.  Musculoskeletal: Normal range of motion. He exhibits no edema.  Lymphadenopathy:    He has no cervical adenopathy.  Neurological: He is alert and oriented to person, place, and time.  CN grossly intact, station and gait intact Recall 2/3, 3/3 with cue Calculation 4/5 serial 3s  Skin: Skin is warm and dry. No rash noted.  Psychiatric: He has a normal mood and affect. His behavior is normal. Judgment and thought content normal.   Results for orders placed in visit on 03/15/14  RENAL FUNCTION PANEL      Result  Value Ref Range   Sodium 139  135 - 145 mEq/L   Potassium 4.1  3.5 - 5.1 mEq/L   Chloride 105  96 - 112 mEq/L   CO2 29  19 - 32 mEq/L   Calcium 9.3  8.4 - 10.5 mg/dL   Albumin 3.9  3.5 - 5.2 g/dL   BUN 27 (*) 6 - 23 mg/dL   Creatinine, Ser 1.5  0.4 - 1.5 mg/dL   Glucose, Bld 102 (*) 70 - 99 mg/dL   Phosphorus 3.4  2.3 - 4.6 mg/dL   GFR 47.41 (*) >60.00 mL/min  CBC WITH DIFFERENTIAL      Result Value Ref Range   WBC 9.6  4.0 - 10.5 K/uL   RBC 3.72 (*) 4.22 - 5.81 Mil/uL   Hemoglobin 11.6 (*) 13.0 - 17.0 g/dL   HCT 34.4 (*) 39.0 - 52.0 %   MCV 92.4  78.0 - 100.0 fl   MCHC 33.7  30.0 - 36.0 g/dL   RDW 13.3  11.5 - 15.5 %   Platelets 293.0  150.0 - 400.0 K/uL   Neutrophils Relative % 42.3 (*) 43.0 - 77.0 %   Lymphocytes Relative 49.8 (*) 12.0 - 46.0 %   Monocytes Relative 5.7  3.0 - 12.0 %   Eosinophils Relative 1.8  0.0 - 5.0 %   Basophils Relative 0.4  0.0 - 3.0 %   Neutro Abs 4.0  1.4 - 7.7 K/uL   Lymphs Abs 4.8 (*) 0.7 - 4.0 K/uL   Monocytes Absolute 0.5  0.1 - 1.0 K/uL   Eosinophils Absolute 0.2  0.0 - 0.7 K/uL   Basophils Absolute 0.0  0.0 - 0.1 K/uL  LIPID PANEL      Result Value Ref Range   Cholesterol 118  0 - 200 mg/dL   Triglycerides 69.0  0.0 - 149.0 mg/dL   HDL 41.20  >39.00 mg/dL   VLDL 13.8  0.0 - 40.0 mg/dL   LDL Cholesterol 63  0 - 99 mg/dL   Total CHOL/HDL Ratio 3     NonHDL 76.80        Assessment & Plan:   Problem List Items Addressed This Visit   Renal insufficiency     Stable over last year but deteriorated over last several. Known PAD - ?renovascular disease. Continue meds as up to now, reassess next visit - I asked him to return in 6 mo for f/u visit.    Medicare annual wellness visit, initial - Primary     I have personally reviewed the Medicare Annual Wellness questionnaire and have noted 1. The patient's medical and social history 2. Their use of alcohol, tobacco or illicit drugs 3. Their current medications and supplements 4. The  patient's functional ability including ADL's, fall risks, home safety risks and hearing or visual impairment. 5. Diet and physical activity 6. Evidence for depression or mood disorders The patients weight, height, BMI have been recorded in the chart.  Hearing and vision has been addressed. I have made referrals, counseling and provided education to the  patient based review of the above and I have provided the pt with a written personalized care plan for preventive services. Provider list updated - see scanned questionairre. Advanced directives discussed: would want wife and daughters to be HCPOA.  Reviewed preventative protocols and updated unless pt declined.    Hyperlipidemia     Chronic, stable. Continue lipitor.    Relevant Medications      amLODIpine (NORVASC) tablet   HTN (hypertension) (Chronic)     Chronic, stable. Continue regimen.    Relevant Medications      amLODIpine (NORVASC) tablet   History of ischemic right PCA stroke     Chronic, stable. Continue plavix and aspirin.    Carotid stenosis, bilateral     Stable R carotid stenosis. Followed by [facility] - appreciate their care.    Relevant Medications      amLODIpine (NORVASC) tablet   Advanced care planning/counseling discussion     Advanced directives - does not want prolonged life support if irreversible damage. Would want wife and daughters to be HCPOA. Requests info today.     Other Visit Diagnoses   Special screening for malignant neoplasms, colon        Relevant Orders       Fecal occult blood, imunochemical        Follow up plan: Return in about 6 months (around 09/23/2014), or as needed, for follow up.

## 2014-03-25 NOTE — Assessment & Plan Note (Signed)
Chronic, stable. Continue regimen. 

## 2014-03-25 NOTE — Patient Instructions (Signed)
Return in next few months for follow up with Dr. Rockey Situ Stool kit today. Keep thinking about shots. Advanced directive packet provided today. Return in 6 months for labwork and afterwards for office visit.

## 2014-03-25 NOTE — Assessment & Plan Note (Signed)
Stable R carotid stenosis. Followed by [facility] - appreciate their care.

## 2014-03-25 NOTE — Assessment & Plan Note (Signed)
Chronic, stable. Continue lipitor.  

## 2014-03-25 NOTE — Progress Notes (Signed)
Pre visit review using our clinic review tool, if applicable. No additional management support is needed unless otherwise documented below in the visit note. 

## 2014-04-01 ENCOUNTER — Other Ambulatory Visit: Payer: Self-pay | Admitting: Family Medicine

## 2014-04-02 ENCOUNTER — Other Ambulatory Visit: Payer: Self-pay | Admitting: Family Medicine

## 2014-04-10 ENCOUNTER — Other Ambulatory Visit: Payer: Self-pay | Admitting: Family Medicine

## 2014-04-17 ENCOUNTER — Other Ambulatory Visit: Payer: Medicare Other

## 2014-04-17 DIAGNOSIS — Z1211 Encounter for screening for malignant neoplasm of colon: Secondary | ICD-10-CM

## 2014-04-17 LAB — FECAL OCCULT BLOOD, IMMUNOCHEMICAL: FECAL OCCULT BLD: POSITIVE — AB

## 2014-04-19 ENCOUNTER — Other Ambulatory Visit: Payer: Self-pay | Admitting: Family Medicine

## 2014-04-19 DIAGNOSIS — R195 Other fecal abnormalities: Secondary | ICD-10-CM

## 2014-04-30 DIAGNOSIS — D649 Anemia, unspecified: Secondary | ICD-10-CM | POA: Diagnosis not present

## 2014-04-30 DIAGNOSIS — R195 Other fecal abnormalities: Secondary | ICD-10-CM | POA: Diagnosis not present

## 2014-06-10 DIAGNOSIS — H53462 Homonymous bilateral field defects, left side: Secondary | ICD-10-CM | POA: Diagnosis not present

## 2014-08-14 DIAGNOSIS — L814 Other melanin hyperpigmentation: Secondary | ICD-10-CM | POA: Diagnosis not present

## 2014-08-14 DIAGNOSIS — Z85828 Personal history of other malignant neoplasm of skin: Secondary | ICD-10-CM | POA: Diagnosis not present

## 2014-08-14 DIAGNOSIS — L570 Actinic keratosis: Secondary | ICD-10-CM | POA: Diagnosis not present

## 2014-08-14 DIAGNOSIS — L219 Seborrheic dermatitis, unspecified: Secondary | ICD-10-CM | POA: Diagnosis not present

## 2014-10-21 ENCOUNTER — Encounter: Payer: Self-pay | Admitting: Cardiovascular Disease

## 2014-10-21 ENCOUNTER — Ambulatory Visit (INDEPENDENT_AMBULATORY_CARE_PROVIDER_SITE_OTHER): Payer: Medicare Other | Admitting: Cardiovascular Disease

## 2014-10-21 VITALS — BP 142/64 | HR 54 | Ht 63.0 in | Wt 192.2 lb

## 2014-10-21 DIAGNOSIS — I10 Essential (primary) hypertension: Secondary | ICD-10-CM

## 2014-10-21 DIAGNOSIS — I6523 Occlusion and stenosis of bilateral carotid arteries: Secondary | ICD-10-CM

## 2014-10-21 DIAGNOSIS — E785 Hyperlipidemia, unspecified: Secondary | ICD-10-CM | POA: Diagnosis not present

## 2014-10-21 DIAGNOSIS — G459 Transient cerebral ischemic attack, unspecified: Secondary | ICD-10-CM | POA: Diagnosis not present

## 2014-10-21 DIAGNOSIS — R5382 Chronic fatigue, unspecified: Secondary | ICD-10-CM

## 2014-10-21 DIAGNOSIS — E669 Obesity, unspecified: Secondary | ICD-10-CM

## 2014-10-21 NOTE — Assessment & Plan Note (Signed)
I suspect his symptoms of fatigue are from general deconditioning, less likely ischemia Recommended a regular walking program

## 2014-10-21 NOTE — Assessment & Plan Note (Signed)
Monitored in [place]. We'll continue aggressive cholesterol management

## 2014-10-21 NOTE — Patient Instructions (Addendum)
You are doing well. No medication changes were made.  Please call us if you have new issues that need to be addressed before your next appt.  Your physician wants you to follow-up in: 12 months.  You will receive a reminder letter in the mail two months in advance. If you don't receive a letter, please call our office to schedule the follow-up appointment. 

## 2014-10-21 NOTE — Progress Notes (Signed)
Patient ID: Craig Avery, male    DOB: 02-21-1941, 74 y.o.   MRN: [id number]  HPI Comments: 74 year-old gentleman with history of hypertension and peripheral vascular disease, smoking history , recent hospitalization x2 at [facility], L sided paresthesias, Found to have stroke (acute right PCA distribution ischemic infarction involving the right occipital region and right thalamus along with severe R PCA), returned a few days later to ER hypertensive to >102 systolic,  With medication management for blood pressure He presents for follow-up of his PAD, hypertension  In follow-up, he reports that he is doing well. Denies having any significant symptoms. No chest pain. He does have chronic back pain, seems to give out if he walks too far. Denies any TIA symptoms. Blood pressure well controlled at home  EKG on today's visit shows normal sinus rhythm with rate 54 bpm, no significant ST or T-wave changes Not on beta blockers  Other past medical history History of severe left carotid stenosis, estimated at greater than 70% of the proximal left internal carotid artery.  He has refused open carotid endarterectomy in the past but reconsidered late 2013 and had left CEA in [place].  Prior ultrasound showedCarotid stenosis - R 40-59%, L >80% TEE - vigorous systolic fxn with EF 72-53%, no vegetations/thrombus. Total cholesterol 140, LDL 72  Initial MRI/MRA: 1. Small acute infarcts in the right hippocampus and right occipital lobe, implicating right PCA territory ischemia. No mass effect or hemorrhage.  2. Chronic small vessel ischemia in the left basal ganglia and possibly also the medial left thalamus.   IMPRESSION: 1. High-grade stenosis or focal near occlusion of the right PCA distal P2 segment with attenuated more distal right PCA flow.  2. Moderate to severe focal stenosis of the left PCA P2 segment.  3. 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  4. Mild to moderate anterior  circulation atherosclerosis, most pronounced in the MCA M1 segments.   No Known Allergies  Outpatient Encounter Prescriptions as of 10/21/2014  Medication Sig  . acetaminophen (TYLENOL) 500 MG tablet Take 1,000 mg by mouth every 6 (six) hours as needed. For pain  . amLODipine (NORVASC) 10 MG tablet TAKE 1 TABLET BY MOUTH EVERY DAY  . aspirin 81 MG tablet Take 81 mg by mouth daily.  Marland Kitchen atorvastatin (LIPITOR) 20 MG tablet TAKE 1 TABLET (20 MG TOTAL) BY MOUTH DAILY AT 6 PM.  . clopidogrel (PLAVIX) 75 MG tablet TAKE 1 TABLET BY MOUTH ONCE A DAY.  Marland Kitchen Cyanocobalamin (VITAMIN B 12 PO) Take by mouth daily.  Marland Kitchen lisinopril-hydrochlorothiazide (ZESTORETIC) 20-12.5 MG per tablet Take 1 tablet by mouth 2 (two) times daily.  . Multiple Vitamin (MULTIVITAMIN) tablet Take 1 tablet by mouth daily.  . [DISCONTINUED] amLODipine (NORVASC) 10 MG tablet TAKE 1 TABLET BY MOUTH EVERY DAY (Patient not taking: Reported on 10/21/2014)    Past Medical History  Diagnosis Date  . Stroke 05/2012    R PCA territory, presented with L paresthesias  . HTN (hypertension)   . HLD (hyperlipidemia)   . History of chicken pox   . Obesity   . 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 Carotid stenosis 01/2013    bilateral, s/p L CEA, plan rpt [place] qyear    Past Surgical History  Procedure Laterality Date  . Tee without cardioversion  05/22/2012    Procedure: TRANSESOPHAGEAL ECHOCARDIOGRAM (TEE);  Surgeon: [name], MD;  Location: [facility];  Service: Cardiovascular;  Laterality: N/A;  . Cataract extraction  2004    bilateral  . Wisdom tooth extraction    . Endarterectomy  07/21/2012    Left CEA; Surgeon: [name], MD  . Patch angioplasty  07/21/2012    Procedure: PATCH ANGIOPLASTY;  Surgeon: [name], MD;  [facility] OR;  Service: Vascular;  Laterality: Left;  Using 1 cm x 6 cm Vascu Guard patch   . Carotid endarterectomy Left 07-21-12     cea    Social History  reports that he quit smoking about 20 years ago. His smoking use included Cigarettes. He has a 49.5 pack-year smoking history. He has never used smokeless tobacco. He reports that he drinks about 6.0 - 7.2 oz of alcohol per week. He reports that he does not use illicit drugs.  Family History family history includes Aneurysm (age of onset: 37) in his paternal uncle; CAD (age of onset: 29) in his brother; Cirrhosis (age of onset: 18) in his father; Heart attack in his brother; Heart disease in his brother and daughter; Hyperlipidemia in his daughter; Hypertension in his mother.   Review of Systems  Constitutional: Negative.   Respiratory: Negative.   Cardiovascular: Negative.   Gastrointestinal: Negative.   Musculoskeletal: Negative.   Skin: Negative.   Neurological: Negative.   Hematological: Negative.   Psychiatric/Behavioral: Negative.   All other systems reviewed and are negative.   BP 142/64 mmHg  Pulse 54  Ht 5\' 3"  (1.6 m)  Wt 192 lb 4 oz (87.204 kg)  BMI 34.06 kg/m2  Physical Exam  Constitutional: He is oriented to person, place, and time. He appears well-developed and well-nourished.  HENT:  Head: Normocephalic.  Nose: Nose normal.  Mouth/Throat: Oropharynx is clear and moist.  Eyes: Conjunctivae are normal. Pupils are equal, round, and reactive to light.  Neck: Normal range of motion. Neck supple. No JVD present. Carotid bruit is present.  Well healed surgical incision on the left, s/p CEA  Cardiovascular: Normal rate, regular rhythm, S1 normal, S2 normal, normal heart sounds and intact distal pulses.  Exam reveals no gallop and no friction rub.   No murmur heard. Pulmonary/Chest: Effort normal and breath sounds normal. No respiratory distress. He has no wheezes. He has no rales. He exhibits no tenderness.  Abdominal: Soft. Bowel sounds are normal. He exhibits no distension. There is no tenderness.  Musculoskeletal: Normal range of motion.  He exhibits no edema or tenderness.  Lymphadenopathy:    He has no cervical adenopathy.  Neurological: He is alert and oriented to person, place, and time. Coordination normal.  Skin: Skin is warm and dry. No rash noted. No erythema.  Psychiatric: He has a normal mood and affect. His behavior is normal. Judgment and thought content normal.      Assessment and Plan   Nursing note and vitals reviewed.

## 2014-10-21 NOTE — Assessment & Plan Note (Signed)
We have encouraged continued exercise, careful diet management in an effort to lose weight. 

## 2014-10-21 NOTE — Assessment & Plan Note (Signed)
Cholesterol is at goal on the current lipid regimen. No changes to the medications were made.  

## 2014-10-21 NOTE — Assessment & Plan Note (Signed)
Blood pressure is well controlled on today's visit. No changes made to the medications. 

## 2014-10-21 NOTE — Assessment & Plan Note (Signed)
Recommended that he stay on aspirin and Plavix No recent TIA or stroke symptoms

## 2014-10-23 ENCOUNTER — Other Ambulatory Visit: Payer: Self-pay | Admitting: *Deleted

## 2014-10-23 MED ORDER — LISINOPRIL-HYDROCHLOROTHIAZIDE 20-12.5 MG PO TABS: 1.0000 | ORAL_TABLET | Freq: Two times a day (BID) | ORAL | Status: DC

## 2014-11-16 ENCOUNTER — Emergency Department (HOSPITAL_COMMUNITY)
Admission: EM | Admit: 2014-11-16 | Discharge: 2014-11-16 | Disposition: A | Discharge status: Home / Self Care | Payer: Medicare Other | Attending: Emergency Medicine | Admitting: Emergency Medicine

## 2014-11-16 ENCOUNTER — Encounter (HOSPITAL_COMMUNITY): Payer: Self-pay | Admitting: Emergency Medicine

## 2014-11-16 ENCOUNTER — Emergency Department (HOSPITAL_COMMUNITY): Payer: Medicare Other

## 2014-11-16 DIAGNOSIS — M542 Cervicalgia: Secondary | ICD-10-CM | POA: Diagnosis not present

## 2014-11-16 DIAGNOSIS — Z8619 Personal history of other infectious and parasitic diseases: Secondary | ICD-10-CM | POA: Insufficient documentation

## 2014-11-16 DIAGNOSIS — Y999 Unspecified external cause status: Secondary | ICD-10-CM | POA: Insufficient documentation

## 2014-11-16 DIAGNOSIS — E669 Obesity, unspecified: Secondary | ICD-10-CM | POA: Diagnosis not present

## 2014-11-16 DIAGNOSIS — S0121XA Laceration without foreign body of nose, initial encounter: Secondary | ICD-10-CM | POA: Insufficient documentation

## 2014-11-16 DIAGNOSIS — R112 Nausea with vomiting, unspecified: Secondary | ICD-10-CM | POA: Diagnosis not present

## 2014-11-16 DIAGNOSIS — S0993XA Unspecified injury of face, initial encounter: Secondary | ICD-10-CM | POA: Diagnosis not present

## 2014-11-16 DIAGNOSIS — Z79899 Other long term (current) drug therapy: Secondary | ICD-10-CM | POA: Diagnosis not present

## 2014-11-16 DIAGNOSIS — D649 Anemia, unspecified: Secondary | ICD-10-CM | POA: Diagnosis not present

## 2014-11-16 DIAGNOSIS — I129 Hypertensive chronic kidney disease with stage 1 through stage 4 chronic kidney disease, or unspecified chronic kidney disease: Secondary | ICD-10-CM | POA: Diagnosis not present

## 2014-11-16 DIAGNOSIS — Z87891 Personal history of nicotine dependence: Secondary | ICD-10-CM | POA: Diagnosis not present

## 2014-11-16 DIAGNOSIS — W19XXXA Unspecified fall, initial encounter: Secondary | ICD-10-CM

## 2014-11-16 DIAGNOSIS — Z8673 Personal history of transient ischemic attack (TIA), and cerebral infarction without residual deficits: Secondary | ICD-10-CM | POA: Diagnosis not present

## 2014-11-16 DIAGNOSIS — W010XXA Fall on same level from slipping, tripping and stumbling without subsequent striking against object, initial encounter: Secondary | ICD-10-CM | POA: Insufficient documentation

## 2014-11-16 DIAGNOSIS — R55 Syncope and collapse: Secondary | ICD-10-CM

## 2014-11-16 DIAGNOSIS — R197 Diarrhea, unspecified: Secondary | ICD-10-CM | POA: Diagnosis not present

## 2014-11-16 DIAGNOSIS — N189 Chronic kidney disease, unspecified: Secondary | ICD-10-CM | POA: Diagnosis not present

## 2014-11-16 DIAGNOSIS — Y9389 Activity, other specified: Secondary | ICD-10-CM | POA: Insufficient documentation

## 2014-11-16 DIAGNOSIS — Y929 Unspecified place or not applicable: Secondary | ICD-10-CM | POA: Insufficient documentation

## 2014-11-16 DIAGNOSIS — Z8669 Personal history of other diseases of the nervous system and sense organs: Secondary | ICD-10-CM | POA: Insufficient documentation

## 2014-11-16 DIAGNOSIS — Y92009 Unspecified place in unspecified non-institutional (private) residence as the place of occurrence of the external cause: Secondary | ICD-10-CM

## 2014-11-16 DIAGNOSIS — Z23 Encounter for immunization: Secondary | ICD-10-CM | POA: Diagnosis not present

## 2014-11-16 DIAGNOSIS — S0181XA Laceration without foreign body of other part of head, initial encounter: Secondary | ICD-10-CM | POA: Diagnosis not present

## 2014-11-16 DIAGNOSIS — N289 Disorder of kidney and ureter, unspecified: Secondary | ICD-10-CM

## 2014-11-16 DIAGNOSIS — S0990XA Unspecified injury of head, initial encounter: Secondary | ICD-10-CM | POA: Diagnosis not present

## 2014-11-16 DIAGNOSIS — Z7982 Long term (current) use of aspirin: Secondary | ICD-10-CM | POA: Diagnosis not present

## 2014-11-16 DIAGNOSIS — N179 Acute kidney failure, unspecified: Secondary | ICD-10-CM | POA: Diagnosis not present

## 2014-11-16 DIAGNOSIS — E785 Hyperlipidemia, unspecified: Secondary | ICD-10-CM | POA: Insufficient documentation

## 2014-11-16 LAB — COMPREHENSIVE METABOLIC PANEL
ALT: 59 U/L (ref 17–63)
AST: 39 U/L (ref 15–41)
Albumin: 4.1 g/dL (ref 3.5–5.0)
Alkaline Phosphatase: 57 U/L (ref 38–126)
Anion gap: 9 (ref 5–15)
BUN: 35 mg/dL — ABNORMAL HIGH (ref 6–20)
CO2: 23 mmol/L (ref 22–32)
Calcium: 9 mg/dL (ref 8.9–10.3)
Chloride: 105 mmol/L (ref 101–111)
Creatinine, Ser: 1.86 mg/dL — ABNORMAL HIGH (ref 0.61–1.24)
GFR calc Af Amer: 39 mL/min — ABNORMAL LOW (ref >60)
GFR, EST NON AFRICAN AMERICAN: 34 mL/min — AB (ref >60)
Glucose, Bld: 111 mg/dL — ABNORMAL HIGH (ref 70–99)
POTASSIUM: 4.5 mmol/L (ref 3.5–5.1)
SODIUM: 137 mmol/L (ref 135–145)
Total Bilirubin: 0.8 mg/dL (ref 0.3–1.2)
Total Protein: 7.1 g/dL (ref 6.5–8.1)

## 2014-11-16 LAB — CBC WITH DIFFERENTIAL/PLATELET
BASOS ABS: 0 10*3/uL (ref 0.0–0.1)
Basophils Relative: 0 % (ref 0–1)
EOS PCT: 1 % (ref 0–5)
Eosinophils Absolute: 0.1 10*3/uL (ref 0.0–0.7)
HCT: 36.2 % — ABNORMAL LOW (ref 39.0–52.0)
Hemoglobin: 12.4 g/dL — ABNORMAL LOW (ref 13.0–17.0)
Lymphocytes Relative: 14 % (ref 12–46)
Lymphs Abs: 1.3 10*3/uL (ref 0.7–4.0)
MCH: 30.7 pg (ref 26.0–34.0)
MCHC: 34.3 g/dL (ref 30.0–36.0)
MCV: 89.6 fL (ref 78.0–100.0)
Monocytes Absolute: 0.5 10*3/uL (ref 0.1–1.0)
Monocytes Relative: 6 % (ref 3–12)
Neutro Abs: 7.4 10*3/uL (ref 1.7–7.7)
Neutrophils Relative %: 79 % — ABNORMAL HIGH (ref 43–77)
PLATELETS: 218 10*3/uL (ref 150–400)
RBC: 4.04 MIL/uL — ABNORMAL LOW (ref 4.22–5.81)
RDW: 13 % (ref 11.5–15.5)
WBC: 9.3 10*3/uL (ref 4.0–10.5)

## 2014-11-16 MED ORDER — ONDANSETRON HCL 4 MG/2ML IJ SOLN
4.0000 mg | Freq: Once | INTRAMUSCULAR | Status: AC
  Administered 2014-11-16: 4 mg via INTRAVENOUS
  Filled 2014-11-16: qty 2

## 2014-11-16 MED ORDER — TETANUS-DIPHTH-ACELL PERTUSSIS 5-2.5-18.5 LF-MCG/0.5 IM SUSP
0.5000 mL | Freq: Once | INTRAMUSCULAR | Status: AC
  Administered 2014-11-16: 0.5 mL via INTRAMUSCULAR
  Filled 2014-11-16: qty 0.5

## 2014-11-16 MED ORDER — ONDANSETRON HCL 4 MG PO TABS: 4.0000 mg | ORAL_TABLET | Freq: Four times a day (QID) | ORAL | Status: DC | PRN

## 2014-11-16 MED ORDER — LOPERAMIDE HCL 2 MG PO CAPS
4.0000 mg | ORAL_CAPSULE | Freq: Once | ORAL | Status: AC
  Administered 2014-11-16: 4 mg via ORAL
  Filled 2014-11-16: qty 2

## 2014-11-16 NOTE — ED Notes (Addendum)
Pt. waiting for Ct scan .

## 2014-11-16 NOTE — Discharge Instructions (Signed)
Take loperamide (Imodium AD) as needed for diarrhea.  Your blood tests of your kidneys (BUN, Creatinine) are a little higher thann they were last September. Make sure you are drinking plenty of fluids, and see your doctor in one week to repeat the blood tests.  Nausea and Vomiting Nausea is a sick feeling that often comes before throwing up (vomiting). Vomiting is a reflex where stomach contents come out of your mouth. Vomiting can cause severe loss of body fluids (dehydration). Children and elderly adults can become dehydrated quickly, especially if they also have diarrhea. Nausea and vomiting are symptoms of a condition or disease. It is important to find the cause of your symptoms. CAUSES   Direct irritation of the stomach lining. This irritation can result from increased acid production (gastroesophageal reflux disease), infection, food poisoning, taking certain medicines (such as nonsteroidal anti-inflammatory drugs), alcohol use, or tobacco use.  Signals from the brain.These signals could be caused by a headache, heat exposure, an inner ear disturbance, increased pressure in the brain from injury, infection, a tumor, or a concussion, pain, emotional stimulus, or metabolic problems.  An obstruction in the gastrointestinal tract (bowel obstruction).  Illnesses such as diabetes, hepatitis, gallbladder problems, appendicitis, kidney problems, cancer, sepsis, atypical symptoms of a heart attack, or eating disorders.  Medical treatments such as chemotherapy and radiation.  Receiving medicine that makes you sleep (general anesthetic) during surgery. DIAGNOSIS Your caregiver may ask for tests to be done if the problems do not improve after a few days. Tests may also be done if symptoms are severe or if the reason for the nausea and vomiting is not clear. Tests may include:  Urine tests.  Blood tests.  Stool tests.  Cultures (to look for evidence of infection).  X-rays or other imaging  studies. Test results can help your caregiver make decisions about treatment or the need for additional tests. TREATMENT You need to stay well hydrated. Drink frequently but in small amounts.You may wish to drink water, sports drinks, clear broth, or eat frozen ice pops or gelatin dessert to help stay hydrated.When you eat, eating slowly may help prevent nausea.There are also some antinausea medicines that may help prevent nausea. HOME CARE INSTRUCTIONS   Take all medicine as directed by your caregiver.  If you do not have an appetite, do not force yourself to eat. However, you must continue to drink fluids.  If you have an appetite, eat a normal diet unless your caregiver tells you differently.  Eat a variety of complex carbohydrates (rice, wheat, potatoes, bread), lean meats, yogurt, fruits, and vegetables.  Avoid high-fat foods because they are more difficult to digest.  Drink enough water and fluids to keep your urine clear or pale yellow.  If you are dehydrated, ask your caregiver for specific rehydration instructions. Signs of dehydration may include:  Severe thirst.  Dry lips and mouth.  Dizziness.  Dark urine.  Decreasing urine frequency and amount.  Confusion.  Rapid breathing or pulse. SEEK IMMEDIATE MEDICAL CARE IF:   You have blood or brown flecks (like coffee grounds) in your vomit.  You have black or bloody stools.  You have a severe headache or stiff neck.  You are confused.  You have severe abdominal pain.  You have chest pain or trouble breathing.  You do not urinate at least once every 8 hours.  You develop cold or clammy skin.  You continue to vomit for longer than 24 to 48 hours.  You have a fever. MAKE  SURE YOU:   Understand these instructions.  Will watch your condition.  Will get help right away if you are not doing well or get worse. Document Released: 06/28/2005 Document Revised: 09/20/2011 Document Reviewed:  11/25/2010 [facility] Patient Information 2015 [place], [place]. This information is not intended to replace advice given to you by your health care provider. Make sure you discuss any questions you have with your health care provider.  Diarrhea Diarrhea is frequent loose and watery bowel movements. It can cause you to feel weak and dehydrated. Dehydration can cause you to become tired and thirsty, have a dry mouth, and have decreased urination that often is dark yellow. Diarrhea is a sign of another problem, most often an infection that will not last long. In most cases, diarrhea typically lasts 2-3 days. However, it can last longer if it is a sign of something more serious. It is important to treat your diarrhea as directed by your caregiver to lessen or prevent future episodes of diarrhea. CAUSES  Some common causes include:  Gastrointestinal infections caused by viruses, bacteria, or parasites.  Food poisoning or food allergies.  Certain medicines, such as antibiotics, chemotherapy, and laxatives.  Artificial sweeteners and fructose.  Digestive disorders. HOME CARE INSTRUCTIONS  Ensure adequate fluid intake (hydration): Have 1 cup (8 oz) of fluid for each diarrhea episode. Avoid fluids that contain simple sugars or sports drinks, fruit juices, whole milk products, and sodas. Your urine should be clear or pale yellow if you are drinking enough fluids. Hydrate with an oral rehydration solution that you can purchase at pharmacies, retail stores, and online. You can prepare an oral rehydration solution at home by mixing the following ingredients together:   - tsp table salt.   tsp baking soda.   tsp salt substitute containing potassium chloride.  1  tablespoons sugar.  1 L (34 oz) of water.  Certain foods and beverages may increase the speed at which food moves through the gastrointestinal (GI) tract. These foods and beverages should be avoided and include:  Caffeinated and alcoholic  beverages.  High-fiber foods, such as raw fruits and vegetables, nuts, seeds, and whole grain breads and cereals.  Foods and beverages sweetened with sugar alcohols, such as xylitol, sorbitol, and mannitol.  Some foods may be well tolerated and may help thicken stool including:  Starchy foods, such as rice, toast, pasta, low-sugar cereal, oatmeal, grits, baked potatoes, crackers, and bagels.  Bananas.  Applesauce.  Add probiotic-rich foods to help increase healthy bacteria in the GI tract, such as yogurt and fermented milk products.  Wash your hands well after each diarrhea episode.  Only take over-the-counter or prescription medicines as directed by your caregiver.  Take a warm bath to relieve any burning or pain from frequent diarrhea episodes. SEEK IMMEDIATE MEDICAL CARE IF:   You are unable to keep fluids down.  You have persistent vomiting.  You have blood in your stool, or your stools are black and tarry.  You do not urinate in 6-8 hours, or there is only a small amount of very dark urine.  You have abdominal pain that increases or localizes.  You have weakness, dizziness, confusion, or light-headedness.  You have a severe headache.  Your diarrhea gets worse or does not get better.  You have a fever or persistent symptoms for more than 2-3 days.  You have a fever and your symptoms suddenly get worse. MAKE SURE YOU:   Understand these instructions.  Will watch your condition.  Will get  help right away if you are not doing well or get worse. Document Released: 06/18/2002 Document Revised: 11/12/2013 Document Reviewed: 03/05/2012 [facility] Patient Information 2015 [place], [place]. This information is not intended to replace advice given to you by your health care provider. Make sure you discuss any questions you have with your health care provider.  Ondansetron tablets What is this medicine? ONDANSETRON (on DAN se tron) is used to treat nausea and vomiting  caused by chemotherapy. It is also used to prevent or treat nausea and vomiting after surgery. This medicine may be used for other purposes; ask your health care provider or pharmacist if you have questions. COMMON BRAND NAME(S): Zofran What should I tell my health care provider before I take this medicine? They need to know if you have any of these conditions: -heart disease -history of irregular heartbeat -liver disease -low levels of magnesium or potassium in the blood -an unusual or allergic reaction to ondansetron, granisetron, other medicines, foods, dyes, or preservatives -pregnant or trying to get pregnant -breast-feeding How should I use this medicine? Take this medicine by mouth with a glass of water. Follow the directions on your prescription label. Take your doses at regular intervals. Do not take your medicine more often than directed. Talk to your pediatrician regarding the use of this medicine in children. Special care may be needed. Overdosage: If you think you have taken too much of this medicine contact a poison control center or emergency room at once. NOTE: This medicine is only for you. Do not share this medicine with others. What if I miss a dose? If you miss a dose, take it as soon as you can. If it is almost time for your next dose, take only that dose. Do not take double or extra doses. What may interact with this medicine? Do not take this medicine with any of the following medications: -apomorphine -certain medicines for fungal infections like fluconazole, itraconazole, ketoconazole, posaconazole, voriconazole -cisapride -dofetilide -dronedarone -pimozide -thioridazine -ziprasidone This medicine may also interact with the following medications: -carbamazepine -certain medicines for depression, anxiety, or psychotic disturbances -fentanyl -linezolid -MAOIs like Carbex, Eldepryl, Marplan, Nardil, and Parnate -methylene blue (injected into a vein) -other  medicines that prolong the QT interval (cause an abnormal heart rhythm) -phenytoin -rifampicin -tramadol This list may not describe all possible interactions. Give your health care provider a list of all the medicines, herbs, non-prescription drugs, or dietary supplements you use. Also tell them if you smoke, drink alcohol, or use illegal drugs. Some items may interact with your medicine. What should I watch for while using this medicine? Check with your doctor or health care professional right away if you have any sign of an allergic reaction. What side effects may I notice from receiving this medicine? Side effects that you should report to your doctor or health care professional as soon as possible: -allergic reactions like skin rash, itching or hives, swelling of the face, lips or tongue -breathing problems -confusion -dizziness -fast or irregular heartbeat -feeling faint or lightheaded, falls -fever and chills -loss of balance or coordination -seizures -sweating -swelling of the hands or feet -tightness in the chest -tremors -unusually weak or tired Side effects that usually do not require medical attention (report to your doctor or health care professional if they continue or are bothersome): -constipation or diarrhea -headache This list may not describe all possible side effects. Call your doctor for medical advice about side effects. You may report side effects to FDA at 1-800-FDA-1088. Where  should I keep my medicine? Keep out of the reach of children. Store between 2 and 30 degrees C (36 and 86 degrees F). Throw away any unused medicine after the expiration date. NOTE: This sheet is a summary. It may not cover all possible information. If you have questions about this medicine, talk to your doctor, pharmacist, or health care provider.  2015, [employer]. (2013-04-04 16:27:45)

## 2014-11-16 NOTE — ED Provider Notes (Signed)
CSN: [id number]     Arrival date & time 11/16/14  0400 History   First MD Initiated Contact with Patient 11/16/14 [phone]     Chief Complaint  Patient presents with  . Loss of Consciousness     (Consider location/radiation/quality/duration/timing/severity/associated sxs/prior Treatment) Patient is a 74 y.o. male presenting with syncope. The history is provided by the patient.  Loss of Consciousness He comes into the emergency department after a syncopal episode at home. He states that you noted the stomach was queasy when he woke up this morning and he started having diarrhea after supper. He estimates 10-15 loose to watery bowel movements. He denies abdominal pain. He went to bed and was awakened twice more of with need to have additional bowel movements. The last time, while sitting on the commode, he had increased nausea and got sweaty and lightheaded and passed out with very brief loss of consciousness. He did several laceration to the bridge of his nose when he fell. He is not sure when his last tetanus immunization was. There is no chest pain or palpitations.  Past Medical History  Diagnosis Date  . Stroke 05/2012    R [facility] territory, presented with L paresthesias  . HTN (hypertension)   . HLD (hyperlipidemia)   . History of chicken pox   . Obesity   . 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 Carotid stenosis 01/2013    bilateral, s/p L CEA, plan rpt [place] qyear   Past Surgical History  Procedure Laterality Date  . Tee without cardioversion  05/22/2012    Procedure: TRANSESOPHAGEAL ECHOCARDIOGRAM (TEE);  Surgeon: [name], MD;  Location: [facility] ENDOSCOPY;  Service: Cardiovascular;  Laterality: N/A;  . Cataract extraction  2004    bilateral  . Wisdom tooth extraction    . Endarterectomy  07/21/2012    Left CEA; Surgeon: [name], MD  . Patch angioplasty  07/21/2012    Procedure: PATCH ANGIOPLASTY;  Surgeon:  [name], MD;  [facility];  Service: Vascular;  Laterality: Left;  Using 1 cm x 6 cm Vascu Guard patch   . Carotid endarterectomy Left 07-21-12    cea   Family History  Problem Relation Age of Onset  . CAD Brother 75    MI  . Heart disease Brother     before age 53  . Heart attack Brother   . Hypertension Mother   . Cirrhosis Father 68    EtOHic  . Aneurysm Paternal Uncle 70  . Heart disease Daughter   . Hyperlipidemia Daughter    History  Substance Use Topics  . Smoking status: Former Smoker -- 1.50 packs/day for 33 years    Types: Cigarettes    Quit date: 06/18/1994  . Smokeless tobacco: Never Used  . Alcohol Use: 6.0 - 7.2 oz/week    10-12 Cans of beer per week     Comment: Consumes 5 beers per week    Review of Systems  Cardiovascular: Positive for syncope.  All other systems reviewed and are negative.     Allergies  Review of patient's allergies indicates no known allergies.  Home Medications   Prior to Admission medications   Medication Sig Start Date End Date Taking? Authorizing Provider  acetaminophen (TYLENOL) 500 MG tablet Take 1,000 mg by mouth every 6 (six) hours as needed. For pain    Historical Provider, MD  amLODipine (NORVASC) 10 MG tablet  TAKE 1 TABLET BY MOUTH EVERY DAY 03/25/14   [name], MD  aspirin 81 MG tablet Take 81 mg by mouth daily.    Historical Provider, MD  atorvastatin (LIPITOR) 20 MG tablet TAKE 1 TABLET (20 MG TOTAL) BY MOUTH DAILY AT 6 PM. 03/19/14   Minna Merritts, MD  clopidogrel (PLAVIX) 75 MG tablet TAKE 1 TABLET BY MOUTH ONCE A DAY. 03/25/14   [name], MD  Cyanocobalamin (VITAMIN B 12 PO) Take by mouth daily.    Historical Provider, MD  lisinopril-hydrochlorothiazide (ZESTORETIC) 20-12.5 MG per tablet Take 1 tablet by mouth 2 (two) times daily. 10/23/14   Minna Merritts, MD  Multiple Vitamin (MULTIVITAMIN) tablet Take 1 tablet by mouth daily.    Historical Provider, MD   BP 125/50 mmHg  Pulse 66   Temp(Src) 98.1 F (36.7 C) (Oral)  Resp 15  SpO2 99% Physical Exam  Nursing note and vitals reviewed.  74 year old male, resting comfortably and in no acute distress. Vital signs are normal. Oxygen saturation is 99%, which is normal. Head is normocephalic. Small laceration is present on the left side of the bridge of the nose which is closed without bleeding and does not require suturing. PERRLA, EOMI. Oropharynx is clear. Neck is nontender without adenopathy or JVD. Back is nontender and there is no CVA tenderness. Lungs are clear without rales, wheezes, or rhonchi. Chest is nontender. Heart has regular rate and rhythm without murmur. Abdomen is soft, flat, nontender without masses or hepatosplenomegaly and peristalsis is normoactive. Extremities have no cyanosis or edema, full range of motion is present. Skin is warm and dry without rash. Neurologic: Mental status is normal, cranial nerves are intact, there are no motor or sensory deficits.  ED Course  Procedures (including critical care time) Labs Review Results for orders placed or performed during the hospital encounter of 11/16/14  CBC with Differential  Result Value Ref Range   WBC 9.3 4.0 - 10.5 K/uL   RBC 4.04 (L) 4.22 - 5.81 MIL/uL   Hemoglobin 12.4 (L) 13.0 - 17.0 g/dL   HCT 36.2 (L) 39.0 - 52.0 %   MCV 89.6 78.0 - 100.0 fL   MCH 30.7 26.0 - 34.0 pg   MCHC 34.3 30.0 - 36.0 g/dL   RDW 13.0 11.5 - 15.5 %   Platelets 218 150 - 400 K/uL   Neutrophils Relative % 79 (H) 43 - 77 %   Neutro Abs 7.4 1.7 - 7.7 K/uL   Lymphocytes Relative 14 12 - 46 %   Lymphs Abs 1.3 0.7 - 4.0 K/uL   Monocytes Relative 6 3 - 12 %   Monocytes Absolute 0.5 0.1 - 1.0 K/uL   Eosinophils Relative 1 0 - 5 %   Eosinophils Absolute 0.1 0.0 - 0.7 K/uL   Basophils Relative 0 0 - 1 %   Basophils Absolute 0.0 0.0 - 0.1 K/uL  Comprehensive metabolic panel  Result Value Ref Range   Sodium 137 135 - 145 mmol/L   Potassium 4.5 3.5 - 5.1 mmol/L    Chloride 105 101 - 111 mmol/L   CO2 23 22 - 32 mmol/L   Glucose, Bld 111 (H) 70 - 99 mg/dL   BUN 35 (H) 6 - 20 mg/dL   Creatinine, Ser 1.86 (H) 0.61 - 1.24 mg/dL   Calcium 9.0 8.9 - 10.3 mg/dL   Total Protein 7.1 6.5 - 8.1 g/dL   Albumin 4.1 3.5 - 5.0 g/dL   AST 39 15 -  41 U/L   ALT 59 17 - 63 U/L   Alkaline Phosphatase 57 38 - 126 U/L   Total Bilirubin 0.8 0.3 - 1.2 mg/dL   GFR calc non Af Amer 34 (L) >60 mL/min   GFR calc Af Amer 39 (L) >60 mL/min   Anion gap 9 5 - 15   Imaging Review Ct Head Wo Contrast  11/16/2014   EXAM: CT HEAD WITHOUT CONTRAST  CT MAXILLOFACIAL WITHOUT CONTRAST  CT CERVICAL SPINE WITHOUT CONTRAST  TECHNIQUE: Multidetector CT imaging of the head, cervical spine, and maxillofacial structures were performed using the standard protocol without intravenous contrast. Multiplanar CT image reconstructions of the cervical spine and maxillofacial structures were also generated.  COMPARISON:  None.  FINDINGS: CT HEAD FINDINGS  There is no intracranial hemorrhage or extra-axial fluid collection. There is no evidence of acute infarction. There is remote right cerebellar infarction, new from 05/21/2012. There is remote left caudate head infarction, unchanged. There is remote right occipital infarction, unchanged. There is mild generalized atrophy and chronic microvascular disease. The calvarium and skullbase are intact.  CT MAXILLOFACIAL FINDINGS  No facial fracture is evident. Orbital floors are intact. Zygomatic arches and pterygoid plates are intact. Mandible and TMJs are intact.  CT CERVICAL SPINE FINDINGS  The vertebral column, pedicles and facet articulations are intact. There is no evidence of acute fracture. No acute soft tissue abnormalities are evident.  Moderate degenerative changes are present at the atlantoaxial articulation. There are mild degenerative disc changes at C5-6.  IMPRESSION: 1. Negative for acute intracranial traumatic injury. 2. Multiple remote infarctions,  including a right cerebellar infarction which is new from 05/21/2012. Mild generalized atrophy and chronic microvascular disease. 3. Negative for acute facial fracture 4. Negative for acute cervical spine fracture   Electronically Signed   By: Andreas Newport M.D.   On: 11/16/2014 05:57   Ct Cervical Spine Wo Contrast  11/16/2014   EXAM: CT HEAD WITHOUT CONTRAST  CT MAXILLOFACIAL WITHOUT CONTRAST  CT CERVICAL SPINE WITHOUT CONTRAST  TECHNIQUE: Multidetector CT imaging of the head, cervical spine, and maxillofacial structures were performed using the standard protocol without intravenous contrast. Multiplanar CT image reconstructions of the cervical spine and maxillofacial structures were also generated.  COMPARISON:  None.  FINDINGS: CT HEAD FINDINGS  There is no intracranial hemorrhage or extra-axial fluid collection. There is no evidence of acute infarction. There is remote right cerebellar infarction, new from 05/21/2012. There is remote left caudate head infarction, unchanged. There is remote right occipital infarction, unchanged. There is mild generalized atrophy and chronic microvascular disease. The calvarium and skullbase are intact.  CT MAXILLOFACIAL FINDINGS  No facial fracture is evident. Orbital floors are intact. Zygomatic arches and pterygoid plates are intact. Mandible and TMJs are intact.  CT CERVICAL SPINE FINDINGS  The vertebral column, pedicles and facet articulations are intact. There is no evidence of acute fracture. No acute soft tissue abnormalities are evident.  Moderate degenerative changes are present at the atlantoaxial articulation. There are mild degenerative disc changes at C5-6.  IMPRESSION: 1. Negative for acute intracranial traumatic injury. 2. Multiple remote infarctions, including a right cerebellar infarction which is new from 05/21/2012. Mild generalized atrophy and chronic microvascular disease. 3. Negative for acute facial fracture 4. Negative for acute cervical spine  fracture   Electronically Signed   By: Andreas Newport M.D.   On: 11/16/2014 05:57   Ct Maxillofacial Wo Cm  11/16/2014   EXAM: CT HEAD WITHOUT CONTRAST  CT MAXILLOFACIAL WITHOUT CONTRAST  CT CERVICAL SPINE WITHOUT CONTRAST  TECHNIQUE: Multidetector CT imaging of the head, cervical spine, and maxillofacial structures were performed using the standard protocol without intravenous contrast. Multiplanar CT image reconstructions of the cervical spine and maxillofacial structures were also generated.  COMPARISON:  None.  FINDINGS: CT HEAD FINDINGS  There is no intracranial hemorrhage or extra-axial fluid collection. There is no evidence of acute infarction. There is remote right cerebellar infarction, new from 05/21/2012. There is remote left caudate head infarction, unchanged. There is remote right occipital infarction, unchanged. There is mild generalized atrophy and chronic microvascular disease. The calvarium and skullbase are intact.  CT MAXILLOFACIAL FINDINGS  No facial fracture is evident. Orbital floors are intact. Zygomatic arches and pterygoid plates are intact. Mandible and TMJs are intact.  CT CERVICAL SPINE FINDINGS  The vertebral column, pedicles and facet articulations are intact. There is no evidence of acute fracture. No acute soft tissue abnormalities are evident.  Moderate degenerative changes are present at the atlantoaxial articulation. There are mild degenerative disc changes at C5-6.  IMPRESSION: 1. Negative for acute intracranial traumatic injury. 2. Multiple remote infarctions, including a right cerebellar infarction which is new from 05/21/2012. Mild generalized atrophy and chronic microvascular disease. 3. Negative for acute facial fracture 4. Negative for acute cervical spine fracture   Electronically Signed   By: Andreas Newport M.D.   On: 11/16/2014 05:57     EKG Interpretation   Date/Time:  Saturday Nov 16 2014 04:03:13 EDT Ventricular Rate:  60 PR Interval:  171 QRS  Duration: 84 QT Interval:  398 QTC Calculation: 398 R Axis:   60 Text Interpretation:  Sinus rhythm Low voltage, precordial leads When  compared with ECG of 05/21/2012, No significant change was found Confirmed  by [facility]  MD, Shelita Steptoe ([id number]) on 11/16/2014 4:36:21 AM      MDM   Final diagnoses:  Syncope  Fall at home, initial encounter  Laceration of face, initial encounter  Acute on chronic renal insufficiency  Nausea vomiting and diarrhea  Normochromic normocytic anemia    Nausea, vomiting, diarrhea which is likely a viral gastroenteritis. Syncope which sounds most like vasovagal syncope. There may also be a component of dehydration from his diarrhea. EMS had given him 1 L of fluid prior to his arriving in the ED. He is given a TDaP booster and will be sent for CT of head, maxillofacial, cervical spine.  CT scans are unremarkable. Laboratory workup shows stable anemia and slight worsening of renal insufficiency which is probably related to dehydration. He feels much better after additional fluid and ondansetron and loperamide. Orthostatic vital signs do not show significant change in heart rate or blood pressure. He is discharged with prescription for ondansetron and told to use over-the-counter loperamide as needed for diarrhea. He is to follow-up with his PCP to repeat his BUN and creatinine in one week.  Delora Fuel, MD 29/51/88 [id number]

## 2014-11-16 NOTE — ED Notes (Signed)
Patient transported to CT scan . 

## 2014-11-16 NOTE — ED Notes (Signed)
Pt. arrived with EMS from home reports syncopal episode this morning , hit face on the floor with brief LOC , alert and oriented at arrival , presents with mild nasal swelling and redness at forehead . Speech clear / no facial asymmetry , equal strong grips with no arm drift. Pt. stated emesis x1 and diarrhea x3 this morning . CBG = 135 by EMS, received NS IV bolus prior to arrival.

## 2014-11-20 ENCOUNTER — Encounter: Payer: Self-pay | Admitting: Family Medicine

## 2014-11-20 ENCOUNTER — Ambulatory Visit (INDEPENDENT_AMBULATORY_CARE_PROVIDER_SITE_OTHER): Payer: Medicare Other | Admitting: Family Medicine

## 2014-11-20 VITALS — BP 140/64 | HR 56 | Temp 97.9°F | Wt 187.5 lb

## 2014-11-20 DIAGNOSIS — R55 Syncope and collapse: Secondary | ICD-10-CM

## 2014-11-20 DIAGNOSIS — I6523 Occlusion and stenosis of bilateral carotid arteries: Secondary | ICD-10-CM

## 2014-11-20 DIAGNOSIS — N289 Disorder of kidney and ureter, unspecified: Secondary | ICD-10-CM | POA: Diagnosis not present

## 2014-11-20 DIAGNOSIS — K5900 Constipation, unspecified: Secondary | ICD-10-CM | POA: Diagnosis not present

## 2014-11-20 HISTORY — DX: Syncope and collapse: R55

## 2014-11-20 LAB — RENAL FUNCTION PANEL
Albumin: 4 g/dL (ref 3.5–5.2)
BUN: 32 mg/dL — ABNORMAL HIGH (ref 6–23)
CHLORIDE: 107 meq/L (ref 96–112)
CO2: 27 meq/L (ref 19–32)
Calcium: 8.8 mg/dL (ref 8.4–10.5)
Creatinine, Ser: 1.72 mg/dL — ABNORMAL HIGH (ref 0.40–1.50)
GFR: 41.34 mL/min — AB (ref >60.00)
Glucose, Bld: 96 mg/dL (ref 70–99)
Phosphorus: 2.6 mg/dL (ref 2.3–4.6)
Potassium: 4 mEq/L (ref 3.5–5.1)
Sodium: 139 mEq/L (ref 135–145)

## 2014-11-20 MED ORDER — POLYETHYLENE GLYCOL 3350 17 GM/SCOOP PO POWD: 17.0000 g | Freq: Every day | ORAL | Status: DC | PRN

## 2014-11-20 NOTE — Progress Notes (Signed)
BP 140/64 mmHg  Pulse 56  Temp(Src) 97.9 F (36.6 C) (Oral)  Wt 187 lb 8 oz (85.049 kg)   CC: ER f/u visit  Subjective:    Patient ID: Craig Avery, male    DOB: 08-08-1940, 74 y.o.   MRN: [id number]  HPI: Craig Avery is a 74 y.o. male presenting on 11/20/2014 for Follow-up   Here today after ER evaluation 11/16/2014 for syncope after diarrheal illness. Records reviewed. Thought vasovagal and dehydration related syncope. Treated with IVF and Tdap. CT head, maxillofacial and cervical spine unrevealing. Treated with zofran and loperamide prn.   Diarrhea has resolved, now having some constipation. Last BM was 4 days ago. Passing gas ok. Appetite decreased. No abd pain. Not taking zofran or immodium. Unsure what caused diarrhea.   Needs to have BUN/Cr checked today.   Relevant past medical, surgical, family and social history reviewed and updated as indicated. Interim medical history since our last visit reviewed. Allergies and medications reviewed and updated. Current Outpatient Prescriptions on File Prior to Visit  Medication Sig  . amLODipine (NORVASC) 10 MG tablet TAKE 1 TABLET BY MOUTH EVERY DAY  . aspirin 81 MG tablet Take 81 mg by mouth daily.  Marland Kitchen atorvastatin (LIPITOR) 20 MG tablet TAKE 1 TABLET (20 MG TOTAL) BY MOUTH DAILY AT 6 PM.  . clopidogrel (PLAVIX) 75 MG tablet TAKE 1 TABLET BY MOUTH ONCE A DAY.  Marland Kitchen Cyanocobalamin (VITAMIN B 12 PO) Take by mouth daily.  Marland Kitchen lisinopril-hydrochlorothiazide (ZESTORETIC) 20-12.5 MG per tablet Take 1 tablet by mouth 2 (two) times daily.   No current facility-administered medications on file prior to visit.    Review of Systems Per HPI unless specifically indicated above     Objective:    BP 140/64 mmHg  Pulse 56  Temp(Src) 97.9 F (36.6 C) (Oral)  Wt 187 lb 8 oz (85.049 kg)  Wt Readings from Last 3 Encounters:  11/20/14 187 lb 8 oz (85.049 kg)  10/21/14 192 lb 4 oz (87.204 kg)  03/25/14 189 lb 8 oz (85.957 kg)    Physical  Exam  Constitutional: He appears well-developed and well-nourished. No distress.  HENT:  Mouth/Throat: Oropharynx is clear and moist. No oropharyngeal exudate.  Bruising forehead and nasal bridge  Neck: Normal range of motion. Neck supple.  Cardiovascular: Normal rate, regular rhythm, normal heart sounds and intact distal pulses.   No murmur heard. Pulmonary/Chest: Effort normal and breath sounds normal. No respiratory distress. He has no wheezes. He has no rales.  Abdominal: Soft. Bowel sounds are normal. He exhibits no distension and no mass. There is no tenderness. There is no rebound and no guarding.  Musculoskeletal: He exhibits no edema.  Skin: Skin is warm and dry. No rash noted.  Psychiatric: He has a normal mood and affect.  Nursing note and vitals reviewed.  Results for orders placed or performed during the hospital encounter of 11/16/14  CBC with Differential  Result Value Ref Range   WBC 9.3 4.0 - 10.5 K/uL   RBC 4.04 (L) 4.22 - 5.81 MIL/uL   Hemoglobin 12.4 (L) 13.0 - 17.0 g/dL   HCT 36.2 (L) 39.0 - 52.0 %   MCV 89.6 78.0 - 100.0 fL   MCH 30.7 26.0 - 34.0 pg   MCHC 34.3 30.0 - 36.0 g/dL   RDW 13.0 11.5 - 15.5 %   Platelets 218 150 - 400 K/uL   Neutrophils Relative % 79 (H) 43 - 77 %   Neutro Abs  7.4 1.7 - 7.7 K/uL   Lymphocytes Relative 14 12 - 46 %   Lymphs Abs 1.3 0.7 - 4.0 K/uL   Monocytes Relative 6 3 - 12 %   Monocytes Absolute 0.5 0.1 - 1.0 K/uL   Eosinophils Relative 1 0 - 5 %   Eosinophils Absolute 0.1 0.0 - 0.7 K/uL   Basophils Relative 0 0 - 1 %   Basophils Absolute 0.0 0.0 - 0.1 K/uL  Comprehensive metabolic panel  Result Value Ref Range   Sodium 137 135 - 145 mmol/L   Potassium 4.5 3.5 - 5.1 mmol/L   Chloride 105 101 - 111 mmol/L   CO2 23 22 - 32 mmol/L   Glucose, Bld 111 (H) 70 - 99 mg/dL   BUN 35 (H) 6 - 20 mg/dL   Creatinine, Ser 1.86 (H) 0.61 - 1.24 mg/dL   Calcium 9.0 8.9 - 10.3 mg/dL   Total Protein 7.1 6.5 - 8.1 g/dL   Albumin 4.1 3.5 -  5.0 g/dL   AST 39 15 - 41 U/L   ALT 59 17 - 63 U/L   Alkaline Phosphatase 57 38 - 126 U/L   Total Bilirubin 0.8 0.3 - 1.2 mg/dL   GFR calc non Af Amer 34 (L) >60 mL/min   GFR calc Af Amer 39 (L) >60 mL/min   Anion gap 9 5 - 15      Assessment & Plan:   Problem List Items Addressed This Visit    Vasovagal syncope - Primary    Associated with recent diarrheal episode and dehydration. Now resolved.      Renal insufficiency    Reviewed Cr trend over last 2 years with patient. Check renal panel today. Discussed importance of good hydration status - pt could do better with this.       Relevant Orders   Renal function panel   Constipation    Mild after initial diarrheal illness. Will start stool softener and if ineffective, will start miralax. Discussed importance of increased water intake. No evidence of obstruction or other intra abd process today.          Follow up plan: Return in about 6 months (around 05/23/2015), or as needed, for medicare wellness visit.

## 2014-11-20 NOTE — Assessment & Plan Note (Signed)
Reviewed Cr trend over last 2 years with patient. Check renal panel today. Discussed importance of good hydration status - pt could do better with this.

## 2014-11-20 NOTE — Patient Instructions (Addendum)
Check on dose of b12 vitamin - if 1057mcg causing indigestion, try 563mcg.  Labwork today. Increase water intake. Start stool softener again (like colace or docusate). If constipation not better, then may start miralax (prescription printed out today).  Good to see you today, call us with questions. Return in 6 months for medicare wellness visit

## 2014-11-20 NOTE — Assessment & Plan Note (Signed)
Associated with recent diarrheal episode and dehydration. Now resolved.

## 2014-11-20 NOTE — Assessment & Plan Note (Signed)
Mild after initial diarrheal illness. Will start stool softener and if ineffective, will start miralax. Discussed importance of increased water intake. No evidence of obstruction or other intra abd process today.

## 2014-11-20 NOTE — Progress Notes (Signed)
Pre visit review using our clinic review tool, if applicable. No additional management support is needed unless otherwise documented below in the visit note. 

## 2014-11-21 ENCOUNTER — Encounter: Payer: Self-pay | Admitting: *Deleted

## 2015-01-17 ENCOUNTER — Other Ambulatory Visit: Payer: Self-pay | Admitting: Family Medicine

## 2015-01-31 ENCOUNTER — Telehealth: Payer: Self-pay | Admitting: *Deleted

## 2015-01-31 DIAGNOSIS — I6523 Occlusion and stenosis of bilateral carotid arteries: Secondary | ICD-10-CM

## 2015-01-31 NOTE — Telephone Encounter (Signed)
Pt left voicemail at Triage requesting a referral for vascular and vein specialist. Pt received a letter from them saying a referral is needed from [place] so his insurance will pay for the visit

## 2015-02-02 NOTE — Telephone Encounter (Signed)
Ordered. Thanks

## 2015-02-03 NOTE — Telephone Encounter (Signed)
Patient notified by telephone that the referral coordinator will be in touch with him to get this taken care of.

## 2015-02-09 ENCOUNTER — Other Ambulatory Visit: Payer: Self-pay | Admitting: Cardiovascular Disease

## 2015-02-14 ENCOUNTER — Encounter: Payer: Self-pay | Admitting: Family

## 2015-02-17 ENCOUNTER — Ambulatory Visit (INDEPENDENT_AMBULATORY_CARE_PROVIDER_SITE_OTHER): Payer: Medicare Other | Admitting: Family

## 2015-02-17 ENCOUNTER — Ambulatory Visit (HOSPITAL_COMMUNITY)
Admission: RE | Admit: 2015-02-17 | Discharge: 2015-02-17 | Disposition: A | Discharge status: Home / Self Care | Payer: Medicare Other | Source: Ambulatory Visit | Attending: Family | Admitting: Family

## 2015-02-17 ENCOUNTER — Encounter: Payer: Self-pay | Admitting: Family

## 2015-02-17 VITALS — BP 149/66 | HR 55 | Temp 97.1°F | Resp 14 | Ht 64.0 in | Wt 191.0 lb

## 2015-02-17 DIAGNOSIS — Z48812 Encounter for surgical aftercare following surgery on the circulatory system: Secondary | ICD-10-CM | POA: Insufficient documentation

## 2015-02-17 DIAGNOSIS — Z9889 Other specified postprocedural states: Secondary | ICD-10-CM

## 2015-02-17 DIAGNOSIS — I6523 Occlusion and stenosis of bilateral carotid arteries: Secondary | ICD-10-CM | POA: Insufficient documentation

## 2015-02-17 DIAGNOSIS — Z87891 Personal history of nicotine dependence: Secondary | ICD-10-CM

## 2015-02-17 NOTE — Patient Instructions (Signed)
Stroke Prevention Some medical conditions and behaviors are associated with an increased chance of having a stroke. You may prevent a stroke by making healthy choices and managing medical conditions. HOW CAN I REDUCE MY RISK OF HAVING A STROKE?   Stay physically active. Get at least 30 minutes of activity on most or all days.  Do not smoke. It may also be helpful to avoid exposure to secondhand smoke.  Limit alcohol use. Moderate alcohol use is considered to be:  No more than 2 drinks per day for men.  No more than 1 drink per day for nonpregnant women.  Eat healthy foods. This involves:  Eating 5 or more servings of fruits and vegetables a day.  Making dietary changes that address high blood pressure (hypertension), high cholesterol, diabetes, or obesity.  Manage your cholesterol levels.  Making food choices that are high in fiber and low in saturated fat, trans fat, and cholesterol may control cholesterol levels.  Take any prescribed medicines to control cholesterol as directed by your health care provider.  Manage your diabetes.  Controlling your carbohydrate and sugar intake is recommended to manage diabetes.  Take any prescribed medicines to control diabetes as directed by your health care provider.  Control your hypertension.  Making food choices that are low in salt (sodium), saturated fat, trans fat, and cholesterol is recommended to manage hypertension.  Take any prescribed medicines to control hypertension as directed by your health care provider.  Maintain a healthy weight.  Reducing calorie intake and making food choices that are low in sodium, saturated fat, trans fat, and cholesterol are recommended to manage weight.  Stop drug abuse.  Avoid taking birth control pills.  Talk to your health care provider about the risks of taking birth control pills if you are over 35 years old, smoke, get migraines, or have ever had a blood clot.  Get evaluated for sleep  disorders (sleep apnea).  Talk to your health care provider about getting a sleep evaluation if you snore a lot or have excessive sleepiness.  Take medicines only as directed by your health care provider.  For some people, aspirin or blood thinners (anticoagulants) are helpful in reducing the risk of forming abnormal blood clots that can lead to stroke. If you have the irregular heart rhythm of atrial fibrillation, you should be on a blood thinner unless there is a good reason you cannot take them.  Understand all your medicine instructions.  Make sure that other conditions (such as anemia or atherosclerosis) are addressed. SEEK IMMEDIATE MEDICAL CARE IF:   You have sudden weakness or numbness of the face, arm, or leg, especially on one side of the body.  Your face or eyelid droops to one side.  You have sudden confusion.  You have trouble speaking (aphasia) or understanding.  You have sudden trouble seeing in one or both eyes.  You have sudden trouble walking.  You have dizziness.  You have a loss of balance or coordination.  You have a sudden, severe headache with no known cause.  You have new chest pain or an irregular heartbeat. Any of these symptoms may represent a serious problem that is an emergency. Do not wait to see if the symptoms will go away. Get medical help at once. Call your local emergency services (911 in [place] Do not drive yourself to the hospital. Document Released: 08/05/2004 Document Revised: 11/12/2013 Document Reviewed: 12/29/2012 Exit[employer] Patient Information 2015 [employer]. This information is not intended to replace advice given   to you by your health care provider. Make sure you discuss any questions you have with your health care provider.  

## 2015-02-17 NOTE — Progress Notes (Signed)
Established Carotid Patient   History of Present Illness  Craig Avery is a 74 y.o. male patient of Dr. Trula Slade who is status post left carotid endarterectomy on 07/21/2012 for asymptomatic stenosis. Intraoperative findings included 90% stenosis. He is doing well without complaints today. He denies any current neurologic symptoms.  The patient has a history of right brain stroke as manifested by tingling on the left side of his body and severe hypertension in 2014, just before the left CEA. He has not had any symptoms since we started him on maximal medical therapy. He has no significant right carotid stenosis. He continues to be a nonsmoker. He is medically managed with dual antiplatelet therapy. He is on a statin for hypercholesterolemia. He returns today for follow up. He denies any history of amaurosis fugax or aphasia. His right hip joint hurts if he does much walking, denies non healing wounds. He states his grandmother did not smoke, did not have DM, (did not have these atherosclerotic risk factors) and had both legs amputated due to poor circulation.  Pt denies New Medical or Surgical History.  Pt Diabetic: No Pt smoker: former smoker, quit in 1995  Pt meds include: Statin : Yes ASA: Yes Other anticoagulants/antiplatelets: Plavix   Past Medical History  Diagnosis Date  . Stroke 05/2012    R PCA territory, presented with L paresthesias  . HTN (hypertension)   . HLD (hyperlipidemia)   . History of chicken pox   . Obesity   . 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 Carotid stenosis 01/2013    bilateral, s/p L CEA, plan rpt [place] qyear    Social History History  Substance Use Topics  . Smoking status: Former Smoker -- 1.50 packs/day for 33 years    Types: Cigarettes    Quit date: 06/18/1994  . Smokeless tobacco: Never Used  . Alcohol Use: 6.0 - 7.2 oz/week    10-12 Cans of beer per week     Comment:  Consumes 5 beers per week    Family History Family History  Problem Relation Age of Onset  . CAD Brother 9    MI  . Heart disease Brother     before age 49  . Heart attack Brother   . Hypertension Mother   . Cirrhosis Father 5    EtOHic  . Aneurysm Paternal Uncle 58  . Heart disease Daughter   . Hyperlipidemia Daughter     Surgical History Past Surgical History  Procedure Laterality Date  . Tee without cardioversion  05/22/2012    Procedure: TRANSESOPHAGEAL ECHOCARDIOGRAM (TEE);  Surgeon: [name], MD;  Location: [facility] ENDOSCOPY;  Service: Cardiovascular;  Laterality: N/A;  . Cataract extraction  2004    bilateral  . Wisdom tooth extraction    . Endarterectomy  07/21/2012    Left CEA; Surgeon: [name], MD  . Patch angioplasty  07/21/2012    Procedure: PATCH ANGIOPLASTY;  Surgeon: [name], MD;  Location: [facility] OR;  Service: Vascular;  Laterality: Left;  Using 1 cm x 6 cm Vascu Guard patch   . Carotid endarterectomy Left 07-21-12    cea    No Known Allergies  Current Outpatient Prescriptions  Medication Sig Dispense Refill  . amLODipine (NORVASC) 10 MG tablet TAKE 1 TABLET BY MOUTH EVERY DAY 90 tablet 3  . aspirin 81 MG tablet Take 81 mg by mouth daily.    Marland Kitchen  atorvastatin (LIPITOR) 20 MG tablet TAKE 1 TABLET BY MOUTH DAILY AT 6PM 90 tablet 3  . clopidogrel (PLAVIX) 75 MG tablet TAKE 1 TABLET BY MOUTH ONCE A DAY 90 tablet 1  . Cyanocobalamin (VITAMIN B 12 PO) Take by mouth daily.    Marland Kitchen docusate sodium (COLACE) 100 MG capsule Take 100 mg by mouth daily as needed for mild constipation.    Marland Kitchen lisinopril-hydrochlorothiazide (ZESTORETIC) 20-12.5 MG per tablet Take 1 tablet by mouth 2 (two) times daily. 180 tablet 3  . polyethylene glycol powder (GLYCOLAX/MIRALAX) powder Take 17 g by mouth daily as needed for moderate constipation. 850 g 1   No current facility-administered medications for this visit.    Review of Systems : See HPI for pertinent positives and  negatives.  Physical Examination  Filed Vitals:   02/17/15 1120 02/17/15 1124 02/17/15 1130 02/17/15 1132  BP: 162/66 158/65 158/69 149/66  Pulse: 52 54 53 55  Temp:  97.1 F (36.2 C)    TempSrc:  Oral    Resp:  14    Height:  5\' 4"  (1.626 m)    Weight:  191 lb (86.637 kg)    SpO2:  98%     Body mass index is 32.77 kg/(m^2).  General: WDWN obese male in NAD GAIT: normal Eyes: PERRLA Pulmonary: Non-labored, CTAB, Negative Rales, Negative rhonchi, & Negative wheezing.  Cardiac: regular Rhythm, no detected murmur.  VASCULAR EXAM Carotid Bruits Right Left   Negative Negative   Radial pulses are 3+ palpable and equal.      LE Pulses Right Left   POPLITEAL not palpable  not palpable   POSTERIOR TIBIAL 1+ palpable  not palpable    DORSALIS PEDIS  ANTERIOR TIBIAL not palpable  not palpable     Gastrointestinal: soft, nontender, BS WNL, no r/g,no palpable masses.  Musculoskeletal: Negative muscle atrophy/wasting. M/S 5/5 throughout, Extremities without ischemic changes. Feet appear well perfused, no wounds, no edema in LE's.   Neurologic: A&O X 3; Appropriate Affect, Speech is normal CN 2-12 intact, Pain and light touch intact in extremities, Motor exam as listed above.         Non-Invasive Vascular Imaging CAROTID DUPLEX 02/17/2015   CEREBROVASCULAR DUPLEX EVALUATION    INDICATION: Carotid artery disease     PREVIOUS INTERVENTION(S): Left carotid endarterectomy 02/05/2013    DUPLEX EXAM:     RIGHT  LEFT  Peak Systolic Velocities (cm/s) End Diastolic Velocities (cm/s) Plaque LOCATION Peak Systolic Velocities (cm/s) End Diastolic Velocities (cm/s) Plaque  92 9  CCA PROXIMAL 132 17   76 11  CCA MID 100 17   78 12 HT CCA DISTAL 87 15   474 23 HT ECA 161 10   72 16 HT ICA PROXIMAL 84 14    161 25  ICA MID 92 20   140 21  ICA DISTAL 111 25     2.11 ICA / CCA Ratio (PSV) NA  Antegrade  Vertebral Flow Antegrade   989 Brachial Systolic Pressure (mmHg) 211  Multiphasic (Subclavian artery) Brachial Artery Waveforms Multiphasic (Subclavian artery)    Plaque Morphology:  HM = Homogeneous, HT = Heterogeneous, CP = Calcific Plaque, SP = Smooth Plaque, IP = Irregular Plaque     ADDITIONAL FINDINGS: Right external carotid artery stenosis.    IMPRESSION: Right internal carotid artery velocities suggest a <40% stenosis.  Patent left carotid endarterectomy site with no evidence of hyperplasia or restenosis.     Compared to the previous exam:  No significant  change in comparison to the last exam on 02/11/2014.      Assessment: IRAN ROWE is a 74 y.o. male who is status post left carotid endarterectomy on 07/21/2012 for asymptomatic stenosis. Intraoperative findings included 90% stenosis. He is doing well without complaints today. He denies any current neurologic symptoms.  The patient has a history of right brain stroke as manifested by tingling on the left side of his body and severe hypertension in 2014, just before the left CEA. He has not had any symptoms since we started him on maximal medical therapy. Today's carotid Duplex suggests <40% right ICA stenosis.  Patent left carotid endarterectomy site with no evidence of hyperplasia or restenosis. No significant change in comparison to the last exam on 02/11/2014.  Pt advised to see his PCP soon re his elevated blood blood pressure.   Plan: Follow-up in 1 year with Carotid Duplex.   I discussed in depth with the patient the nature of atherosclerosis, and emphasized the importance of maximal medical management including strict control of blood pressure, blood glucose, and lipid levels, obtaining regular exercise, and continued cessation of smoking.  The patient is aware that without maximal medical management the underlying  atherosclerotic disease process will progress, limiting the benefit of any interventions. The patient was given information about stroke prevention and what symptoms should prompt the patient to seek immediate medical care. Thank you for allowing [place] to participate in this patient's care.  Clemon Chambers, RN, MSN, FNP-C [facility] Office: [phone]  Clinic Physician: Kellie Simmering  02/17/2015 11:37 AM

## 2015-02-17 NOTE — Progress Notes (Signed)
Filed Vitals:   02/17/15 1120 02/17/15 1124 02/17/15 1130 02/17/15 1132  BP: 162/66 158/65 158/69 149/66  Pulse: 52 54 53 55  Temp:  97.1 F (36.2 C)    TempSrc:  Oral    Resp:  14    Height:  5\' 4"  (1.626 m)    Weight:  191 lb (86.637 kg)    SpO2:  98%

## 2015-02-26 ENCOUNTER — Ambulatory Visit: Payer: Medicare Other | Admitting: Family Medicine

## 2015-02-26 ENCOUNTER — Telehealth: Payer: Self-pay | Admitting: Family Medicine

## 2015-02-26 NOTE — Telephone Encounter (Signed)
Looks like this was old appt that wasn't cancelled as pt has medicare wellness visit scheduled for 05/2015. No need to f/u with pt, plz don't charge no show fee.

## 2015-02-26 NOTE — Telephone Encounter (Signed)
Patient did not come in for their appointment today for check bp  Please let me know if patient needs to be contacted immediately for follow up or no follow up needed.

## 2015-03-25 ENCOUNTER — Ambulatory Visit (INDEPENDENT_AMBULATORY_CARE_PROVIDER_SITE_OTHER): Payer: Medicare Other | Admitting: Internal Medicine

## 2015-03-25 VITALS — BP 130/72 | HR 58 | Temp 98.1°F | Wt 191.0 lb

## 2015-03-25 DIAGNOSIS — D239 Other benign neoplasm of skin, unspecified: Secondary | ICD-10-CM | POA: Diagnosis not present

## 2015-03-25 DIAGNOSIS — J069 Acute upper respiratory infection, unspecified: Secondary | ICD-10-CM | POA: Diagnosis not present

## 2015-03-25 DIAGNOSIS — I6523 Occlusion and stenosis of bilateral carotid arteries: Secondary | ICD-10-CM | POA: Diagnosis not present

## 2015-03-25 DIAGNOSIS — D1800 Hemangioma unspecified site: Secondary | ICD-10-CM | POA: Diagnosis not present

## 2015-03-25 DIAGNOSIS — L821 Other seborrheic keratosis: Secondary | ICD-10-CM | POA: Diagnosis not present

## 2015-03-25 DIAGNOSIS — B9789 Other viral agents as the cause of diseases classified elsewhere: Principal | ICD-10-CM

## 2015-03-25 DIAGNOSIS — E669 Obesity, unspecified: Secondary | ICD-10-CM | POA: Insufficient documentation

## 2015-03-25 DIAGNOSIS — L570 Actinic keratosis: Secondary | ICD-10-CM | POA: Diagnosis not present

## 2015-03-25 MED ORDER — HYDROCODONE-HOMATROPINE 5-1.5 MG/5ML PO SYRP: 5.0000 mL | ORAL_SOLUTION | Freq: Three times a day (TID) | ORAL | Status: DC | PRN

## 2015-03-25 NOTE — Progress Notes (Signed)
Pre visit review using our clinic review tool, if applicable. No additional management support is needed unless otherwise documented below in the visit note. 

## 2015-03-25 NOTE — Patient Instructions (Signed)
Cough, Adult  A cough is a reflex that helps clear your throat and airways. It can help heal the body or may be a reaction to an irritated airway. A cough may only last 2 or 3 weeks (acute) or may last more than 8 weeks (chronic).  CAUSES Acute cough:  Viral or bacterial infections. Chronic cough:  Infections.  Allergies.  Asthma.  Post-nasal drip.  Smoking.  Heartburn or acid reflux.  Some medicines.  Chronic lung problems (COPD).  Cancer. SYMPTOMS   Cough.  Fever.  Chest pain.  Increased breathing rate.  High-pitched whistling sound when breathing (wheezing).  Colored mucus that you cough up (sputum). TREATMENT   A bacterial cough may be treated with antibiotic medicine.  A viral cough must run its course and will not respond to antibiotics.  Your caregiver may recommend other treatments if you have a chronic cough. HOME CARE INSTRUCTIONS   Only take over-the-counter or prescription medicines for pain, discomfort, or fever as directed by your caregiver. Use cough suppressants only as directed by your caregiver.  Use a cold steam vaporizer or humidifier in your bedroom or home to help loosen secretions.  Sleep in a semi-upright position if your cough is worse at night.  Rest as needed.  Stop smoking if you smoke. SEEK IMMEDIATE MEDICAL CARE IF:   You have pus in your sputum.  Your cough starts to worsen.  You cannot control your cough with suppressants and are losing sleep.  You begin coughing up blood.  You have difficulty breathing.  You develop pain which is getting worse or is uncontrolled with medicine.  You have a fever. MAKE SURE YOU:   Understand these instructions.  Will watch your condition.  Will get help right away if you are not doing well or get worse. Document Released: 12/25/2010 Document Revised: 09/20/2011 Document Reviewed: 12/25/2010 ExitCare Patient Information 2015 [employer]. This information is not intended  to replace advice given to you by your health care provider. Make sure you discuss any questions you have with your health care provider.  

## 2015-03-25 NOTE — Progress Notes (Signed)
HPI  Pt presents to the clinic today with c/o cough and chest congestion. This started 2 weeks ago. The cough is nonproductive. It does seem worse at night. He denies runny nose, sore throat or ear pain. He denies fever, chills or body aches. He has taken Dayquil/Nyquil OTC with minimal relief. He has no history of allergies or breathing problems. He has not had sick contacts that he is aware of. He does not smoke.  Review of Systems      Past Medical History  Diagnosis Date  . Stroke 05/2012    R PCA territory, presented with L paresthesias  . HTN (hypertension)   . HLD (hyperlipidemia)   . History of chicken pox   . Obesity   . 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 Carotid stenosis 01/2013    bilateral, s/p L CEA, plan rpt [place] qyear    Family History  Problem Relation Age of Onset  . CAD Brother 20    MI  . Heart disease Brother     before age 65  . Heart attack Brother   . Hypertension Mother   . Cirrhosis Father 62    EtOHic  . Aneurysm Paternal Uncle 65  . Heart disease Daughter   . Hyperlipidemia Daughter     Social History   Social History  . Marital Status: Married    Spouse Name: N/A  . Number of Children: N/A  . Years of Education: N/A   Occupational History  . Not on file.   Social History Main Topics  . Smoking status: Former Smoker -- 1.50 packs/day for 33 years    Types: Cigarettes    Quit date: 06/18/1994  . Smokeless tobacco: Never Used  . Alcohol Use: 6.0 - 7.2 oz/week    10-12 Cans of beer per week     Comment: Consumes 5 beers per week  . Drug Use: No  . Sexual Activity: Yes   Other Topics Concern  . Not on file   Social History Narrative   Caffeine: light coffee in am, working on cutting back   Lives with wife, 1 dog.  Daughter next door.  Other children nearby.   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   Diet: not much water, some fruits/vegetables       Advanced directives - does not want prolonged life support if irreversible damage. Would want wife and daughters to be HCPOA. Requests info today.    No Known Allergies   Constitutional: Denies headache, fatigue, fever or abrupt weight changes.  HEENT:  Denies eye redness, eye pain, pressure behind the eyes, facial pain, nasal congestion, ear pain, ringing in the ears, wax buildup, runny nose or sore throat. Respiratory: Positive cough. Denies difficulty breathing or shortness of breath.  Cardiovascular: Denies chest pain, chest tightness, palpitations or swelling in the hands or feet.   No other specific complaints in a complete review of systems (except as listed in HPI above).  Objective:   BP 130/72 mmHg  Pulse 58  Temp(Src) 98.1 F (36.7 C) (Oral)  Wt 191 lb (86.637 kg)  SpO2 97% Wt Readings from Last 3 Encounters:  03/25/15 191 lb (86.637 kg)  02/17/15 191 lb (86.637 kg)  11/20/14 187 lb 8 oz (85.049 kg)    General: Appears his stated age, well developed, well  nourished in NAD. HEENT: Head: normal shape and size, no sinus tenderness noted; Eyes: sclera white, no icterus, conjunctiva pink; Ears: Tm's gray and intact, normal light reflex;  Throat/Mouth: Teeth present, mucosa pink and moist, no exudate noted, no lesions or ulcerations noted.  Neck: No cervical lymphadenopathy.  Cardiovascular: Normal rate and rhythm. S1,S2 noted.  No murmur, rubs or gallops noted.  Pulmonary/Chest: Normal effort and positive vesicular breath sounds. No respiratory distress. No wheezes, rales or ronchi noted.      Assessment & Plan:   Viral Upper Respiratory Infection with Cough:  Get some rest and drink plenty of water Stop Dayquil/Nyquil Start Mucinex OTC eRx for Hycodan cough syrup  RTC as needed or if symptoms persist.

## 2015-05-07 ENCOUNTER — Other Ambulatory Visit: Payer: Self-pay | Admitting: Family Medicine

## 2015-05-18 ENCOUNTER — Other Ambulatory Visit: Payer: Self-pay | Admitting: Family Medicine

## 2015-05-18 DIAGNOSIS — N289 Disorder of kidney and ureter, unspecified: Secondary | ICD-10-CM

## 2015-05-18 DIAGNOSIS — E785 Hyperlipidemia, unspecified: Secondary | ICD-10-CM

## 2015-05-18 DIAGNOSIS — I10 Essential (primary) hypertension: Secondary | ICD-10-CM

## 2015-05-20 ENCOUNTER — Other Ambulatory Visit (INDEPENDENT_AMBULATORY_CARE_PROVIDER_SITE_OTHER): Payer: Medicare Other

## 2015-05-20 DIAGNOSIS — E785 Hyperlipidemia, unspecified: Secondary | ICD-10-CM

## 2015-05-20 DIAGNOSIS — N289 Disorder of kidney and ureter, unspecified: Secondary | ICD-10-CM

## 2015-05-20 DIAGNOSIS — I10 Essential (primary) hypertension: Secondary | ICD-10-CM | POA: Diagnosis not present

## 2015-05-20 LAB — RENAL FUNCTION PANEL
Albumin: 4.3 g/dL (ref 3.5–5.2)
BUN: 29 mg/dL — AB (ref 6–23)
CALCIUM: 9.5 mg/dL (ref 8.4–10.5)
CO2: 26 meq/L (ref 19–32)
CREATININE: 1.62 mg/dL — AB (ref 0.40–1.50)
Chloride: 108 mEq/L (ref 96–112)
GFR: 44.24 mL/min — ABNORMAL LOW (ref >60.00)
GLUCOSE: 112 mg/dL — AB (ref 70–99)
Phosphorus: 3.6 mg/dL (ref 2.3–4.6)
Potassium: 4 mEq/L (ref 3.5–5.1)
Sodium: 142 mEq/L (ref 135–145)

## 2015-05-20 LAB — CBC WITH DIFFERENTIAL/PLATELET
BASOS PCT: 0.6 % (ref 0.0–3.0)
Basophils Absolute: 0 10*3/uL (ref 0.0–0.1)
EOS ABS: 0.2 10*3/uL (ref 0.0–0.7)
EOS PCT: 2.7 % (ref 0.0–5.0)
HCT: 35.2 % — ABNORMAL LOW (ref 39.0–52.0)
HEMOGLOBIN: 11.9 g/dL — AB (ref 13.0–17.0)
LYMPHS ABS: 3.7 10*3/uL (ref 0.7–4.0)
Lymphocytes Relative: 48.6 % — ABNORMAL HIGH (ref 12.0–46.0)
MCHC: 33.8 g/dL (ref 30.0–36.0)
MCV: 91.7 fl (ref 78.0–100.0)
Monocytes Absolute: 0.4 10*3/uL (ref 0.1–1.0)
Monocytes Relative: 4.9 % (ref 3.0–12.0)
Neutro Abs: 3.3 10*3/uL (ref 1.4–7.7)
Neutrophils Relative %: 43.2 % (ref 43.0–77.0)
PLATELETS: 247 10*3/uL (ref 150.0–400.0)
RBC: 3.84 Mil/uL — ABNORMAL LOW (ref 4.22–5.81)
RDW: 13.6 % (ref 11.5–15.5)
WBC: 7.7 10*3/uL (ref 4.0–10.5)

## 2015-05-20 LAB — LIPID PANEL
CHOL/HDL RATIO: 3
Cholesterol: 123 mg/dL (ref 0–200)
HDL: 40.8 mg/dL (ref >39.00)
LDL CALC: 71 mg/dL (ref 0–99)
NonHDL: 82.6
Triglycerides: 59 mg/dL (ref 0.0–149.0)
VLDL: 11.8 mg/dL (ref 0.0–40.0)

## 2015-05-23 ENCOUNTER — Other Ambulatory Visit: Payer: Self-pay

## 2015-05-23 ENCOUNTER — Ambulatory Visit (INDEPENDENT_AMBULATORY_CARE_PROVIDER_SITE_OTHER): Payer: Medicare Other | Admitting: Family Medicine

## 2015-05-23 ENCOUNTER — Encounter: Payer: Self-pay | Admitting: Family Medicine

## 2015-05-23 VITALS — BP 160/70 | HR 63 | Temp 97.9°F | Ht 64.0 in | Wt 195.0 lb

## 2015-05-23 DIAGNOSIS — I6523 Occlusion and stenosis of bilateral carotid arteries: Secondary | ICD-10-CM

## 2015-05-23 DIAGNOSIS — E785 Hyperlipidemia, unspecified: Secondary | ICD-10-CM

## 2015-05-23 DIAGNOSIS — I10 Essential (primary) hypertension: Secondary | ICD-10-CM | POA: Diagnosis not present

## 2015-05-23 DIAGNOSIS — N289 Disorder of kidney and ureter, unspecified: Secondary | ICD-10-CM | POA: Diagnosis not present

## 2015-05-23 DIAGNOSIS — E669 Obesity, unspecified: Secondary | ICD-10-CM

## 2015-05-23 DIAGNOSIS — Z0000 Encounter for general adult medical examination without abnormal findings: Secondary | ICD-10-CM | POA: Diagnosis not present

## 2015-05-23 DIAGNOSIS — Z1211 Encounter for screening for malignant neoplasm of colon: Secondary | ICD-10-CM

## 2015-05-23 DIAGNOSIS — E7800 Pure hypercholesterolemia, unspecified: Secondary | ICD-10-CM

## 2015-05-23 DIAGNOSIS — Z7189 Other specified counseling: Secondary | ICD-10-CM

## 2015-05-23 DIAGNOSIS — E66811 Obesity, class 1: Secondary | ICD-10-CM

## 2015-05-23 NOTE — Assessment & Plan Note (Signed)
Advanced directives - does not want prolonged life support if irreversible damage. Would want wife and daughters to be HCPOA. Requests packet today.

## 2015-05-23 NOTE — Patient Instructions (Addendum)
Pass by Craig Avery's office to schedule follow up with GI for colonoscopy. Think about pneumonia and flu shots.  Advanced directive packet provided today. Blood pressure staying too high - start monitoring at home and bring me log of blood pressures in 2 weeks to review.  Try exercises for rotator cuff provided today. Return as needed or in 6 months for follow up.

## 2015-05-23 NOTE — Assessment & Plan Note (Signed)
Great control on current regimen. Continue.

## 2015-05-23 NOTE — Assessment & Plan Note (Signed)
Reviewed with patient. Encouraged increased water. Will need to get better bp control - see above.

## 2015-05-23 NOTE — Assessment & Plan Note (Signed)

## 2015-05-23 NOTE — Assessment & Plan Note (Signed)
Encouraged healthy diet and lifestyle changes to affect sustainable weight loss.  

## 2015-05-23 NOTE — Progress Notes (Signed)
BP 160/70 mmHg  Pulse 63  Temp(Src) 97.9 F (36.6 C) (Oral)  Ht 5\' 4"  (1.626 m)  Wt 195 lb (88.451 kg)  BMI 33.46 kg/m2  SpO2 94%   CC: medicare wellness visit  Subjective:    Patient ID: Craig Avery, male    DOB: 09/28/1940, 74 y.o.   MRN: [id number]  HPI: Craig Avery is a 74 y.o. male presenting on 05/23/2015 for Medicare Wellness   Wife currently in hospital with pneumonia. Stressed over this. Some intermittent R shoulder pain noticed worse when trying to put arm in shirt.  Passes hearing and vision screens today Denies depression/anhedonia/sadness, no falls in last year.  Preventative: Colon cancer screening - iFOB positive last year - referred to [facility], has prep at home pending colonoscopy but pt states never received actual call to schedule.  Prostate cancer screening - discussed, would like to defer screening Scared of needles  Flu shot - declines  Pneumovax - declines  Tdap 11/2014 zostavax - declines Advanced directives - does not want prolonged life support if irreversible damage. Would want wife and daughters to be HCPOA. Requests packet today.  Caffeine: light coffee in am, working on cutting back Lives with wife, 1 dog. Daughter next door. Other children nearby. 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 Diet: not much water, some fruits/vegetables   Relevant past medical, surgical, family and social history reviewed and updated as indicated. Interim medical history since our last visit reviewed. Allergies and medications reviewed and updated. Current Outpatient Prescriptions on File Prior to Visit  Medication Sig  . amLODipine (NORVASC) 10 MG tablet TAKE 1 TABLET BY MOUTH EVERY DAY  . aspirin 81 MG tablet Take 81 mg by mouth daily.  Marland Kitchen atorvastatin (LIPITOR) 20 MG tablet TAKE 1 TABLET BY MOUTH DAILY AT 6PM  . clopidogrel (PLAVIX) 75 MG tablet TAKE 1  TABLET BY MOUTH ONCE A DAY  . Cyanocobalamin (VITAMIN B 12 PO) Take by mouth daily.  Marland Kitchen docusate sodium (COLACE) 100 MG capsule Take 100 mg by mouth daily as needed for mild constipation.  Marland Kitchen HYDROcodone-homatropine (HYCODAN) 5-1.5 MG/5ML syrup Take 5 mLs by mouth every 8 (eight) hours as needed for cough.  Marland Kitchen lisinopril-hydrochlorothiazide (ZESTORETIC) 20-12.5 MG per tablet Take 1 tablet by mouth 2 (two) times daily.  . polyethylene glycol powder (GLYCOLAX/MIRALAX) powder Take 17 g by mouth daily as needed for moderate constipation.   No current facility-administered medications on file prior to visit.    Review of Systems Per HPI unless specifically indicated in ROS section     Objective:    BP 160/70 mmHg  Pulse 63  Temp(Src) 97.9 F (36.6 C) (Oral)  Ht 5\' 4"  (1.626 m)  Wt 195 lb (88.451 kg)  BMI 33.46 kg/m2  SpO2 94%  Wt Readings from Last 3 Encounters:  05/23/15 195 lb (88.451 kg)  03/25/15 191 lb (86.637 kg)  02/17/15 191 lb (86.637 kg)   Body mass index is 33.46 kg/(m^2).  Physical Exam  Constitutional: He is oriented to person, place, and time. He appears well-developed and well-nourished. No distress.  HENT:  Head: Normocephalic and atraumatic.  Right Ear: Hearing, tympanic membrane, external ear and ear canal normal.  Left Ear: Hearing, tympanic membrane, external ear and ear canal normal.  Nose: Nose normal.  Mouth/Throat: Uvula is midline, oropharynx is clear and moist and mucous membranes are normal. No oropharyngeal exudate, posterior oropharyngeal edema or  posterior oropharyngeal erythema.  Eyes: Conjunctivae and EOM are normal. Pupils are equal, round, and reactive to light. No scleral icterus.  Neck: Normal range of motion. Neck supple. Carotid bruit is present (R>L). No thyromegaly present.  Cardiovascular: Normal rate, regular rhythm, normal heart sounds and intact distal pulses.   No murmur heard. Pulses:      Radial pulses are 2+ on the right side, and 2+  on the left side.  Pulmonary/Chest: Effort normal and breath sounds normal. No respiratory distress. He has no wheezes. He has no rales.  Abdominal: Soft. Bowel sounds are normal. He exhibits no distension and no mass. There is no tenderness. There is no rebound and no guarding.  Musculoskeletal: Normal range of motion. He exhibits no edema.  Lymphadenopathy:    He has no cervical adenopathy.  Neurological: He is alert and oriented to person, place, and time.  CN grossly intact, station and gait intact Recall 3/3 Calculation 5/5 serial 3s  Skin: Skin is warm and dry. No rash noted.  Psychiatric: He has a normal mood and affect. His behavior is normal. Judgment and thought content normal.  Nursing note and vitals reviewed.  Results for orders placed or performed in visit on 05/20/15  Lipid panel  Result Value Ref Range   Cholesterol 123 0 - 200 mg/dL   Triglycerides 59.0 0.0 - 149.0 mg/dL   HDL 40.80 >39.00 mg/dL   VLDL 11.8 0.0 - 40.0 mg/dL   LDL Cholesterol 71 0 - 99 mg/dL   Total CHOL/HDL Ratio 3    NonHDL 82.60   Renal function panel  Result Value Ref Range   Sodium 142 135 - 145 mEq/L   Potassium 4.0 3.5 - 5.1 mEq/L   Chloride 108 96 - 112 mEq/L   CO2 26 19 - 32 mEq/L   Calcium 9.5 8.4 - 10.5 mg/dL   Albumin 4.3 3.5 - 5.2 g/dL   BUN 29 (H) 6 - 23 mg/dL   Creatinine, Ser 1.62 (H) 0.40 - 1.50 mg/dL   Glucose, Bld 112 (H) 70 - 99 mg/dL   Phosphorus 3.6 2.3 - 4.6 mg/dL   GFR 44.24 (L) >60.00 mL/min  CBC with Differential/Platelet  Result Value Ref Range   WBC 7.7 4.0 - 10.5 K/uL   RBC 3.84 (L) 4.22 - 5.81 Mil/uL   Hemoglobin 11.9 (L) 13.0 - 17.0 g/dL   HCT 35.2 (L) 39.0 - 52.0 %   MCV 91.7 78.0 - 100.0 fl   MCHC 33.8 30.0 - 36.0 g/dL   RDW 13.6 11.5 - 15.5 %   Platelets 247.0 150.0 - 400.0 K/uL   Neutrophils Relative % 43.2 43.0 - 77.0 %   Lymphocytes Relative 48.6 (H) 12.0 - 46.0 %   Monocytes Relative 4.9 3.0 - 12.0 %   Eosinophils Relative 2.7 0.0 - 5.0 %    Basophils Relative 0.6 0.0 - 3.0 %   Neutro Abs 3.3 1.4 - 7.7 K/uL   Lymphs Abs 3.7 0.7 - 4.0 K/uL   Monocytes Absolute 0.4 0.1 - 1.0 K/uL   Eosinophils Absolute 0.2 0.0 - 0.7 K/uL   Basophils Absolute 0.0 0.0 - 0.1 K/uL      Assessment & Plan:   Problem List Items Addressed This Visit    Renal insufficiency    Reviewed with patient. Encouraged increased water. Will need to get better bp control - see above.      Obesity, Class I, BMI 30-34.9    Encouraged healthy diet and lifestyle changes to  affect sustainable weight loss.      Medicare annual wellness visit, subsequent - Primary    I have personally reviewed the Medicare Annual Wellness questionnaire and have noted 1. The patient's medical and social history 2. Their use of alcohol, tobacco or illicit drugs 3. Their current medications and supplements 4. The patient's functional ability including ADL's, fall risks, home safety risks and hearing or visual impairment. Cognitive function has been assessed and addressed as indicated.  5. Diet and physical activity 6. Evidence for depression or mood disorders The patients weight, height, BMI have been recorded in the chart. I have made referrals, counseling and provided education to the patient based on review of the above and I have provided the pt with a written personalized care plan for preventive services. Provider list updated.. See scanned questionairre as needed for further documentation. Reviewed preventative protocols and updated unless pt declined.       Hyperlipidemia    Great control on current regimen. Continue.      HTN (hypertension) (Chronic)    bp elevated today. Increased stress noted. Also missed a few doses this past week. Advised to start monitoring bp at home and bring me log in 2 wks to review.      Carotid stenosis, bilateral    S/p L CEA. Continue yearly [place]      Advanced care planning/counseling discussion    Advanced directives - does not want  prolonged life support if irreversible damage. Would want wife and daughters to be HCPOA. Requests packet today.       Other Visit Diagnoses    Special screening for malignant neoplasms, colon        Relevant Orders    Ambulatory referral to Gastroenterology        Follow up plan: Return in about 6 months (around 11/20/2015), or as needed, for follow up visit.

## 2015-05-23 NOTE — Assessment & Plan Note (Signed)
bp elevated today. Increased stress noted. Also missed a few doses this past week. Advised to start monitoring bp at home and bring me log in 2 wks to review.

## 2015-05-23 NOTE — Assessment & Plan Note (Signed)
S/p L CEA. Continue yearly [place]

## 2015-06-13 ENCOUNTER — Telehealth: Payer: Self-pay

## 2015-06-13 NOTE — Telephone Encounter (Signed)
Received cardiac clearance request for pt to proceed w/ colonoscopy on 06/24/15 w/ Dr. Allen Norris @ [facility]. Per Dr. Rockey Situ, pt is cleared to proceed and my hold Plavix 5 days prior and resume 2 days after procedure.  Faxed to [phone].

## 2015-06-17 ENCOUNTER — Telehealth: Payer: Self-pay

## 2015-06-17 NOTE — Telephone Encounter (Signed)
Pt has been advised per Dr. Rockey Situ, to stop his Plavix 5 days prior to colonoscopy and restart 2 days after.

## 2015-06-24 ENCOUNTER — Ambulatory Visit: Payer: Medicare Other | Admitting: Anesthesiology

## 2015-06-24 ENCOUNTER — Encounter
Admission: RE | Disposition: A | Discharge status: Home / Self Care | Payer: Self-pay | Source: Ambulatory Visit | Attending: Gastroenterology

## 2015-06-24 ENCOUNTER — Encounter: Payer: Self-pay | Admitting: *Deleted

## 2015-06-24 ENCOUNTER — Ambulatory Visit
Admission: RE | Admit: 2015-06-24 | Discharge: 2015-06-24 | Disposition: A | Discharge status: Home / Self Care | Payer: Medicare Other | Source: Ambulatory Visit | Attending: Gastroenterology | Admitting: Gastroenterology

## 2015-06-24 DIAGNOSIS — Z7982 Long term (current) use of aspirin: Secondary | ICD-10-CM | POA: Insufficient documentation

## 2015-06-24 DIAGNOSIS — Z8673 Personal history of transient ischemic attack (TIA), and cerebral infarction without residual deficits: Secondary | ICD-10-CM | POA: Diagnosis not present

## 2015-06-24 DIAGNOSIS — I6529 Occlusion and stenosis of unspecified carotid artery: Secondary | ICD-10-CM | POA: Diagnosis not present

## 2015-06-24 DIAGNOSIS — K635 Polyp of colon: Secondary | ICD-10-CM | POA: Diagnosis not present

## 2015-06-24 DIAGNOSIS — I10 Essential (primary) hypertension: Secondary | ICD-10-CM | POA: Insufficient documentation

## 2015-06-24 DIAGNOSIS — Z79899 Other long term (current) drug therapy: Secondary | ICD-10-CM | POA: Insufficient documentation

## 2015-06-24 DIAGNOSIS — D125 Benign neoplasm of sigmoid colon: Secondary | ICD-10-CM | POA: Insufficient documentation

## 2015-06-24 DIAGNOSIS — E785 Hyperlipidemia, unspecified: Secondary | ICD-10-CM | POA: Insufficient documentation

## 2015-06-24 DIAGNOSIS — K641 Second degree hemorrhoids: Secondary | ICD-10-CM | POA: Diagnosis not present

## 2015-06-24 DIAGNOSIS — D122 Benign neoplasm of ascending colon: Secondary | ICD-10-CM | POA: Insufficient documentation

## 2015-06-24 DIAGNOSIS — D124 Benign neoplasm of descending colon: Secondary | ICD-10-CM | POA: Diagnosis not present

## 2015-06-24 DIAGNOSIS — Z87891 Personal history of nicotine dependence: Secondary | ICD-10-CM | POA: Insufficient documentation

## 2015-06-24 DIAGNOSIS — Z8249 Family history of ischemic heart disease and other diseases of the circulatory system: Secondary | ICD-10-CM | POA: Diagnosis not present

## 2015-06-24 DIAGNOSIS — E669 Obesity, unspecified: Secondary | ICD-10-CM | POA: Diagnosis not present

## 2015-06-24 DIAGNOSIS — I739 Peripheral vascular disease, unspecified: Secondary | ICD-10-CM | POA: Diagnosis not present

## 2015-06-24 DIAGNOSIS — Z1211 Encounter for screening for malignant neoplasm of colon: Secondary | ICD-10-CM | POA: Diagnosis not present

## 2015-06-24 HISTORY — PX: COLONOSCOPY WITH PROPOFOL: SHX5780

## 2015-06-24 SURGERY — COLONOSCOPY WITH PROPOFOL
Anesthesia: General

## 2015-06-24 MED ORDER — PROPOFOL 10 MG/ML IV BOLUS
INTRAVENOUS | Status: DC | PRN
  Administered 2015-06-24: 50 mg via INTRAVENOUS

## 2015-06-24 MED ORDER — SODIUM CHLORIDE 0.9 % IV SOLN
INTRAVENOUS | Status: DC
  Administered 2015-06-24: 1000 mL via INTRAVENOUS

## 2015-06-24 MED ORDER — PROPOFOL 500 MG/50ML IV EMUL
INTRAVENOUS | Status: DC | PRN
  Administered 2015-06-24: 150 ug/kg/min via INTRAVENOUS

## 2015-06-24 MED ORDER — LIDOCAINE HCL (CARDIAC) 20 MG/ML IV SOLN
INTRAVENOUS | Status: DC | PRN
  Administered 2015-06-24: 60 mg via INTRAVENOUS

## 2015-06-24 NOTE — Anesthesia Postprocedure Evaluation (Signed)
Anesthesia Post Note  Patient: Craig Avery [facility]: Lambert Rhonda W  Procedure(s) Performed: Procedure(s) (LRB): COLONOSCOPY WITH PROPOFOL (N/A)  Patient location during evaluation: Endoscopy Anesthesia Type: General Level of consciousness: awake Pain management: pain level controlled Respiratory status: spontaneous breathing Cardiovascular status: blood pressure returned to baseline    Last Vitals:  Filed Vitals:   06/24/15 0903 06/24/15 0913  BP: 149/58 148/59  Pulse: 52 59  Temp:    Resp: 14 12    Last Pain:  Filed Vitals:   06/24/15 0917  PainSc: 0-No pain                 Rondia Higginbotham S

## 2015-06-24 NOTE — H&P (Signed)
[facility]  [address], [place], [place] [address] Phone: [phone] Fax : [phone]  Primary Care Physician:  [name], MD Primary Gastroenterologist:  Dr. Allen Norris  Pre-Procedure History & Physical: HPI:  Craig Avery is a 74 y.o. male is here for a screening colonoscopy.   Past Medical History  Diagnosis Date  . Stroke ([place]) 05/2012    R PCA territory, presented with L paresthesias  . HTN (hypertension)   . HLD (hyperlipidemia)   . History of chicken pox   . Obesity   . 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 Carotid stenosis 01/2013    bilateral, s/p L CEA, plan rpt [place] qyear    Past Surgical History  Procedure Laterality Date  . Tee without cardioversion  05/22/2012    Procedure: TRANSESOPHAGEAL ECHOCARDIOGRAM (TEE);  Surgeon: [name], MD;  Location: [facility] ENDOSCOPY;  Service: Cardiovascular;  Laterality: N/A;  . Cataract extraction  2004    bilateral  . Wisdom tooth extraction    . Endarterectomy  07/21/2012    Left CEA; Surgeon: [name], MD  . Patch angioplasty  07/21/2012    Procedure: PATCH ANGIOPLASTY;  Surgeon: [name], MD;  [facility];  Service: Vascular;  Laterality: Left;  Using 1 cm x 6 cm Vascu Guard patch   . Carotid endarterectomy Left 07-21-12    cea    Prior to Admission medications   Medication Sig Start Date End Date Taking? Authorizing Provider  amLODipine (NORVASC) 10 MG tablet TAKE 1 TABLET BY MOUTH EVERY DAY 05/07/15  Yes [name], MD  aspirin 81 MG tablet Take 81 mg by mouth daily.   Yes Historical Provider, MD  atorvastatin (LIPITOR) 20 MG tablet TAKE 1 TABLET BY MOUTH DAILY AT 6PM 02/10/15  Yes Minna Merritts, MD  clopidogrel (PLAVIX) 75 MG tablet TAKE 1 TABLET BY MOUTH ONCE A DAY 01/17/15  Yes [name], MD  Cyanocobalamin (VITAMIN B 12 PO) Take by mouth daily.   Yes Historical Provider, MD  docusate  sodium (COLACE) 100 MG capsule Take 100 mg by mouth daily as needed for mild constipation.   Yes Historical Provider, MD  HYDROcodone-homatropine (HYCODAN) 5-1.5 MG/5ML syrup Take 5 mLs by mouth every 8 (eight) hours as needed for cough. 03/25/15  Yes Jearld Fenton, NP  lisinopril-hydrochlorothiazide (ZESTORETIC) 20-12.5 MG per tablet Take 1 tablet by mouth 2 (two) times daily. 10/23/14  Yes Minna Merritts, MD  Multiple Vitamins-Minerals (CENTRUM SILVER ADULT 50+ PO) Take 1 tablet by mouth daily.   Yes Historical Provider, MD  polyethylene glycol powder (GLYCOLAX/MIRALAX) powder Take 17 g by mouth daily as needed for moderate constipation. 11/20/14  Yes [name], MD    Allergies as of 05/23/2015  . (No Known Allergies)    Family History  Problem Relation Age of Onset  . CAD Brother 39    MI  . Heart disease Brother     before age 12  . Heart attack Brother   . Hypertension Mother   . Cirrhosis Father 80    EtOHic  . Aneurysm Paternal Uncle 65  . Heart disease Daughter   . Hyperlipidemia Daughter     Social History   Social History  . Marital Status: Married    Spouse Name: N/A  . Number of Children: N/A  . Years of Education: N/A   Occupational History  . Not  on file.   Social History Main Topics  . Smoking status: Former Smoker -- 1.50 packs/day for 33 years    Types: Cigarettes    Quit date: 06/18/1994  . Smokeless tobacco: Never Used  . Alcohol Use: 6.0 - 7.2 oz/week    10-12 Cans of beer per week     Comment: Consumes 5 beers per week  . Drug Use: No  . Sexual Activity: Yes   Other Topics Concern  . Not on file   Social History Narrative   Caffeine: light coffee in am, working on cutting back   Lives with wife, 1 dog.  Daughter next door.  Other children nearby.   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   Diet: not much water, some fruits/vegetables        Advanced directives - does not want prolonged life support if irreversible damage. Would want wife and daughters to be HCPOA. Requests info today.    Review of Systems: See HPI, otherwise negative ROS  Physical Exam: BP 151/73 mmHg  Pulse 62  Temp(Src) 97.8 F (36.6 C) (Tympanic)  Resp 20  Ht 5\' 4"  (1.626 m)  Wt 190 lb (86.183 kg)  BMI 32.60 kg/m2  SpO2 97% General:   Alert,  pleasant and cooperative in NAD Head:  Normocephalic and atraumatic. Neck:  Supple; no masses or thyromegaly. Lungs:  Clear throughout to auscultation.    Heart:  Regular rate and rhythm. Abdomen:  Soft, nontender and nondistended. Normal bowel sounds, without guarding, and without rebound.   Neurologic:  Alert and  oriented x4;  grossly normal neurologically.  Impression/Plan: Craig Avery is now here to undergo a screening colonoscopy.  Risks, benefits, and alternatives regarding colonoscopy have been reviewed with the patient.  Questions have been answered.  All parties agreeable.

## 2015-06-24 NOTE — Op Note (Signed)
[facility] Patient Name: Craig Avery Procedure Date: 06/24/2015 8:05 AM MRN: [id number] Account #: [id number] Date of Birth: Jan 30, 1941 Admit Type: Outpatient Age: 74 Room: [facility] ENDO ROOM 4 Gender: Male Note Status: Finalized Procedure:         Colonoscopy Indications:       Screening for colorectal malignant neoplasm Providers:         [name], MD Referring MD:      [name] (Referring MD) Medicines:         Propofol per Anesthesia Complications:     No immediate complications. Procedure:         Pre-Anesthesia Assessment:                    - Prior to the procedure, a History and Physical was                     performed, and patient medications and allergies were                     reviewed. The patient's tolerance of previous anesthesia                     was also reviewed. The risks and benefits of the procedure                     and the sedation options and risks were discussed with the                     patient. All questions were answered, and informed consent                     was obtained. Prior Anticoagulants: The patient has taken                     no previous anticoagulant or antiplatelet agents. ASA                     Grade Assessment: II - A patient with mild systemic                     disease. After reviewing the risks and benefits, the                     patient was deemed in satisfactory condition to undergo                     the procedure.                    After obtaining informed consent, the colonoscope was                     passed under direct vision. Throughout the procedure, the                     patient's blood pressure, pulse, and oxygen saturations                     were monitored continuously. The Colonoscope was                     introduced through the anus and advanced to the the cecum,  identified by appendiceal orifice and ileocecal valve. The             colonoscopy was performed without difficulty. The patient                     tolerated the procedure well. The quality of the bowel                     preparation was excellent. Findings:      The perianal and digital rectal examinations were normal.      A 10 mm polyp was found in the ascending colon. The polyp was sessile.       The polyp was removed with a hot snare. Resection and retrieval were       complete.      A 4 mm polyp was found in the descending colon. The polyp was sessile.       The polyp was removed with a cold biopsy forceps. Resection and       retrieval were complete.      A 9 mm polyp was found in the sigmoid colon. The polyp was pedunculated.       The polyp was removed with a hot snare. Resection and retrieval were       complete.      Non-bleeding internal hemorrhoids were found during retroflexion. The       hemorrhoids were Grade II (internal hemorrhoids that prolapse but reduce       spontaneously). Impression:        - One 10 mm polyp in the ascending colon. Resected and                     retrieved.                    - One 4 mm polyp in the descending colon. Resected and                     retrieved.                    - One 9 mm polyp in the sigmoid colon. Resected and                     retrieved.                    - Non-bleeding internal hemorrhoids. Recommendation:    - Await pathology results. Procedure Code(s): --- Professional ---                    [phone], Colonoscopy, flexible; with removal of tumor(s),                     polyp(s), or other lesion(s) by snare technique                    [id number], 65, Colonoscopy, flexible; with biopsy, single or                     multiple Diagnosis Code(s): --- Professional ---                    Z12.11, Encounter for screening for malignant neoplasm of                     colon  D12.2, Benign neoplasm of ascending colon                    D12.4, Benign neoplasm of descending  colon                    D12.5, Benign neoplasm of sigmoid colon CPT copyright 2014 American Medical Association. All rights reserved. The codes documented in this report are preliminary and upon coder review may  be revised to meet current compliance requirements. [name], MD 06/24/2015 8:29:12 AM This report has been signed electronically. Number of Addenda: 0 Note Initiated On: 06/24/2015 8:05 AM Scope Withdrawal Time: 0 hours 6 minutes 35 seconds  Total Procedure Duration: 0 hours 16 minutes 46 seconds       [facility]

## 2015-06-24 NOTE — Transfer of Care (Signed)
Immediate Anesthesia Transfer of Care Note  Patient: Craig Avery [facility]  Procedure(s) Performed: Procedure(s): COLONOSCOPY WITH PROPOFOL (N/A)  Patient Location: PACU  Anesthesia Type:MAC  Level of Consciousness: sedated  Airway & Oxygen Therapy: Patient Spontanous Breathing  Post-op Assessment: Report given to RN  Post vital signs: stable  Last Vitals:  Filed Vitals:   06/24/15 0735  BP: 151/73  Pulse: 62  Temp: 36.6 C  Resp: 20    Complications: No apparent anesthesia complications

## 2015-06-24 NOTE — Anesthesia Preprocedure Evaluation (Signed)
Anesthesia Evaluation  Patient identified by MRN, date of birth, ID band Patient awake    Reviewed: Allergy & Precautions, NPO status , Patient's Chart, lab work & pertinent test results, reviewed documented beta blocker date and time   Airway Mallampati: II  TM Distance: >3 FB     Dental  (+) Chipped   Pulmonary former smoker,           Cardiovascular hypertension, Pt. on medications + Peripheral Vascular Disease       Neuro/Psych TIACVA, No Residual Symptoms    GI/Hepatic   Endo/Other    Renal/GU Renal InsufficiencyRenal disease     Musculoskeletal   Abdominal   Peds  Hematology   Anesthesia Other Findings   Reproductive/Obstetrics                             Anesthesia Physical Anesthesia Plan  ASA: III  Anesthesia Plan: General   Post-op Pain Management:    Induction: Intravenous  Airway Management Planned:   Additional Equipment:   Intra-op Plan:   Post-operative Plan:   Informed Consent: I have reviewed the patients History and Physical, chart, labs and discussed the procedure including the risks, benefits and alternatives for the proposed anesthesia with the patient or authorized representative who has indicated his/her understanding and acceptance.     Plan Discussed with: CRNA  Anesthesia Plan Comments:         Anesthesia Quick Evaluation

## 2015-06-25 ENCOUNTER — Encounter: Payer: Self-pay | Admitting: Gastroenterology

## 2015-06-26 LAB — SURGICAL PATHOLOGY

## 2015-06-30 NOTE — Addendum Note (Signed)
Addendum  created 06/30/15 [id number] by Gunnar Bulla, MD   Modules edited: Anesthesia Attestations

## 2015-07-01 ENCOUNTER — Telehealth: Payer: Self-pay

## 2015-07-01 NOTE — Telephone Encounter (Signed)
LVM for pt to return my call to schedule a follow up colonoscopy results.

## 2015-07-01 NOTE — Progress Notes (Signed)
LVM for pt to return my call. Pt needs to schedule a follow up appt to discuss colonoscopy results.

## 2015-07-01 NOTE — Telephone Encounter (Signed)
-----   Message from [name], MD sent at 07/01/2015  6:52 AM EST ----- Office visit to review pathology

## 2015-07-03 ENCOUNTER — Other Ambulatory Visit: Payer: Self-pay | Admitting: Family Medicine

## 2015-07-09 NOTE — Telephone Encounter (Signed)
Pt has been scheduled for a follow up appt on 07/31/15 to discuss colonoscopy results.

## 2015-07-10 ENCOUNTER — Encounter: Payer: Self-pay | Admitting: *Deleted

## 2015-07-31 ENCOUNTER — Ambulatory Visit (INDEPENDENT_AMBULATORY_CARE_PROVIDER_SITE_OTHER): Payer: Medicare Other | Admitting: Gastroenterology

## 2015-07-31 ENCOUNTER — Other Ambulatory Visit: Payer: Self-pay

## 2015-07-31 ENCOUNTER — Encounter: Payer: Self-pay | Admitting: Gastroenterology

## 2015-07-31 ENCOUNTER — Ambulatory Visit: Payer: Medicare Other | Admitting: Gastroenterology

## 2015-07-31 VITALS — BP 159/68 | HR 62 | Temp 98.7°F | Ht 64.0 in | Wt 197.0 lb

## 2015-07-31 DIAGNOSIS — Z86010 Personal history of colonic polyps: Secondary | ICD-10-CM

## 2015-07-31 NOTE — Progress Notes (Signed)
Primary Care Physician: [name], MD  Primary Gastroenterologist:  Dr. [name]  Chief Complaint  Patient presents with  . Follow up colonoscopy    HPI: Craig Avery is a 75 y.o. male here follow-up after a colonoscopy. 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 The high-grade dysplasia lesion was thought by the pathologist to be completely removed but reevaluation of the area was suggested. Has had no other problems since the colonoscopy and states he has been doing well.  Current Outpatient Prescriptions  Medication Sig Dispense Refill  . amLODipine (NORVASC) 10 MG tablet TAKE 1 TABLET BY MOUTH EVERY DAY 90 tablet 1  . aspirin 81 MG tablet Take 81 mg by mouth daily.    Marland Kitchen atorvastatin (LIPITOR) 20 MG tablet TAKE 1 TABLET BY MOUTH DAILY AT 6PM 90 tablet 3  . clopidogrel (PLAVIX) 75 MG tablet TAKE 1 TABLET BY MOUTH ONCE A DAY 90 tablet 3  . Cyanocobalamin (VITAMIN B 12 PO) Take by mouth daily.    Marland Kitchen lisinopril-hydrochlorothiazide (ZESTORETIC) 20-12.5 MG per tablet Take 1 tablet by mouth 2 (two) times daily. 180 tablet 3  . Multiple Vitamins-Minerals (CENTRUM SILVER ADULT 50+ PO) Take 1 tablet by mouth daily.    Marland Kitchen docusate sodium (COLACE) 100 MG capsule Take 100 mg by mouth daily as needed for mild constipation. Reported on 07/31/2015    . HYDROcodone-homatropine (HYCODAN) 5-1.5 MG/5ML syrup Take 5 mLs by mouth every 8 (eight) hours as needed for cough. (Patient not taking: Reported on 07/31/2015) 120 mL 0  . polyethylene glycol powder (GLYCOLAX/MIRALAX) powder Take 17 g by mouth daily as needed for moderate constipation. (Patient not taking: Reported on 07/31/2015) 850 g 1   No current facility-administered medications for this visit.    Allergies as of 07/31/2015  . (No Known Allergies)    ROS:  General:  Negative for anorexia, weight loss, fever, chills, fatigue, weakness. ENT: Negative for hoarseness, difficulty swallowing , nasal congestion. CV: Negative for chest pain, angina, palpitations, dyspnea on exertion, peripheral edema.  Respiratory: Negative for dyspnea at rest, dyspnea on exertion, cough, sputum, wheezing.  GI: See history of present illness. GU:  Negative for dysuria, hematuria, urinary incontinence, urinary frequency, nocturnal urination.  Endo: Negative for unusual weight change.    Physical Examination:   BP 159/68 mmHg  Pulse 62  Temp(Src) 98.7 F (37.1 C) (Oral)  Ht 5\' 4"  (1.626 m)  Wt 197 lb (89.359 kg)  BMI 33.80 kg/m2  General: Well-nourished, well-developed in no acute distress.  Eyes: No icterus. Conjunctivae pink. Neuro: Alert and oriented x 3.  Grossly intact. Skin: Warm and dry, no jaundice.   Psych: Alert and cooperative, normal mood and affect.  Labs:    Imaging Studies: No results found.  Assessment and Plan:   Craig Avery is a 75 y.o. y/o male comes in after having a colonoscopy with a polyp showing high-grade dysplasia with pathology thinking it was complete removal. 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 The patient will be set up for repeat colonoscopy in 3 months to evaluate the colon for any remaining tissue. The patient has been explained the plan and agrees with it.I have discussed risks & benefits which include, but are not limited to, bleeding, infection, perforation & drug reaction.  The  patient agrees with this plan & written consent will be obtained.      Note: This dictation was prepared with Dragon dictation along with smaller phrase technology. Any transcriptional errors that result from this process are unintentional.

## 2015-08-01 ENCOUNTER — Other Ambulatory Visit: Payer: Self-pay

## 2015-09-23 ENCOUNTER — Telehealth: Payer: Self-pay

## 2015-09-23 NOTE — Telephone Encounter (Signed)
Received cardiac clearance request for pt to proceed w/ repeat colonoscopy on 10/14/15. Per Dr. Rockey Situ, pt is cleared and may hold Plavix 5 days prior to procedure and restart 1 day after. Faxed to [facility] @ [phone], attn Ginger Feldpausch, CMA.

## 2015-10-02 ENCOUNTER — Other Ambulatory Visit: Payer: Self-pay

## 2015-10-13 ENCOUNTER — Encounter: Payer: Self-pay | Admitting: *Deleted

## 2015-10-14 ENCOUNTER — Ambulatory Visit: Admit: 2015-10-14 | Payer: Self-pay | Admitting: Gastroenterology

## 2015-10-14 ENCOUNTER — Ambulatory Visit: Payer: Medicare Other | Admitting: Anesthesiology

## 2015-10-14 ENCOUNTER — Encounter
Admission: RE | Disposition: A | Discharge status: Home / Self Care | Payer: Self-pay | Source: Ambulatory Visit | Attending: Gastroenterology

## 2015-10-14 ENCOUNTER — Ambulatory Visit
Admission: RE | Admit: 2015-10-14 | Discharge: 2015-10-14 | Disposition: A | Discharge status: Home / Self Care | Payer: Medicare Other | Source: Ambulatory Visit | Attending: Gastroenterology | Admitting: Gastroenterology

## 2015-10-14 DIAGNOSIS — Z8349 Family history of other endocrine, nutritional and metabolic diseases: Secondary | ICD-10-CM | POA: Diagnosis not present

## 2015-10-14 DIAGNOSIS — E785 Hyperlipidemia, unspecified: Secondary | ICD-10-CM | POA: Diagnosis not present

## 2015-10-14 DIAGNOSIS — K635 Polyp of colon: Secondary | ICD-10-CM | POA: Insufficient documentation

## 2015-10-14 DIAGNOSIS — Z7902 Long term (current) use of antithrombotics/antiplatelets: Secondary | ICD-10-CM | POA: Insufficient documentation

## 2015-10-14 DIAGNOSIS — Z860100 Personal history of colon polyps, unspecified: Secondary | ICD-10-CM | POA: Insufficient documentation

## 2015-10-14 DIAGNOSIS — Z79899 Other long term (current) drug therapy: Secondary | ICD-10-CM | POA: Insufficient documentation

## 2015-10-14 DIAGNOSIS — D125 Benign neoplasm of sigmoid colon: Secondary | ICD-10-CM

## 2015-10-14 DIAGNOSIS — I10 Essential (primary) hypertension: Secondary | ICD-10-CM | POA: Insufficient documentation

## 2015-10-14 DIAGNOSIS — E669 Obesity, unspecified: Secondary | ICD-10-CM | POA: Diagnosis not present

## 2015-10-14 DIAGNOSIS — K641 Second degree hemorrhoids: Secondary | ICD-10-CM | POA: Diagnosis not present

## 2015-10-14 DIAGNOSIS — Z8249 Family history of ischemic heart disease and other diseases of the circulatory system: Secondary | ICD-10-CM | POA: Insufficient documentation

## 2015-10-14 DIAGNOSIS — Z1211 Encounter for screening for malignant neoplasm of colon: Secondary | ICD-10-CM | POA: Diagnosis not present

## 2015-10-14 DIAGNOSIS — Z8379 Family history of other diseases of the digestive system: Secondary | ICD-10-CM | POA: Insufficient documentation

## 2015-10-14 DIAGNOSIS — Z87891 Personal history of nicotine dependence: Secondary | ICD-10-CM | POA: Insufficient documentation

## 2015-10-14 DIAGNOSIS — Z7982 Long term (current) use of aspirin: Secondary | ICD-10-CM | POA: Insufficient documentation

## 2015-10-14 DIAGNOSIS — Z811 Family history of alcohol abuse and dependence: Secondary | ICD-10-CM | POA: Diagnosis not present

## 2015-10-14 DIAGNOSIS — Z6829 Body mass index (BMI) 29.0-29.9, adult: Secondary | ICD-10-CM | POA: Insufficient documentation

## 2015-10-14 DIAGNOSIS — Z8673 Personal history of transient ischemic attack (TIA), and cerebral infarction without residual deficits: Secondary | ICD-10-CM | POA: Insufficient documentation

## 2015-10-14 DIAGNOSIS — I739 Peripheral vascular disease, unspecified: Secondary | ICD-10-CM | POA: Diagnosis not present

## 2015-10-14 HISTORY — PX: COLONOSCOPY WITH PROPOFOL: SHX5780

## 2015-10-14 SURGERY — COLONOSCOPY WITH PROPOFOL
Anesthesia: General

## 2015-10-14 MED ORDER — SODIUM CHLORIDE 0.9 % IV SOLN
INTRAVENOUS | Status: DC
  Administered 2015-10-14: 1000 mL via INTRAVENOUS

## 2015-10-14 MED ORDER — LIDOCAINE HCL (PF) 2 % IJ SOLN
INTRAMUSCULAR | Status: DC | PRN
  Administered 2015-10-14: 50 mg via INTRADERMAL

## 2015-10-14 MED ORDER — SODIUM CHLORIDE 0.9 % IV SOLN: INTRAVENOUS | Status: DC

## 2015-10-14 MED ORDER — PROPOFOL 10 MG/ML IV BOLUS
INTRAVENOUS | Status: DC | PRN
  Administered 2015-10-14: 50 mg via INTRAVENOUS

## 2015-10-14 MED ORDER — PROPOFOL 500 MG/50ML IV EMUL
INTRAVENOUS | Status: DC | PRN
  Administered 2015-10-14: 150 ug/kg/min via INTRAVENOUS

## 2015-10-14 NOTE — Transfer of Care (Signed)
Immediate Anesthesia Transfer of Care Note  Patient: Craig Avery [facility]  Procedure(s) Performed: Procedure(s): COLONOSCOPY WITH PROPOFOL (N/A)  Patient Location: PACU  Anesthesia Type:General  Level of Consciousness: awake  Airway & Oxygen Therapy: Patient Spontanous Breathing and Patient connected to nasal cannula oxygen  Post-op Assessment: Report given to RN and Post -op Vital signs reviewed and stable  Post vital signs: Reviewed and stable  Last Vitals:  Filed Vitals:   10/14/15 0729  BP: 147/82  Pulse: 63  Temp: 36.4 C  Resp: 17    Complications: No apparent anesthesia complications

## 2015-10-14 NOTE — H&P (Signed)
[facility]  [address], [place], [place] [address] Phone: [phone] Fax : [phone]  Primary Care Physician:  [name], MD Primary Gastroenterologist:  Dr. Allen Norris  Pre-Procedure History & Physical: HPI:  Craig Avery is a 75 y.o. male is here for an colonoscopy.   Past Medical History  Diagnosis Date  . Stroke ([place]) 05/2012    R PCA territory, presented with L paresthesias  . HTN (hypertension)   . HLD (hyperlipidemia)   . History of chicken pox   . Obesity   . 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 Carotid stenosis 01/2013    bilateral, s/p L CEA, plan rpt [place] qyear    Past Surgical History  Procedure Laterality Date  . Tee without cardioversion  05/22/2012    Procedure: TRANSESOPHAGEAL ECHOCARDIOGRAM (TEE);  Surgeon: [name], MD;  Location: [facility] ENDOSCOPY;  Service: Cardiovascular;  Laterality: N/A;  . Cataract extraction  2004    bilateral  . Wisdom tooth extraction    . Endarterectomy  07/21/2012    Left CEA; Surgeon: [name], MD  . Patch angioplasty  07/21/2012    Procedure: PATCH ANGIOPLASTY;  Surgeon: [name], MD;  Location: [facility] OR;  Service: Vascular;  Laterality: Left;  Using 1 cm x 6 cm Vascu Guard patch   . Carotid endarterectomy Left 07-21-12    cea  . Colonoscopy with propofol N/A 06/24/2015    Procedure: COLONOSCOPY WITH PROPOFOL;  Surgeon: [name], MD;  Location: [facility] ENDOSCOPY;  Service: Endoscopy;  Laterality: N/A;  . Eye surgery      Prior to Admission medications   Medication Sig Start Date End Date Taking? Authorizing Provider  clopidogrel (PLAVIX) 75 MG tablet TAKE 1 TABLET BY MOUTH ONCE A DAY 07/03/15  Yes [name], MD  amLODipine (NORVASC) 10 MG tablet TAKE 1 TABLET BY MOUTH EVERY DAY 05/07/15   [name], MD  aspirin 81 MG tablet Take 81 mg by mouth daily.    Historical Provider, MD  atorvastatin  (LIPITOR) 20 MG tablet TAKE 1 TABLET BY MOUTH DAILY AT 6PM 02/10/15   Minna Merritts, MD  Cyanocobalamin (VITAMIN B 12 PO) Take by mouth daily.    Historical Provider, MD  docusate sodium (COLACE) 100 MG capsule Take 100 mg by mouth daily as needed for mild constipation. Reported on 07/31/2015    Historical Provider, MD  HYDROcodone-homatropine (HYCODAN) 5-1.5 MG/5ML syrup Take 5 mLs by mouth every 8 (eight) hours as needed for cough. Patient not taking: Reported on 07/31/2015 03/25/15   Jearld Fenton, NP  lisinopril-hydrochlorothiazide (ZESTORETIC) 20-12.5 MG per tablet Take 1 tablet by mouth 2 (two) times daily. 10/23/14   Minna Merritts, MD  Multiple Vitamins-Minerals (CENTRUM SILVER ADULT 50+ PO) Take 1 tablet by mouth daily.    Historical Provider, MD  polyethylene glycol powder (GLYCOLAX/MIRALAX) powder Take 17 g by mouth daily as needed for moderate constipation. Patient not taking: Reported on 07/31/2015 11/20/14   [name], MD    Allergies as of 08/01/2015  . (No Known Allergies)    Family History  Problem Relation Age of Onset  . CAD Brother 51    MI  . Heart disease Brother     before age 25  . Heart attack Brother   . Hypertension Mother   . Cirrhosis Father 61    EtOHic  . Aneurysm Paternal Uncle 17  .  Heart disease Daughter   . Hyperlipidemia Daughter     Social History   Social History  . Marital Status: Married    Spouse Name: N/A  . Number of Children: N/A  . Years of Education: N/A   Occupational History  . Not on file.   Social History Main Topics  . Smoking status: Former Smoker -- 1.50 packs/day for 33 years    Types: Cigarettes    Quit date: 06/18/1994  . Smokeless tobacco: Never Used  . Alcohol Use: 6.0 - 7.2 oz/week    10-12 Cans of beer per week     Comment: Consumes 5 beers per week  . Drug Use: No  . Sexual Activity: Yes   Other Topics Concern  . Not on file   Social History Narrative   Caffeine: light coffee in am, working on  cutting back   Lives with wife, 1 dog.  Daughter next door.  Other children nearby.   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   Diet: not much water, some fruits/vegetables       Advanced directives - does not want prolonged life support if irreversible damage. Would want wife and daughters to be HCPOA. Requests info today.    Review of Systems: See HPI, otherwise negative ROS  Physical Exam: BP 147/82 mmHg  Pulse 63  Temp(Src) 97.6 F (36.4 C) (Tympanic)  Resp 17  Ht 5\' 5"  (1.651 m)  Wt 180 lb (81.647 kg)  BMI 29.95 kg/m2  SpO2 99% General:   Alert,  pleasant and cooperative in NAD Head:  Normocephalic and atraumatic. Neck:  Supple; no masses or thyromegaly. Lungs:  Clear throughout to auscultation.    Heart:  Regular rate and rhythm. Abdomen:  Soft, nontender and nondistended. Normal bowel sounds, without guarding, and without rebound.   Neurologic:  Alert and  oriented x4;  grossly normal neurologically.  Impression/Plan: [name] is here for an colonoscopy to be performed for history of colon polyps   Risks, benefits, limitations, and alternatives regarding  colonoscopy have been reviewed with the patient.  Questions have been answered.  All parties agreeable.   Ollen Bowl, MD  10/14/2015, 7:40 AM

## 2015-10-14 NOTE — Anesthesia Postprocedure Evaluation (Signed)
Anesthesia Post Note  Patient: Craig Avery [facility]  Procedure(s) Performed: Procedure(s) (LRB): COLONOSCOPY WITH PROPOFOL (N/A)  Patient location during evaluation: Endoscopy Anesthesia Type: General Level of consciousness: awake and alert Pain management: pain level controlled Vital Signs Assessment: post-procedure vital signs reviewed and stable Respiratory status: spontaneous breathing, nonlabored ventilation, respiratory function stable and patient connected to nasal cannula oxygen Cardiovascular status: blood pressure returned to baseline and stable Postop Assessment: no signs of nausea or vomiting Anesthetic complications: no    Last Vitals:  Filed Vitals:   10/14/15 0850 10/14/15 0900  BP: 156/67 146/83  Pulse:    Temp:    Resp:      Last Pain: There were no vitals filed for this visit.               Martha Clan

## 2015-10-14 NOTE — Anesthesia Preprocedure Evaluation (Signed)
Anesthesia Evaluation  Patient identified by MRN, date of birth, ID band Patient awake    Reviewed: Allergy & Precautions, H&P , NPO status , Patient's Chart, lab work & pertinent test results, reviewed documented beta blocker date and time   History of Anesthesia Complications Negative for: history of anesthetic complications  Airway Mallampati: III  TM Distance: >3 FB Neck ROM: full    Dental no notable dental hx. (+) Edentulous Upper, Missing Tooth intact:   Pulmonary neg shortness of breath, neg sleep apnea, neg COPD, neg recent URI, former smoker,    Pulmonary exam normal breath sounds clear to auscultation       Cardiovascular Exercise Tolerance: Good hypertension, (-) angina+ Peripheral Vascular Disease  (-) CAD, (-) Past MI, (-) Cardiac Stents and (-) CABG Normal cardiovascular exam(-) dysrhythmias (-) Valvular Problems/Murmurs Rhythm:regular Rate:Normal     Neuro/Psych neg Seizures TIACVA, No Residual Symptoms negative psych ROS   GI/Hepatic negative GI ROS, Neg liver ROS,   Endo/Other  negative endocrine ROS  Renal/GU CRFRenal disease  negative genitourinary   Musculoskeletal   Abdominal   Peds  Hematology negative hematology ROS (+)   Anesthesia Other Findings Past Medical History:   Stroke ([place])                                    05/2012        Comment:R PCA territory, presented with L paresthesias   HTN (hypertension)                                           HLD (hyperlipidemia)                                         History of chicken pox                                       Obesity                                                      Left homonymous hemianopsia                     05/2012        Comment:from CVA - completely resolved as of 08/2012               ophtho eval ([place])   Carotid stenosis                                01/2013         Comment:bilateral, s/p L CEA, plan rpt [place] qyear   Reproductive/Obstetrics negative OB ROS                             Anesthesia Physical Anesthesia Plan  ASA: III  Anesthesia Plan: General   Post-op Pain Management:  Induction:   Airway Management Planned:   Additional Equipment:   Intra-op Plan:   Post-operative Plan:   Informed Consent: I have reviewed the patients History and Physical, chart, labs and discussed the procedure including the risks, benefits and alternatives for the proposed anesthesia with the patient or authorized representative who has indicated his/her understanding and acceptance.   Dental Advisory Given  Plan Discussed with: Anesthesiologist, CRNA and Surgeon  Anesthesia Plan Comments:         Anesthesia Quick Evaluation

## 2015-10-14 NOTE — Transfer of Care (Incomplete)
Immediate Anesthesia Transfer of Care Note  Patient: Craig Avery [facility]  Procedure(s) Performed: Procedure(s): COLONOSCOPY WITH PROPOFOL (N/A)  Patient Location: {PLACES; ANE POST:19477::"PACU"}  Anesthesia Type:{PROCEDURES; ANE POST ANESTHESIA TYPE:19480}  Level of Consciousness: {FINDINGS; ANE POST LEVEL OF CONSCIOUSNESS:19484}  Airway & Oxygen Therapy: {Exam; oxygen device:30095}  Post-op Assessment: {ASSESSMENT;POST-OP MU:3154226  Post vital signs: {DESC; ANE POST JF:[id number]  Last Vitals:  Filed Vitals:   10/14/15 0729 10/14/15 0829  BP: 147/82 122/50  Pulse: 63 75  Temp: 36.4 C 36 C  Resp: 17 24    Complications: {FINDINGS; ANE POST COMPLICATIONS:19485}

## 2015-10-14 NOTE — Op Note (Signed)
[facility] Patient Name: Craig Avery Procedure Date: 10/14/2015 8:02 AM MRN: [id number] Account #: [id number] Date of Birth: [birth year] Admit Type: Outpatient Age: 75 Room: [facility] ENDO ROOM 4 Gender: Male Note Status: Finalized Procedure:            Colonoscopy Indications:          High risk colon cancer surveillance: Personal history                        of adenoma with high grade dysplasia Providers:            [name], MD Referring MD:         [name] (Referring MD) Medicines:            Propofol per Anesthesia Complications:        No immediate complications. Procedure:            Pre-Anesthesia Assessment:                       - Prior to the procedure, a History and Physical was                        performed, and patient medications and allergies were                        reviewed. The patient's tolerance of previous                        anesthesia was also reviewed. The risks and benefits of                        the procedure and the sedation options and risks were                        discussed with the patient. All questions were                        answered, and informed consent was obtained. Prior                        Anticoagulants: The patient has taken no previous                        anticoagulant or antiplatelet agents. ASA Grade                        Assessment: II - A patient with mild systemic disease.                        After reviewing the risks and benefits, the patient was                        deemed in satisfactory condition to undergo the                        procedure.                       After obtaining informed consent, the colonoscope was  passed under direct vision. Throughout the procedure,                        the patient's blood pressure, pulse, and oxygen                        saturations were monitored continuously. The                         Colonoscope was introduced through the anus and                        advanced to the the cecum, identified by appendiceal                        orifice and ileocecal valve. The colonoscopy was                        performed without difficulty. The patient tolerated the                        procedure well. The quality of the bowel preparation                        was excellent. Findings:      The perianal and digital rectal examinations were normal.      A 4 mm polyp was found in the sigmoid colon. The polyp was sessile. The       polyp was removed with a cold snare. Resection and retrieval were       complete.      Non-bleeding internal hemorrhoids were found during retroflexion. The       hemorrhoids were Grade II (internal hemorrhoids that prolapse but reduce       spontaneously). Impression:           - One 4 mm polyp in the sigmoid colon, removed with a                        cold snare. Resected and retrieved.                       - Non-bleeding internal hemorrhoids. Recommendation:       - Await pathology results. Procedure Code(s):    --- Professional ---                       [phone], Colonoscopy, flexible; with removal of tumor(s),                        polyp(s), or other lesion(s) by snare technique Diagnosis Code(s):    --- Professional ---                       Z86.010, Personal history of colonic polyps                       D12.5, Benign neoplasm of sigmoid colon CPT copyright 2016 [employer] Medical Association. All rights reserved. The codes documented in this report are preliminary and upon coder review may  be revised to meet current compliance requirements. [name], MD 10/14/2015 8:27:02 AM This report has been signed electronically. Number of  Addenda: 0 Note Initiated On: 10/14/2015 8:02 AM Scope Withdrawal Time: 0 hours 11 minutes 52 seconds  Total Procedure Duration: 0 hours 13 minutes 55 seconds       [facility]

## 2015-10-15 ENCOUNTER — Encounter: Payer: Self-pay | Admitting: Gastroenterology

## 2015-10-15 LAB — SURGICAL PATHOLOGY

## 2015-10-16 ENCOUNTER — Encounter: Payer: Self-pay | Admitting: Gastroenterology

## 2015-10-16 ENCOUNTER — Encounter: Payer: Self-pay | Admitting: Family Medicine

## 2015-10-16 ENCOUNTER — Other Ambulatory Visit: Payer: Self-pay | Admitting: Cardiovascular Disease

## 2015-10-18 ENCOUNTER — Encounter: Payer: Self-pay | Admitting: Family Medicine

## 2015-10-28 ENCOUNTER — Other Ambulatory Visit: Payer: Self-pay | Admitting: Family Medicine

## 2015-11-24 ENCOUNTER — Encounter: Payer: Self-pay | Admitting: Family Medicine

## 2015-11-24 ENCOUNTER — Ambulatory Visit (INDEPENDENT_AMBULATORY_CARE_PROVIDER_SITE_OTHER): Payer: Medicare Other | Admitting: Family Medicine

## 2015-11-24 VITALS — BP 136/66 | HR 60 | Temp 98.0°F | Wt 193.2 lb

## 2015-11-24 DIAGNOSIS — E785 Hyperlipidemia, unspecified: Secondary | ICD-10-CM | POA: Diagnosis not present

## 2015-11-24 DIAGNOSIS — I10 Essential (primary) hypertension: Secondary | ICD-10-CM

## 2015-11-24 DIAGNOSIS — M25511 Pain in right shoulder: Secondary | ICD-10-CM

## 2015-11-24 DIAGNOSIS — N183 Chronic kidney disease, stage 3 unspecified: Secondary | ICD-10-CM

## 2015-11-24 DIAGNOSIS — I6523 Occlusion and stenosis of bilateral carotid arteries: Secondary | ICD-10-CM | POA: Diagnosis not present

## 2015-11-24 LAB — RENAL FUNCTION PANEL
ALBUMIN: 4.5 g/dL (ref 3.5–5.2)
BUN: 32 mg/dL — ABNORMAL HIGH (ref 6–23)
CALCIUM: 9.6 mg/dL (ref 8.4–10.5)
CHLORIDE: 103 meq/L (ref 96–112)
CO2: 27 meq/L (ref 19–32)
Creatinine, Ser: 1.49 mg/dL (ref 0.40–1.50)
GFR: 48.66 mL/min — AB (ref >60.00)
Glucose, Bld: 106 mg/dL — ABNORMAL HIGH (ref 70–99)
POTASSIUM: 4.2 meq/L (ref 3.5–5.1)
Phosphorus: 3.7 mg/dL (ref 2.3–4.6)
Sodium: 137 mEq/L (ref 135–145)

## 2015-11-24 LAB — MICROALBUMIN / CREATININE URINE RATIO
Creatinine,U: 77.9 mg/dL
MICROALB/CREAT RATIO: 0.9 mg/g (ref 0.0–30.0)
Microalb, Ur: <0.7 mg/dL (ref 0.0–1.9)

## 2015-11-24 LAB — VITAMIN D 25 HYDROXY (VIT D DEFICIENCY, FRACTURES): VITD: 51.15 ng/mL (ref 30.00–100.00)

## 2015-11-24 MED ORDER — DICLOFENAC SODIUM 1 % TD GEL: 1.0000 "application " | Freq: Three times a day (TID) | TRANSDERMAL | Status: DC

## 2015-11-24 NOTE — Assessment & Plan Note (Signed)
Chronic, stable. Continue current regimen. 

## 2015-11-24 NOTE — Progress Notes (Signed)
Pre visit review using our clinic review tool, if applicable. No additional management support is needed unless otherwise documented below in the visit note. 

## 2015-11-24 NOTE — Progress Notes (Signed)
BP 136/66 mmHg  Pulse 60  Temp(Src) 98 F (36.7 C) (Oral)  Wt 193 lb 4 oz (87.658 kg)   CC: 6 mo f/u visit  Subjective:    Patient ID: Craig Avery, male    DOB: 24-Apr-1941, 75 y.o.   MRN: [id number]  HPI: CATHAL REITENBACH is a 75 y.o. male presenting on 11/24/2015 for Follow-up   Carotid stenosis - followed yearly. S/p L CEA. Last [place] 02/2015.   Ongoing R shoulder pain. Noted worse pain with movement of arm at shoulder as well as reaching to back. Denies neck pain or numbness/weakness down arm. Points to lateral deltoid and describes sharp stabbing pain. Denies inciting trauma/falls or injury. Has tried nothing for this. Currently no pain - worse with certain positions.   HTN - Compliant with current antihypertensive regimen of amlodipine 10mg  daily and lisinopril hctz 20/12.5mg  daily. Does not check blood pressures at home. No low blood pressure readings or symptoms of dizziness/syncope. Denies HA, vision changes, CP/tightness, SOB, leg swelling.    HLD - compliant with atorvastatin.   On aspirin and plavix.  Relevant past medical, surgical, family and social history reviewed and updated as indicated. Interim medical history since our last visit reviewed. Allergies and medications reviewed and updated. Current Outpatient Prescriptions on File Prior to Visit  Medication Sig  . amLODipine (NORVASC) 10 MG tablet TAKE 1 TABLET BY MOUTH EVERY DAY  . aspirin 81 MG tablet Take 81 mg by mouth daily.  Marland Kitchen atorvastatin (LIPITOR) 20 MG tablet TAKE 1 TABLET BY MOUTH DAILY AT 6PM  . clopidogrel (PLAVIX) 75 MG tablet TAKE 1 TABLET BY MOUTH ONCE A DAY  . Cyanocobalamin (VITAMIN B 12 PO) Take by mouth daily.  Marland Kitchen docusate sodium (COLACE) 100 MG capsule Take 100 mg by mouth daily as needed for mild constipation. Reported on 07/31/2015  . lisinopril-hydrochlorothiazide (PRINZIDE,ZESTORETIC) 20-12.5 MG tablet TAKE 1 TABLET BY MOUTH TWICE A DAY  . Multiple Vitamins-Minerals (CENTRUM SILVER ADULT 50+  PO) Take 1 tablet by mouth daily.   No current facility-administered medications on file prior to visit.    Review of Systems Per HPI unless specifically indicated in ROS section     Objective:    BP 136/66 mmHg  Pulse 60  Temp(Src) 98 F (36.7 C) (Oral)  Wt 193 lb 4 oz (87.658 kg)  Wt Readings from Last 3 Encounters:  11/24/15 193 lb 4 oz (87.658 kg)  10/14/15 180 lb (81.647 kg)  07/31/15 197 lb (89.359 kg)    Physical Exam  Constitutional: He appears well-developed and well-nourished. No distress.  HENT:  Mouth/Throat: Oropharynx is clear and moist. No oropharyngeal exudate.  Neck: Normal range of motion. Neck supple. Carotid bruit is present (R sided, L clear).  Cardiovascular: Normal rate, regular rhythm, normal heart sounds and intact distal pulses.   No murmur heard. Pulmonary/Chest: Effort normal and breath sounds normal. No respiratory distress. He has no wheezes. He has no rales.  Musculoskeletal: He exhibits no edema.  L shoulder WNL R Shoulder exam: No deformity of shoulders on inspection. No pain with palpation of shoulder landmarks. Limited ROM R shoulder past 90 degrees both passive and active.  No pain or weakness with testing SITS in ext/int rotation. ++ pain with empty can sign. Neg Speed. No pain with rotation of humeral head in [facility] joint but limited mobility.   Skin: Skin is warm and dry. No rash noted.  Psychiatric: He has a normal mood and affect.  Nursing note  and vitals reviewed.      Assessment & Plan:   Problem List Items Addressed This Visit    HTN (hypertension) - Primary (Chronic)    Chronic, stable. Continue current regimen.       Carotid stenosis, bilateral    S/p L CEA. R stenosis monitored yearly.      Hyperlipidemia    Chronic, stable. Continue current regimen.      CKD (chronic kidney disease) stage 3, GFR 30-59 ml/min    Recheck today along with microalbumin.      Relevant Orders   Renal function panel   Microalbumin  / creatinine urine ratio   VITAMIN D 25 Hydroxy (Vit-D Deficiency, Fractures)   Right shoulder pain    Anticipate supraspinatus tendonitis vs partial tear with developing adhesive capsulitis. Offered steroid shot - pt declines at this time (needle phobia). Will treat with voltaren gel as well as referral to PT. Pt agrees if no better to return for steroid shot. If not improvement, will refer to ortho.       Relevant Orders   Ambulatory referral to Physical Therapy       Follow up plan: Return in about 6 months (around 05/26/2016), or if symptoms worsen or fail to improve, for medicare wellness visit.  [name], MD

## 2015-11-24 NOTE — Assessment & Plan Note (Signed)
S/p L CEA. R stenosis monitored yearly.

## 2015-11-24 NOTE — Patient Instructions (Signed)
Blood work today. For shoulder - you do have tendonitis of rotator cuff and developing frozen shoulder. Treat with voltaren gel and physical therapy. If no better, return for steroid shot into shoulder.

## 2015-11-24 NOTE — Assessment & Plan Note (Signed)
Recheck today along with microalbumin.

## 2015-11-24 NOTE — Assessment & Plan Note (Signed)
Anticipate supraspinatus tendonitis vs partial tear with developing adhesive capsulitis. Offered steroid shot - pt declines at this time (needle phobia). Will treat with voltaren gel as well as referral to PT. Pt agrees if no better to return for steroid shot. If not improvement, will refer to ortho.

## 2015-11-26 DIAGNOSIS — L570 Actinic keratosis: Secondary | ICD-10-CM | POA: Diagnosis not present

## 2015-11-26 DIAGNOSIS — Z85828 Personal history of other malignant neoplasm of skin: Secondary | ICD-10-CM | POA: Diagnosis not present

## 2015-11-27 DIAGNOSIS — M25511 Pain in right shoulder: Secondary | ICD-10-CM | POA: Diagnosis not present

## 2015-12-01 ENCOUNTER — Encounter: Payer: Self-pay | Admitting: *Deleted

## 2015-12-01 DIAGNOSIS — M25511 Pain in right shoulder: Secondary | ICD-10-CM | POA: Diagnosis not present

## 2015-12-04 DIAGNOSIS — M25511 Pain in right shoulder: Secondary | ICD-10-CM | POA: Diagnosis not present

## 2015-12-08 DIAGNOSIS — M25511 Pain in right shoulder: Secondary | ICD-10-CM | POA: Diagnosis not present

## 2015-12-11 DIAGNOSIS — M25511 Pain in right shoulder: Secondary | ICD-10-CM | POA: Diagnosis not present

## 2015-12-15 DIAGNOSIS — M25511 Pain in right shoulder: Secondary | ICD-10-CM | POA: Diagnosis not present

## 2015-12-18 DIAGNOSIS — M25511 Pain in right shoulder: Secondary | ICD-10-CM | POA: Diagnosis not present

## 2015-12-23 DIAGNOSIS — M25511 Pain in right shoulder: Secondary | ICD-10-CM | POA: Diagnosis not present

## 2016-01-06 ENCOUNTER — Encounter: Payer: Self-pay | Admitting: Family Medicine

## 2016-01-06 ENCOUNTER — Ambulatory Visit (INDEPENDENT_AMBULATORY_CARE_PROVIDER_SITE_OTHER): Payer: Medicare Other | Admitting: Family Medicine

## 2016-01-06 ENCOUNTER — Ambulatory Visit (INDEPENDENT_AMBULATORY_CARE_PROVIDER_SITE_OTHER)
Admission: RE | Admit: 2016-01-06 | Discharge: 2016-01-06 | Disposition: A | Discharge status: Home / Self Care | Payer: Medicare Other | Source: Ambulatory Visit | Attending: Family Medicine | Admitting: Family Medicine

## 2016-01-06 VITALS — BP 144/68 | HR 80 | Temp 98.7°F | Wt 194.5 lb

## 2016-01-06 DIAGNOSIS — I6523 Occlusion and stenosis of bilateral carotid arteries: Secondary | ICD-10-CM | POA: Diagnosis not present

## 2016-01-06 DIAGNOSIS — M25511 Pain in right shoulder: Secondary | ICD-10-CM | POA: Diagnosis not present

## 2016-01-06 DIAGNOSIS — M19011 Primary osteoarthritis, right shoulder: Secondary | ICD-10-CM | POA: Diagnosis not present

## 2016-01-06 NOTE — Patient Instructions (Addendum)
Xray of shoulder today.  Continue home exercise program. If worsening shoulder pain, return for steroid shot.  Return in 5-6 months for medicare wellness visit

## 2016-01-06 NOTE — Assessment & Plan Note (Signed)
Some improvement with PT (12/2015). Encouraged continue HEP. Overall improvement after PT but persistent limited ROM and pain. Evidence for adhesive capsulitis again today.  Declines steroid injection at this time, not interested in ortho/surgery eval at this time.  Check xray to eval arthritic burden and eval for acromial impingement.

## 2016-01-06 NOTE — Progress Notes (Signed)
BP 144/68 mmHg  Pulse 80  Temp(Src) 98.7 F (37.1 C) (Oral)  Wt 194 lb 8 oz (88.225 kg)  SpO2 97%   CC: f/u visit  Subjective:    Patient ID: Craig Avery, male    DOB: 1941/05/01, 75 y.o.   MRN: [id number]  HPI: Craig Avery is a 75 y.o. male presenting on 01/06/2016 for Follow-up   R handed.  See prior note for details. Seen here 11/24/2015 with several month h/o R shoulder pain. Evaluation last thought supraspinatus tendonitis vs partial tear with developing adhesive capsulitis. Pt declined steroid injection (needle phobia) so we treated with voltaren gel (did not afford) and PT referral. He completed 4 weeks of therapy with [facility]. Some better but persistent pain, decreased range of motion when reaching backwards at shoulder.  Has HEP. Pain aggravated with certain movements.   Declines steroid injection today as well - but agrees to return if shoulder pain starts worsening.   Relevant past medical, surgical, family and social history reviewed and updated as indicated. Interim medical history since our last visit reviewed. Allergies and medications reviewed and updated. Current Outpatient Prescriptions on File Prior to Visit  Medication Sig  . amLODipine (NORVASC) 10 MG tablet TAKE 1 TABLET BY MOUTH EVERY DAY  . aspirin 81 MG tablet Take 81 mg by mouth daily.  Marland Kitchen atorvastatin (LIPITOR) 20 MG tablet TAKE 1 TABLET BY MOUTH DAILY AT 6PM  . clopidogrel (PLAVIX) 75 MG tablet TAKE 1 TABLET BY MOUTH ONCE A DAY  . Cyanocobalamin (VITAMIN B 12 PO) Take by mouth daily.  Marland Kitchen docusate sodium (COLACE) 100 MG capsule Take 100 mg by mouth daily as needed for mild constipation. Reported on 07/31/2015  . lisinopril-hydrochlorothiazide (PRINZIDE,ZESTORETIC) 20-12.5 MG tablet TAKE 1 TABLET BY MOUTH TWICE A DAY  . Multiple Vitamins-Minerals (CENTRUM SILVER ADULT 50+ PO) Take 1 tablet by mouth daily.   No current facility-administered medications on file prior to visit.     Review of Systems Per HPI unless specifically indicated in ROS section     Objective:    BP 144/68 mmHg  Pulse 80  Temp(Src) 98.7 F (37.1 C) (Oral)  Wt 194 lb 8 oz (88.225 kg)  SpO2 97%  Wt Readings from Last 3 Encounters:  01/06/16 194 lb 8 oz (88.225 kg)  11/24/15 193 lb 4 oz (87.658 kg)  10/14/15 180 lb (81.647 kg)    Physical Exam  Constitutional: He appears well-developed and well-nourished. No distress.  HENT:  Mouth/Throat: Oropharynx is clear and moist. No oropharyngeal exudate.  Musculoskeletal: He exhibits no edema.  No pain at subacromial bursa or at humerus Limited ROM of R shoulder, both active and passively  Psychiatric: He has a normal mood and affect.  Nursing note and vitals reviewed.     Assessment & Plan:   Problem List Items Addressed This Visit    Right shoulder pain - Primary    Some improvement with PT (12/2015). Encouraged continue HEP. Overall improvement after PT but persistent limited ROM and pain. Evidence for adhesive capsulitis again today.  Declines steroid injection at this time, not interested in ortho/surgery eval at this time.  Check xray to eval arthritic burden and eval for acromial impingement.       Relevant Orders   DG Shoulder Right       Follow up plan: Return in about 6 months (around 07/07/2016), or if symptoms worsen or fail to improve, for follow up visit.  [name], MD

## 2016-01-06 NOTE — Progress Notes (Signed)
Pre visit review using our clinic review tool, if applicable. No additional management support is needed unless otherwise documented below in the visit note. 

## 2016-01-12 ENCOUNTER — Other Ambulatory Visit: Payer: Self-pay | Admitting: *Deleted

## 2016-01-12 MED ORDER — LISINOPRIL-HYDROCHLOROTHIAZIDE 20-12.5 MG PO TABS: 1.0000 | ORAL_TABLET | Freq: Two times a day (BID) | ORAL | Status: DC

## 2016-01-30 ENCOUNTER — Encounter (INDEPENDENT_AMBULATORY_CARE_PROVIDER_SITE_OTHER): Payer: Self-pay

## 2016-01-30 ENCOUNTER — Encounter: Payer: Self-pay | Admitting: Cardiovascular Disease

## 2016-01-30 ENCOUNTER — Ambulatory Visit (INDEPENDENT_AMBULATORY_CARE_PROVIDER_SITE_OTHER): Payer: Medicare Other | Admitting: Cardiovascular Disease

## 2016-01-30 VITALS — BP 138/60 | HR 55 | Ht 63.0 in | Wt 193.0 lb

## 2016-01-30 DIAGNOSIS — N183 Chronic kidney disease, stage 3 unspecified: Secondary | ICD-10-CM

## 2016-01-30 DIAGNOSIS — I10 Essential (primary) hypertension: Secondary | ICD-10-CM

## 2016-01-30 DIAGNOSIS — I6523 Occlusion and stenosis of bilateral carotid arteries: Secondary | ICD-10-CM | POA: Diagnosis not present

## 2016-01-30 DIAGNOSIS — E785 Hyperlipidemia, unspecified: Secondary | ICD-10-CM | POA: Diagnosis not present

## 2016-01-30 NOTE — Progress Notes (Signed)
Patient ID: Craig Avery, male   DOB: 1941-02-23, 75 y.o.   MRN: [id number] Cardiology Office Note  Date:  01/30/2016   ID:  Craig Avery, DOB [birth year], MRN [id number]  PCP:  [name], MD   Chief Complaint  Patient presents with  . other    1 yr f/u. Meds reviewed verbally with pt.    HPI:  75 year-old gentleman with history of hypertension and peripheral vascular disease,Carotid endarterectomy on the left July 2014, smoking history , recent hospitalization x2 at [facility], L sided paresthesias, Found to have stroke (acute right PCA distribution ischemic infarction involving the right occipital region and right thalamus along with severe R PCA), returned a few days later to ER hypertensive to [id number] systolic, With medication management for blood pressure He presents for follow-up of his PAD, hypertension  In follow-up, he reports that he is doing well.  Chronic back pain, unable to walk for a far Denies any chest pain, active at baseline Overall has no new complaints Occasional lightheadedness, working out in the sun for long hours at a time  Previous carotid ultrasound in 2016 showing patent left carotid after carotid endarterectomy, less than 40% on the right Lab work reviewed November 2016 total cholesterol 123, LDL 71 Creatinine 1.49, BUN 32  EKG on today's visit shows normal sinus rhythm with rate 55 bpm, no significant ST or T-wave changes  Other past medical history History of severe left carotid stenosis, estimated at greater than 70% of the proximal left internal carotid artery.  He has refused open carotid endarterectomy in the past but reconsidered late 2013 and had left CEA in [place].  Prior ultrasound showedCarotid stenosis - R 40-59%, L >80% TEE - vigorous systolic fxn with EF [id number], no vegetations/thrombus. Total cholesterol 140, LDL 72  Initial MRI/MRA: 1. Small acute infarcts in the right hippocampus and right occipital lobe, implicating right PCA  territory ischemia. No mass effect or hemorrhage.  2. Chronic small vessel ischemia in the left basal ganglia and possibly also the medial left thalamus.   IMPRESSION: 1. High-grade stenosis or focal near occlusion of the right PCA distal P2 segment with attenuated more distal right PCA flow.  2. Moderate to severe focal stenosis of the left PCA P2 segment.  3. 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  4. Mild to moderate anterior circulation atherosclerosis, most pronounced in the MCA M1 segments.  No Known Allergies  PMH:   has a past medical history of Stroke ([place]) (05/2012); HTN (hypertension); HLD (hyperlipidemia); History of chicken pox; Obesity; Left homonymous hemianopsia (05/2012); and Carotid stenosis (01/2013).  PSH:    Past Surgical History  Procedure Laterality Date  . Tee without cardioversion  05/22/2012    Procedure: TRANSESOPHAGEAL ECHOCARDIOGRAM (TEE);  Surgeon: [name], MD;  Location: [facility] ENDOSCOPY;  Service: Cardiovascular;  Laterality: N/A;  . Cataract extraction  2004    bilateral  . Wisdom tooth extraction    . Endarterectomy  07/21/2012    Left CEA; Surgeon: [name], MD  . Patch angioplasty  07/21/2012    Procedure: PATCH ANGIOPLASTY;  Surgeon: [name], MD;  Location[facility];  Service: Vascular;  Laterality: Left;  Using 1 cm x 6 cm Vascu Guard patch   . Carotid endarterectomy Left 07-21-12    cea  . Colonoscopy with propofol N/A 06/24/2015    3 polyps, one with high grade dysplasia, rpt 3 mo; [name], MD  . Eye surgery    .  Colonoscopy with propofol N/A 10/14/2015    hyperplastic polyp; f/u PRN; [name], MD    Current Outpatient Prescriptions  Medication Sig Dispense Refill  . amLODipine (NORVASC) 10 MG tablet TAKE 1 TABLET BY MOUTH EVERY DAY 90 tablet 1  . aspirin 81 MG tablet Take 81 mg by mouth daily.    Marland Kitchen atorvastatin (LIPITOR) 20 MG tablet TAKE 1 TABLET BY MOUTH DAILY AT 6PM 90 tablet 3  . clopidogrel  (PLAVIX) 75 MG tablet TAKE 1 TABLET BY MOUTH ONCE A DAY 90 tablet 3  . Cyanocobalamin (VITAMIN B 12 PO) Take by mouth daily.    Marland Kitchen docusate sodium (COLACE) 100 MG capsule Take 100 mg by mouth daily as needed for mild constipation. Reported on 07/31/2015    . lisinopril-hydrochlorothiazide (PRINZIDE,ZESTORETIC) 20-12.5 MG tablet Take 1 tablet by mouth 2 (two) times daily. 180 tablet 0  . Multiple Vitamins-Minerals (CENTRUM SILVER ADULT 50+ PO) Take 1 tablet by mouth daily.     No current facility-administered medications for this visit.     Allergies:   Review of patient's allergies indicates no known allergies.   Social History:  The patient  reports that he quit smoking about 21 years ago. His smoking use included Cigarettes. He has a 49.5 pack-year smoking history. He has never used smokeless tobacco. He reports that he drinks about 6.0 - 7.2 oz of alcohol per week. He reports that he does not use illicit drugs.   Family History:   family history includes Aneurysm (age of onset: 68) in his paternal uncle; CAD (age of onset: 33) in his brother; Cirrhosis (age of onset: 68) in his father; Heart attack in his brother; Heart disease in his brother and daughter; Hyperlipidemia in his daughter; Hypertension in his mother.    Review of Systems: Review of Systems  Constitutional: Negative.   Respiratory: Negative.   Cardiovascular: Negative.   Gastrointestinal: Negative.   Musculoskeletal: Negative.   Neurological: Negative.   Psychiatric/Behavioral: Negative.   All other systems reviewed and are negative.    PHYSICAL EXAM: VS:  BP 138/60 mmHg  Pulse 55  Ht 5\' 3"  (1.6 m)  Wt 193 lb (87.544 kg)  BMI 34.20 kg/m2 , BMI Body mass index is 34.2 kg/(m^2). GEN: Well nourished, well developed, in no acute distress HEENT: normal Neck: no JVD, carotid bruits, or masses Cardiac: RRR; no murmurs, rubs, or gallops,no edema  Respiratory:  clear to auscultation bilaterally, normal work of  breathing GI: soft, nontender, nondistended, + BS MS: no deformity or atrophy Skin: warm and dry, no rash Neuro:  Strength and sensation are intact Psych: euthymic mood, full affect    Recent Labs: 05/20/2015: Hemoglobin 11.9*; Platelets 247.0 11/24/2015: BUN 32*; Creatinine, Ser 1.49; Potassium 4.2; Sodium 137    Lipid Panel Lab Results  Component Value Date   CHOL 123 05/20/2015   HDL 40.80 05/20/2015   LDLCALC 71 05/20/2015   TRIG 59.0 05/20/2015      Wt Readings from Last 3 Encounters:  01/30/16 193 lb (87.544 kg)  01/06/16 194 lb 8 oz (88.225 kg)  11/24/15 193 lb 4 oz (87.658 kg)       ASSESSMENT AND PLAN:  Essential hypertension - Plan: EKG 12-Lead Blood pressure is well controlled on today's visit. No changes made to the medications. Occasional episodes of lightheadedness after working in the hot sun On those occasions, recommended he hold his evening blood pressure pill. Likely having orthostasis Encouraged him to increase his fluids  Hyperlipidemia Cholesterol is  at goal on the current lipid regimen. No changes to the medications were made.  Carotid stenosis, bilateral Ultrasound results reviewed with him in detail. Last study 2016  CKD (chronic kidney disease) stage 3, GFR 30-59 ml/min Stable renal function, creatinine 1.49 Encouraged him to increase his fluids   Disposition:   F/U  6 months   Orders Placed This Encounter  Procedures  . EKG 12-Lead     Signed, Esmond Plants, M.D., Ph.D. 01/30/2016  [place], [place]

## 2016-01-30 NOTE — Patient Instructions (Signed)
Medication Instructions:    If you get dizzy after working out in the heat, Hold the evening lisinopril/hctz   Follow-Up: It was a pleasure seeing you in the office today. Please call us if you have new issues that need to be addressed before your next appt.  [phone]  Your physician wants you to follow-up in: 12 months.  You will receive a reminder letter in the mail two months in advance. If you don't receive a letter, please call our office to schedule the follow-up appointment.  If you need a refill on your cardiac medications before your next appointment, please call your pharmacy.

## 2016-02-07 ENCOUNTER — Other Ambulatory Visit: Payer: Self-pay | Admitting: Cardiovascular Disease

## 2016-02-09 ENCOUNTER — Other Ambulatory Visit: Payer: Self-pay

## 2016-02-09 MED ORDER — ATORVASTATIN CALCIUM 20 MG PO TABS: ORAL_TABLET | ORAL | 3 refills | Status: DC

## 2016-02-10 ENCOUNTER — Other Ambulatory Visit: Payer: Self-pay | Admitting: Cardiovascular Disease

## 2016-02-19 ENCOUNTER — Encounter: Payer: Self-pay | Admitting: Family

## 2016-02-23 ENCOUNTER — Ambulatory Visit (HOSPITAL_COMMUNITY)
Admission: RE | Admit: 2016-02-23 | Discharge: 2016-02-23 | Disposition: A | Discharge status: Home / Self Care | Payer: Medicare Other | Source: Ambulatory Visit | Attending: Surgery | Admitting: Surgery

## 2016-02-23 ENCOUNTER — Ambulatory Visit (INDEPENDENT_AMBULATORY_CARE_PROVIDER_SITE_OTHER): Payer: Medicare Other | Admitting: Family

## 2016-02-23 ENCOUNTER — Encounter: Payer: Self-pay | Admitting: Family

## 2016-02-23 VITALS — BP 181/74 | HR 50 | Temp 97.7°F | Resp 18 | Ht 64.0 in | Wt 192.0 lb

## 2016-02-23 DIAGNOSIS — Z87891 Personal history of nicotine dependence: Secondary | ICD-10-CM | POA: Insufficient documentation

## 2016-02-23 DIAGNOSIS — E785 Hyperlipidemia, unspecified: Secondary | ICD-10-CM | POA: Diagnosis not present

## 2016-02-23 DIAGNOSIS — I10 Essential (primary) hypertension: Secondary | ICD-10-CM | POA: Diagnosis not present

## 2016-02-23 DIAGNOSIS — Z9889 Other specified postprocedural states: Secondary | ICD-10-CM | POA: Insufficient documentation

## 2016-02-23 DIAGNOSIS — Z48812 Encounter for surgical aftercare following surgery on the circulatory system: Secondary | ICD-10-CM | POA: Diagnosis not present

## 2016-02-23 DIAGNOSIS — I6521 Occlusion and stenosis of right carotid artery: Secondary | ICD-10-CM | POA: Diagnosis not present

## 2016-02-23 DIAGNOSIS — I6522 Occlusion and stenosis of left carotid artery: Secondary | ICD-10-CM

## 2016-02-23 DIAGNOSIS — I6523 Occlusion and stenosis of bilateral carotid arteries: Secondary | ICD-10-CM

## 2016-02-23 LAB — VAS US CAROTID
LCCADDIAS: 9 cm/s
LCCADSYS: 66 cm/s
LEFT ECA DIAS: -6 cm/s
LEFT VERTEBRAL DIAS: -13 cm/s
LICADDIAS: -20 cm/s
LICADSYS: -89 cm/s
LICAPDIAS: -21 cm/s
LICAPSYS: -83 cm/s
Left CCA prox dias: 13 cm/s
Left CCA prox sys: 90 cm/s
RCCAPDIAS: 12 cm/s
RIGHT CCA MID DIAS: 12 cm/s
RIGHT ECA DIAS: -27 cm/s
RIGHT VERTEBRAL DIAS: -5 cm/s
Right CCA prox sys: 89 cm/s
Right cca dist sys: -92 cm/s

## 2016-02-23 NOTE — Progress Notes (Signed)
Chief Complaint: Follow up Extracranial Carotid Artery Stenosis   History of Present Illness  Craig Avery is a 75 y.o. male patient of Dr. Trula Slade who is status post left carotid endarterectomy on 07/21/2012. Intraoperative findings included 90% stenosis. He returns today for follow up.   He has a history of right brain stroke as manifested by tingling on the left side of his body and severe hypertension in 2014, just before the left CEA. He has not had any symptoms since we started him on maximal medical therapy.  He has no significant right extracranial carotid artery stenosis. He continues to be a nonsmoker. He is medically managed with dual antiplatelet therapy. He is on a statin for hypercholesterolemia.  He denies any history of amaurosis fugax or aphasia. His right hip joint hurts if he does much walking, denies non healing wounds. He states his grandmother did not smoke, did not have DM, (did not have these atherosclerotic risk factors) and had both legs amputated due to poor circulation.  Pt denies New Medical or Surgical History.  Pt Diabetic: No Pt smoker: former smoker, quit in 1995  Pt meds include: Statin : Yes ASA: Yes Other anticoagulants/antiplatelets: Plavix   Past Medical History:  Diagnosis Date  . Carotid stenosis 01/2013   bilateral, s/p L CEA, plan rpt [place] qyear  . History of chicken pox   . HLD (hyperlipidemia)   . HTN (hypertension)   . 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 Obesity   . Stroke [facility]) 05/2012   R PCA territory, presented with L paresthesias    Social History Social History  Substance Use Topics  . Smoking status: Former Smoker    Packs/day: 1.50    Years: 33.00    Types: Cigarettes    Quit date: 06/18/1994  . Smokeless tobacco: Never Used  . Alcohol use 6.0 - 7.2 oz/week    10 - 12 Cans of beer per week     Comment: Consumes 5 beers per week     Family History Family History  Problem Relation Age of Onset  . CAD Brother 39    MI  . Heart disease Brother     before age 22  . Heart attack Brother   . Hypertension Mother   . Cirrhosis Father 48    EtOHic  . Aneurysm Paternal Uncle 65  . Heart disease Daughter   . Hyperlipidemia Daughter     Surgical History Past Surgical History:  Procedure Laterality Date  . CAROTID ENDARTERECTOMY Left 07-21-12   cea  . CATARACT EXTRACTION  2004   bilateral  . COLONOSCOPY WITH PROPOFOL N/A 06/24/2015   3 polyps, one with high grade dysplasia, rpt 3 mo; [name], MD  . COLONOSCOPY WITH PROPOFOL N/A 10/14/2015   hyperplastic polyp; f/u PRN; [name], MD  . ENDARTERECTOMY  07/21/2012   Left CEA; Surgeon: [name], MD  . EYE SURGERY    . PATCH ANGIOPLASTY  07/21/2012   Procedure: PATCH ANGIOPLASTY;  Surgeon: [name], MD;  Location: [facility] OR;  Service: Vascular;  Laterality: Left;  Using 1 cm x 6 cm Vascu Guard patch   . TEE WITHOUT CARDIOVERSION  05/22/2012   Procedure: TRANSESOPHAGEAL ECHOCARDIOGRAM (TEE);  Surgeon: [name], MD;  [facility] ENDOSCOPY;  Service: Cardiovascular;  Laterality: N/A;  . WISDOM TOOTH EXTRACTION      No Known Allergies  Current Outpatient Prescriptions  Medication Sig Dispense Refill  . amLODipine (NORVASC) 10 MG tablet TAKE 1 TABLET BY MOUTH EVERY DAY 90 tablet 1  . aspirin 81 MG tablet Take 81 mg by mouth daily.    Marland Kitchen atorvastatin (LIPITOR) 20 MG tablet TAKE 1 TABLET BY MOUTH DAILY AT 6PM 90 tablet 3  . atorvastatin (LIPITOR) 20 MG tablet TAKE 1 TABLET BY MOUTH DAILY AT 6PM 90 tablet 3  . clopidogrel (PLAVIX) 75 MG tablet TAKE 1 TABLET BY MOUTH ONCE A DAY 90 tablet 3  . Cyanocobalamin (VITAMIN B 12 PO) Take by mouth daily.    Marland Kitchen docusate sodium (COLACE) 100 MG capsule Take 100 mg by mouth daily as needed for mild constipation. Reported on 07/31/2015    . lisinopril-hydrochlorothiazide (PRINZIDE,ZESTORETIC) 20-12.5 MG tablet  Take 1 tablet by mouth 2 (two) times daily. 180 tablet 0  . Multiple Vitamins-Minerals (CENTRUM SILVER ADULT 50+ PO) Take 1 tablet by mouth daily.     No current facility-administered medications for this visit.     Review of Systems : See HPI for pertinent positives and negatives.  Physical Examination  Vitals:   02/23/16 1127 02/23/16 1135 02/23/16 1137  BP: (!) 176/66 (!) 166/67 (!) 181/74  Pulse: (!) 52 (!) 50 (!) 50  Resp: 18    Temp: 97.7 F (36.5 C)    SpO2: 99%    Weight: 192 lb (87.1 kg)    Height: 5\' 4"  (1.626 m)     Body mass index is 32.96 kg/m.  General: WDWN obese male in NAD GAIT: normal Eyes: PERRLA Pulmonary: Respirations are non-labored, CTAB, good air movement in all fields.  Cardiac: regular rhythm, no detected murmur.  VASCULAR EXAM Carotid Bruits Right Left   Negative Negative   Radial pulses are 3+ palpable and equal.      LE Pulses Right Left   POPLITEAL not palpable  not palpable   POSTERIOR TIBIAL 1+ palpable  not palpable    DORSALIS PEDIS  ANTERIOR TIBIAL not palpable  not palpable     Gastrointestinal: soft, nontender, BS WNL, no r/g,no palpable masses.  Musculoskeletal: No muscle atrophy/wasting. M/S 5/5 throughout, Extremities without ischemic changes. Feet appear well perfused, no wounds, no edema in lower extremities.   Neurologic: A&O X 3; Appropriate Affect, Speech is normal CN 2-12 intact, Pain and light touch intact in extremities, Motor exam as listed above.   Non-Invasive Vascular Imaging CAROTID DUPLEX 02/23/2016   CEREBROVASCULAR DUPLEX EVALUATION    INDICATION: Carotid artery disease    PREVIOUS INTERVENTION(S): Left carotid endarterectomy 07/21/2012    DUPLEX EXAM: Carotid duplex    RIGHT  LEFT  Peak Systolic Velocities (cm/s) End  Diastolic Velocities (cm/s) Plaque LOCATION Peak Systolic Velocities (cm/s) End Diastolic Velocities (cm/s) Plaque  89 12  CCA PROXIMAL 90 13   76 12  CCA MID 87 15   70 12  CCA DISTAL 66 9   295 27 HT ECA 107 6   137 24 HT ICA PROXIMAL 83 21   145 20  ICA MID 90 23   92 19  ICA DISTAL 89 20     1.8 ICA / CCA Ratio (PSV) NA  Antegrade Vertebral Flow Antegrade  - Brachial Systolic Pressure (mmHg) -  Triphasic Brachial Artery Waveforms Triphasic    Plaque Morphology:  HM = Homogeneous, HT = Heterogeneous, CP = Calcific Plaque, SP = Smooth Plaque, IP = Irregular Plaque     ADDITIONAL FINDINGS: Multiphasic subclavian arteries. Kink noted in the proximal to  mid right internal carotid artery    IMPRESSION: 1. Less than 40% right internal carotid artery stenosis 2. Patent left carotid endarterectomy site with no evidence for restenosis 3. Right external carotid artery stenosis    Compared to the previous exam:  No change since exam of 02/17/2015       Assessment: Craig Avery is a 75 y.o. male who is status post left carotid endarterectomy on 07/21/2012. He has a history of right brain stroke as manifested by tingling on the left side of his body and severe hypertension in 2014, just before the left CEA; no subsequent TIA or stroke.  Today's carotid duplex suggests less than 40% right internal carotid artery stenosis and left carotid endarterectomy site with no evidence for restenosis. No change since exam of 02/17/2015.  I advised pt to see his PCP or cardiologist as soon as possible re his uncontrolled blood pressure.  He states he sometimes forgets to take his medications.   Plan: Follow-up in 1 year with Carotid Duplex scan.   I discussed in depth with the patient the nature of atherosclerosis, and emphasized the importance of maximal medical management including strict control of blood pressure, blood glucose, and lipid levels, obtaining regular exercise, and continued cessation  of smoking.  The patient is aware that without maximal medical management the underlying atherosclerotic disease process will progress, limiting the benefit of any interventions. The patient was given information about stroke prevention and what symptoms should prompt the patient to seek immediate medical care. Thank you for allowing [place] to participate in this patient's care.  Clemon Chambers, RN, MSN, FNP-C [facility] Office: [facility] Physician: Trula Slade  02/23/16 11:49 AM

## 2016-02-23 NOTE — Progress Notes (Signed)
Vitals:   02/23/16 1127 02/23/16 1135  BP: (!) 176/66 (!) 166/67  Pulse: (!) 52 (!) 50  Resp: 18   Temp: 97.7 F (36.5 C)   SpO2: 99%   Weight: 192 lb (87.1 kg)   Height: 5\' 4"  (1.626 m)

## 2016-02-23 NOTE — Patient Instructions (Signed)
Stroke Prevention Some medical conditions and behaviors are associated with an increased chance of having a stroke. You may prevent a stroke by making healthy choices and managing medical conditions. HOW CAN I REDUCE MY RISK OF HAVING A STROKE?   Stay physically active. Get at least 30 minutes of activity on most or all days.  Do not smoke. It may also be helpful to avoid exposure to secondhand smoke.  Limit alcohol use. Moderate alcohol use is considered to be:  No more than 2 drinks per day for men.  No more than 1 drink per day for nonpregnant women.  Eat healthy foods. This involves:  Eating 5 or more servings of fruits and vegetables a day.  Making dietary changes that address high blood pressure (hypertension), high cholesterol, diabetes, or obesity.  Manage your cholesterol levels.  Making food choices that are high in fiber and low in saturated fat, trans fat, and cholesterol may control cholesterol levels.  Take any prescribed medicines to control cholesterol as directed by your health care provider.  Manage your diabetes.  Controlling your carbohydrate and sugar intake is recommended to manage diabetes.  Take any prescribed medicines to control diabetes as directed by your health care provider.  Control your hypertension.  Making food choices that are low in salt (sodium), saturated fat, trans fat, and cholesterol is recommended to manage hypertension.  Ask your health care provider if you need treatment to lower your blood pressure. Take any prescribed medicines to control hypertension as directed by your health care provider.  If you are 18-39 years of age, have your blood pressure checked every 3-5 years. If you are 40 years of age or older, have your blood pressure checked every year.  Maintain a healthy weight.  Reducing calorie intake and making food choices that are low in sodium, saturated fat, trans fat, and cholesterol are recommended to manage  weight.  Stop drug abuse.  Avoid taking birth control pills.  Talk to your health care provider about the risks of taking birth control pills if you are over 35 years old, smoke, get migraines, or have ever had a blood clot.  Get evaluated for sleep disorders (sleep apnea).  Talk to your health care provider about getting a sleep evaluation if you snore a lot or have excessive sleepiness.  Take medicines only as directed by your health care provider.  For some people, aspirin or blood thinners (anticoagulants) are helpful in reducing the risk of forming abnormal blood clots that can lead to stroke. If you have the irregular heart rhythm of atrial fibrillation, you should be on a blood thinner unless there is a good reason you cannot take them.  Understand all your medicine instructions.  Make sure that other conditions (such as anemia or atherosclerosis) are addressed. SEEK IMMEDIATE MEDICAL CARE IF:   You have sudden weakness or numbness of the face, arm, or leg, especially on one side of the body.  Your face or eyelid droops to one side.  You have sudden confusion.  You have trouble speaking (aphasia) or understanding.  You have sudden trouble seeing in one or both eyes.  You have sudden trouble walking.  You have dizziness.  You have a loss of balance or coordination.  You have a sudden, severe headache with no known cause.  You have new chest pain or an irregular heartbeat. Any of these symptoms may represent a serious problem that is an emergency. Do not wait to see if the symptoms will   go away. Get medical help at once. Call your local emergency services (911 in [place] Do not drive yourself to the hospital.   This information is not intended to replace advice given to you by your health care provider. Make sure you discuss any questions you have with your health care provider.   Document Released: 08/05/2004 Document Revised: 07/19/2014 Document Reviewed:  12/29/2012 Elsevier Interactive Patient Education 2016 Elsevier Inc.  

## 2016-03-10 ENCOUNTER — Other Ambulatory Visit: Payer: Self-pay | Admitting: Family

## 2016-03-10 DIAGNOSIS — I6523 Occlusion and stenosis of bilateral carotid arteries: Secondary | ICD-10-CM

## 2016-04-10 ENCOUNTER — Other Ambulatory Visit: Payer: Self-pay | Admitting: Cardiovascular Disease

## 2016-04-21 ENCOUNTER — Other Ambulatory Visit: Payer: Self-pay | Admitting: Family Medicine

## 2016-05-31 DIAGNOSIS — H43813 Vitreous degeneration, bilateral: Secondary | ICD-10-CM | POA: Diagnosis not present

## 2016-06-16 ENCOUNTER — Other Ambulatory Visit: Payer: Self-pay | Admitting: Family Medicine

## 2016-06-17 ENCOUNTER — Other Ambulatory Visit: Payer: Self-pay

## 2016-07-07 ENCOUNTER — Encounter: Payer: Self-pay | Admitting: Family Medicine

## 2016-07-07 ENCOUNTER — Ambulatory Visit (INDEPENDENT_AMBULATORY_CARE_PROVIDER_SITE_OTHER): Payer: Medicare Other | Admitting: Family Medicine

## 2016-07-07 VITALS — BP 130/74 | HR 60 | Temp 98.1°F | Wt 189.5 lb

## 2016-07-07 DIAGNOSIS — I6523 Occlusion and stenosis of bilateral carotid arteries: Secondary | ICD-10-CM

## 2016-07-07 DIAGNOSIS — R739 Hyperglycemia, unspecified: Secondary | ICD-10-CM | POA: Diagnosis not present

## 2016-07-07 DIAGNOSIS — Z8673 Personal history of transient ischemic attack (TIA), and cerebral infarction without residual deficits: Secondary | ICD-10-CM

## 2016-07-07 DIAGNOSIS — I10 Essential (primary) hypertension: Secondary | ICD-10-CM | POA: Diagnosis not present

## 2016-07-07 DIAGNOSIS — N183 Chronic kidney disease, stage 3 unspecified: Secondary | ICD-10-CM

## 2016-07-07 DIAGNOSIS — E785 Hyperlipidemia, unspecified: Secondary | ICD-10-CM

## 2016-07-07 LAB — RENAL FUNCTION PANEL
Albumin: 4.4 g/dL (ref 3.5–5.2)
BUN: 37 mg/dL — AB (ref 6–23)
CALCIUM: 9.5 mg/dL (ref 8.4–10.5)
CHLORIDE: 106 meq/L (ref 96–112)
CO2: 27 meq/L (ref 19–32)
Creatinine, Ser: 1.73 mg/dL — ABNORMAL HIGH (ref 0.40–1.50)
GFR: 40.89 mL/min — ABNORMAL LOW (ref >60.00)
Glucose, Bld: 106 mg/dL — ABNORMAL HIGH (ref 70–99)
PHOSPHORUS: 3 mg/dL (ref 2.3–4.6)
Potassium: 4.6 mEq/L (ref 3.5–5.1)
Sodium: 143 mEq/L (ref 135–145)

## 2016-07-07 LAB — CBC WITH DIFFERENTIAL/PLATELET
BASOS ABS: 0 10*3/uL (ref 0.0–0.1)
Basophils Relative: 0.5 % (ref 0.0–3.0)
EOS ABS: 0.1 10*3/uL (ref 0.0–0.7)
Eosinophils Relative: 1.8 % (ref 0.0–5.0)
HEMATOCRIT: 35.5 % — AB (ref 39.0–52.0)
Hemoglobin: 12.2 g/dL — ABNORMAL LOW (ref 13.0–17.0)
LYMPHS PCT: 37.5 % (ref 12.0–46.0)
Lymphs Abs: 2.8 10*3/uL (ref 0.7–4.0)
MCHC: 34.3 g/dL (ref 30.0–36.0)
MCV: 90.6 fl (ref 78.0–100.0)
Monocytes Absolute: 0.4 10*3/uL (ref 0.1–1.0)
Monocytes Relative: 5.5 % (ref 3.0–12.0)
NEUTROS ABS: 4.1 10*3/uL (ref 1.4–7.7)
NEUTROS PCT: 54.7 % (ref 43.0–77.0)
PLATELETS: 270 10*3/uL (ref 150.0–400.0)
RBC: 3.92 Mil/uL — ABNORMAL LOW (ref 4.22–5.81)
RDW: 13.3 % (ref 11.5–15.5)
WBC: 7.5 10*3/uL (ref 4.0–10.5)

## 2016-07-07 LAB — HEMOGLOBIN A1C: HEMOGLOBIN A1C: 5.6 % (ref 4.6–6.5)

## 2016-07-07 LAB — LDL CHOLESTEROL, DIRECT: LDL DIRECT: 74 mg/dL

## 2016-07-07 NOTE — Assessment & Plan Note (Signed)
Chronic, stable. Continue current regimen. 

## 2016-07-07 NOTE — Assessment & Plan Note (Signed)
S/p L CEA, monitoring R stenosis yearly

## 2016-07-07 NOTE — Progress Notes (Signed)
Pre visit review using our clinic review tool, if applicable. No additional management support is needed unless otherwise documented below in the visit note. 

## 2016-07-07 NOTE — Assessment & Plan Note (Addendum)
Chronic, stable. Continue current regimen. Check d LDL as not fasting.

## 2016-07-07 NOTE — Assessment & Plan Note (Signed)
Continue plavix, aspirin

## 2016-07-07 NOTE — Progress Notes (Signed)
BP 130/74   Pulse 60   Temp 98.1 F (36.7 C) (Oral)   Wt 189 lb 8 oz (86 kg)   BMI 32.53 kg/m    CC: f/u visit Subjective:    Patient ID: Craig Avery, male    DOB: March 12, 1941, 75 y.o.   MRN: [id number]  HPI: Craig Avery is a 75 y.o. male presenting on 07/07/2016 for Follow-up   HTN - Compliant with current antihypertensive regimen of amlodipine 10mg  daily lisinopril hctz daily. Does check blood pressures at home: well controlled. No low blood pressure readings or symptoms of dizziness/syncope. Denies HA, vision changes, CP/tightness, SOB, leg swelling. Occasional foot cramping at night.   HLD - tolerating lipitor without myalgias.   CAD - on plavix and aspirin.   Relevant past medical, surgical, family and social history reviewed and updated as indicated. Interim medical history since our last visit reviewed. Allergies and medications reviewed and updated. Current Outpatient Prescriptions on File Prior to Visit  Medication Sig  . amLODipine (NORVASC) 10 MG tablet TAKE 1 TABLET BY MOUTH EVERY DAY  . aspirin 81 MG tablet Take 81 mg by mouth daily.  Marland Kitchen atorvastatin (LIPITOR) 20 MG tablet TAKE 1 TABLET BY MOUTH DAILY AT 6PM  . clopidogrel (PLAVIX) 75 MG tablet TAKE 1 TABLET BY MOUTH ONCE A DAY  . Cyanocobalamin (VITAMIN B 12 PO) Take by mouth daily.  Marland Kitchen docusate sodium (COLACE) 100 MG capsule Take 100 mg by mouth daily as needed for mild constipation. Reported on 07/31/2015  . lisinopril-hydrochlorothiazide (PRINZIDE,ZESTORETIC) 20-12.5 MG tablet TAKE 1 TABLET BY MOUTH 2 (TWO) TIMES DAILY.  . Multiple Vitamins-Minerals (CENTRUM SILVER ADULT 50+ PO) Take 1 tablet by mouth daily.   No current facility-administered medications on file prior to visit.     Review of Systems Per HPI unless specifically indicated in ROS section     Objective:    BP 130/74   Pulse 60   Temp 98.1 F (36.7 C) (Oral)   Wt 189 lb 8 oz (86 kg)   BMI 32.53 kg/m   Wt Readings from Last 3  Encounters:  07/07/16 189 lb 8 oz (86 kg)  02/23/16 192 lb (87.1 kg)  01/30/16 193 lb (87.5 kg)    Physical Exam  Constitutional: He appears well-developed and well-nourished. No distress.  HENT:  Mouth/Throat: Oropharynx is clear and moist. No oropharyngeal exudate.  Cardiovascular: Normal rate, regular rhythm, normal heart sounds and intact distal pulses.   No murmur heard. Pulmonary/Chest: Effort normal and breath sounds normal. No respiratory distress. He has no wheezes. He has no rales.  Musculoskeletal: He exhibits no edema.  Skin: Skin is warm and dry. No rash noted.  Psychiatric: He has a normal mood and affect.  Nursing note and vitals reviewed.      Assessment & Plan:   Problem List Items Addressed This Visit    Carotid stenosis, bilateral    S/p L CEA, monitoring R stenosis yearly      CKD (chronic kidney disease) stage 3, GFR 30-59 ml/min    Update Cr today.      Relevant Orders   Renal function panel   CBC with Differential/Platelet   History of ischemic right PCA stroke    Continue plavix, aspirin      HTN (hypertension) - Primary (Chronic)    Chronic, stable. Continue current regimen.       Hyperlipidemia    Chronic, stable. Continue current regimen. Check d LDL as not fasting.  Relevant Orders   LDL Cholesterol, Direct    Other Visit Diagnoses    Hyperglycemia       Relevant Orders   Hemoglobin A1c       Follow up plan: Return in about 6 months (around 01/05/2017) for follow up visit.  [name], MD

## 2016-07-07 NOTE — Patient Instructions (Addendum)
You are doing well today. Return in 6-8 months for medicare wellness visit with Katha Cabal and f/u with myself. Labs today.  Think about pneumonia shot for next time.

## 2016-07-07 NOTE — Assessment & Plan Note (Signed)
Update Cr today.  

## 2016-07-16 ENCOUNTER — Ambulatory Visit: Payer: Medicare Other

## 2016-07-20 ENCOUNTER — Ambulatory Visit: Payer: Medicare Other | Admitting: Family Medicine

## 2016-08-03 ENCOUNTER — Ambulatory Visit: Payer: Medicare Other

## 2016-08-05 ENCOUNTER — Ambulatory Visit: Payer: Medicare Other

## 2016-08-09 ENCOUNTER — Ambulatory Visit: Payer: Medicare Other | Admitting: Family Medicine

## 2016-10-17 ENCOUNTER — Other Ambulatory Visit: Payer: Self-pay | Admitting: Family Medicine

## 2016-11-18 DIAGNOSIS — X32XXXA Exposure to sunlight, initial encounter: Secondary | ICD-10-CM | POA: Diagnosis not present

## 2016-11-18 DIAGNOSIS — D485 Neoplasm of uncertain behavior of skin: Secondary | ICD-10-CM | POA: Diagnosis not present

## 2016-11-18 DIAGNOSIS — L570 Actinic keratosis: Secondary | ICD-10-CM | POA: Diagnosis not present

## 2016-11-18 DIAGNOSIS — C44319 Basal cell carcinoma of skin of other parts of face: Secondary | ICD-10-CM | POA: Diagnosis not present

## 2016-11-18 DIAGNOSIS — L821 Other seborrheic keratosis: Secondary | ICD-10-CM | POA: Diagnosis not present

## 2016-12-30 DIAGNOSIS — L814 Other melanin hyperpigmentation: Secondary | ICD-10-CM | POA: Diagnosis not present

## 2016-12-30 DIAGNOSIS — L908 Other atrophic disorders of skin: Secondary | ICD-10-CM | POA: Diagnosis not present

## 2016-12-30 DIAGNOSIS — C44310 Basal cell carcinoma of skin of unspecified parts of face: Secondary | ICD-10-CM | POA: Insufficient documentation

## 2016-12-30 DIAGNOSIS — L578 Other skin changes due to chronic exposure to nonionizing radiation: Secondary | ICD-10-CM | POA: Diagnosis not present

## 2016-12-30 DIAGNOSIS — C44319 Basal cell carcinoma of skin of other parts of face: Secondary | ICD-10-CM | POA: Diagnosis not present

## 2016-12-30 HISTORY — PX: MELANOMA EXCISION: SHX5266

## 2017-01-02 ENCOUNTER — Other Ambulatory Visit: Payer: Self-pay | Admitting: Family Medicine

## 2017-01-02 DIAGNOSIS — N183 Chronic kidney disease, stage 3 unspecified: Secondary | ICD-10-CM

## 2017-01-02 DIAGNOSIS — E785 Hyperlipidemia, unspecified: Secondary | ICD-10-CM

## 2017-01-04 ENCOUNTER — Encounter: Payer: Self-pay | Admitting: Family Medicine

## 2017-01-04 ENCOUNTER — Ambulatory Visit (INDEPENDENT_AMBULATORY_CARE_PROVIDER_SITE_OTHER): Payer: Medicare Other | Admitting: Family Medicine

## 2017-01-04 ENCOUNTER — Ambulatory Visit (INDEPENDENT_AMBULATORY_CARE_PROVIDER_SITE_OTHER): Payer: Medicare Other

## 2017-01-04 VITALS — BP 132/76 | HR 55 | Temp 98.1°F | Ht 64.0 in | Wt 192.2 lb

## 2017-01-04 DIAGNOSIS — G459 Transient cerebral ischemic attack, unspecified: Secondary | ICD-10-CM

## 2017-01-04 DIAGNOSIS — N183 Chronic kidney disease, stage 3 unspecified: Secondary | ICD-10-CM

## 2017-01-04 DIAGNOSIS — Z0000 Encounter for general adult medical examination without abnormal findings: Secondary | ICD-10-CM | POA: Diagnosis not present

## 2017-01-04 DIAGNOSIS — I10 Essential (primary) hypertension: Secondary | ICD-10-CM

## 2017-01-04 DIAGNOSIS — Z7189 Other specified counseling: Secondary | ICD-10-CM

## 2017-01-04 DIAGNOSIS — I6523 Occlusion and stenosis of bilateral carotid arteries: Secondary | ICD-10-CM

## 2017-01-04 DIAGNOSIS — E669 Obesity, unspecified: Secondary | ICD-10-CM

## 2017-01-04 DIAGNOSIS — E785 Hyperlipidemia, unspecified: Secondary | ICD-10-CM | POA: Diagnosis not present

## 2017-01-04 NOTE — Assessment & Plan Note (Signed)
Update labs on Friday.

## 2017-01-04 NOTE — Progress Notes (Signed)
Pre visit review using our clinic review tool, if applicable. No additional management support is needed unless otherwise documented below in the visit note. 

## 2017-01-04 NOTE — Progress Notes (Signed)
Subjective:   Craig Avery is a 76 y.o. male who presents for Medicare Annual/Subsequent preventive examination.  Review of Systems:  N/A Cardiac Risk Factors include: advanced age (>23men, >24 women);dyslipidemia;hypertension;male gender;obesity (BMI >30kg/m2)     Objective:    Vitals: BP 132/76 (BP Location: Right Arm, Patient Position: Sitting, Cuff Size: Normal)   Pulse (!) 55   Temp 98.1 F (36.7 C) (Oral)   Ht 5\' 4"  (1.626 m) Comment: shoes  Wt 192 lb 4 oz (87.2 kg)   SpO2 97%   BMI 33.00 kg/m   Body mass index is 33 kg/m.  Tobacco History  Smoking Status  . Former Smoker  . Packs/day: 1.50  . Years: 33.00  . Types: Cigarettes  . Quit date: 06/18/1994  Smokeless Tobacco  . Never Used     Counseling given: No   Past Medical History:  Diagnosis Date  . Carotid stenosis 01/2013   bilateral, s/p L CEA, plan rpt [place] qyear  . History of chicken pox   . HLD (hyperlipidemia)   . HTN (hypertension)   . 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 Obesity   . Stroke [facility]) 05/2012   R PCA territory, presented with L paresthesias   Past Surgical History:  Procedure Laterality Date  . CAROTID ENDARTERECTOMY Left 07-21-12   cea  . CATARACT EXTRACTION  2004   bilateral  . COLONOSCOPY WITH PROPOFOL N/A 06/24/2015   3 polyps, one with high grade dysplasia, rpt 3 mo; [name], MD  . COLONOSCOPY WITH PROPOFOL N/A 10/14/2015   hyperplastic polyp; f/u PRN; [name], MD  . ENDARTERECTOMY  07/21/2012   Left CEA; Surgeon: [name], MD  . EYE SURGERY    . MELANOMA EXCISION  12/30/2016   right side of head above ear  . PATCH ANGIOPLASTY  07/21/2012   Procedure: PATCH ANGIOPLASTY;  Surgeon: [name], MD;  Location: [facility] OR;  Service: Vascular;  Laterality: Left;  Using 1 cm x 6 cm Vascu Guard patch   . TEE WITHOUT CARDIOVERSION  05/22/2012   Procedure: TRANSESOPHAGEAL ECHOCARDIOGRAM (TEE);   Surgeon: [name], MD;  Location: [facility] ENDOSCOPY;  Service: Cardiovascular;  Laterality: N/A;  . WISDOM TOOTH EXTRACTION     Family History  Problem Relation Age of Onset  . CAD Brother 49       MI  . Heart disease Brother        before age 24  . Heart attack Brother   . Hypertension Mother   . Cirrhosis Father 46       EtOHic  . Aneurysm Paternal Uncle 74  . Heart disease Daughter   . Hyperlipidemia Daughter    History  Sexual Activity  . Sexual activity: Yes    Outpatient Encounter Prescriptions as of 01/04/2017  Medication Sig  . amLODipine (NORVASC) 10 MG tablet TAKE 1 TABLET BY MOUTH EVERY DAY  . aspirin 81 MG tablet Take 81 mg by mouth daily.  Marland Kitchen atorvastatin (LIPITOR) 20 MG tablet TAKE 1 TABLET BY MOUTH DAILY AT 6PM  . clopidogrel (PLAVIX) 75 MG tablet TAKE 1 TABLET BY MOUTH ONCE A DAY  . Cyanocobalamin (VITAMIN B 12 PO) Take by mouth daily.  Marland Kitchen docusate sodium (COLACE) 100 MG capsule Take 100 mg by mouth daily as needed for mild constipation. Reported on 07/31/2015  . lisinopril-hydrochlorothiazide (PRINZIDE,ZESTORETIC) 20-12.5 MG tablet TAKE 1 TABLET BY MOUTH 2 (TWO)  TIMES DAILY.  . Multiple Vitamins-Minerals (CENTRUM SILVER ADULT 50+ PO) Take 1 tablet by mouth daily.   No facility-administered encounter medications on file as of 01/04/2017.     Activities of Daily Living In your present state of health, do you have any difficulty performing the following activities: 01/04/2017  Hearing? N  Vision? N  Difficulty concentrating or making decisions? N  Walking or climbing stairs? N  Dressing or bathing? N  Doing errands, shopping? N  Preparing Food and eating ? N  Using the Toilet? N  In the past six months, have you accidently leaked urine? N  Do you have problems with loss of bowel control? N  Managing your Medications? N  Managing your Finances? N  Housekeeping or managing your Housekeeping? N  Some recent data might be hidden    Patient Care  Team: [name], MD as PCP - General (Family Medicine) Minna Merritts, MD as Attending Physician (Cardiology)   Assessment:     Hearing Screening   125Hz  250Hz  500Hz  1000Hz  2000Hz  3000Hz  4000Hz  6000Hz  8000Hz   Right ear:   40 40 40  0    Left ear:   40 40 0  0    Vision Screening Comments: Last vision in July 2017 @ [facility]   Exercise Activities and Dietary recommendations Current Exercise Habits: The patient does not participate in regular exercise at present (pt has a wood shop avg 1-2 hours daily and does mowing as needed), Exercise limited by: None identified  Goals    . Increase water intake          Starting 01/04/2017, I will attempt to drink at least 8 oz of water with each meal.       Fall Risk Fall Risk  01/04/2017 06/17/2016 05/23/2015 03/25/2014 06/04/2012  Falls in the past year? No No No No No   Depression Screen PHQ 2/9 Scores 01/04/2017 05/23/2015 03/25/2014 06/04/2012  PHQ - 2 Score 0 0 0 0    Cognitive Function MMSE - Mini Mental State Exam 01/04/2017  Orientation to time 5  Orientation to Place 5  Registration 3  Attention/ Calculation 0  Recall 3  Language- name 2 objects 0  Language- repeat 1  Language- follow 3 step command 3  Language- read & follow direction 0  Write a sentence 0  Copy design 0  Total score 20     PLEASE NOTE: A Mini-Cog screen was completed. Maximum score is 20. A value of 0 denotes this part of Folstein MMSE was not completed or the patient failed this part of the Mini-Cog screening.   Mini-Cog Screening Orientation to Time - Max 5 pts Orientation to Place - Max 5 pts Registration - Max 3 pts Recall - Max 3 pts Language Repeat - Max 1 pts Language Follow 3 Step Command - Max 3 pts     Immunization History  Administered Date(s) Administered  . Influenza-Unspecified 03/12/2014  . Tdap 11/16/2014   Screening Tests Health Maintenance  Topic Date Due  . PNA vac Low Risk Adult (1 of 2 - PCV13)  01/04/2049 (Originally 04/11/2004)  . INFLUENZA VACCINE  02/09/2017  . DTaP/Tdap/Td (2 - Td) 11/15/2024  . TETANUS/TDAP  11/15/2024      Plan:     I have personally reviewed and addressed the Medicare Annual Wellness questionnaire and have noted the following in the patient's chart:  A. Medical and social history B. Use of alcohol, tobacco or illicit drugs  C. Current  medications and supplements D. Functional ability and status E.  Nutritional status F.  Physical activity G. Advance directives H. List of other physicians I.  Hospitalizations, surgeries, and ER visits in previous 12 months J.  [place] to include hearing, vision, cognitive, depression L. Referrals and appointments - none  In addition, I have reviewed and discussed with patient certain preventive protocols, quality metrics, and best practice recommendations. A written personalized care plan for preventive services as well as general preventive health recommendations were provided to patient.  See attached scanned questionnaire for additional information.   Signed,   Lindell Noe, MHA, BS, LPN Health Coach

## 2017-01-04 NOTE — Assessment & Plan Note (Signed)
S/p L CEA. R monitoring by [facility] pending rpt [place] 02/2017.

## 2017-01-04 NOTE — Assessment & Plan Note (Signed)
Discussed healthy diet and lifestyle changes to affect sustainable weight loss  

## 2017-01-04 NOTE — Assessment & Plan Note (Addendum)
No recurrent sxs. Continue aspirin, plavix.

## 2017-01-04 NOTE — Patient Instructions (Signed)
Mr. Carriger , Thank you for taking time to come for your Medicare Wellness Visit. I appreciate your ongoing commitment to your health goals. Please review the following plan we discussed and let me know if I can assist you in the future.   These are the goals we discussed: Goals    . Increase water intake          Starting 01/04/2017, I will attempt to drink at least 8 oz of water with each meal.        This is a list of the screening recommended for you and due dates:  Health Maintenance  Topic Date Due  . Pneumonia vaccines (1 of 2 - PCV13) 01/04/2049*  . Flu Shot  02/09/2017  . DTaP/Tdap/Td vaccine (2 - Td) 11/15/2024  . Tetanus Vaccine  11/15/2024  *Topic was postponed. The date shown is not the original due date.   Preventive Care for Adults  A healthy lifestyle and preventive care can promote health and wellness. Preventive health guidelines for adults include the following key practices.  . A routine yearly physical is a good way to check with your health care provider about your health and preventive screening. It is a chance to share any concerns and updates on your health and to receive a thorough exam.  . Visit your dentist for a routine exam and preventive care every 6 months. Brush your teeth twice a day and floss once a day. Good oral hygiene prevents tooth decay and gum disease.  . The frequency of eye exams is based on your age, health, family medical history, use  of contact lenses, and other factors. Follow your health care provider's ecommendations for frequency of eye exams.  . Eat a healthy diet. Foods like vegetables, fruits, whole grains, low-fat dairy products, and lean protein foods contain the nutrients you need without too many calories. Decrease your intake of foods high in solid fats, added sugars, and salt. Eat the right amount of calories for you. Get information about a proper diet from your health care provider, if necessary.  . Regular physical  exercise is one of the most important things you can do for your health. Most adults should get at least 150 minutes of moderate-intensity exercise (any activity that increases your heart rate and causes you to sweat) each week. In addition, most adults need muscle-strengthening exercises on 2 or more days a week.  Silver Sneakers may be a benefit available to you. To determine eligibility, you may visit the website: www.silversneakers.com or contact program at (226)245-8888 Mon-Fri between 8AM-8PM.   . Maintain a healthy weight. The body mass index (BMI) is a screening tool to identify possible weight problems. It provides an estimate of body fat based on height and weight. Your health care provider can find your BMI and can help you achieve or maintain a healthy weight.   For adults 20 years and older: ? A BMI below 18.5 is considered underweight. ? A BMI of 18.5 to 24.9 is normal. ? A BMI of 25 to 29.9 is considered overweight. ? A BMI of 30 and above is considered obese.   . Maintain normal blood lipids and cholesterol levels by exercising and minimizing your intake of saturated fat. Eat a balanced diet with plenty of fruit and vegetables. Blood tests for lipids and cholesterol should begin at age 52 and be repeated every 5 years. If your lipid or cholesterol levels are high, you are over 50, or you are at high  risk for heart disease, you may need your cholesterol levels checked more frequently. Ongoing high lipid and cholesterol levels should be treated with medicines if diet and exercise are not working.  . If you smoke, find out from your health care provider how to quit. If you do not use tobacco, please do not start.  . If you choose to drink alcohol, please do not consume more than 2 drinks per day. One drink is considered to be 12 ounces (355 mL) of beer, 5 ounces (148 mL) of wine, or 1.5 ounces (44 mL) of liquor.  . If you are 64-51 years old, ask your health care provider if you  should take aspirin to prevent strokes.  . Use sunscreen. Apply sunscreen liberally and repeatedly throughout the day. You should seek shade when your shadow is shorter than you. Protect yourself by wearing long sleeves, pants, a wide-brimmed hat, and sunglasses year round, whenever you are outdoors.  . Once a month, do a whole body skin exam, using a mirror to look at the skin on your back. Tell your health care provider of new moles, moles that have irregular borders, moles that are larger than a pencil eraser, or moles that have changed in shape or color.

## 2017-01-04 NOTE — Progress Notes (Signed)
BP 132/76   Pulse (!) 55   Temp 98.1 F (36.7 C)   Ht 5\' 4"  (1.626 m)   Wt 192 lb 4 oz (87.2 kg)   SpO2 97%   BMI 33.00 kg/m    CC: f/u visit after AMW Subjective:    Patient ID: Craig Avery, male    DOB: November 07, 1940, 76 y.o.   MRN: [id number]  HPI: Craig Avery is a 76 y.o. male presenting on 01/04/2017 for Annual Exam (Part 2)   Saw Katha Cabal today for medicare wellness visit. Note reviewed.    Preventative: COLONOSCOPY WITH PROPOFOL 06/24/2015 3 polyps, one with high grade dysplasia, rpt 3 mo; [name], MD COLONOSCOPY WITH PROPOFOL 10/14/2015 hyperplastic polyp; f/u PRN; [name], MD Prostate cancer screening - aged out Needle phobia Flu shot - declines  Pneumovax - discussed, declines  Tdap 11/2014 zostavax - declines Advanced directives - does not want prolonged life support if irreversible damage. Would want wife and daughters to be HCPOA. Packet provided today. Seat belt use discussed. Sunscreen use discussed. Recent BCC removed R scalp at [facility] Sees derm.  Ex smoker - quit 1995 Alcohol - 1-2 drinks/day  Caffeine: light coffee in am, working on cutting back Lives with wife, 1 dog. Daughter next door. Other children nearby. 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 Interim medical history since our last visit reviewed. Allergies and medications reviewed and updated. Outpatient Medications Prior to Visit  Medication Sig Dispense Refill  . amLODipine (NORVASC) 10 MG tablet TAKE 1 TABLET BY MOUTH EVERY DAY 90 tablet 1  . aspirin 81 MG tablet Take 81 mg by mouth daily.    Marland Kitchen atorvastatin (LIPITOR) 20 MG tablet TAKE 1 TABLET BY MOUTH DAILY AT 6PM 90 tablet 3  . clopidogrel (PLAVIX) 75 MG tablet TAKE 1 TABLET BY MOUTH ONCE A DAY 90  tablet 3  . Cyanocobalamin (VITAMIN B 12 PO) Take by mouth daily.    Marland Kitchen docusate sodium (COLACE) 100 MG capsule Take 100 mg by mouth daily as needed for mild constipation. Reported on 07/31/2015    . lisinopril-hydrochlorothiazide (PRINZIDE,ZESTORETIC) 20-12.5 MG tablet TAKE 1 TABLET BY MOUTH 2 (TWO) TIMES DAILY. 180 tablet 3  . Multiple Vitamins-Minerals (CENTRUM SILVER ADULT 50+ PO) Take 1 tablet by mouth daily.     No facility-administered medications prior to visit.      Per HPI unless specifically indicated in ROS section below Review of Systems     Objective:    BP 132/76   Pulse (!) 55   Temp 98.1 F (36.7 C)   Ht 5\' 4"  (1.626 m)   Wt 192 lb 4 oz (87.2 kg)   SpO2 97%   BMI 33.00 kg/m   Wt Readings from Last 3 Encounters:  01/04/17 192 lb 4 oz (87.2 kg)  01/04/17 192 lb 4 oz (87.2 kg)  07/07/16 189 lb 8 oz (86 kg)    Physical Exam  Constitutional: He is oriented to person, place, and time. He appears well-developed and well-nourished. No distress.  HENT:  Head: Normocephalic and atraumatic.  Mouth/Throat: Oropharynx is clear and moist. No oropharyngeal exudate.  Eyes: Conjunctivae and EOM are normal. Pupils are equal, round, and reactive to light. No scleral icterus.  Neck: Normal range of motion. Neck supple.  Carotid bruit is present (R sided). No thyromegaly present.  Cardiovascular: Normal rate, regular rhythm, normal heart sounds and intact distal pulses.   No murmur heard. Pulmonary/Chest: Breath sounds normal. No respiratory distress. He has no wheezes. He has no rales.  Musculoskeletal: He exhibits no edema.  Lymphadenopathy:    He has no cervical adenopathy.  Neurological: He is alert and oriented to person, place, and time.  Skin: Skin is warm and dry. No rash noted.  Psychiatric: He has a normal mood and affect.  Vitals reviewed.  Results for orders placed or performed in visit on 07/07/16  Renal function panel  Result Value Ref Range   Sodium 143 135 -  145 mEq/L   Potassium 4.6 3.5 - 5.1 mEq/L   Chloride 106 96 - 112 mEq/L   CO2 27 19 - 32 mEq/L   Calcium 9.5 8.4 - 10.5 mg/dL   Albumin 4.4 3.5 - 5.2 g/dL   BUN 37 (H) 6 - 23 mg/dL   Creatinine, Ser 1.73 (H) 0.40 - 1.50 mg/dL   Glucose, Bld 106 (H) 70 - 99 mg/dL   Phosphorus 3.0 2.3 - 4.6 mg/dL   GFR 40.89 (L) >60.00 mL/min  Hemoglobin A1c  Result Value Ref Range   Hgb A1c MFr Bld 5.6 4.6 - 6.5 %  LDL Cholesterol, Direct  Result Value Ref Range   Direct LDL 74.0 mg/dL  CBC with Differential/Platelet  Result Value Ref Range   WBC 7.5 4.0 - 10.5 K/uL   RBC 3.92 (L) 4.22 - 5.81 Mil/uL   Hemoglobin 12.2 (L) 13.0 - 17.0 g/dL   HCT 35.5 (L) 39.0 - 52.0 %   MCV 90.6 78.0 - 100.0 fl   MCHC 34.3 30.0 - 36.0 g/dL   RDW 13.3 11.5 - 15.5 %   Platelets 270.0 150.0 - 400.0 K/uL   Neutrophils Relative % 54.7 43.0 - 77.0 %   Lymphocytes Relative 37.5 12.0 - 46.0 %   Monocytes Relative 5.5 3.0 - 12.0 %   Eosinophils Relative 1.8 0.0 - 5.0 %   Basophils Relative 0.5 0.0 - 3.0 %   Neutro Abs 4.1 1.4 - 7.7 K/uL   Lymphs Abs 2.8 0.7 - 4.0 K/uL   Monocytes Absolute 0.4 0.1 - 1.0 K/uL   Eosinophils Absolute 0.1 0.0 - 0.7 K/uL   Basophils Absolute 0.0 0.0 - 0.1 K/uL      Assessment & Plan:   Problem List Items Addressed This Visit    Advanced care planning/counseling discussion    Advanced directives - does not want prolonged life support if irreversible damage. Would want wife and daughters to be HCPOA. Packet provided today.      Carotid stenosis, bilateral    S/p L CEA. R monitoring by [facility] pending rpt [place] 02/2017.       CKD (chronic kidney disease) stage 3, GFR 30-59 ml/min    Update labs on Friday.       HTN (hypertension) - Primary (Chronic)    Chronic, stable. Continue current regimen.       Hyperlipidemia    Chronic, stable. Due for FLP - but he already had breakfast today. I asked him to return Friday am for fasting labs despite his needle phobia.       Obesity, Class I,  BMI 30-34.9    Discussed healthy diet and lifestyle changes to affect sustainable weight loss.       TIA (transient ischemic attack)    No recurrent sxs. Continue aspirin, plavix.  Follow up plan: Return in about 1 year (around 01/04/2018) for medicare wellness visit, follow up visit.  [name], MD

## 2017-01-04 NOTE — Patient Instructions (Signed)
You are doing well today. Return on Friday morning for lab visit only (fasting labs).  Continue current medicines. Return in 1 year for next wellness visit with Katha Cabal and follow up with me.

## 2017-01-04 NOTE — Assessment & Plan Note (Signed)
Advanced directives - does not want prolonged life support if irreversible damage. Would want wife and daughters to be HCPOA. Packet provided today.

## 2017-01-04 NOTE — Progress Notes (Signed)
PCP notes:   Health maintenance:  PNA vaccine - pt declined  Abnormal screenings:   Hearing - failed  Patient concerns:   None  Nurse concerns:  None  Next PCP appt:   01/04/17 @ 0930

## 2017-01-04 NOTE — Assessment & Plan Note (Signed)
Chronic, stable. Due for FLP - but he already had breakfast today. I asked him to return Friday am for fasting labs despite his needle phobia.

## 2017-01-04 NOTE — Assessment & Plan Note (Signed)
Chronic, stable. Continue current regimen. 

## 2017-01-05 ENCOUNTER — Ambulatory Visit: Payer: Medicare Other | Admitting: Family Medicine

## 2017-01-06 NOTE — Progress Notes (Signed)
I reviewed health advisor's note, was available for consultation, and agree with documentation and plan.  

## 2017-01-07 ENCOUNTER — Other Ambulatory Visit (INDEPENDENT_AMBULATORY_CARE_PROVIDER_SITE_OTHER): Payer: Medicare Other

## 2017-01-07 DIAGNOSIS — E785 Hyperlipidemia, unspecified: Secondary | ICD-10-CM | POA: Diagnosis not present

## 2017-01-07 DIAGNOSIS — N183 Chronic kidney disease, stage 3 unspecified: Secondary | ICD-10-CM

## 2017-01-07 LAB — LIPID PANEL
CHOL/HDL RATIO: 3
Cholesterol: 123 mg/dL (ref 0–200)
HDL: 44.2 mg/dL (ref >39.00)
LDL CALC: 63 mg/dL (ref 0–99)
NONHDL: 79.26
Triglycerides: 81 mg/dL (ref 0.0–149.0)
VLDL: 16.2 mg/dL (ref 0.0–40.0)

## 2017-01-07 LAB — COMPREHENSIVE METABOLIC PANEL
ALT: 23 U/L (ref 0–53)
AST: 19 U/L (ref 0–37)
Albumin: 4.2 g/dL (ref 3.5–5.2)
Alkaline Phosphatase: 45 U/L (ref 39–117)
BUN: 47 mg/dL — ABNORMAL HIGH (ref 6–23)
CHLORIDE: 108 meq/L (ref 96–112)
CO2: 28 meq/L (ref 19–32)
Calcium: 9.3 mg/dL (ref 8.4–10.5)
Creatinine, Ser: 2.09 mg/dL — ABNORMAL HIGH (ref 0.40–1.50)
GFR: 32.83 mL/min — ABNORMAL LOW (ref >60.00)
GLUCOSE: 116 mg/dL — AB (ref 70–99)
Potassium: 4.3 mEq/L (ref 3.5–5.1)
Sodium: 143 mEq/L (ref 135–145)
Total Bilirubin: 0.5 mg/dL (ref 0.2–1.2)
Total Protein: 7.2 g/dL (ref 6.0–8.3)

## 2017-01-07 LAB — CBC WITH DIFFERENTIAL/PLATELET
BASOS PCT: 0.7 % (ref 0.0–3.0)
Basophils Absolute: 0.1 10*3/uL (ref 0.0–0.1)
EOS ABS: 0.1 10*3/uL (ref 0.0–0.7)
Eosinophils Relative: 1.5 % (ref 0.0–5.0)
HCT: 34.6 % — ABNORMAL LOW (ref 39.0–52.0)
Hemoglobin: 12 g/dL — ABNORMAL LOW (ref 13.0–17.0)
Lymphocytes Relative: 54.9 % — ABNORMAL HIGH (ref 12.0–46.0)
Lymphs Abs: 5 10*3/uL — ABNORMAL HIGH (ref 0.7–4.0)
MCHC: 34.7 g/dL (ref 30.0–36.0)
MCV: 91.2 fl (ref 78.0–100.0)
MONO ABS: 0.5 10*3/uL (ref 0.1–1.0)
Monocytes Relative: 6 % (ref 3.0–12.0)
Neutro Abs: 3.3 10*3/uL (ref 1.4–7.7)
Neutrophils Relative %: 36.9 % — ABNORMAL LOW (ref 43.0–77.0)
Platelets: 241 10*3/uL (ref 150.0–400.0)
RBC: 3.8 Mil/uL — ABNORMAL LOW (ref 4.22–5.81)
RDW: 13.3 % (ref 11.5–15.5)
WBC: 9 10*3/uL (ref 4.0–10.5)

## 2017-01-07 LAB — TSH: TSH: 3.14 u[IU]/mL (ref 0.35–4.50)

## 2017-01-09 ENCOUNTER — Other Ambulatory Visit: Payer: Self-pay | Admitting: Family Medicine

## 2017-01-09 DIAGNOSIS — N183 Chronic kidney disease, stage 3 unspecified: Secondary | ICD-10-CM

## 2017-01-09 MED ORDER — LISINOPRIL-HYDROCHLOROTHIAZIDE 20-12.5 MG PO TABS: 1.0000 | ORAL_TABLET | Freq: Every day | ORAL | 3 refills | Status: DC

## 2017-01-23 ENCOUNTER — Other Ambulatory Visit: Payer: Self-pay | Admitting: Cardiovascular Disease

## 2017-02-10 ENCOUNTER — Other Ambulatory Visit (INDEPENDENT_AMBULATORY_CARE_PROVIDER_SITE_OTHER): Payer: Medicare Other

## 2017-02-10 DIAGNOSIS — N183 Chronic kidney disease, stage 3 unspecified: Secondary | ICD-10-CM

## 2017-02-10 LAB — RENAL FUNCTION PANEL
ALBUMIN: 4.1 g/dL (ref 3.5–5.2)
BUN: 30 mg/dL — AB (ref 6–23)
CALCIUM: 9.3 mg/dL (ref 8.4–10.5)
CHLORIDE: 106 meq/L (ref 96–112)
CO2: 29 meq/L (ref 19–32)
Creatinine, Ser: 1.59 mg/dL — ABNORMAL HIGH (ref 0.40–1.50)
GFR: 45 mL/min — ABNORMAL LOW (ref >60.00)
Glucose, Bld: 114 mg/dL — ABNORMAL HIGH (ref 70–99)
POTASSIUM: 4.4 meq/L (ref 3.5–5.1)
Phosphorus: 3.2 mg/dL (ref 2.3–4.6)
SODIUM: 141 meq/L (ref 135–145)

## 2017-02-23 ENCOUNTER — Encounter: Payer: Self-pay | Admitting: Family

## 2017-02-28 ENCOUNTER — Ambulatory Visit: Payer: Medicare Other | Admitting: Family

## 2017-02-28 ENCOUNTER — Ambulatory Visit (HOSPITAL_COMMUNITY)
Admission: RE | Admit: 2017-02-28 | Discharge: 2017-02-28 | Disposition: A | Discharge status: Home / Self Care | Payer: Medicare Other | Source: Ambulatory Visit | Attending: Family | Admitting: Family

## 2017-02-28 DIAGNOSIS — I6523 Occlusion and stenosis of bilateral carotid arteries: Secondary | ICD-10-CM | POA: Diagnosis not present

## 2017-02-28 LAB — VAS US CAROTID
LCCAPDIAS: 15 cm/s
LCCAPSYS: 101 cm/s
LEFT ECA DIAS: -11 cm/s
Left CCA dist dias: 15 cm/s
Left CCA dist sys: 57 cm/s
Left ICA dist dias: -27 cm/s
Left ICA dist sys: -94 cm/s
Left ICA prox dias: -30 cm/s
Left ICA prox sys: -100 cm/s
RCCADSYS: -88 cm/s
RCCAPDIAS: 13 cm/s
RCCAPSYS: 79 cm/s
RIGHT CCA MID DIAS: 17 cm/s
RIGHT ECA DIAS: -22 cm/s

## 2017-03-07 ENCOUNTER — Ambulatory Visit (INDEPENDENT_AMBULATORY_CARE_PROVIDER_SITE_OTHER): Payer: Medicare Other | Admitting: Family

## 2017-03-07 ENCOUNTER — Encounter: Payer: Self-pay | Admitting: Family

## 2017-03-07 VITALS — BP 146/64 | HR 52 | Temp 98.1°F | Resp 20 | Ht 64.0 in | Wt 186.0 lb

## 2017-03-07 DIAGNOSIS — I6523 Occlusion and stenosis of bilateral carotid arteries: Secondary | ICD-10-CM

## 2017-03-07 DIAGNOSIS — Z9889 Other specified postprocedural states: Secondary | ICD-10-CM | POA: Diagnosis not present

## 2017-03-07 DIAGNOSIS — I6522 Occlusion and stenosis of left carotid artery: Secondary | ICD-10-CM

## 2017-03-07 DIAGNOSIS — Z87891 Personal history of nicotine dependence: Secondary | ICD-10-CM

## 2017-03-07 NOTE — Patient Instructions (Signed)
Stroke Prevention Some medical conditions and behaviors are associated with an increased chance of having a stroke. You may prevent a stroke by making healthy choices and managing medical conditions. How can I reduce my risk of having a stroke?  Stay physically active. Get at least 30 minutes of activity on most or all days.  Do not smoke. It may also be helpful to avoid exposure to secondhand smoke.  Limit alcohol use. Moderate alcohol use is considered to be:  No more than 2 drinks per day for men.  No more than 1 drink per day for nonpregnant women.  Eat healthy foods. This involves:  Eating 5 or more servings of fruits and vegetables a day.  Making dietary changes that address high blood pressure (hypertension), high cholesterol, diabetes, or obesity.  Manage your cholesterol levels.  Making food choices that are high in fiber and low in saturated fat, trans fat, and cholesterol may control cholesterol levels.  Take any prescribed medicines to control cholesterol as directed by your health care provider.  Manage your diabetes.  Controlling your carbohydrate and sugar intake is recommended to manage diabetes.  Take any prescribed medicines to control diabetes as directed by your health care provider.  Control your hypertension.  Making food choices that are low in salt (sodium), saturated fat, trans fat, and cholesterol is recommended to manage hypertension.  Ask your health care provider if you need treatment to lower your blood pressure. Take any prescribed medicines to control hypertension as directed by your health care provider.  If you are 18-39 years of age, have your blood pressure checked every 3-5 years. If you are 40 years of age or older, have your blood pressure checked every year.  Maintain a healthy weight.  Reducing calorie intake and making food choices that are low in sodium, saturated fat, trans fat, and cholesterol are recommended to manage  weight.  Stop drug abuse.  Avoid taking birth control pills.  Talk to your health care provider about the risks of taking birth control pills if you are over 35 years old, smoke, get migraines, or have ever had a blood clot.  Get evaluated for sleep disorders (sleep apnea).  Talk to your health care provider about getting a sleep evaluation if you snore a lot or have excessive sleepiness.  Take medicines only as directed by your health care provider.  For some people, aspirin or blood thinners (anticoagulants) are helpful in reducing the risk of forming abnormal blood clots that can lead to stroke. If you have the irregular heart rhythm of atrial fibrillation, you should be on a blood thinner unless there is a good reason you cannot take them.  Understand all your medicine instructions.  Make sure that other conditions (such as anemia or atherosclerosis) are addressed. Get help right away if:  You have sudden weakness or numbness of the face, arm, or leg, especially on one side of the body.  Your face or eyelid droops to one side.  You have sudden confusion.  You have trouble speaking (aphasia) or understanding.  You have sudden trouble seeing in one or both eyes.  You have sudden trouble walking.  You have dizziness.  You have a loss of balance or coordination.  You have a sudden, severe headache with no known cause.  You have new chest pain or an irregular heartbeat. Any of these symptoms may represent a serious problem that is an emergency. Do not wait to see if the symptoms will go away.   Get medical help at once. Call your local emergency services (911 in [place] Do not drive yourself to the hospital. This information is not intended to replace advice given to you by your health care provider. Make sure you discuss any questions you have with your health care provider. Document Released: 08/05/2004 Document Revised: 12/04/2015 Document Reviewed: 12/29/2012 Elsevier  Interactive Patient Education  2017 [employer]  

## 2017-03-07 NOTE — Progress Notes (Signed)
Chief Complaint: Follow up Extracranial Carotid Artery Stenosis   History of Present Illness  Craig Avery is a 76 y.o. male who is status post left carotid endarterectomy on 07/21/2012 by Dr. Trula Slade. Intraoperative findings included 90% stenosis. He returns today for follow up.   He has a history of right brain stroke as manifested by tingling on the left side of his body and severe hypertension in 2014, just before the left CEA. He has not had any symptoms since we started him on maximal medical therapy.  He has no significant right extracranial carotid artery stenosis. He continues to be a nonsmoker. He is medically managed with dual antiplatelet therapy. He is on a statin for hypercholesterolemia.  He denies any history of amaurosis fugax or aphasia. His right hip joint hurts if he does much walking, denies non healing wounds. He states his grandmother did not smoke, did not have DM, (did not have these atherosclerotic risk factors) and had both legs amputated due to poor circulation.  Pt Diabetic: No Pt smoker: former smoker, quit in 1995  Pt meds include: Statin : Yes ASA: Yes Other anticoagulants/antiplatelets: Plavix   Past Medical History:  Diagnosis Date  . Carotid stenosis 01/2013   bilateral, s/p L CEA, plan rpt [place] qyear  . History of chicken pox   . HLD (hyperlipidemia)   . HTN (hypertension)   . 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 Obesity   . Stroke [facility]) 05/2012   R PCA territory, presented with L paresthesias    Social History Social History  Substance Use Topics  . Smoking status: Former Smoker    Packs/day: 1.50    Years: 33.00    Types: Cigarettes    Quit date: 06/18/1994  . Smokeless tobacco: Never Used  . Alcohol use 6.0 - 7.2 oz/week    10 - 12 Cans of beer per week     Comment: Consumes 5 beers per week    Family History Family History  Problem Relation Age of  Onset  . CAD Brother 62       MI  . Heart disease Brother        before age 69  . Heart attack Brother   . Hypertension Mother   . Cirrhosis Father 53       EtOHic  . Aneurysm Paternal Uncle 19  . Heart disease Daughter   . Hyperlipidemia Daughter     Surgical History Past Surgical History:  Procedure Laterality Date  . CAROTID ENDARTERECTOMY Left 07-21-12   cea  . CATARACT EXTRACTION  2004   bilateral  . COLONOSCOPY WITH PROPOFOL N/A 06/24/2015   3 polyps, one with high grade dysplasia, rpt 3 mo; [name], MD  . COLONOSCOPY WITH PROPOFOL N/A 10/14/2015   hyperplastic polyp; f/u PRN; [name], MD  . ENDARTERECTOMY  07/21/2012   Left CEA; Surgeon: [name], MD  . EYE SURGERY    . MELANOMA EXCISION  12/30/2016   right side of head above ear  . PATCH ANGIOPLASTY  07/21/2012   Procedure: PATCH ANGIOPLASTY;  Surgeon: [name], MD;  Location: [facility] OR;  Service: Vascular;  Laterality: Left;  Using 1 cm x 6 cm Vascu Guard patch   . TEE WITHOUT CARDIOVERSION  05/22/2012   Procedure: TRANSESOPHAGEAL ECHOCARDIOGRAM (TEE);  Surgeon: [name], MD;  Location: [facility];  Service: Cardiovascular;  Laterality: N/A;  . WISDOM  TOOTH EXTRACTION      No Known Allergies  Current Outpatient Prescriptions  Medication Sig Dispense Refill  . amLODipine (NORVASC) 10 MG tablet TAKE 1 TABLET BY MOUTH EVERY DAY 90 tablet 1  . aspirin 81 MG tablet Take 81 mg by mouth daily.    Marland Kitchen atorvastatin (LIPITOR) 20 MG tablet TAKE 1 TABLET BY MOUTH DAILY AT 6PM 90 tablet 3  . atorvastatin (LIPITOR) 20 MG tablet TAKE 1 TABLET BY MOUTH DAILY AT 6PM 90 tablet 3  . clopidogrel (PLAVIX) 75 MG tablet TAKE 1 TABLET BY MOUTH ONCE A DAY 90 tablet 3  . Cyanocobalamin (VITAMIN B 12 PO) Take by mouth daily.    Marland Kitchen docusate sodium (COLACE) 100 MG capsule Take 100 mg by mouth daily as needed for mild constipation. Reported on 07/31/2015    . lisinopril-hydrochlorothiazide (PRINZIDE,ZESTORETIC) 20-12.5  MG tablet Take 1 tablet by mouth daily. 90 tablet 3  . Multiple Vitamins-Minerals (CENTRUM SILVER ADULT 50+ PO) Take 1 tablet by mouth daily.     No current facility-administered medications for this visit.     Review of Systems : See HPI for pertinent positives and negatives.  Physical Examination  Vitals:   03/07/17 1057 03/07/17 1103  BP: (!) 149/71 (!) 146/64  Pulse: (!) 52   Resp: 20   Temp: 98.1 F (36.7 C)   TempSrc: Oral   SpO2: 97%   Weight: 186 lb (84.4 kg)   Height: 5\' 4"  (1.626 m)    Body mass index is 31.93 kg/m.  General: WDWN obese male in NAD GAIT:normal Eyes: PERRLA Pulmonary: Respirations are non-labored, CTAB, good air movement in all fields.  Cardiac: regular rhythm, no detected murmur.  VASCULAR EXAM Carotid Bruits Right Left   positive Negative   Abdominal aortic pulse is not palpable.  Radial pulses are 3+ palpable and equal.      LE Pulses Right Left   POPLITEAL not palpable  not palpable   POSTERIOR TIBIAL 2+ palpable  not palpable    DORSALIS PEDIS  ANTERIOR TIBIAL not palpable  not palpable     Gastrointestinal: soft, nontender, BS WNL, no r/g,no palpable masses.  Musculoskeletal: No muscle atrophy/wasting. M/S 5/5 throughout, Extremities without ischemic changes. Feet appear well perfused, no wounds, no edema in lower extremities.   Neurologic: A&O X 3; Appropriate Affect,speech is normal, CN 2-12 intact, Pain and light touch intact in extremities, Motor exam as listed above.     Assessment: Craig Avery is a 76 y.o. male who is status post left carotid endarterectomy on 07/21/2012. He has a history of right brain stroke as manifested by tingling on the left side of his body and severe hypertension in 2014, just before the left CEA; no subsequent  TIA or stroke.  DATA Carotid Duplex (02-28-17): 1-39% right internal carotid artery stenosis. Left carotid endarterectomy site with no evidence for restenosis. Right ECA stenosis.  Bilateral vertebral artery flow is antegrade.  Bilateral subclavian artery waveforms are normal.  No change since exams of 02/17/2015 and 02/23/16.  Plan: Follow-up in 18 months with Carotid Duplex scan.    I discussed in depth with the patient the nature of atherosclerosis, and emphasized the importance of maximal medical management including strict control of blood pressure, blood glucose, and lipid levels, obtaining regular exercise, and continued cessation of smoking.  The patient is aware that without maximal medical management the underlying atherosclerotic disease process will progress, limiting the benefit of any interventions. The patient was given information about  stroke prevention and what symptoms should prompt the patient to seek immediate medical care. Thank you for allowing [place] to participate in this patient's care.  Clemon Chambers, RN, MSN, FNP-C [facility] Office: [place]: Trula Slade  03/07/17 11:15 AM

## 2017-03-16 NOTE — Addendum Note (Signed)
Addended by: Lianne Cure A on: 03/16/2017 11:03 AM   Modules accepted: Orders

## 2017-04-18 ENCOUNTER — Other Ambulatory Visit: Payer: Self-pay | Admitting: Family Medicine

## 2017-04-27 DIAGNOSIS — Z87891 Personal history of nicotine dependence: Secondary | ICD-10-CM | POA: Insufficient documentation

## 2017-04-27 NOTE — Progress Notes (Signed)
Patient ID: Craig Avery, male   DOB: 12/18/1940, 76 y.o.   MRN: [id number] Cardiology Office Note  Date:  04/28/2017   ID:  Craig Avery, DOB March 14, 1941, MRN [id number]  PCP:  [name], MD   Chief Complaint  Patient presents with  . OTHER    1 yr f/u c/o sob and giving out easily. Meds reviewed verbally with pt.    HPI:  76 year-old gentleman with history of  hypertension  peripheral vascular disease, Carotid endarterectomy on the left July 2014,  smoking history ,  recent hospitalization x2 at [facility], L sided paresthesias, Found to have stroke (acute right PCA distribution ischemic infarction involving the right occipital region and right thalamus along with severe R PCA), returned a few days later to ER hypertensive to >696 systolic, With medication management for blood pressure He presents for follow-up of his PAD, hypertension  Recent carotid ultrasound reviewed with him in detail Last carotid U/S 02/2017: <39% on the right Minimal on the left, CEA   In follow-up today he reports having shortness of breath on exertion Walking around his house back and forth to the workshop has to sit down to catch his breath Denies any significant chest pain, mainly breathing Does not do any regular exercise  Chronic back pain, unable to walk very far  He is having cramping in his legs at nighttime, not all the time  EKG personally reviewed by myself on todays visit Shows normal sinus rhythm rate 56 bpm no significant ST or T-wave changes  Other past medical history History of severe left carotid stenosis, estimated at greater than 70% of the proximal left internal carotid artery.  He has refused open carotid endarterectomy in the past but reconsidered late 2013 and had left CEA in [place].  Prior ultrasound showedCarotid stenosis - R 40-59%, L >80% TEE - vigorous systolic fxn with EF 29-52%, no vegetations/thrombus. Total cholesterol 140, LDL 72  Initial  MRI/MRA: 1. Small acute infarcts in the right hippocampus and right occipital lobe, implicating right PCA territory ischemia. No mass effect or hemorrhage.  2. Chronic small vessel ischemia in the left basal ganglia and possibly also the medial left thalamus.   IMPRESSION: 1. High-grade stenosis or focal near occlusion of the right PCA distal P2 segment with attenuated more distal right PCA flow.  2. Moderate to severe focal stenosis of the left PCA P2 segment.  3. 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  4. Mild to moderate anterior circulation atherosclerosis, most pronounced in the MCA M1 segments.  No Known Allergies  PMH:   has a past medical history of Carotid stenosis (01/2013); History of chicken pox; HLD (hyperlipidemia); HTN (hypertension); Left homonymous hemianopsia (05/2012); Obesity; and Stroke ([place]) (05/2012).  PSH:    Past Surgical History:  Procedure Laterality Date  . CAROTID ENDARTERECTOMY Left 07-21-12   cea  . CATARACT EXTRACTION  2004   bilateral  . COLONOSCOPY WITH PROPOFOL N/A 06/24/2015   3 polyps, one with high grade dysplasia, rpt 3 mo; Craig Lame, MD  . COLONOSCOPY WITH PROPOFOL N/A 10/14/2015   hyperplastic polyp; f/u PRN; Craig Lame, MD  . ENDARTERECTOMY  07/21/2012   Left CEA; Surgeon: [name], MD  . EYE SURGERY    . MELANOMA EXCISION  12/30/2016   right side of head above ear  . PATCH ANGIOPLASTY  07/21/2012   Procedure: PATCH ANGIOPLASTY;  Surgeon: [name], MD;  Location: [facility];  Service: Vascular;  Laterality: Left;  Using 1 cm x 6 cm Vascu Guard patch   . TEE WITHOUT CARDIOVERSION  05/22/2012   Procedure: TRANSESOPHAGEAL ECHOCARDIOGRAM (TEE);  Surgeon: [name], MD;  [facility] END[facility]COPY;  Service: Cardiovascular;  Laterality: N/A;  . WISDOM TOOTH EXTRACTION      Current Outpatient Prescriptions  Medication Sig Dispense Refill  . amLODipine (NORVASC) 10 MG tablet TAKE 1 TABLET BY MOUTH EVERY DAY 90  tablet 2  . aspirin 81 MG tablet Take 81 mg by mouth daily.    Marland Kitchen atorvastatin (LIPITOR) 20 MG tablet TAKE 1 TABLET BY MOUTH DAILY AT 6PM 90 tablet 3  . clopidogrel (PLAVIX) 75 MG tablet TAKE 1 TABLET BY MOUTH ONCE A DAY 90 tablet 3  . Cyanocobalamin (VITAMIN B 12 PO) Take by mouth daily.    Marland Kitchen docusate sodium (COLACE) 100 MG capsule Take 100 mg by mouth daily as needed for mild constipation. Reported on 07/31/2015    . lisinopril-hydrochlorothiazide (PRINZIDE,ZESTORETIC) 20-12.5 MG tablet Take 1 tablet by mouth daily. 90 tablet 3  . Multiple Vitamins-Minerals (CENTRUM SILVER ADULT 50+ PO) Take 1 tablet by mouth daily.     No current facility-administered medications for this visit.      Allergies:   Patient has no known allergies.   Social History:  The patient  reports that he quit smoking about 22 years ago. His smoking use included Cigarettes. He has a 49.50 pack-year smoking history. He has never used smokeless tobacco. He reports that he drinks about 6.0 - 7.2 oz of alcohol per week . He reports that he does not use drugs.   Family History:   family history includes Aneurysm (age of onset: 74) in his paternal uncle; CAD (age of onset: 52) in his brother; Cirrhosis (age of onset: 81) in his father; Heart attack in his brother; Heart disease in his brother and daughter; Hyperlipidemia in his daughter; Hypertension in his mother.    Review of Systems: Review of Systems  Constitutional: Negative.   Respiratory: Negative.   Cardiovascular: Negative.   Gastrointestinal: Negative.   Musculoskeletal: Positive for myalgias.  Neurological: Negative.   Psychiatric/Behavioral: Negative.   All other systems reviewed and are negative.    PHYSICAL EXAM: VS:  BP 140/60 (BP Location: Left Arm, Patient Position: Sitting, Cuff Size: Normal)   Pulse (!) 56   Ht 5\' 3"  (1.6 m)   Wt 185 lb 8 oz (84.1 kg)   BMI 32.86 kg/m  , BMI Body mass index is 32.86 kg/m.  GEN: Well nourished, well  developed, in no acute distress  HEENT: normal  Neck: no JVD, carotid bruits, or masses Cardiac: RRR; no murmurs, rubs, or gallops,no edema  Respiratory:  clear to auscultation bilaterally, normal work of breathing GI: soft, nontender, nondistended, + BS MS: no deformity or atrophy  Skin: warm and dry, no rash Neuro:  Strength and sensation are intact Psych: euthymic mood, full affect    Recent Labs: 01/07/2017: ALT 23; Hemoglobin 12.0; Platelets 241.0; TSH 3.14 02/10/2017: BUN 30; Creatinine, Ser 1.59; Potassium 4.4; Sodium 141    Lipid Panel Lab Results  Component Value Date   CHOL 123 01/07/2017   HDL 44.20 01/07/2017   LDLCALC 63 01/07/2017   TRIG 81.0 01/07/2017      Wt Readings from Last 3 Encounters:  04/28/17 185 lb 8 oz (84.1 kg)  03/07/17 186 lb (84.4 kg)  01/04/17 192 lb 4 oz (87.2 kg)       ASSESSMENT AND PLAN:  Essential hypertension - Plan: EKG 12-Lead Blood pressure is well controlled on today's visit. No changes made to the medications.  Hyperlipidemia Cholesterol is at goal on the current lipid regimen. No changes to the medications were made. He is having cramping in his legs, recommended he try a Lipitor holiday for 2 or 3 weeks If symptoms improve recommended he call our office for further instructions We discussed with him we could use a lower dose Lipitor or change to an alternate statin Potentially could add Zetia with lower dose alternate statin if needed  Carotid stenosis, bilateral Ultrasound results reviewed with him in detail. Last study 2018 Stable disease  CKD (chronic kidney disease) stage 3, GFR 30-59 ml/min Stable renal function,  On lisinopril  Shortness of breath Unable to exclude ischemia as a cause of his symptoms He has known severe PAD Stress test ordered, uncertain if he will be able to treadmill, will try pharmacologic Myoview   Total encounter time more than 25 minutes  Greater than 50% was spent in counseling and  coordination of care with the patient    Disposition:   F/U  12 months   Orders Placed This Encounter  Procedures  . NM Myocar Multi W/Spect W/Wall Motion / EF  . EKG 12-Lead     Signed, Esmond Plants, M.D., Ph.D. 04/28/2017  [place], [place]

## 2017-04-28 ENCOUNTER — Ambulatory Visit (INDEPENDENT_AMBULATORY_CARE_PROVIDER_SITE_OTHER): Payer: Medicare Other | Admitting: Cardiovascular Disease

## 2017-04-28 ENCOUNTER — Encounter: Payer: Self-pay | Admitting: Cardiovascular Disease

## 2017-04-28 VITALS — BP 140/60 | HR 56 | Ht 63.0 in | Wt 185.5 lb

## 2017-04-28 DIAGNOSIS — I6523 Occlusion and stenosis of bilateral carotid arteries: Secondary | ICD-10-CM

## 2017-04-28 DIAGNOSIS — Z8673 Personal history of transient ischemic attack (TIA), and cerebral infarction without residual deficits: Secondary | ICD-10-CM

## 2017-04-28 DIAGNOSIS — E782 Mixed hyperlipidemia: Secondary | ICD-10-CM

## 2017-04-28 DIAGNOSIS — I10 Essential (primary) hypertension: Secondary | ICD-10-CM

## 2017-04-28 DIAGNOSIS — I739 Peripheral vascular disease, unspecified: Secondary | ICD-10-CM | POA: Diagnosis not present

## 2017-04-28 DIAGNOSIS — I6529 Occlusion and stenosis of unspecified carotid artery: Secondary | ICD-10-CM | POA: Diagnosis not present

## 2017-04-28 DIAGNOSIS — Z87891 Personal history of nicotine dependence: Secondary | ICD-10-CM | POA: Diagnosis not present

## 2017-04-28 DIAGNOSIS — R0602 Shortness of breath: Secondary | ICD-10-CM | POA: Diagnosis not present

## 2017-04-28 DIAGNOSIS — N183 Chronic kidney disease, stage 3 unspecified: Secondary | ICD-10-CM

## 2017-04-28 NOTE — Patient Instructions (Addendum)
Medication Instructions:   No medication changes made  Labwork:  No new labs needed  Testing/Procedures:  We will order a treadmill myoview for shortness of breath, known PAD  Stop the norvasc/amlodipine the night before and morning of the test  No food morning of the test No caffeine 24 hours before the test  [place]  Your caregiver has ordered a Stress Test with nuclear imaging. The purpose of this test is to evaluate the blood supply to your heart muscle. This procedure is referred to as a "Non-Invasive Stress Test." This is because other than having an IV started in your vein, nothing is inserted or "invades" your body. Cardiac stress tests are done to find areas of poor blood flow to the heart by determining the extent of coronary artery disease (CAD). Some patients exercise on a treadmill, which naturally increases the blood flow to your heart, while others who are  unable to walk on a treadmill due to physical limitations have a pharmacologic/chemical stress agent called Lexiscan . This medicine will mimic walking on a treadmill by temporarily increasing your coronary blood flow.   Please note: these test may take anywhere between 2-4 hours to complete  PLEASE REPORT TO [facility] AT THE FIRST DESK WILL DIRECT YOU WHERE TO GO  Date of Procedure:___Thursday October 25th___  Arrival Time for Procedure:____07:15AM_____  Instructions regarding medication:    __X__:  Hold Norvasc (Amlodpine) the night before procedure and morning of procedure   PLEASE NOTIFY THE OFFICE AT LEAST 24 HOURS IN ADVANCE IF YOU ARE UNABLE TO KEEP YOUR APPOINTMENT.  [phone] AND  PLEASE NOTIFY NUCLEAR MEDICINE AT [facility] AT LEAST 24 HOURS IN ADVANCE IF YOU ARE UNABLE TO KEEP YOUR APPOINTMENT. [phone]  How to prepare for your Myoview test:  1. Do not eat or drink after midnight 2. No caffeine for 24 hours prior to test 3. No smoking 24 hours prior to  test. 4. Your medication may be taken with water.  If your doctor stopped a medication because of this test, do not take that medication. 5. Ladies, please do not wear dresses.  Skirts or pants are appropriate. Please wear a short sleeve shirt. 6. No perfume, cologne or lotion. 7. Wear comfortable walking shoes. No heels!   Follow-Up: It was a pleasure seeing you in the office today. Please call us if you have new issues that need to be addressed before your next appt.  [phone]  Your physician wants you to follow-up in: 12 months.  You will receive a reminder letter in the mail two months in advance. If you don't receive a letter, please call our office to schedule the follow-up appointment.  If you need a refill on your cardiac medications before your next appointment, please call your pharmacy.

## 2017-05-05 ENCOUNTER — Ambulatory Visit
Admission: RE | Admit: 2017-05-05 | Discharge: 2017-05-05 | Disposition: A | Discharge status: Home / Self Care | Payer: Medicare Other | Source: Ambulatory Visit | Attending: Cardiovascular Disease | Admitting: Cardiovascular Disease

## 2017-05-05 DIAGNOSIS — R0602 Shortness of breath: Secondary | ICD-10-CM | POA: Insufficient documentation

## 2017-05-05 MED ORDER — TECHNETIUM TC 99M TETROFOSMIN IV KIT
30.0000 | PACK | Freq: Once | INTRAVENOUS | Status: AC | PRN
  Administered 2017-05-05: 31.53 via INTRAVENOUS

## 2017-05-05 MED ORDER — TECHNETIUM TC 99M TETROFOSMIN IV KIT
10.0000 | PACK | Freq: Once | INTRAVENOUS | Status: AC | PRN
  Administered 2017-05-05: 13.27 via INTRAVENOUS

## 2017-05-14 LAB — NM MYOCAR MULTI W/SPECT W/WALL MOTION / EF
CHL CUP RESTING HR STRESS: 53 {beats}/min
CHL CUP STRESS STAGE 1 GRADE: 0 %
CHL CUP STRESS STAGE 1 SPEED: 0 mph
CHL CUP STRESS STAGE 2 GRADE: 0 %
CHL CUP STRESS STAGE 2 HR: 51 {beats}/min
CHL CUP STRESS STAGE 3 SBP: 166 mmHg
CHL CUP STRESS STAGE 4 DBP: 95 mmHg
CHL CUP STRESS STAGE 4 GRADE: 12 %
CHL CUP STRESS STAGE 4 HR: 130 {beats}/min
CHL CUP STRESS STAGE 4 SBP: 181 mmHg
CHL CUP STRESS STAGE 6 GRADE: 0 %
CHL CUP STRESS STAGE 6 HR: 88 {beats}/min
CHL CUP STRESS STAGE 6 SBP: 185 mmHg
CHL CUP STRESS STAGE 6 SPEED: 0 mph
CSEPED: 6 min
CSEPEDS: 13 s
CSEPHR: 91 %
CSEPPHR: 130 {beats}/min
Estimated workload: 7 METS
LV dias vol: 85 mL (ref 62–150)
LVSYSVOL: 28 mL
Peak BP: 181 mmHg
Percent of predicted max HR: 91 %
SDS: 4
SRS: 0
SSS: 4
Stage 1 HR: 53 {beats}/min
Stage 2 Speed: 0 mph
Stage 3 DBP: 82 mmHg
Stage 3 Grade: 10 %
Stage 3 HR: 112 {beats}/min
Stage 3 Speed: 1.7 mph
Stage 4 Speed: 2.5 mph
Stage 5 Grade: 0 %
Stage 5 HR: 107 {beats}/min
Stage 5 Speed: 0 mph
Stage 6 DBP: 76 mmHg
TID: 0.87

## 2017-06-11 ENCOUNTER — Other Ambulatory Visit: Payer: Self-pay | Admitting: Family Medicine

## 2017-09-13 ENCOUNTER — Telehealth: Payer: Self-pay | Admitting: Cardiovascular Disease

## 2017-09-13 NOTE — Telephone Encounter (Signed)
No answer. Left message to call back.   

## 2017-09-13 NOTE — Telephone Encounter (Signed)
Pt c/o of Chest Pain: STAT if CP now or developed within 24 hours  1. Are you having CP right now? Yes   2. Are you experiencing any other symptoms (ex. SOB, nausea, vomiting, sweating)? *no  3. How long have you been experiencing CP? A good little while   4. Is your CP continuous or coming and going? Continuous stabbing pain not constant but pretty much always there   5. Have you taken Nitroglycerin? No  ?   Patient wanted asap appt   Declined app staff scheduled 3/11 with [facility]

## 2017-09-17 NOTE — Progress Notes (Signed)
Patient ID: Craig Avery, male   DOB: [birth year], 77 y.o.   MRN: [id number] Cardiology Office Note  Date:  09/19/2017   ID:  Craig Avery, DOB 1940-09-14, MRN [id number]  PCP:  [name], MD   Chief Complaint  Patient presents with  . other    Pt. c/o chest pain that started several months ago; the pain is with movement with occas. feeling like a stabbing. Meds reviewed by the pt. verbally.     HPI:  77 year-old gentleman with history of hypertension  peripheral vascular disease, Carotid endarterectomy on the left July 2014,  smoking history , 20 to 30 yr recent hospitalization x2 at [facility], L sided paresthesias, Found to have stroke (acute right PCA distribution ischemic infarction involving the right occipital region and right thalamus along with severe R PCA), returned a few days later to ER hypertensive to >856 systolic, With medication management for blood pressure He presents for follow-up of his PAD, hypertension  Lost wife 08/2017 Daughters in town are helping He is active but no regular exercise program Works in his workshop Takes care of himself does his own shopping and housework  He is having right lower rib pain when he moves in certain directions, feels like a sticking in his ribs He has had this over the past several months Does not remember any trauma to the area  Previously reported having shortness of breath with exertion For this we did stress test October 2018 showing no ischemia  Chronic back pain, unable to walk very far  Previously with cramping in his legs at nighttime,  Stress test 05/05/2017 Exercise myocardial perfusion imaging study with no significant  Ischemia Small region of apical thinning noted, likely attenuation artifact Normal wall motion, EF estimated at 67% No EKG changes concerning for ischemia at peak stress or in recovery. Low risk scan   carotid U/S 02/2017: <39% on the right Minimal on the left, CEA   No abdominal  ultrasound to rule out AAA  EKG personally reviewed by myself on todays visit Shows normal sinus rhythm rate 66 bpm no significant ST or T-wave changes Stable Other past medical history History of severe left carotid stenosis, estimated at greater than 70% of the proximal left internal carotid artery.  He has refused open carotid endarterectomy in the past but reconsidered late 2013 and had left CEA in [place].  Prior ultrasound showedCarotid stenosis - R 40-59%, L >80% TEE - vigorous systolic fxn with EF 31-49%, no vegetations/thrombus. Total cholesterol 140, LDL 72  Initial MRI/MRA: 1. Small acute infarcts in the right hippocampus and right occipital lobe, implicating right PCA territory ischemia. No mass effect or hemorrhage.  2. Chronic small vessel ischemia in the left basal ganglia and possibly also the medial left thalamus.   IMPRESSION: 1. High-grade stenosis or focal near occlusion of the right PCA distal P2 segment with attenuated more distal right PCA flow.  2. Moderate to severe focal stenosis of the left PCA P2 segment.  3. 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  4. Mild to moderate anterior circulation atherosclerosis, most pronounced in the MCA M1 segments.  No Known Allergies  PMH:   has a past medical history of Carotid stenosis (01/2013), History of chicken pox, HLD (hyperlipidemia), HTN (hypertension), Left homonymous hemianopsia (05/2012), Obesity, and Stroke ([place]) (05/2012).  PSH:    Past Surgical History:  Procedure Laterality Date  . CAROTID ENDARTERECTOMY Left 07-21-12   cea  . CATARACT EXTRACTION  2004   bilateral  . COLONOSCOPY WITH PROPOFOL N/A 06/24/2015   3 polyps, one with high grade dysplasia, rpt 3 mo; Craig Lame, MD  . COLONOSCOPY WITH PROPOFOL N/A 10/14/2015   hyperplastic polyp; f/u PRN; Craig Lame, MD  . ENDARTERECTOMY  07/21/2012   Left CEA; Surgeon: [name], MD  . EYE SURGERY    . MELANOMA EXCISION   12/30/2016   right side of head above ear  . PATCH ANGIOPLASTY  07/21/2012   Procedure: PATCH ANGIOPLASTY;  Surgeon: [name], MD;  Location: [facility] OR;  Service: Vascular;  Laterality: Left;  Using 1 cm x 6 cm Vascu Guard patch   . TEE WITHOUT CARDIOVERSION  05/22/2012   Procedure: TRANSESOPHAGEAL ECHOCARDIOGRAM (TEE);  Surgeon: [name], MD;  Location: [facility] ENDOSCOPY;  Service: Cardiovascular;  Laterality: N/A;  . WISDOM TOOTH EXTRACTION      Current Outpatient Medications  Medication Sig Dispense Refill  . amLODipine (NORVASC) 10 MG tablet TAKE 1 TABLET BY MOUTH EVERY DAY 90 tablet 2  . aspirin 81 MG tablet Take 81 mg by mouth daily.    Marland Kitchen atorvastatin (LIPITOR) 20 MG tablet TAKE 1 TABLET BY MOUTH DAILY AT 6PM 90 tablet 3  . clopidogrel (PLAVIX) 75 MG tablet TAKE 1 TABLET BY MOUTH ONCE A DAY 90 tablet 1  . Cyanocobalamin (VITAMIN B 12 PO) Take by mouth daily.    Marland Kitchen docusate sodium (COLACE) 100 MG capsule Take 100 mg by mouth daily as needed for mild constipation. Reported on 07/31/2015    . lisinopril-hydrochlorothiazide (PRINZIDE,ZESTORETIC) 20-12.5 MG tablet Take 1 tablet by mouth daily. 90 tablet 3  . Multiple Vitamins-Minerals (CENTRUM SILVER ADULT 50+ PO) Take 1 tablet by mouth daily.     No current facility-administered medications for this visit.      Allergies:   Patient has no known allergies.   Social History:  The patient  reports that he quit smoking about 23 years ago. His smoking use included cigarettes. He has a 49.50 pack-year smoking history. he has never used smokeless tobacco. He reports that he drinks about 6.0 - 7.2 oz of alcohol per week. He reports that he does not use drugs.   Family History:   family history includes Aneurysm (age of onset: 67) in his paternal uncle; CAD (age of onset: 80) in his brother; Cirrhosis (age of onset: 53) in his father; Heart attack in his brother; Heart disease in his brother and daughter; Hyperlipidemia in his daughter;  Hypertension in his mother.    Review of Systems: Review of Systems  Constitutional: Negative.   Respiratory: Negative.   Cardiovascular: Negative.   Gastrointestinal: Negative.   Musculoskeletal: Negative.   Neurological: Negative.   Psychiatric/Behavioral: Negative.   All other systems reviewed and are negative.    PHYSICAL EXAM: VS:  BP 130/60 (BP Location: Left Arm, Patient Position: Sitting, Cuff Size: Normal)   Pulse 66   Ht 5\' 4"  (1.626 m)   Wt 192 lb (87.1 kg)   BMI 32.96 kg/m  , BMI Body mass index is 32.96 kg/m.  1Constitutional:  oriented to person, place, and time. No distress.  HENT:  Head: Normocephalic and atraumatic.  Eyes:  no discharge. No scleral icterus.  Neck: Normal range of motion. Neck supple. No JVD present.  Cardiovascular: Normal rate, regular rhythm, normal heart sounds and intact distal pulses. Exam reveals no gallop and no friction rub. No edema No murmur heard. Pulmonary/Chest: Effort normal and breath sounds normal. No  stridor. No respiratory distress.  no wheezes.  no rales.  no tenderness.  Abdominal: Soft.  no distension.  no tenderness.  Musculoskeletal: Normal range of motion.  no  tenderness or deformity.  Neurological:  normal muscle tone. Coordination normal. No atrophy Skin: Skin is warm and dry. No rash noted. not diaphoretic.  Psychiatric:  normal mood and affect. behavior is normal. Thought content normal.   Recent Labs: 01/07/2017: ALT 23; Hemoglobin 12.0; Platelets 241.0; TSH 3.14 02/10/2017: BUN 30; Creatinine, Ser 1.59; Potassium 4.4; Sodium 141    Lipid Panel Lab Results  Component Value Date   CHOL 123 01/07/2017   HDL 44.20 01/07/2017   LDLCALC 63 01/07/2017   TRIG 81.0 01/07/2017      Wt Readings from Last 3 Encounters:  09/19/17 192 lb (87.1 kg)  04/28/17 185 lb 8 oz (84.1 kg)  03/07/17 186 lb (84.4 kg)      ASSESSMENT AND PLAN:  Essential hypertension - Plan: EKG 12-Lead Blood pressure is well  controlled on today's visit. No changes made to the medications. Stable  Hyperlipidemia Previously with myalgias, not as much an issue on today's visit Cholesterol is at goal on the current lipid regimen. No changes to the medications were made. We will order abdominal aortic ultrasound to rule out AAA given history of smoking Discussed with him in detail  Carotid stenosis, bilateral Ultrasound results reviewed with him in detail. Last study 2018 Stable disease No changes  CKD (chronic kidney disease) stage 3, GFR 30-59 ml/min Stable renal function,  On lisinopril No changes  Shortness of breath He has known severe PAD Previous stress test with no significant ischemia   Total encounter time more than 25 minutes  Greater than 50% was spent in counseling and coordination of care with the patient  Disposition:   F/U  12 months   Orders Placed This Encounter  Procedures  . EKG 12-Lead     Signed, Esmond Plants, M.D., Ph.D. 09/19/2017  [place], [place]

## 2017-09-19 ENCOUNTER — Encounter: Payer: Self-pay | Admitting: Cardiovascular Disease

## 2017-09-19 ENCOUNTER — Ambulatory Visit (INDEPENDENT_AMBULATORY_CARE_PROVIDER_SITE_OTHER): Payer: Medicare Other | Admitting: Cardiovascular Disease

## 2017-09-19 VITALS — BP 130/60 | HR 66 | Ht 64.0 in | Wt 192.0 lb

## 2017-09-19 DIAGNOSIS — E782 Mixed hyperlipidemia: Secondary | ICD-10-CM

## 2017-09-19 DIAGNOSIS — Z8673 Personal history of transient ischemic attack (TIA), and cerebral infarction without residual deficits: Secondary | ICD-10-CM | POA: Diagnosis not present

## 2017-09-19 DIAGNOSIS — I739 Peripheral vascular disease, unspecified: Secondary | ICD-10-CM

## 2017-09-19 DIAGNOSIS — I10 Essential (primary) hypertension: Secondary | ICD-10-CM

## 2017-09-19 DIAGNOSIS — R0602 Shortness of breath: Secondary | ICD-10-CM | POA: Diagnosis not present

## 2017-09-19 DIAGNOSIS — Z87891 Personal history of nicotine dependence: Secondary | ICD-10-CM

## 2017-09-19 DIAGNOSIS — I6523 Occlusion and stenosis of bilateral carotid arteries: Secondary | ICD-10-CM | POA: Diagnosis not present

## 2017-09-19 NOTE — Telephone Encounter (Signed)
Patient in today to see Dr. Rockey Situ. See office visit note.

## 2017-09-19 NOTE — Patient Instructions (Signed)
Medication Instructions:   No medication changes made  Labwork:  No new labs needed  Testing/Procedures:  We will order a ABD aortic u/s for AAA   Follow-Up: It was a pleasure seeing you in the office today. Please call us if you have new issues that need to be addressed before your next appt.  [phone]  Your physician wants you to follow-up in: 12 months.  You will receive a reminder letter in the mail two months in advance. If you don't receive a letter, please call our office to schedule the follow-up appointment.  If you need a refill on your cardiac medications before your next appointment, please call your pharmacy.  For educational health videos Log in to : [email or web address] Or : [email or web address], password : [email or web address]

## 2017-09-22 ENCOUNTER — Other Ambulatory Visit: Payer: Self-pay | Admitting: Cardiovascular Disease

## 2017-09-22 DIAGNOSIS — Z87891 Personal history of nicotine dependence: Secondary | ICD-10-CM

## 2017-09-30 ENCOUNTER — Ambulatory Visit (INDEPENDENT_AMBULATORY_CARE_PROVIDER_SITE_OTHER): Payer: Medicare Other

## 2017-09-30 DIAGNOSIS — Z136 Encounter for screening for cardiovascular disorders: Secondary | ICD-10-CM

## 2017-09-30 DIAGNOSIS — Z87891 Personal history of nicotine dependence: Secondary | ICD-10-CM | POA: Diagnosis not present

## 2017-10-03 ENCOUNTER — Other Ambulatory Visit: Payer: Self-pay | Admitting: *Deleted

## 2017-10-03 DIAGNOSIS — I739 Peripheral vascular disease, unspecified: Secondary | ICD-10-CM

## 2017-11-16 ENCOUNTER — Ambulatory Visit (INDEPENDENT_AMBULATORY_CARE_PROVIDER_SITE_OTHER): Payer: Medicare Other | Admitting: Internal Medicine

## 2017-11-16 ENCOUNTER — Encounter: Payer: Self-pay | Admitting: Internal Medicine

## 2017-11-16 VITALS — BP 144/60 | HR 61 | Ht 64.0 in | Wt 192.8 lb

## 2017-11-16 DIAGNOSIS — I739 Peripheral vascular disease, unspecified: Secondary | ICD-10-CM | POA: Insufficient documentation

## 2017-11-16 DIAGNOSIS — Z8673 Personal history of transient ischemic attack (TIA), and cerebral infarction without residual deficits: Secondary | ICD-10-CM

## 2017-11-16 DIAGNOSIS — I10 Essential (primary) hypertension: Secondary | ICD-10-CM | POA: Diagnosis not present

## 2017-11-16 NOTE — Patient Instructions (Signed)
Medication Instructions:  Your physician recommends that you continue on your current medications as directed. Please refer to the Current Medication list given to you today.   Labwork: NONE  Testing/Procedures: Your physician has requested that you have a lower extremity arterial doppler- During this test, ultrasound is used to evaluate arterial blood flow in the legs. Allow approximately one hour for this exam.   Your physician has requested that you have an ankle brachial index (ABI). During this test an ultrasound and blood pressure cuff are used to evaluate the arteries that supply the arms and legs with blood. Allow thirty minutes for this exam. There are no restrictions or special instructions.  Your physician has requested that you have doppler of your iliac arteries.    Follow-Up: Your physician recommends that you schedule a follow-up appointment in: 3 MONTHS WITH DR END.  If you need a refill on your cardiac medications before your next appointment, please call your pharmacy.

## 2017-11-16 NOTE — Progress Notes (Signed)
New Outpatient Visit Date: 11/16/2017  Referring Provider: Esmond Plants, MD PhD [place]  Chief Complaint: Leg pain and abnormal aortic ultrasound  HPI:  Mr. Dealmeida is a 77 y.o. male who is being seen today for the evaluation of leg pain and abnormal abdominal aortic ultrasound at the request of Dr. Rockey Situ. He has a history of carotid artery stenosis complicated by stroke status post left carotid endarterectomy, hypertension, hyperlipidemia, and obesity.  Mr. 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  Mr. Kapur also notes pain in his calves when walking more than 200 yards, which has been present for at least a year.  He typically will stop for a few minutes with prompt resolution of the pain and then continue his activity.  He has not had any wounds or rest pain.  Mr. Lindahl denies a history of vascular disease in his lower extremities.  From a heart standpoint, Mr. Anzalone has been doing well, denying chest pain, shortness of breath, palpitations, and lightheadedness.  He has been compliant with his medications and is tolerating dual antiplatelet therapy without bleeding.  He does not check his blood pressure regularly at home.  Mr. Esquivias denies any residual neurologic deficits from his stroke.  --------------------------------------------------------------------------------------------------  Cardiovascular History & Procedures: Cardiovascular Problems:  Peripheral vascular disease with claudication  Stroke with carotid artery stenosis  Risk Factors:  Peripheral vascular disease, hypertension, hyperlipidemia, male gender, obesity, and age greater than 14  Cath/PCI:  None  CV Surgery:  Left carotid endarterectomy (07/21/2012, Dr. Trula Slade)  EP Procedures and Devices:  None  Non-Invasive Evaluation(s):  AAA screen (09/30/2017): Normal sized abdominal aorta.  Bilateral iliac disease greater than 50%,  extending to the proximal common femoral artery on the right.  Exercise MPI (05/05/2017): Low risk study without ischemia.  Apical thinning versus attenuation artifact noted.  LVEF 67%.  Carotid Doppler (02/28/2017): Right internal carotid artery with 1 to 39% stenosis.  Patent left carotid endarterectomy site without restenosis.  Right external carotid artery stenosis noted.  Overall, no change since 02/2016.  Recent CV Pertinent Labs: Lab Results  Component Value Date   CHOL 123 01/07/2017   CHOL 140 09/25/2012   HDL 44.20 01/07/2017   HDL 47 09/25/2012   LDLCALC 63 01/07/2017   LDLCALC 72 09/25/2012   LDLDIRECT 74.0 07/07/2016   TRIG 81.0 01/07/2017   CHOLHDL 3 01/07/2017   INR 0.91 07/14/2012   K 4.4 02/10/2017   BUN 30 (H) 02/10/2017   CREATININE 1.59 (H) 02/10/2017    --------------------------------------------------------------------------------------------------  Past Medical History:  Diagnosis Date  . Carotid stenosis 01/2013   bilateral, s/p L CEA, plan rpt [place] qyear  . History of chicken pox   . HLD (hyperlipidemia)   . HTN (hypertension)   . 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 Obesity   . Stroke [facility]) 05/2012   R PCA territory, presented with L paresthesias    Past Surgical History:  Procedure Laterality Date  . CAROTID ENDARTERECTOMY Left 07-21-12   cea  . CATARACT EXTRACTION  2004   bilateral  . COLONOSCOPY WITH PROPOFOL N/A 06/24/2015   3 polyps, one with high grade dysplasia, rpt 3 mo; [name], MD  . COLONOSCOPY WITH PROPOFOL N/A 10/14/2015   hyperplastic polyp; f/u PRN; [name], MD  . ENDARTERECTOMY  07/21/2012   Left CEA; Surgeon: [name], MD  . EYE  SURGERY    . MELANOMA EXCISION  12/30/2016   right side of head above ear  . PATCH ANGIOPLASTY  07/21/2012   Procedure: PATCH ANGIOPLASTY;  Surgeon: [name], MD;  Location: [facility] OR;  Service: Vascular;  Laterality:  Left;  Using 1 cm x 6 cm Vascu Guard patch   . TEE WITHOUT CARDIOVERSION  05/22/2012   Procedure: TRANSESOPHAGEAL ECHOCARDIOGRAM (TEE);  Surgeon: [name], MD;  Location: [facility] ENDOSCOPY;  Service: Cardiovascular;  Laterality: N/A;  . WISDOM TOOTH EXTRACTION      Current Meds  Medication Sig  . amLODipine (NORVASC) 10 MG tablet TAKE 1 TABLET BY MOUTH EVERY DAY  . aspirin 81 MG tablet Take 81 mg by mouth daily.  Marland Kitchen atorvastatin (LIPITOR) 20 MG tablet TAKE 1 TABLET BY MOUTH DAILY AT 6PM  . clopidogrel (PLAVIX) 75 MG tablet TAKE 1 TABLET BY MOUTH ONCE A DAY  . Cyanocobalamin (VITAMIN B 12 PO) Take by mouth daily.  Marland Kitchen docusate sodium (COLACE) 100 MG capsule Take 100 mg by mouth daily as needed for mild constipation. Reported on 07/31/2015  . lisinopril-hydrochlorothiazide (PRINZIDE,ZESTORETIC) 20-12.5 MG tablet Take 1 tablet by mouth daily.  . Multiple Vitamins-Minerals (CENTRUM SILVER ADULT 50+ PO) Take 1 tablet by mouth daily.    Allergies: Patient has no known allergies.  Social History   Tobacco Use  . Smoking status: Former Smoker    Packs/day: 1.50    Years: 33.00    Pack years: 49.50    Types: Cigarettes    Last attempt to quit: 06/18/1994    Years since quitting: 23.4  . Smokeless tobacco: Never Used  Substance Use Topics  . Alcohol use: Yes    Alcohol/week: 6.0 - 7.2 oz    Types: 10 - 12 Cans of beer per week  . Drug use: No    Family History  Problem Relation Age of Onset  . CAD Brother 51       MI  . Heart disease Brother        before age 88  . Heart attack Brother   . Hypertension Mother   . Cirrhosis Father 61       EtOHic  . Aneurysm Paternal Uncle 65  . Heart disease Daughter   . Hyperlipidemia Daughter     Review of Systems: A 12-system review of systems was performed and was negative except as noted in the HPI.  --------------------------------------------------------------------------------------------------  Physical Exam: BP (!) 144/60    Pulse 61   Ht 5\' 4"  (1.626 m)   Wt 192 lb 12 oz (87.4 kg)   BMI 33.09 kg/m   General: Obese man, seated comfortably in the exam room. HEENT: No conjunctival pallor or scleral icterus. Moist mucous membranes. OP clear. Neck: Supple without lymphadenopathy, thyromegaly, JVD, or HJR. Lungs: Normal work of breathing. Clear to auscultation bilaterally without wheezes or crackles. Heart: Regular rate and rhythm without murmurs, rubs, or gallops. Non-displaced PMI. Abd: Bowel sounds present. Soft, NT/ND without hepatosplenomegaly Ext: No lower extremity edema.  2+ radial pulses.  Trace to 1+ pedal pulses bilaterally.  2+ femoral pulses bilaterally. Skin: Warm and dry without rash. Neuro: CNIII-XII intact. Strength and fine-touch sensation intact in upper and lower extremities bilaterally. Psych: Normal mood and affect.  EKG: Normal sinus rhythm without abnormalities.  Lab Results  Component Value Date   WBC 9.0 01/07/2017   HGB 12.0 (L) 01/07/2017   HCT 34.6 (L) 01/07/2017   MCV 91.2 01/07/2017   PLT 241.0 01/07/2017  Lab Results  Component Value Date   NA 141 02/10/2017   K 4.4 02/10/2017   CL 106 02/10/2017   CO2 29 02/10/2017   BUN 30 (H) 02/10/2017   CREATININE 1.59 (H) 02/10/2017   GLUCOSE 114 (H) 02/10/2017   ALT 23 01/07/2017    Lab Results  Component Value Date   CHOL 123 01/07/2017   HDL 44.20 01/07/2017   LDLCALC 63 01/07/2017   LDLDIRECT 74.0 07/07/2016   TRIG 81.0 01/07/2017   CHOLHDL 3 01/07/2017     --------------------------------------------------------------------------------------------------  ASSESSMENT AND PLAN: Claudication with peripheral vascular disease Mr. Capano reports greater than 1 year of pain in both calves when walking extended periods (greater than 200 yards), consistent with [place] stage I-II claudication.  He does not have any wounds or symptoms consistent with critical limb ischemia.  Recent AAA screening ultrasound suggests  bilateral inflow disease.  However, his symptoms are more suggestive of outflow or runoff disease.  We have discussed further evaluation options and have agreed to obtain ABIs and iliac and bilateral lower extremity arterial Doppler studies.  Given his chronic kidney disease and relatively limited symptoms, I favor a conservative approach if possible.  I encouraged Mr. Oberholzer to walk as much as possible.  Given lack of heart failure in the past, cilostazol may also be an option if his pain worsens in the future.  Hypertension Blood pressure mildly elevated today.  I have encouraged compliance with his medications as well as sodium restriction.  I will defer further medication changes to Drs. Rockey Situ and Danise Mina.  History of stroke Continue secondary prevention and follow-up with vascular surgery.  Follow-up: Return to clinic in 3 months.  Nelva Bush, MD 11/16/2017 12:53 PM

## 2017-11-24 ENCOUNTER — Other Ambulatory Visit: Payer: Self-pay | Admitting: Internal Medicine

## 2017-11-24 DIAGNOSIS — I739 Peripheral vascular disease, unspecified: Secondary | ICD-10-CM

## 2017-12-02 ENCOUNTER — Ambulatory Visit (INDEPENDENT_AMBULATORY_CARE_PROVIDER_SITE_OTHER): Payer: Medicare Other

## 2017-12-02 DIAGNOSIS — I739 Peripheral vascular disease, unspecified: Secondary | ICD-10-CM

## 2018-01-01 ENCOUNTER — Other Ambulatory Visit: Payer: Self-pay | Admitting: Family Medicine

## 2018-01-02 NOTE — Telephone Encounter (Signed)
Last filled:  10/07/17, #90 Last OV:  01/04/17 Next OV:  none

## 2018-01-11 ENCOUNTER — Other Ambulatory Visit: Payer: Self-pay | Admitting: Family Medicine

## 2018-01-28 ENCOUNTER — Other Ambulatory Visit: Payer: Self-pay | Admitting: Cardiovascular Disease

## 2018-02-21 NOTE — Progress Notes (Signed)
Follow-up Outpatient Visit Date: 02/22/2018  Primary Care Provider: [name], MD [place] [address]  Chief Complaint: Leg pain  HPI:  Craig Avery is a 77 y.o. year-old male with history of  carotid artery stenosis complicated by stroke status post left carotid endarterectomy, hypertension, hyperlipidemia, and obesity, who presents for follow-up of bilateral lower extremity PAD.  I met him in May after screening ultrasound for AAA showed bilateral iliac disease.  He reported claudication while walking more than 200 yards, which had been present for at least a year.  He was otherwise doing well.  We obtained bilateral lower extremity arterial Dopplers with ABIs, which were notable for normal ABIs but 50 to 74% stenosis involving the right CFA and 30 to 49% stenosis involving the right SFA.  30 to 49% stenosis was also noted in the left popliteal artery.  Today, Craig Avery reports that his leg pain is stable to minimally worse than at our last visit.  He is able to walk 200 to 300 yards before needing to stop to rest due to discomfort in his calves.  Interestingly, the discomfort seems to be worse on the left side, though vascular studies showed more significant right CFA and SFA disease.  He denies wounds and rest pain.  He notes rare episodes of brief right-sided chest wall pain lasting a few seconds.  This is not related to any particular activity.  He denies shortness of breath, palpitations, lightheadedness, and edema.  He bleeds easily if he cuts himself but has not noticed any melena, hematochezia, or hematuria.  --------------------------------------------------------------------------------------------------  Cardiovascular History & Procedures: Cardiovascular Problems:  Peripheral vascular disease with claudication  Stroke with carotid artery stenosis  Risk Factors:  Peripheral vascular disease, hypertension, hyperlipidemia, male gender,  obesity, and age greater than 63  Cath/PCI:  None  CV Surgery:  Left carotid endarterectomy (07/21/2012, Dr. Trula Slade)  EP Procedures and Devices:  None  Non-Invasive Evaluation(s):  BLE arterial Dopplers with ABIs (12/02/2017): 50 to 74% stenosis in the right CFA.  30 to 49% stenosis in the right SFA.  30 to 49% stenosis in the left popliteal artery.  ABIs are normal bilaterally.  TBI's are mildly abnormal bilaterally.  AAA screen (09/30/2017): Normal sized abdominal aorta.  Bilateral iliac disease greater than 50%, extending to the proximal common femoral artery on the right.  Exercise MPI (05/05/2017): Low risk study without ischemia.  Apical thinning versus attenuation artifact noted.  LVEF 67%.  Carotid Doppler (02/28/2017): Right internal carotid artery with 1 to 39% stenosis.  Patent left carotid endarterectomy site without restenosis.  Right external carotid artery stenosis noted.  Overall, no change since 02/2016.  Recent CV Pertinent Labs: Lab Results  Component Value Date   CHOL 123 01/07/2017   CHOL 140 09/25/2012   HDL 44.20 01/07/2017   HDL 47 09/25/2012   LDLCALC 63 01/07/2017   LDLCALC 72 09/25/2012   LDLDIRECT 74.0 07/07/2016   TRIG 81.0 01/07/2017   CHOLHDL 3 01/07/2017   INR 0.91 07/14/2012   K 4.4 02/10/2017   BUN 30 (H) 02/10/2017   CREATININE 1.59 (H) 02/10/2017    Past medical and surgical history were reviewed and updated in EPIC.  Current Meds  Medication Sig  . amLODipine (NORVASC) 10 MG tablet TAKE 1 TABLET BY MOUTH EVERY DAY  . aspirin 81 MG tablet Take 81 mg by mouth daily.  Marland Kitchen atorvastatin (LIPITOR) 20 MG tablet TAKE 1 TABLET BY MOUTH DAILY AT 6PM  .  clopidogrel (PLAVIX) 75 MG tablet TAKE 1 TABLET BY MOUTH ONCE A DAY  . Cyanocobalamin (VITAMIN B 12 PO) Take by mouth daily.  Marland Kitchen docusate sodium (COLACE) 100 MG capsule Take 100 mg by mouth daily as needed for mild constipation. Reported on 07/31/2015  . lisinopril-hydrochlorothiazide  (PRINZIDE,ZESTORETIC) 20-12.5 MG tablet Take 1 tablet by mouth daily.  . Multiple Vitamins-Minerals (CENTRUM SILVER ADULT 50+ PO) Take 1 tablet by mouth daily.    Allergies: Patient has no known allergies.  Social History   Tobacco Use  . Smoking status: Former Smoker    Packs/day: 1.50    Years: 33.00    Pack years: 49.50    Types: Cigarettes    Last attempt to quit: 06/18/1994    Years since quitting: 23.6  . Smokeless tobacco: Never Used  Substance Use Topics  . Alcohol use: Yes    Alcohol/week: 10.0 - 12.0 standard drinks    Types: 10 - 12 Cans of beer per week  . Drug use: No    Family History  Problem Relation Age of Onset  . CAD Brother 61       MI  . Heart disease Brother        before age 65  . Heart attack Brother   . Hypertension Mother   . Cirrhosis Father 31       EtOHic  . Aneurysm Paternal Uncle 51  . Heart disease Daughter   . Hyperlipidemia Daughter     Review of Systems: A 12-system review of systems was performed and was negative except as noted in the HPI.  --------------------------------------------------------------------------------------------------  Physical Exam: BP (!) 152/64 (BP Location: Left Arm, Patient Position: Sitting, Cuff Size: Normal)   Ht _0  (1.626 m)   Wt 192 lb 4 oz (87.2 kg)   BMI 33.00 kg/m   General: NAD. HEENT: No conjunctival pallor or scleral icterus. Moist mucous membranes.  OP clear. Neck: Supple without lymphadenopathy, thyromegaly, JVD, or HJR. Lungs: Normal work of breathing. Clear to auscultation bilaterally without wheezes or crackles. Heart: Regular rate and rhythm without murmurs, rubs, or gallops. Abd: Bowel sounds present. Soft, NT/ND. Ext: No lower extremity edema.  2+ radial pulses.  1+ pedal pulses in both feet.   Skin: Warm and dry without rash.  EKG: Sinus bradycardia (heart rate 58 bpm).  Otherwise, no significant abnormality.  Lab Results  Component Value Date   WBC 9.0 01/07/2017   HGB  12.0 (L) 01/07/2017   HCT 34.6 (L) 01/07/2017   MCV 91.2 01/07/2017   PLT 241.0 01/07/2017    Lab Results  Component Value Date   NA 141 02/10/2017   K 4.4 02/10/2017   CL 106 02/10/2017   CO2 29 02/10/2017   BUN 30 (H) 02/10/2017   CREATININE 1.59 (H) 02/10/2017   GLUCOSE 114 (H) 02/10/2017   ALT 23 01/07/2017    Lab Results  Component Value Date   CHOL 123 01/07/2017   HDL 44.20 01/07/2017   LDLCALC 63 01/07/2017   LDLDIRECT 74.0 07/07/2016   TRIG 81.0 01/07/2017   CHOLHDL 3 01/07/2017    --------------------------------------------------------------------------------------------------  ASSESSMENT AND PLAN: Claudication Stable to minimally progressive symptoms since May.  ABIs and arterial Dopplers showed bilateral moderate disease, interestingly more severe on the right despite patient complaining of more pain in the left calf.  ABIs were normal, though TBI's were mildly diminished suggestive of small vessel disease.  The patient does not have any signs or symptoms of critical limb ischemia.  We have discussed regular walking program, medical therapy with cilostazol, and angiography.  Due to CKD, we have agreed to defer angiography.  Craig Avery would like to increase his walking.  We will provide him with information on a regular walking regimen.  If his symptoms continue or worsen, our next option would be to add cilostazol.  Hypertension Blood pressure modestly elevated today.  I encouraged lifestyle modifications.  He should continue his current medications and follow-up with Drs. Rockey Situ and Danise Mina.  Hyperlipidemia Most recent LDL a year ago at goal.  Defer repeat lipid studies to doctors Rockey Situ and Danise Mina.  Craig Avery should continue his current dose of atorvastatin.  Follow-up: Return to clinic in 6 months.  Nelva Bush, MD 02/22/2018 9:01 AM

## 2018-02-22 ENCOUNTER — Ambulatory Visit (INDEPENDENT_AMBULATORY_CARE_PROVIDER_SITE_OTHER): Payer: Medicare Other | Admitting: Internal Medicine

## 2018-02-22 ENCOUNTER — Encounter: Payer: Self-pay | Admitting: Internal Medicine

## 2018-02-22 VITALS — BP 152/64 | Ht 64.0 in | Wt 192.2 lb

## 2018-02-22 DIAGNOSIS — E785 Hyperlipidemia, unspecified: Secondary | ICD-10-CM

## 2018-02-22 DIAGNOSIS — I739 Peripheral vascular disease, unspecified: Secondary | ICD-10-CM

## 2018-02-22 DIAGNOSIS — I10 Essential (primary) hypertension: Secondary | ICD-10-CM | POA: Diagnosis not present

## 2018-02-22 NOTE — Patient Instructions (Signed)
Medication Instructions:  Your physician recommends that you continue on your current medications as directed. Please refer to the Current Medication list given to you today.   Labwork: none  Testing/Procedures: nonE  Follow-Up: Your physician recommends that you schedule a follow-up appointment in: [place].   If you need a refill on your cardiac medications before your next appointment, please call your pharmacy.    WALKING  Walking is a great form of exercise to increase your strength, endurance and overall fitness.  A walking program can help you start slowly and gradually build endurance as you go.  Everyone's ability is different, so each person's starting point will be different.  You do not have to follow them exactly.  The are just samples. You should simply find out what's right for you and stick to that program.   In the beginning, you'll start off walking 2-3 times a day for short distances.  As you get stronger, you'll be walking further at just 1-2 times per day.  A. You Can Walk For A Certain Length Of Time Each Day    Walk 5 minutes 3 times per day.  Increase 2 minutes every 2 days (3 times per day).  Work up to 25-30 minutes (1-2 times per day).   Example:   Day 1-2 5 minutes 3 times per day   Day 7-8 12 minutes 2-3 times per day   Day 13-14 25 minutes 1-2 times per day  B. You Can Walk For a Certain Distance Each Day     Distance can be substituted for time.    Example:   3 trips to mailbox (at road)   3 trips to corner of block   3 trips around the block

## 2018-03-16 ENCOUNTER — Other Ambulatory Visit: Payer: Self-pay | Admitting: Cardiovascular Disease

## 2018-06-27 ENCOUNTER — Other Ambulatory Visit: Payer: Self-pay | Admitting: Family Medicine

## 2018-06-27 NOTE — Telephone Encounter (Signed)
Plavix Last filled:  03/31/18, #90/1 Last OV:  01/04/17, f/u Next OV:  none

## 2018-09-10 ENCOUNTER — Telehealth: Payer: Self-pay | Admitting: Family Medicine

## 2018-09-11 NOTE — Telephone Encounter (Signed)
E-scribed refill.  Pls schedule annual wellness visit.

## 2018-09-12 NOTE — Telephone Encounter (Signed)
Medicare wellness 5/5 cpx with dr g 5/12 Pt aware

## 2018-09-12 NOTE — Telephone Encounter (Signed)
Noted  

## 2018-09-22 ENCOUNTER — Other Ambulatory Visit: Payer: Self-pay | Admitting: Family Medicine

## 2018-09-22 NOTE — Telephone Encounter (Signed)
Plavix Last filled:  06/27/18, #90 Last OV:  01/04/17, CPE Pt 2 Next OV:  11/21/18, CPE Pt 2

## 2018-09-29 ENCOUNTER — Ambulatory Visit: Payer: Medicare Other | Admitting: Family

## 2018-09-29 ENCOUNTER — Encounter (HOSPITAL_COMMUNITY): Payer: Medicare Other

## 2018-11-06 ENCOUNTER — Encounter (HOSPITAL_COMMUNITY): Payer: Medicare Other

## 2018-11-06 ENCOUNTER — Ambulatory Visit: Payer: Medicare Other | Admitting: Family

## 2018-11-10 ENCOUNTER — Other Ambulatory Visit (INDEPENDENT_AMBULATORY_CARE_PROVIDER_SITE_OTHER): Payer: Medicare Other

## 2018-11-10 ENCOUNTER — Other Ambulatory Visit: Payer: Self-pay

## 2018-11-10 ENCOUNTER — Ambulatory Visit (INDEPENDENT_AMBULATORY_CARE_PROVIDER_SITE_OTHER): Payer: Medicare Other

## 2018-11-10 DIAGNOSIS — Z0000 Encounter for general adult medical examination without abnormal findings: Secondary | ICD-10-CM | POA: Diagnosis not present

## 2018-11-10 DIAGNOSIS — R739 Hyperglycemia, unspecified: Secondary | ICD-10-CM

## 2018-11-10 DIAGNOSIS — N183 Chronic kidney disease, stage 3 unspecified: Secondary | ICD-10-CM

## 2018-11-10 DIAGNOSIS — I10 Essential (primary) hypertension: Secondary | ICD-10-CM

## 2018-11-10 DIAGNOSIS — E785 Hyperlipidemia, unspecified: Secondary | ICD-10-CM

## 2018-11-10 LAB — LIPID PANEL
Cholesterol: 151 mg/dL (ref 0–200)
HDL: 44.2 mg/dL (ref >39.00)
LDL Cholesterol: 84 mg/dL (ref 0–99)
NonHDL: 106.8
Total CHOL/HDL Ratio: 3
Triglycerides: 116 mg/dL (ref 0.0–149.0)
VLDL: 23.2 mg/dL (ref 0.0–40.0)

## 2018-11-10 LAB — CBC WITH DIFFERENTIAL/PLATELET
Basophils Absolute: 0 10*3/uL (ref 0.0–0.1)
Basophils Relative: 0.3 % (ref 0.0–3.0)
Eosinophils Absolute: 0.1 10*3/uL (ref 0.0–0.7)
Eosinophils Relative: 1 % (ref 0.0–5.0)
HCT: 36.4 % — ABNORMAL LOW (ref 39.0–52.0)
Hemoglobin: 12.4 g/dL — ABNORMAL LOW (ref 13.0–17.0)
Lymphocytes Relative: 55.8 % — ABNORMAL HIGH (ref 12.0–46.0)
Lymphs Abs: 5.3 10*3/uL — ABNORMAL HIGH (ref 0.7–4.0)
MCHC: 34.1 g/dL (ref 30.0–36.0)
MCV: 92.1 fl (ref 78.0–100.0)
Monocytes Absolute: 0.5 10*3/uL (ref 0.1–1.0)
Monocytes Relative: 5.5 % (ref 3.0–12.0)
Neutro Abs: 3.6 10*3/uL (ref 1.4–7.7)
Neutrophils Relative %: 37.4 % — ABNORMAL LOW (ref 43.0–77.0)
Platelets: 242 10*3/uL (ref 150.0–400.0)
RBC: 3.96 Mil/uL — ABNORMAL LOW (ref 4.22–5.81)
RDW: 12.8 % (ref 11.5–15.5)
WBC: 9.5 10*3/uL (ref 4.0–10.5)

## 2018-11-10 LAB — COMPREHENSIVE METABOLIC PANEL
ALT: 19 U/L (ref 0–53)
AST: 17 U/L (ref 0–37)
Albumin: 4.4 g/dL (ref 3.5–5.2)
Alkaline Phosphatase: 49 U/L (ref 39–117)
BUN: 31 mg/dL — ABNORMAL HIGH (ref 6–23)
CO2: 26 mEq/L (ref 19–32)
Calcium: 9.1 mg/dL (ref 8.4–10.5)
Chloride: 102 mEq/L (ref 96–112)
Creatinine, Ser: 2.09 mg/dL — ABNORMAL HIGH (ref 0.40–1.50)
GFR: 30.74 mL/min — ABNORMAL LOW (ref >60.00)
Glucose, Bld: 97 mg/dL (ref 70–99)
Potassium: 4.5 mEq/L (ref 3.5–5.1)
Sodium: 138 mEq/L (ref 135–145)
Total Bilirubin: 0.6 mg/dL (ref 0.2–1.2)
Total Protein: 6.8 g/dL (ref 6.0–8.3)

## 2018-11-10 LAB — TSH: TSH: 4.71 u[IU]/mL — ABNORMAL HIGH (ref 0.35–4.50)

## 2018-11-10 LAB — HEMOGLOBIN A1C: Hgb A1c MFr Bld: 5.6 % (ref 4.6–6.5)

## 2018-11-10 NOTE — Progress Notes (Signed)
Subjective:   Craig Avery is a 78 y.o. male who presents for Medicare Annual/Subsequent preventive examination.  Review of Systems:  N/A Cardiac Risk Factors include: advanced age (>56men, >32 women);dyslipidemia;hypertension;male gender;obesity (BMI >30kg/m2)     Objective:    Vitals: There were no vitals taken for this visit.  There is no height or weight on file to calculate BMI.  Advanced Directives 11/10/2018 03/07/2017 01/04/2017 02/23/2016 02/17/2015 11/16/2014 02/11/2014  Does Patient Have a Medical Advance Directive? No No No No No No -  Would patient like information on creating a medical advance directive? No - Patient declined - - Yes - Scientist, clinical (histocompatibility and immunogenetics) given Yes - Scientist, clinical (histocompatibility and immunogenetics) given - -  Pre-existing out of facility DNR order (yellow form or pink MOST form) - - - - - - No    Tobacco Social History   Tobacco Use  Smoking Status Former Smoker  . Packs/day: 1.50  . Years: 33.00  . Pack years: 49.50  . Types: Cigarettes  . Last attempt to quit: 06/18/1994  . Years since quitting: 24.4  Smokeless Tobacco Never Used     Counseling given: No   Clinical Intake:  Pre-visit preparation completed: Yes  Pain : No/denies pain Pain Score: 0-No pain     Nutritional Status: BMI 25 -29 Overweight Diabetes: No  How often do you need to have someone help you when you read instructions, pamphlets, or other written materials from your doctor or pharmacy?: 1 - Never What is the last grade level you completed in school?: 8th grade  Interpreter Needed?: No  Comments: pt is a widow and lives independently Information entered by :: LPinson, LPN  Past Medical History:  Diagnosis Date  . Carotid stenosis 01/2013   bilateral, s/p L CEA, plan rpt [place] qyear  . History of chicken pox   . HLD (hyperlipidemia)   . HTN (hypertension)   . 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 Obesity   . Stroke [facility])  05/2012   R PCA territory, presented with L paresthesias   Past Surgical History:  Procedure Laterality Date  . CAROTID ENDARTERECTOMY Left 07-21-12   cea  . CATARACT EXTRACTION  2004   bilateral  . COLONOSCOPY WITH PROPOFOL N/A 06/24/2015   3 polyps, one with high grade dysplasia, rpt 3 mo; [name], MD  . COLONOSCOPY WITH PROPOFOL N/A 10/14/2015   hyperplastic polyp; f/u PRN; [name], MD  . ENDARTERECTOMY  07/21/2012   Left CEA; Surgeon: [name], MD  . EYE SURGERY    . MELANOMA EXCISION  12/30/2016   right side of head above ear  . PATCH ANGIOPLASTY  07/21/2012   Procedure: PATCH ANGIOPLASTY;  Surgeon: [name], MD;  Location: [facility] OR;  Service: Vascular;  Laterality: Left;  Using 1 cm x 6 cm Vascu Guard patch   . TEE WITHOUT CARDIOVERSION  05/22/2012   Procedure: TRANSESOPHAGEAL ECHOCARDIOGRAM (TEE);  Surgeon: [name], MD;  Location: [facility] ENDOSCOPY;  Service: Cardiovascular;  Laterality: N/A;  . WISDOM TOOTH EXTRACTION     Family History  Problem Relation Age of Onset  . CAD Brother 48       MI  . Heart disease Brother        before age 63  . Heart attack Brother   . Hypertension Mother   . Cirrhosis Father 67       EtOHic  . Aneurysm Paternal  Uncle 50  . Heart disease Daughter   . Hyperlipidemia Daughter    Social History   Socioeconomic History  . Marital status: Married    Spouse name: Not on file  . Number of children: Not on file  . Years of education: Not on file  . Highest education level: Not on file  Occupational History  . Not on file  Social Needs  . Financial resource strain: Not on file  . Food insecurity:    Worry: Not on file    Inability: Not on file  . Transportation needs:    Medical: Not on file    Non-medical: Not on file  Tobacco Use  . Smoking status: Former Smoker    Packs/day: 1.50    Years: 33.00    Pack years: 49.50    Types: Cigarettes    Last attempt to quit: 06/18/1994    Years since quitting: 24.4  .  Smokeless tobacco: Never Used  Substance and Sexual Activity  . Alcohol use: Yes    Alcohol/week: 4.0 - 5.0 standard drinks    Types: 4 - 5 Cans of beer per week  . Drug use: No  . Sexual activity: Yes  Lifestyle  . Physical activity:    Days per week: Not on file    Minutes per session: Not on file  . Stress: Not on file  Relationships  . Social connections:    Talks on phone: Not on file    Gets together: Not on file    Attends religious service: Not on file    Active member of club or organization: Not on file    Attends meetings of clubs or organizations: Not on file    Relationship status: Not on file  Other Topics Concern  . Not on file  Social History Narrative   Caffeine: light coffee in am, working on cutting back   Lives with wife, 1 dog.  Daughter next door.  Other children nearby.   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   Diet: not much water, some fruits/vegetables       Advanced directives - does not want prolonged life support if irreversible damage. Would want wife and daughters to be HCPOA. Requests info today.    Outpatient Encounter Medications as of 11/10/2018  Medication Sig  . amLODipine (NORVASC) 10 MG tablet TAKE 1 TABLET BY MOUTH EVERY DAY  . aspirin 81 MG tablet Take 81 mg by mouth daily.  Marland Kitchen atorvastatin (LIPITOR) 20 MG tablet TAKE 1 TABLET BY MOUTH DAILY AT 6PM  . clopidogrel (PLAVIX) 75 MG tablet TAKE 1 TABLET BY MOUTH EVERY DAY  . Cyanocobalamin (VITAMIN B 12 PO) Take by mouth daily.  Marland Kitchen docusate sodium (COLACE) 100 MG capsule Take 100 mg by mouth daily as needed for mild constipation. Reported on 07/31/2015  . lisinopril-hydrochlorothiazide (PRINZIDE,ZESTORETIC) 20-12.5 MG tablet TAKE 1 TABLET BY MOUTH 2 (TWO) TIMES DAILY.  . Multiple Vitamins-Minerals (CENTRUM SILVER ADULT 50+ PO) Take 1 tablet by mouth daily.   No facility-administered encounter  medications on file as of 11/10/2018.     Activities of Daily Living In your present state of health, do you have any difficulty performing the following activities: 11/10/2018  Hearing? N  Vision? N  Difficulty concentrating or making decisions? Y  Walking or climbing stairs? Y  Comment calf pain  Dressing or bathing? N  Doing errands, shopping? N  Preparing Food and eating ? N  Using the Toilet? N  In the past six months, have you accidently leaked urine? N  Do you have problems with loss of bowel control? N  Managing your Medications? N  Managing your Finances? N  Housekeeping or managing your Housekeeping? N  Some recent data might be hidden    Patient Care Team: [name], MD as PCP - General (Family Medicine) Minna Merritts, MD as Attending Physician (Cardiology)   Assessment:   This is a routine wellness examination for [place].  Vision Screening Comments: Vision exam approx. 2 yrs/[place]  Exercise Activities and Dietary recommendations Current Exercise Habits: Home exercise routine, Type of exercise: walking, Time (Minutes): 30, Frequency (Times/Week): 7, Weekly Exercise (Minutes/Week): 210, Intensity: Mild, Exercise limited by: None identified  Goals    . Patient Stated     Starting 11/10/18, I will continue to take medications as prescribed.        Fall Risk Fall Risk  11/10/2018 01/04/2017 06/17/2016 05/23/2015 03/25/2014  Falls in the past year? 0 No No No No  Comment - - Emmi Telephone Survey: data to providers prior to load - -   Depression Screen PHQ 2/9 Scores 11/10/2018 01/04/2017 05/23/2015 03/25/2014  PHQ - 2 Score 0 0 0 0  PHQ- 9 Score 0 - - -    Cognitive Function MMSE - Mini Mental State Exam 11/10/2018 01/04/2017  Orientation to time 5 5  Orientation to Place 5 5  Registration 3 3  Attention/ Calculation 0 0  Recall 3 3  Language- name 2 objects 0 0  Language- repeat 1 1  Language- follow 3 step command 0 3  Language- read &  follow direction 0 0  Write a sentence 0 0  Copy design 0 0  Total score 17 20     PLEASE NOTE: A Mini-Cog screen was completed. Maximum score is 17. A value of 0 denotes this part of [place] MMSE was not completed or the patient failed this part of the Mini-Cog screening.   Mini-Cog Screening Orientation to Time - Max 5 pts Orientation to Place - Max 5 pts Registration - Max 3 pts Recall - Max 3 pts Language Repeat - Max 1 pts      Immunization History  Administered Date(s) Administered  . Influenza-Unspecified 03/12/2014  . Tdap 11/16/2014    Screening Tests Health Maintenance  Topic Date Due  . PNA vac Low Risk Adult (1 of 2 - PCV13) 01/04/2049 (Originally 04/11/2004)  . INFLUENZA VACCINE  02/10/2019  . DTaP/Tdap/Td (2 - Td) 11/15/2024  . TETANUS/TDAP  11/15/2024       Plan:   I have personally reviewed, addressed, and noted the following in the patient's chart:  A. Medical and social history B. Use of alcohol, tobacco or illicit drugs  C. Current medications and supplements D. Functional ability and status E.  Nutritional status F.  Physical activity G. Advance directives H. List of other physicians I.  Hospitalizations, surgeries, and ER visits in previous 12 months J.  Vitals (unless it is a telemedicine encounter) K. Screenings to include cognitive, depression, hearing, vision (NOTE: hearing and vision screenings not completed in telemedicine encounter) L. Referrals and appointments   In addition, I have reviewed and discussed with patient certain preventive protocols, quality metrics, and best practice recommendations. A written personalized care plan for preventive services and recommendations were provided to patient.  With patient's permission, we connected on 11/10/18 at  9:30 AM  EDT by a video enabled telemedicine application. Two patient identifiers were used to ensure the encounter occurred with the correct person.    Patient was in home and writer  was in office.   Signed,   Lindell Noe, MHA, BS, LPN Health Coach

## 2018-11-10 NOTE — Progress Notes (Signed)
PCP notes:   Health maintenance:  No gaps identified.  Abnormal screenings:   None  Patient concerns:   None  Nurse concerns:  None  Next PCP appt:   11/17/18 @ 1030

## 2018-11-10 NOTE — Addendum Note (Signed)
Addended by: Ellamae Sia on: 11/10/2018 02:57 PM   Modules accepted: Orders

## 2018-11-10 NOTE — Patient Instructions (Signed)
Craig Avery , Thank you for taking time to come for your Medicare Wellness Visit. I appreciate your ongoing commitment to your health goals. Please review the following plan we discussed and let me know if I can assist you in the future.   These are the goals we discussed: Goals    . Patient Stated     Starting 11/10/18, I will continue to take medications as prescribed.        This is a list of the screening recommended for you and due dates:  Health Maintenance  Topic Date Due  . Pneumonia vaccines (1 of 2 - PCV13) 01/04/2049*  . Flu Shot  02/10/2019  . DTaP/Tdap/Td vaccine (2 - Td) 11/15/2024  . Tetanus Vaccine  11/15/2024  *Topic was postponed. The date shown is not the original due date.   Preventive Care for Adults  A healthy lifestyle and preventive care can promote health and wellness. Preventive health guidelines for adults include the following key practices.  . A routine yearly physical is a good way to check with your health care provider about your health and preventive screening. It is a chance to share any concerns and updates on your health and to receive a thorough exam.  . Visit your dentist for a routine exam and preventive care every 6 months. Brush your teeth twice a day and floss once a day. Good oral hygiene prevents tooth decay and gum disease.  . The frequency of eye exams is based on your age, health, family medical history, use  of contact lenses, and other factors. Follow your health care provider's recommendations for frequency of eye exams.  . Eat a healthy diet. Foods like vegetables, fruits, whole grains, low-fat dairy products, and lean protein foods contain the nutrients you need without too many calories. Decrease your intake of foods high in solid fats, added sugars, and salt. Eat the right amount of calories for you. Get information about a proper diet from your health care provider, if necessary.  . Regular physical exercise is one of the most  important things you can do for your health. Most adults should get at least 150 minutes of moderate-intensity exercise (any activity that increases your heart rate and causes you to sweat) each week. In addition, most adults need muscle-strengthening exercises on 2 or more days a week.  Silver Sneakers may be a benefit available to you. To determine eligibility, you may visit the website: www.silversneakers.com or contact program at (416) 872-2726 Mon-Fri between 8AM-8PM.   . Maintain a healthy weight. The body mass index (BMI) is a screening tool to identify possible weight problems. It provides an estimate of body fat based on height and weight. Your health care provider can find your BMI and can help you achieve or maintain a healthy weight.   For adults 20 years and older: ? A BMI below 18.5 is considered underweight. ? A BMI of 18.5 to 24.9 is normal. ? A BMI of 25 to 29.9 is considered overweight. ? A BMI of 30 and above is considered obese.   . Maintain normal blood lipids and cholesterol levels by exercising and minimizing your intake of saturated fat. Eat a balanced diet with plenty of fruit and vegetables. Blood tests for lipids and cholesterol should begin at age 23 and be repeated every 5 years. If your lipid or cholesterol levels are high, you are over 50, or you are at high risk for heart disease, you may need your cholesterol levels checked more  frequently. Ongoing high lipid and cholesterol levels should be treated with medicines if diet and exercise are not working.  . If you smoke, find out from your health care provider how to quit. If you do not use tobacco, please do not start.  . If you choose to drink alcohol, please do not consume more than 2 drinks per day. One drink is considered to be 12 ounces (355 mL) of beer, 5 ounces (148 mL) of wine, or 1.5 ounces (44 mL) of liquor.  . If you are 20-10 years old, ask your health care provider if you should take aspirin to prevent  strokes.  . Use sunscreen. Apply sunscreen liberally and repeatedly throughout the day. You should seek shade when your shadow is shorter than you. Protect yourself by wearing long sleeves, pants, a wide-brimmed hat, and sunglasses year round, whenever you are outdoors.  . Once a month, do a whole body skin exam, using a mirror to look at the skin on your back. Tell your health care provider of new moles, moles that have irregular borders, moles that are larger than a pencil eraser, or moles that have changed in shape or color.

## 2018-11-11 LAB — MICROALBUMIN / CREATININE URINE RATIO
Creatinine, Urine: 87 mg/dL (ref 20–320)
Microalb Creat Ratio: 6 mcg/mg creat (ref <30)
Microalb, Ur: 0.5 mg/dL

## 2018-11-14 ENCOUNTER — Ambulatory Visit: Payer: Medicare Other

## 2018-11-15 ENCOUNTER — Other Ambulatory Visit: Payer: Medicare Other

## 2018-11-17 ENCOUNTER — Ambulatory Visit (INDEPENDENT_AMBULATORY_CARE_PROVIDER_SITE_OTHER): Payer: Medicare Other | Admitting: Family Medicine

## 2018-11-17 ENCOUNTER — Encounter: Payer: Self-pay | Admitting: Family Medicine

## 2018-11-17 VITALS — Ht 64.0 in

## 2018-11-17 DIAGNOSIS — N183 Chronic kidney disease, stage 3 unspecified: Secondary | ICD-10-CM

## 2018-11-17 DIAGNOSIS — E669 Obesity, unspecified: Secondary | ICD-10-CM | POA: Diagnosis not present

## 2018-11-17 DIAGNOSIS — I6523 Occlusion and stenosis of bilateral carotid arteries: Secondary | ICD-10-CM | POA: Diagnosis not present

## 2018-11-17 DIAGNOSIS — I10 Essential (primary) hypertension: Secondary | ICD-10-CM

## 2018-11-17 DIAGNOSIS — E785 Hyperlipidemia, unspecified: Secondary | ICD-10-CM

## 2018-11-17 DIAGNOSIS — I739 Peripheral vascular disease, unspecified: Secondary | ICD-10-CM

## 2018-11-17 DIAGNOSIS — Z8673 Personal history of transient ischemic attack (TIA), and cerebral infarction without residual deficits: Secondary | ICD-10-CM | POA: Diagnosis not present

## 2018-11-17 NOTE — Assessment & Plan Note (Signed)
Has been unable to check recently - but commits to buying BP cuff to start monitoring more regularly.

## 2018-11-17 NOTE — Assessment & Plan Note (Signed)
Chronic, stable. Continue atorvastatin. The 10-year ASCVD risk score Mikey Bussing DC Brooke Bonito., et al., 2013) is: 43.9%   Values used to calculate the score:     Age: 78 years     Sex: Male     Is Non-Hispanic African American: No     Diabetic: No     Tobacco smoker: No     Systolic Blood Pressure: 973 mmHg     Is BP treated: Yes     HDL Cholesterol: 44.2 mg/dL     Total Cholesterol: 151 mg/dL

## 2018-11-17 NOTE — Assessment & Plan Note (Signed)
This is followed by [facility] ?

## 2018-11-17 NOTE — Assessment & Plan Note (Addendum)
Deteriorated - discussed this. Anticipate hypertensive nephropathy. Also has poor water intake and drinks lots of dark sodas. Will work on increased water intake, limiting sodas, start monitoring BP more regularly and notify me if persistently elevated for med change.

## 2018-11-17 NOTE — Patient Instructions (Signed)
Kidneys were impaired on labs - increase water intake, back down on dark sodas, start monitoring blood pressures more closely.  If blood pressure is consistently >140/90, let me know. We may bring you in sooner or changes blood pressure medicine.  Otherwise follow up in 6 months for next office visit. Call cardiology about schedule follow up appointment with them.

## 2018-11-17 NOTE — Progress Notes (Signed)
Virtual visit completed through [employer]. Due to national recommendations of social distancing due to [place] 19, a virtual visit is felt to be most appropriate for this patient at this time. Interactive audio and video telecommunications were attempted between myself and REYNOLDO MAINER, however failed due to patient having technical difficulties. We continued and completed visit with audio only.  Time: 10:39am - 11:00am   Patient location: home Provider location: [place] at [facility], office If any vitals were documented, they were collected by patient at home unless specified below.    Ht 5\' 4"  (1.626 m)   BMI 33.00 kg/m    CC: AMW f/u visit Subjective:    Patient ID: KAMARI BUCH, male    DOB: March 31, 1941, 78 y.o.   MRN: [id number]  HPI: BULMARO FEAGANS is a 78 y.o. male presenting on 11/17/2018 for Annual Exam (Pt 2. )   Saw Katha Cabal last week for medicare wellness visit. Note reviewed. He has been feeling well.  Known claudication followed by Dr End - advised due for f/u.  Vascular appt rescheduled to Oct 2020.   Preventative: COLONOSCOPY WITH PROPOFOL 06/24/2015 3 polyps, one with high grade dysplasia, rpt 3 mo; [name], MD COLONOSCOPY WITH PROPOFOL 10/14/2015 hyperplastic polyp; f/u PRN; [name], MD - aged out.  Prostate cancer screening - aged out  Needle phobia  Flu shot - declines  Pneumovax - discussed, declines  Tdap 11/2014  zostavax - declines  Advanced directives - does not want prolonged life support if irreversible damage. Would want wife and daughters to be HCPOA. Packet previously provided. Seat belt use discussed.  Sunscreen use discussed. Recent BCC removed R scalp at [facility]. Overdue for derm.  Ex smoker - quit 1995  Alcohol - 1-2 drinks/day, not every day  Dentist - full dentures but doesn't use  Eye exam yearly - postponed  Bowels - no constipation  Bladder - no incontinence   Caffeine: light coffee in am, working on cutting back Lives with  wife, 1 dog. Daughter next door. Other children nearby. 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 Interim medical history since our last visit reviewed. Allergies and medications reviewed and updated. Outpatient Medications Prior to Visit  Medication Sig Dispense Refill  . amLODipine (NORVASC) 10 MG tablet TAKE 1 TABLET BY MOUTH EVERY DAY 90 tablet 0  . aspirin 81 MG tablet Take 81 mg by mouth daily.    Marland Kitchen atorvastatin (LIPITOR) 20 MG tablet TAKE 1 TABLET BY MOUTH DAILY AT 6PM 90 tablet 3  . clopidogrel (PLAVIX) 75 MG tablet TAKE 1 TABLET BY MOUTH EVERY DAY 90 tablet 0  . Cyanocobalamin (VITAMIN B 12 PO) Take by mouth daily.    Marland Kitchen docusate sodium (COLACE) 100 MG capsule Take 100 mg by mouth daily as needed for mild constipation. Reported on 07/31/2015    . lisinopril-hydrochlorothiazide (PRINZIDE,ZESTORETIC) 20-12.5 MG tablet TAKE 1 TABLET BY MOUTH 2 (TWO) TIMES DAILY. 180 tablet 3  . Multiple Vitamins-Minerals (CENTRUM SILVER ADULT 50+ PO) Take 1 tablet by mouth daily.     No facility-administered medications prior to visit.      Per HPI unless specifically indicated in ROS section below Review of Systems Objective:    Ht 5\' 4"  (1.626 m)   BMI 33.00  kg/m   Wt Readings from Last 3 Encounters:  02/22/18 192 lb 4 oz (87.2 kg)  11/16/17 192 lb 12 oz (87.4 kg)  09/19/17 192 lb (87.1 kg)     Physical exam: Gen: alert, NAD, not ill appearing Pulm: speaks in complete sentences without increased work of breathing Psych: normal mood, normal thought content      Results for orders placed or performed in visit on 11/10/18  Microalbumin / creatinine urine ratio  Result Value Ref Range   Creatinine, Urine 87 20 - 320 mg/dL   Microalb, Ur 0.5 mg/dL   Microalb  Creat Ratio 6 <30 mcg/mg creat   Assessment & Plan:   Problem List Items Addressed This Visit    PVD (peripheral vascular disease) with claudication ([place])    Followed by [facility] and cards. Overdue for cards f/u - he will call to schedule. Continue aspirin, plavix. Discussed importance of good BP control.      Obesity, Class I, BMI 30-34.9   Hyperlipidemia LDL goal <70    Chronic, stable. Continue atorvastatin. The 10-year ASCVD risk score Mikey Bussing DC Brooke Bonito., et al., 2013) is: 43.9%   Values used to calculate the score:     Age: 78 years     Sex: Male     Is Non-Hispanic African American: No     Diabetic: No     Tobacco smoker: No     Systolic Blood Pressure: 341 mmHg     Is BP treated: Yes     HDL Cholesterol: 44.2 mg/dL     Total Cholesterol: 151 mg/dL       HTN (hypertension) (Chronic)    Has been unable to check recently - but commits to buying BP cuff to start monitoring more regularly.      History of stroke   CKD (chronic kidney disease) stage 3, GFR 30-59 ml/min (HCC) - Primary    Deteriorated - discussed this. Anticipate hypertensive nephropathy. Also has poor water intake and drinks lots of dark sodas. Will work on increased water intake, limiting sodas, start monitoring BP more regularly and notify me if persistently elevated for med change.       Carotid stenosis, bilateral    This is followed by [facility].          No orders of the defined types were placed in this encounter.  No orders of the defined types were placed in this encounter.   Patient Instructions  Kidneys were impaired on labs - increase water intake, back down on dark sodas, start monitoring blood pressures more closely.  If blood pressure is consistently >140/90, let me know. We may bring you in sooner or changes blood pressure medicine.  Otherwise follow up in 6 months for next office visit. Call cardiology about schedule follow up appointment with them.     Follow up plan: Return in about 6 months  (around 05/20/2019) for follow up visit.  [name], MD

## 2018-11-17 NOTE — Assessment & Plan Note (Signed)
Followed by [facility] and cards. Overdue for cards f/u - he will call to schedule. Continue aspirin, plavix. Discussed importance of good BP control.

## 2018-11-21 ENCOUNTER — Encounter: Payer: Medicare Other | Admitting: Family Medicine

## 2018-12-03 ENCOUNTER — Other Ambulatory Visit: Payer: Self-pay | Admitting: Family Medicine

## 2018-12-18 ENCOUNTER — Other Ambulatory Visit: Payer: Self-pay | Admitting: Family Medicine

## 2018-12-21 ENCOUNTER — Telehealth: Payer: Self-pay | Admitting: *Deleted

## 2018-12-21 NOTE — Progress Notes (Deleted)
{Choose 1 Note Type (Telehealth Visit or Telephone Visit):[phone]}   Date:  12/21/2018   ID:  Craig Avery, DOB September 11, 1940, MRN [id number]  {Patient Location:[id number]::"Home"} {Provider Location:[phone]::"Home"}  PCP:  [name], MD  Cardiologist:  Ida Rogue, MD, Nelva Bush, MD (PV) Electrophysiologist:  None   Evaluation Performed:  Follow-Up Visit  Chief Complaint:  ***  History of Present Illness:    Craig Avery is a 78 y.o. male with history of carotid artery stenosis complicated by stroke status post left carotid endarterectomy, hypertension, hyperlipidemia, and obesity.  We are speaking today for follow-up of his lower extremity PAD and claudication.  I last saw him in 02/2018, at which time he reported stable to minimally worse calf pain with ambulation.  We discussed proceeding with angiography but agreed to defer this given relatively stable symptoms and comorbidities (particularly CKD).  I encouraged him to increase his walking.  The patient {does/does not:200015} have symptoms concerning for COVID-19 infection (fever, chills, cough, or new shortness of breath).    Past Medical History:  Diagnosis Date  . Carotid stenosis 01/2013   bilateral, s/p L CEA, plan rpt [place] qyear  . History of chicken pox   . HLD (hyperlipidemia)   . HTN (hypertension)   . 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 Obesity   . Stroke [facility]) 05/2012   R PCA territory, presented with L paresthesias   Past Surgical History:  Procedure Laterality Date  . CAROTID ENDARTERECTOMY Left 07-21-12   cea  . CATARACT EXTRACTION  2004   bilateral  . COLONOSCOPY WITH PROPOFOL N/A 06/24/2015   3 polyps, one with high grade dysplasia, rpt 3 mo; [name], MD  . COLONOSCOPY WITH PROPOFOL N/A 10/14/2015   hyperplastic polyp; f/u PRN; [name], MD  . ENDARTERECTOMY  07/21/2012   Left CEA; Surgeon: [name], MD  . EYE SURGERY    . MELANOMA EXCISION  12/30/2016   right side of head above ear  . PATCH ANGIOPLASTY  07/21/2012   Procedure: PATCH ANGIOPLASTY;  Surgeon: [name], MD;  Location: [facility] OR;  Service: Vascular;  Laterality: Left;  Using 1 cm x 6 cm Vascu Guard patch   . TEE WITHOUT CARDIOVERSION  05/22/2012   Procedure: TRANSESOPHAGEAL ECHOCARDIOGRAM (TEE);  Surgeon: [name], MD;  Location: [facility] ENDOSCOPY;  Service: Cardiovascular;  Laterality: N/A;  . WISDOM TOOTH EXTRACTION       No outpatient medications have been marked as taking for the 12/22/18 encounter (Appointment) with Reznor Ferrando, Harrell Gave, MD.     Allergies:   Patient has no known allergies.   Social History   Tobacco Use  . Smoking status: Former Smoker    Packs/day: 1.50    Years: 33.00    Pack years: 49.50    Types: Cigarettes    Quit date: 06/18/1994    Years since quitting: 24.5  . Smokeless tobacco: Never Used  Substance Use Topics  . Alcohol use: Yes    Alcohol/week: 4.0 - 5.0 standard drinks    Types: 4 - 5 Cans of beer per week  . Drug use: No     Family Hx: The patient's family history includes Aneurysm (age of onset: 73) in his paternal uncle; CAD (age of onset: 62) in his brother; Cirrhosis (age of onset: 51) in his father; Heart attack in his brother; Heart disease in his brother and daughter; Hyperlipidemia in his  daughter; Hypertension in his mother.  ROS:   Please see the history of present illness.    *** All other systems reviewed and are negative.   Prior CV studies:   The following studies were reviewed today:   BLE arterial Dopplers with ABIs (12/02/2017): 50 to 74% stenosis in the right CFA.  30 to 49% stenosis in the right SFA.  30 to 49% stenosis in the left popliteal artery.  ABIs are normal bilaterally.  TBI's are mildly abnormal bilaterally.  AAA screen (09/30/2017): Normal sized abdominal aorta. 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  Exercise MPI (05/05/2017): Low risk study without ischemia. Apical thinning versus attenuation artifact noted. LVEF 67%.  Carotid Doppler (02/28/2017): Right internal carotid artery with 1 to 39% stenosis. Patent left carotid endarterectomy site without restenosis. Right external carotid artery stenosis noted. Overall, no change since 02/2016.  Labs/Other Tests and Data Reviewed:    EKG:  {EKG/Telemetry Strips [phone]}  Recent Labs: 11/10/2018: ALT 19; BUN 31; Creatinine, Ser 2.09; Hemoglobin 12.4; Platelets 242.0; Potassium 4.5; Sodium 138; TSH 4.71   Recent Lipid Panel Lab Results  Component Value Date/Time   CHOL 151 11/10/2018 11:03 AM   CHOL 140 09/25/2012 08:05 AM   TRIG 116.0 11/10/2018 11:03 AM   HDL 44.20 11/10/2018 11:03 AM   HDL 47 09/25/2012 08:05 AM   CHOLHDL 3 11/10/2018 11:03 AM   LDLCALC 84 11/10/2018 11:03 AM   LDLCALC 72 09/25/2012 08:05 AM   LDLDIRECT 74.0 07/07/2016 10:04 AM    Wt Readings from Last 3 Encounters:  02/22/18 192 lb 4 oz (87.2 kg)  11/16/17 192 lb 12 oz (87.4 kg)  09/19/17 192 lb (87.1 kg)     Objective:    Vital Signs:  There were no vitals taken for this visit.   {HeartCare Virtual Exam (Optional):[phone]::"VITAL SIGNS:  reviewed"}  ASSESSMENT & PLAN:    1. ***  COVID-19 Education: The signs and symptoms of COVID-19 were discussed with the patient and how to seek care for testing (follow up with PCP or arrange E-visit).  ***The importance of social distancing was discussed today.  Time:   Today, I have spent *** minutes with the patient with telehealth technology discussing the above problems.     Medication Adjustments/Labs and Tests Ordered: Current medicines are reviewed at length with the patient today.  Concerns regarding medicines are outlined above.   Tests Ordered: No orders of the defined types were placed in this encounter.   Medication Changes: No orders of the  defined types were placed in this encounter.   Disposition:  Follow up {follow up:15908}  Signed, Nelva Bush, MD  12/21/2018 10:45 AM    [facility]

## 2018-12-21 NOTE — Telephone Encounter (Signed)
    COVID-19 Pre-Screening Questions:  . In the past 7 to 10 days have you had a cough,  shortness of breath, headache, congestion, fever (100 or greater) body aches, chills, sore throat, or sudden loss of taste or sense of smell? NO . Have you been around anyone with known Covid 19. NO . Have you been around anyone who is awaiting Covid 19 test results in the past 7 to 10 days? no . Have you been around anyone who has been exposed to Covid 19, or has mentioned symptoms of Covid 19 within the past 7 to 10 days? no  If you have any concerns/questions about symptoms patients report during screening (either on the phone or at threshold). Contact the provider seeing the patient or DOD for further guidance.  If neither are available contact a member of the leadership team.

## 2018-12-22 ENCOUNTER — Ambulatory Visit: Payer: Medicare Other | Admitting: Internal Medicine

## 2018-12-22 ENCOUNTER — Other Ambulatory Visit: Payer: Self-pay

## 2018-12-22 ENCOUNTER — Telehealth: Payer: Self-pay | Admitting: Internal Medicine

## 2018-12-22 DIAGNOSIS — I739 Peripheral vascular disease, unspecified: Secondary | ICD-10-CM

## 2018-12-22 NOTE — Addendum Note (Signed)
Addended by: Valora Corporal on: 12/22/2018 01:24 PM   Modules accepted: Orders

## 2018-12-22 NOTE — Telephone Encounter (Signed)
Patient audibly and visibly frustrated upon having to reschedule visit at check in due to emergency   Patient declined a call today from nurse to discuss issues and declined to schedule visit with PA on Monday .    Patient apologetic but states just forget it

## 2018-12-22 NOTE — Telephone Encounter (Signed)
Spoke with patient and apologized for his appointment being changed. He stated that he was very sorry for who he spoke with and that he didn't mean to cause this trouble. Advised that I would let them know of his apologies and that Dr. Saunders Revel would like to talk with him. He was agreeable with no further questions at this time.

## 2018-12-22 NOTE — Telephone Encounter (Signed)
I spoke with Mr. Craig Avery, who reports having worsening claudication compared with last year, though he is still able to walk at least 200-300 yards before onset of bilateral calf pain.  We discussed further evaluation options including angiography, though we are both hesitant to do this on account of his chronic kidney disease.  I also brought up the possibility of empirically trying cilostazol, though Mr. Pottenger did not wish to make any changes without being seen in person.  We have agreed to have him come in for ABIs and lower extremity arterial Dopplers at his earliest convenience and to see me in the office with that same day to discuss further evaluation and treatment options.  Nelva Bush, MD [facility] Pager: [phone]

## 2018-12-22 NOTE — Telephone Encounter (Signed)
I attempted to reach Mr. Gadsden several times by phone.  Twice, a man answered but then either did not speak or the connection was lost.  I have left a message asking him to call our office so that I can discuss his cancelled visit today and make arrangements for appropriate follow-up.  Nelva Bush, MD [facility] Pager: [phone]

## 2018-12-29 ENCOUNTER — Telehealth: Payer: Self-pay | Admitting: *Deleted

## 2018-12-29 ENCOUNTER — Telehealth: Payer: Self-pay | Admitting: Internal Medicine

## 2018-12-29 NOTE — Telephone Encounter (Signed)

## 2018-12-29 NOTE — Telephone Encounter (Signed)
COVID-19 Pre-Screening Questions:  In the past 7 to 10 days have you had a cough, shortness of breath, headache, congestion, fever (100 or greater) body aches, chills, sore throat, or sudden loss of taste or sense of smell? NO  Have you been around anyone with known Covid 19? NO  Have you been around anyone who is awaiting Covid 19 test results in the past 7 to 10 days? NO  Have you been around anyone who has been exposed to Covid 19, or has mentioned symptoms of Covid 19 within the past 7 to 10 days? NO  Patient confirmed appt

## 2019-01-01 ENCOUNTER — Ambulatory Visit (INDEPENDENT_AMBULATORY_CARE_PROVIDER_SITE_OTHER): Payer: Medicare Other | Admitting: Internal Medicine

## 2019-01-01 ENCOUNTER — Other Ambulatory Visit: Payer: Self-pay

## 2019-01-01 ENCOUNTER — Encounter: Payer: Self-pay | Admitting: Internal Medicine

## 2019-01-01 ENCOUNTER — Ambulatory Visit (INDEPENDENT_AMBULATORY_CARE_PROVIDER_SITE_OTHER): Payer: Medicare Other

## 2019-01-01 VITALS — BP 166/70 | HR 79 | Ht 64.0 in | Wt 201.0 lb

## 2019-01-01 DIAGNOSIS — I739 Peripheral vascular disease, unspecified: Secondary | ICD-10-CM

## 2019-01-01 DIAGNOSIS — I10 Essential (primary) hypertension: Secondary | ICD-10-CM

## 2019-01-01 DIAGNOSIS — E785 Hyperlipidemia, unspecified: Secondary | ICD-10-CM | POA: Diagnosis not present

## 2019-01-01 NOTE — Patient Instructions (Signed)
Medication Instructions:  Your physician recommends that you continue on your current medications as directed. Please refer to the Current Medication list given to you today.  If you need a refill on your cardiac medications before your next appointment, please call your pharmacy.   Lab work: - None ordered.  If you have labs (blood work) drawn today and your tests are completely normal, you will receive your results only by: Marland Kitchen MyChart Message (if you have MyChart) OR . A paper copy in the mail If you have any lab test that is abnormal or we need to change your treatment, we will call you to review the results.  Testing/Procedures: - None ordered.   Follow-Up: At [facility], you and your health needs are our priority.  As part of our continuing mission to provide you with exceptional heart care, we have created designated Provider Care Teams.  These Care Teams include your primary Cardiologist (physician) and Advanced Practice Providers (APPs -  Physician Assistants and Nurse Practitioners) who all work together to provide you with the care you need, when you need it. You will need a follow up appointment in 6 months with Dr End for peripheral vascular follow up  Your physician recommends that you schedule a follow-up appointment in: at your convenience with Dr Rockey Situ.  .  Please call our office 2 months in advance to schedule this appointment.  You may see Ida Rogue, MD or one of the following Advanced Practice Providers on your designated Care Team:   Murray Hodgkins, NP Christell Faith, PA-C . Marrianne Mood, PA-C  Any Other Special Instructions Will Be Listed Below (If Applicable).   Walking regimen: Gradually increase your walking by walking until you have the symptoms of claudication (discomfort in your legs while walking), then rest, and then walk again up to 30 minutes a day.  You should do this 3-4 days a week.       Intermittent Claudication Intermittent  claudication is pain in one or both legs that occurs when walking or exercising and goes away when resting. Intermittent claudication is a symptom of peripheral arterial disease (PAD). This condition is commonly treated with rest, medicine, and healthy lifestyle changes. If medical management does not improve symptoms, surgery can be done to restore blood flow (revascularization) to the affected leg. What are the causes?  This condition is caused by buildup of fatty material (plaque) within the major arteries in the body (atherosclerosis). Plaque makes arteries stiff and narrow, which prevents enough blood from reaching the leg muscles. Pain occurs when you walk or exercise because your muscles need (but cannot get) more blood when you are moving and exercising. What increases the risk? The following factors may make you more likely to develop this condition:  A family history of atherosclerosis.  A personal history of stroke or heart disease.  Older age.  Being inactive (sedentary lifestyle).  Being overweight.  Smoking cigarettes.  Having another health condition such as: ? Diabetes. ? High blood pressure. ? High cholesterol. What are the signs or symptoms? Symptoms of this condition may first develop in the lower leg, and then they may spread to the thigh, hip, buttock, or the back of the lower leg (calf) over time. Symptoms may include:  Aches or pains.  Cramps.  A feeling of tightness, weakness, or heaviness.  A wound on the lower leg or foot that heals poorly or does not heal. How is this diagnosed? This condition may be diagnosed based on:  Your symptoms.  Your medical history.  Tests, such as: ? Blood tests. ? Arterial duplex ultrasound. This test uses images of blood vessels and surrounding organs to evaluate blood flow within arteries. ? Angiogram. In this procedure, dye is injected into arteries and then X-rays are taken. ? Magnetic resonance angiogram (MRA). In  this procedure, strong magnets and radio waves are used instead of X-rays to create images of blood vessels and blood flow. ? CT angiogram (CTA). In this procedure, a large X-ray machine called a CT scanner takes detailed pictures of blood vessels that have been injected with dye. ? Ankle-brachial index (ABI) test. This procedure measures blood pressure in the leg during exercise and at rest. ? Exercise test. For this test, you will walk on a treadmill while tests are done (such as the ABI test) to evaluate how this condition affects your ability to walk or exercise. How is this treated? Treatment for this condition may involve treatment for the underlying cause, such as treatment for high blood pressure, high cholesterol, or diabetes. Treatment may include:  Lifestyle changes such as: ? Starting a supervised or home-based exercise program. ? Losing weight. ? Quitting smoking.  Medicines to help restore blood flow through your legs.  Blood vessel surgery (angioplasty) to restore blood flow around the blocked vessel. This is also known as endovascular therapy (EVT). This is only done if your intermittent claudication is caused by severe peripheral artery disease, a condition in which blood flow is severely or totally restricted by the narrowing of the arteries. Follow these instructions at home: Lifestyle   Maintain a healthy weight.  Eat a diet that is low in saturated fats and calories. Consider working with a diet and nutrition specialist (dietitian) to help you make healthy food choices.  Do not use any products that contain nicotine or tobacco, such as cigarettes and e-cigarettes. If you need help quitting, ask your health care provider.  If your health care provider recommended an exercise program for you, follow it as directed. Your exercise program may involve: ? Walking 3 or more times a week. ? Walking until you have certain symptoms of intermittent claudication. ? Resting until  symptoms go away. ? Gradually increasing your walking time to about 50 minutes a day. General instructions  Work with your health care provider to manage any other health conditions you may have, including diabetes, high blood pressure, or high cholesterol.  Take over-the-counter and prescription medicines only as told by your health care provider.  Keep all follow-up visits as told by your health care provider. This is important. Contact a health care provider if:  Your pain does not go away with rest.  You have sores on your legs that do not heal or have a bad smell or pus coming from them.  Your condition gets worse or does not get better with treatment. Get help right away if:  You have chest pain.  You have difficulty breathing.  You develop arm weakness.  You have trouble speaking.  Your face begins to droop.  Your foot or leg is cold or it changes color.  Your foot or leg becomes numb. These symptoms may represent a serious problem that is an emergency. Do not wait to see if the symptoms will go away. Get medical help right away. Call your local emergency services (911 in the [place] Do not drive yourself to the hospital.  Summary  Intermittent claudication is pain in one or both legs that occurs when walking  or exercising and goes away when resting.  This condition is caused by buildup of fatty material (plaque) within the major arteries in the body (atherosclerosis). Plaque makes arteries stiff and narrow, which prevents enough blood from reaching the leg muscles.  Intermittent claudication can be treated with medicine and lifestyle changes. If medical treatment fails, surgery can be done to help return blood flow to the affected area.  Make sure you work with your health care provider to manage any other health conditions you may have, including diabetes, high blood pressure, or high cholesterol. This information is not intended to replace advice given to you by your  health care provider. Make sure you discuss any questions you have with your health care provider. Document Released: 04/30/2004 Document Revised: 07/29/2016 Document Reviewed: 07/29/2016 Elsevier Interactive Patient Education  2019 [employer].

## 2019-01-01 NOTE — Progress Notes (Signed)
Follow-up Outpatient Visit Date: 01/01/2019  Primary Care Provider: [name], MD [place] [address]  Chief Complaint: Leg pain  HPI:  Mr. Sanzo is a 78 y.o. year-old male with history of carotid artery stenosis complicated by stroke status post left carotid endarterectomy, hypertension, hyperlipidemia, and obesity, who presents for follow-up of claudication.  I last saw him in 02/2018, at which time he reported being able to walk 200-300 yards before needing to rest due to discomfort in his calves.  ABI's were normal, though TBI's were mildly decreased in both legs.  Doppler imaging showed moderate bilateral outflow disease.  We discussed angiography and CT imaging of his legs but agreed to defer both due to his CKD and stable exertional symptoms.  Today, Mr. Varnell reports that he continues to have a cramping-like pain in both calves when he walks more than a few 100 yards.  In hindsight, severity and frequency of the pain is about the same as it was last year.  Discomfort might be slightly worse on the left.  Though he occasionally needs to slow down or stop what he is doing, he reports being relatively sedentary and does not have significant lifestyle limitations on account of his leg pain.  He has not had any pain at rest nor wounds on his feet.  He has not been trying to walk on a regular basis, as we had previously discussed.  Mr. Sees otherwise feels well, denying chest pain, shortness of breath, palpitations, lightheadedness, orthopnea, and edema.  He is compliant with his medications.  He notes easy bruising, remaining on dual antiplatelet therapy.  He otherwise has not had any significant bleeding.  He does not routinely check his blood pressure at home.  --------------------------------------------------------------------------------------------------  Cardiovascular History & Procedures: Cardiovascular Problems:  Peripheral vascular  disease with claudication  Stroke with carotid artery stenosis  Risk Factors:  Peripheral vascular disease, hypertension, hyperlipidemia, male gender, obesity,and age greater than 52  Cath/PCI:  None  CV Surgery:  Left carotid endarterectomy (07/21/2012, Dr. Trula Slade)  EP Procedures and Devices:  None  Non-Invasive Evaluation(s):  BLE arterial Dopplers with ABIs (12/02/2017): 50 to 74% stenosis in the right CFA.  30 to 49% stenosis in the right SFA.  30 to 49% stenosis in the left popliteal artery.  ABIs are normal bilaterally.  TBI's are mildly abnormal bilaterally.  AAA screen (09/30/2017): Normal sized abdominal aorta. Bilateral iliac disease greater than 50%, extending to the proximal common femoral artery on the right.  Exercise MPI (05/05/2017): Low risk study without ischemia. Apical thinning versus attenuation artifact noted. LVEF 67%.  Carotid Doppler (02/28/2017): Right internal carotid artery with 1 to 39% stenosis. Patent left carotid endarterectomy site without restenosis. Right external carotid artery stenosis noted. Overall, no change since 02/2016.  Recent CV Pertinent Labs: Lab Results  Component Value Date   CHOL 151 11/10/2018   CHOL 140 09/25/2012   HDL 44.20 11/10/2018   HDL 47 09/25/2012   LDLCALC 84 11/10/2018   LDLCALC 72 09/25/2012   LDLDIRECT 74.0 07/07/2016   TRIG 116.0 11/10/2018   CHOLHDL 3 11/10/2018   INR 0.91 07/14/2012   K 4.5 11/10/2018   BUN 31 (H) 11/10/2018   CREATININE 2.09 (H) 11/10/2018    Past medical and surgical history were reviewed and updated in EPIC.  Current Meds  Medication Sig  . amLODipine (NORVASC) 10 MG tablet TAKE 1 TABLET BY MOUTH EVERY DAY  . aspirin 81 MG tablet Take 81  mg by mouth daily.  Marland Kitchen atorvastatin (LIPITOR) 20 MG tablet TAKE 1 TABLET BY MOUTH DAILY AT 6PM  . clopidogrel (PLAVIX) 75 MG tablet TAKE 1 TABLET BY MOUTH EVERY DAY  . Cyanocobalamin (VITAMIN B 12 PO) Take by mouth daily.  Marland Kitchen docusate  sodium (COLACE) 100 MG capsule Take 100 mg by mouth daily as needed for mild constipation. Reported on 07/31/2015  . lisinopril-hydrochlorothiazide (PRINZIDE,ZESTORETIC) 20-12.5 MG tablet TAKE 1 TABLET BY MOUTH 2 (TWO) TIMES DAILY.  . Multiple Vitamins-Minerals (CENTRUM SILVER ADULT 50+ PO) Take 1 tablet by mouth daily.    Allergies: Patient has no known allergies.  Social History   Tobacco Use  . Smoking status: Former Smoker    Packs/day: 1.50    Years: 33.00    Pack years: 49.50    Types: Cigarettes    Quit date: 06/18/1994    Years since quitting: 24.5  . Smokeless tobacco: Never Used  Substance Use Topics  . Alcohol use: Yes    Alcohol/week: 4.0 - 5.0 standard drinks    Types: 4 - 5 Cans of beer per week  . Drug use: No    Family History  Problem Relation Age of Onset  . CAD Brother 9       MI  . Heart disease Brother        before age 64  . Heart attack Brother   . Hypertension Mother   . Cirrhosis Father 62       EtOHic  . Aneurysm Paternal Uncle 73  . Heart disease Daughter   . Hyperlipidemia Daughter     Review of Systems: A 12-system review of systems was performed and was negative except as noted in the HPI.  --------------------------------------------------------------------------------------------------  Physical Exam: BP (!) 166/70 (BP Location: Left Arm, Patient Position: Sitting, Cuff Size: Normal)   Pulse 79   Ht 5\' 4"  (1.626 m)   Wt 201 lb (91.2 kg)   BMI 34.50 kg/m   General: NAD. HEENT: No conjunctival pallor or scleral icterus. Moist mucous membranes.  OP clear. Neck: Supple without lymphadenopathy, thyromegaly, JVD, or HJR. Lungs: Normal work of breathing. Clear to auscultation bilaterally without wheezes or crackles. Heart: Regular rate and rhythm without murmurs, rubs, or gallops. Non-displaced PMI. Abd: Bowel sounds present. Soft, NT/ND without hepatosplenomegaly Ext: No lower extremity edema.  2+ radial pulses bilaterally.   Posterior tibial and dorsalis pedis pulses are trace to 1+. Skin: Warm and dry without rash.  No wound or skin breakdown on either lower extremity.  EKG: Normal sinus rhythm with sinus arrhythmia.  Otherwise, no significant abnormality.  No significant change from prior tracing on 02/22/2018.  Lab Results  Component Value Date   WBC 9.5 11/10/2018   HGB 12.4 (L) 11/10/2018   HCT 36.4 (L) 11/10/2018   MCV 92.1 11/10/2018   PLT 242.0 11/10/2018    Lab Results  Component Value Date   NA 138 11/10/2018   K 4.5 11/10/2018   CL 102 11/10/2018   CO2 26 11/10/2018   BUN 31 (H) 11/10/2018   CREATININE 2.09 (H) 11/10/2018   GLUCOSE 97 11/10/2018   ALT 19 11/10/2018    Lab Results  Component Value Date   CHOL 151 11/10/2018   HDL 44.20 11/10/2018   LDLCALC 84 11/10/2018   LDLDIRECT 74.0 07/07/2016   TRIG 116.0 11/10/2018   CHOLHDL 3 11/10/2018    --------------------------------------------------------------------------------------------------  ASSESSMENT AND PLAN: PAD with claudication: Overall, Mr. Boggus continues to complain of exertional discomfort in both  calves, similar to what he felt last year.  Symptoms are consistent with Rutherford class II claudication.  ABIs performed today are essentially unchanged from last year other than worsening of the TBI on the left.  We discussed regimented walking, medical therapy with cilostazol, and lower extremity angiography.  Mr. Mark would like to avoid adding medications and invasive testing at this time, particularly given his chronic kidney disease which would complicate contrast use.  I encouraged him to begin a regular walking regimen.  We will have him follow-up with [place] in 6 months to reassess his symptoms.  He can also discuss this when he sees vascular surgery later this year for ongoing follow-up of his carotid disease.  Hypertension: Blood pressure suboptimally controlled today.  Mr. Jirak should continue his medications  and follow-up with Dr. Danise Mina and Dr. Rockey Situ for further adjustment.  Sodium restriction was encouraged.  Hyperlipidemia: Most recent LDL in early May was just above goal at 82.  Further escalation of atorvastatin to achieve target LDL less than 70 should be considered; I will defer this to Drs. Danise Mina and [place].  Follow-up: Return to see me in 6 months.  Schedule follow-up with Dr. Rockey Situ at earliest convenience.  Nelva Bush, MD 01/02/2019 6:47 AM

## 2019-01-02 ENCOUNTER — Encounter: Payer: Self-pay | Admitting: Internal Medicine

## 2019-01-02 DIAGNOSIS — I739 Peripheral vascular disease, unspecified: Secondary | ICD-10-CM | POA: Insufficient documentation

## 2019-01-12 NOTE — Progress Notes (Signed)
I reviewed health advisor's note, was available for consultation, and agree with documentation and plan.  

## 2019-01-26 ENCOUNTER — Telehealth: Payer: Self-pay | Admitting: Cardiovascular Disease

## 2019-01-26 NOTE — Progress Notes (Signed)
Patient ID: Craig Avery, male   DOB: Jun 08, 1941, 78 y.o.   MRN: [id number] Cardiology Office Note  Date:  01/29/2019   ID:  Craig Avery, DOB 01-May-1941, MRN [id number]  PCP:  [name], MD   Chief Complaint  Patient presents with  . other    Follow up from vasc eval. Meds reviewed verbally with patient.     HPI:  78 year-old gentleman with history of hypertension  peripheral vascular disease, Carotid endarterectomy on the left July 2014, smoking history , 20 to 30 yr recent hospitalization x2 at [facility], L sided paresthesias, Found to have stroke (acute right PCA distribution ischemic infarction involving the right occipital region and right thalamus along with severe R PCA), returned a few days later to ER hypertensive to >510 systolic, With medication management for blood pressure He presents for follow-up of his PAD, hypertension  Weight up 10 pounds over the past year, eating carbohydrates LDL 84, up 20 points over the past year, used to be 63  HBA1C 5.6  Denies any significant shortness of breath chest pain No regular exercise program Does not check his blood pressure at home  Sees vascular next week Last carotid 2018, stable  LE arterial Right: 50-74% stenosis noted in the common femoral artery. 30-49% stenosis noted in the superficial femoral artery. Left: 30-49% stenosis noted in the popliteal artery.  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 Small acute infarcts in the right hippocampus and right occipital lobe, implicating right PCA territory ischemia. No mass effect or hemorrhage.  2. 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   IMPRESSION: 1. High-grade stenosis or focal near occlusion of the right PCA distal P2 segment with attenuated more distal right PCA flow.  2. Moderate to severe focal stenosis of the left PCA P2 segment.  3. 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  4. Mild to moderate anterior circulation atherosclerosis, most pronounced in the MCA M1 segments.  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  PSH:    Past Surgical History:  Procedure Laterality Date  . CAROTID ENDARTERECTOMY Left 07-21-12   cea  . CATARACT EXTRACTION  2004   bilateral  . COLONOSCOPY WITH PROPOFOL N/A 06/24/2015   3 polyps, one with high grade dysplasia, rpt 3 mo; Craig Lame, MD  . COLONOSCOPY WITH PROPOFOL N/A 10/14/2015   hyperplastic polyp; f/u PRN; Craig Lame, MD  . ENDARTERECTOMY  07/21/2012   Left CEA; Surgeon: [name], MD  . EYE SURGERY    . MELANOMA EXCISION  12/30/2016   right side of head above ear  . PATCH ANGIOPLASTY  07/21/2012   Procedure: PATCH ANGIOPLASTY;  Surgeon: [name], MD;  Location: [facility] OR;  Service: Vascular;  Laterality: Left;  Using 1 cm x 6 cm Vascu Guard patch   . TEE WITHOUT CARDIOVERSION  05/22/2012   Procedure: TRANSESOPHAGEAL ECHOCARDIOGRAM (TEE);  Surgeon: [name], MD;  Location: [facility];  Service: Cardiovascular;  Laterality: N/A;  . WISDOM TOOTH EXTRACTION      Current Outpatient Medications  Medication Sig Dispense Refill  . amLODipine (NORVASC) 10 MG tablet TAKE 1 TABLET BY MOUTH EVERY DAY 90 tablet 1  . aspirin 81 MG tablet Take 81 mg by mouth daily.    Marland Kitchen atorvastatin (LIPITOR) 20 MG tablet TAKE 1 TABLET BY MOUTH DAILY AT 6PM 90 tablet 3  . clopidogrel (PLAVIX) 75 MG tablet TAKE 1 TABLET BY MOUTH EVERY DAY  90 tablet 3  . Cyanocobalamin (VITAMIN B 12 PO) Take by mouth daily.    Marland Kitchen docusate sodium (COLACE) 100 MG capsule Take 100 mg by mouth daily as needed for mild constipation. Reported on 07/31/2015    . lisinopril-hydrochlorothiazide (PRINZIDE,ZESTORETIC) 20-12.5 MG tablet TAKE 1 TABLET BY MOUTH 2 (TWO) TIMES DAILY. 180 tablet 3  . Multiple Vitamins-Minerals (CENTRUM SILVER ADULT 50+ PO) Take 1 tablet by mouth daily.     No current facility-administered medications for this visit.      Allergies:   Patient has no known allergies.   Social History:  The patient  reports that he quit smoking about 24 years ago. His smoking use included cigarettes. He has a 49.50 pack-year smoking history. He has never used smokeless tobacco. He reports current alcohol use of about 4.0 - 5.0 standard drinks of alcohol per week. He reports that he does not use drugs.   Family History:   family history includes Aneurysm (age of onset: 51) in his paternal uncle; CAD (age of onset: 66) in his brother; Cirrhosis (age of onset: 39) in his father; Heart attack in his brother; Heart disease in his brother and daughter; Hyperlipidemia in his daughter; Hypertension in his mother.    Review of Systems: Review of Systems  Constitutional: Negative.   Respiratory: Negative.   Cardiovascular: Negative.   Gastrointestinal: Negative.   Musculoskeletal: Negative.   Neurological: Negative.   Psychiatric/Behavioral: Negative.   All other systems reviewed and are negative.    PHYSICAL EXAM: VS:  BP (!) 160/68 (BP Location: Right Arm, Patient Position: Sitting, Cuff Size: Normal)   Pulse 67   Ht 5\' 4"  (1.626 m)   Wt 202 lb (91.6 kg)   BMI 34.67 kg/m  , BMI Body mass index is 34.67 kg/m. Repeat blood pressure 140/70 Constitutional:  oriented to person, place, and time. No distress.  HENT:  Head: Grossly normal Eyes:  no discharge. No scleral icterus.  Neck: No JVD, no carotid bruits  Cardiovascular: Regular rate and  rhythm, no murmurs appreciated Pulmonary/Chest: Clear to auscultation bilaterally, no wheezes or rails Abdominal: Soft.  no distension.  no tenderness.  Musculoskeletal: Normal range of motion Neurological:  normal muscle tone. Coordination normal. No atrophy Skin: Skin warm and dry Psychiatric: normal affect, pleasant   Recent Labs: 11/10/2018: ALT 19; BUN 31; Creatinine, Ser 2.09; Hemoglobin 12.4; Platelets 242.0; Potassium 4.5; Sodium 138; TSH 4.71    Lipid Panel Lab Results  Component Value Date   CHOL 151 11/10/2018   HDL 44.20 11/10/2018   LDLCALC 84 11/10/2018   TRIG 116.0 11/10/2018      Wt Readings from Last 3 Encounters:  01/29/19 202 lb (91.6 kg)  01/01/19 201 lb (91.2 kg)  02/22/18 192 lb 4 oz (87.2 kg)      ASSESSMENT AND PLAN:  Essential hypertension - Plan: EKG 12-Lead Initial blood pressure elevated but improved on recheck Discussed need to get his weight down which is up 10 pounds in the past year  Hyperlipidemia Previously with myalgias, not as much an issue on today's visit LDL up 20 points now above goal Suggested aggressive lifestyle modification weight loss program If no improvement in numbers would likely need to add Zetia  Carotid stenosis, bilateral Ultrasound results reviewed with him in detail. Last study 2018 He has follow-up with vascular next week  CKD (chronic kidney disease) stage 3, GFR 30-59 ml/min Stable renal function,  On lisinopril Stressed to him the importance of dietary changes and exercise  This should improve his blood pressure  Shortness of breath  severe PAD Previous stress test with no significant ischemia  No change in symptoms   Total encounter time more than 25 minutes  Greater than 50% was spent in counseling and coordination of care with the patient  Disposition:   F/U  12 months   No orders of the defined types were placed in this encounter.    Signed, Esmond Plants, M.D., Ph.D. 01/29/2019  [place], [place]

## 2019-01-26 NOTE — Telephone Encounter (Signed)

## 2019-01-29 ENCOUNTER — Ambulatory Visit (INDEPENDENT_AMBULATORY_CARE_PROVIDER_SITE_OTHER): Payer: Medicare Other | Admitting: Cardiovascular Disease

## 2019-01-29 ENCOUNTER — Other Ambulatory Visit: Payer: Self-pay

## 2019-01-29 ENCOUNTER — Encounter: Payer: Self-pay | Admitting: Cardiovascular Disease

## 2019-01-29 VITALS — BP 140/68 | HR 67 | Ht 64.0 in | Wt 202.0 lb

## 2019-01-29 DIAGNOSIS — Z87891 Personal history of nicotine dependence: Secondary | ICD-10-CM | POA: Diagnosis not present

## 2019-01-29 DIAGNOSIS — I10 Essential (primary) hypertension: Secondary | ICD-10-CM | POA: Diagnosis not present

## 2019-01-29 DIAGNOSIS — E785 Hyperlipidemia, unspecified: Secondary | ICD-10-CM

## 2019-01-29 DIAGNOSIS — I6523 Occlusion and stenosis of bilateral carotid arteries: Secondary | ICD-10-CM | POA: Diagnosis not present

## 2019-01-29 DIAGNOSIS — I739 Peripheral vascular disease, unspecified: Secondary | ICD-10-CM | POA: Diagnosis not present

## 2019-01-29 DIAGNOSIS — R0602 Shortness of breath: Secondary | ICD-10-CM | POA: Diagnosis not present

## 2019-01-29 DIAGNOSIS — Z8673 Personal history of transient ischemic attack (TIA), and cerebral infarction without residual deficits: Secondary | ICD-10-CM | POA: Diagnosis not present

## 2019-01-29 NOTE — Patient Instructions (Signed)

## 2019-01-30 ENCOUNTER — Other Ambulatory Visit: Payer: Self-pay

## 2019-01-30 DIAGNOSIS — I6523 Occlusion and stenosis of bilateral carotid arteries: Secondary | ICD-10-CM

## 2019-02-02 ENCOUNTER — Telehealth (HOSPITAL_COMMUNITY): Payer: Self-pay

## 2019-02-02 NOTE — Telephone Encounter (Signed)
The above patient or their representative was contacted and gave the following answers to these questions:         Do you have any of the following symptoms?  no  Fever                    Cough                   Shortness of breath  Do  you have any of the following other symptoms?    muscle pain         vomiting,        diarrhea        rash         weakness        red eye        abdominal pain         bruising          bruising or bleeding              joint pain           severe headache    Have you been in contact with someone who was or has been sick in the past 2 weeks?  no  Yes                 Unsure                         Unable to assess   Does the person that you were in contact with have any of the following symptoms?   Cough         shortness of breath           muscle pain         vomiting,            diarrhea            rash            weakness           fever            red eye           abdominal pain           bruising  or  bleeding                joint pain                severe headache

## 2019-02-05 ENCOUNTER — Other Ambulatory Visit: Payer: Self-pay

## 2019-02-05 ENCOUNTER — Encounter: Payer: Self-pay | Admitting: Family

## 2019-02-05 ENCOUNTER — Ambulatory Visit (INDEPENDENT_AMBULATORY_CARE_PROVIDER_SITE_OTHER): Payer: Medicare Other | Admitting: Family

## 2019-02-05 ENCOUNTER — Ambulatory Visit (HOSPITAL_COMMUNITY)
Admission: RE | Admit: 2019-02-05 | Discharge: 2019-02-05 | Disposition: A | Discharge status: Home / Self Care | Payer: Medicare Other | Source: Ambulatory Visit | Attending: Family | Admitting: Family

## 2019-02-05 VITALS — BP 168/64 | HR 58 | Temp 97.4°F | Resp 18 | Ht 63.0 in | Wt 201.0 lb

## 2019-02-05 DIAGNOSIS — I6522 Occlusion and stenosis of left carotid artery: Secondary | ICD-10-CM

## 2019-02-05 DIAGNOSIS — Z9889 Other specified postprocedural states: Secondary | ICD-10-CM

## 2019-02-05 DIAGNOSIS — I6523 Occlusion and stenosis of bilateral carotid arteries: Secondary | ICD-10-CM

## 2019-02-05 DIAGNOSIS — Z87891 Personal history of nicotine dependence: Secondary | ICD-10-CM

## 2019-02-05 NOTE — Progress Notes (Signed)
Chief Complaint: Follow up Extracranial Carotid Artery Stenosis   History of Present Illness  Craig Avery is a 78 y.o. male who is status post left carotid endarterectomy on 07/21/2012 by Dr. Trula Slade. Intraoperative findings included 90% stenosis. He returns today for follow up.   Hehas a history of right brain stroke as manifested by tingling on the left side of his body and severe hypertension in 2014, just before the left CEA. He has not had any subsequent neurological events since we started him on maximal medical therapy.   He denies any history of amaurosis fugax or aphasia.  He states his grandmother did not smoke, did not have DM, (did not have these atherosclerotic risk factors) and had both legs amputated due to poor circulation.  He reports bilateral calf burning after walking 100 yards, relieved by 3-4 minutes rest. He denies non healing wounds, states he heals fast.  ABI's requested by Dr. 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  He denies any known back issues.    He denies chest pain or dyspnea. He denies any history of feeling light headed, denies any known history of irregular cardiac rhythm.     Diabetic: No Tobacco use: former smoker, quit in 1995, started at age 10  Pt meds include: Statin : Yes ASA: Yes Other anticoagulants/antiplatelets: Plavix   Past Medical History:  Diagnosis Date  . Carotid stenosis 01/2013   bilateral, s/p L CEA, plan rpt [place] qyear  . History of chicken pox   . HLD (hyperlipidemia)   . HTN (hypertension)   . 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 Obesity   . Stroke [facility]) 05/2012   R [facility] territory, presented with L paresthesias    Social History Social History   Tobacco Use  . Smoking status: Former Smoker    Packs/day: 1.50    Years:  33.00    Pack years: 49.50    Types: Cigarettes    Quit date: 06/18/1994    Years since quitting: 24.6  . Smokeless tobacco: Never Used  Substance Use Topics  . Alcohol use: Yes    Alcohol/week: 4.0 - 5.0 standard drinks    Types: 4 - 5 Cans of beer per week  . Drug use: No    Family History Family History  Problem Relation Age of Onset  . CAD Brother 63       MI  . Heart disease Brother        before age 80  . Heart attack Brother   . Hypertension Mother   . Cirrhosis Father 65       EtOHic  . Aneurysm Paternal Uncle 6  . Heart disease Daughter   . Hyperlipidemia Daughter     Surgical History Past Surgical History:  Procedure Laterality Date  . CAROTID ENDARTERECTOMY Left 07-21-12   cea  . CATARACT EXTRACTION  2004   bilateral  . COLONOSCOPY WITH PROPOFOL N/A 06/24/2015   3 polyps, one with high grade dysplasia, rpt 3 mo; [name], MD  . COLONOSCOPY WITH PROPOFOL N/A 10/14/2015   hyperplastic polyp; f/u PRN; [name], MD  . ENDARTERECTOMY  07/21/2012   Left CEA; Surgeon: [name], MD  . EYE SURGERY    . MELANOMA EXCISION  12/30/2016   right side of head above ear  .  PATCH ANGIOPLASTY  07/21/2012   Procedure: PATCH ANGIOPLASTY;  Surgeon: [name], MD;  Location: [facility] OR;  Service: Vascular;  Laterality: Left;  Using 1 cm x 6 cm Vascu Guard patch   . TEE WITHOUT CARDIOVERSION  05/22/2012   Procedure: TRANSESOPHAGEAL ECHOCARDIOGRAM (TEE);  Surgeon: [name], MD;  Location[facility]COPY;  Service: Cardiovascular;  Laterality: N/A;  . WISDOM TOOTH EXTRACTION      No Known Allergies  Current Outpatient Medications  Medication Sig Dispense Refill  . amLODipine (NORVASC) 10 MG tablet TAKE 1 TABLET BY MOUTH EVERY DAY 90 tablet 1  . aspirin 81 MG tablet Take 81 mg by mouth daily.    Marland Kitchen atorvastatin (LIPITOR) 20 MG tablet TAKE 1 TABLET BY MOUTH DAILY AT 6PM 90 tablet 3  . clopidogrel (PLAVIX) 75 MG tablet TAKE 1 TABLET BY MOUTH EVERY DAY 90 tablet  3  . Cyanocobalamin (VITAMIN B 12 PO) Take by mouth daily.    Marland Kitchen docusate sodium (COLACE) 100 MG capsule Take 100 mg by mouth daily as needed for mild constipation. Reported on 07/31/2015    . lisinopril-hydrochlorothiazide (PRINZIDE,ZESTORETIC) 20-12.5 MG tablet TAKE 1 TABLET BY MOUTH 2 (TWO) TIMES DAILY. 180 tablet 3  . Multiple Vitamins-Minerals (CENTRUM SILVER ADULT 50+ PO) Take 1 tablet by mouth daily.     No current facility-administered medications for this visit.     Review of Systems : See HPI for pertinent positives and negatives.  Physical Examination  Vitals:   02/05/19 0927 02/05/19 0932  BP: (!) 167/63 (!) 168/64  Pulse: (!) 58 (!) 58  Resp: 18   Temp: (!) 97.4 F (36.3 C)   TempSrc: Temporal   SpO2: 98%   Weight: 201 lb (91.2 kg)   Height: 5\' 3"  (1.6 m)    Body mass index is 35.61 kg/m.  General: WDWN obese male in NAD GAIT: normal Eyes: PERRLA HENT: No gross abnormalities.  Pulmonary:  Respirations are non-labored, good air movement in all fields, CTAB, no rales,  rhonchi, or wheezing. Cardiac: regular alternating with irregular rhythm, bradycardic (not on a beta blocker), no detected murmur.  VASCULAR EXAM Carotid Bruits Right Left   Negative Negative     Abdominal aortic pulse is not palpable. Radial pulses are 2+ palpable and equal.                                                                                                                            LE Pulses Right Left       POPLITEAL  not palpable   not palpable       POSTERIOR TIBIAL  not palpable   not palpable        DORSALIS PEDIS      ANTERIOR TIBIAL 2+ palpable  not palpable     Gastrointestinal: soft, nontender, BS WNL, no r/g, no palpable masses. Musculoskeletal: no muscle atrophy/wasting. M/S 5/5 throughout, extremities without ischemic changes Skin: No rashes, no ulcers,  no cellulitis.   Neurologic:  A&O X 3; appropriate affect, sensation is normal; speech is normal, CN 2-12  intact, pain and light touch intact in extremities, motor exam as listed above. Psychiatric: Normal thought content, mood appropriate to clinical situation.    Assessment: Craig Avery is a 78 y.o. male who is status post left carotid endarterectomy on 07/21/2012. Hehas a history of right brain stroke as manifested by tingling on the left side of his body and severe hypertension in 2014, just before the left CEA; no subsequent TIA or stroke.  Fortunately he does not have DM and quit tobacco use in 1995. He takes a daily statin, ASA, and Plavix.     DATA Carotid Duplex (02-05-19): Right Carotid: Velocities in the right ICA are consistent with a 1-39% stenosis.                The ECA appears >50% stenosed. Left Carotid: Velocities in the left ICA are consistent with a 1-39% stenosis.               CEA patent. Vertebrals:  Demonstrates the "bunny sign" suggesting proximal stenosis. Subclavians: Normal flow hemodynamics were seen in bilateral subclavian arteries. No significant change compared to the exam on 02-28-17.   ABI's on 01-01-19, requested by Dr. Saunders Revel ABI Findings: +---------+------------------+-----+--------+--------+ Right    Rt Pressure (mmHg)IndexWaveformComment  +---------+------------------+-----+--------+--------+ Brachial 179                                     +---------+------------------+-----+--------+--------+ ATA      162               0.88 biphasic         +---------+------------------+-----+--------+--------+ PTA      189               1.03 biphasic         +---------+------------------+-----+--------+--------+ PERO     161               0.88 biphasic         +---------+------------------+-----+--------+--------+ Ellen Henri               0.61 Normal           +---------+------------------+-----+--------+--------+  +---------+------------------+-----+--------+-------+ Left     Lt Pressure  (mmHg)IndexWaveformComment +---------+------------------+-----+--------+-------+ Brachial 184                                    +---------+------------------+-----+--------+-------+ ATA      156               0.85 biphasic        +---------+------------------+-----+--------+-------+ PTA      173               0.94 biphasic        +---------+------------------+-----+--------+-------+ PERO     163               0.89 biphasic        +---------+------------------+-----+--------+-------+ Great Toe72                0.39 Abnormal        +---------+------------------+-----+--------+-------+  +-------+-----------+-----------+------------+------------+ ABI/TBIToday's ABIToday's TBIPrevious ABIPrevious TBI +-------+-----------+-----------+------------+------------+ Right  1.03       0.61       1.06  0.56         +-------+-----------+-----------+------------+------------+ Left   0.94       0.39       0.96        0.58         +-------+-----------+-----------+------------+------------+ Bilateral ABIs appear essentially unchanged compared to prior study on 11/2017.  Summary: Right: Resting right ankle-brachial index is within normal range. No evidence of significant right lower extremity arterial disease. The right toe-brachial index is abnormal. Left: Resting left ankle-brachial index indicates mild left lower extremity arterial disease. The left toe-brachial index is abnormal.    Plan: Follow-up in 18 months with Carotid Duplex scan.   I discussed in depth with the patient the nature of atherosclerosis, and emphasized the importance of maximal medical management including strict control of blood pressure, blood glucose, and lipid levels, obtaining regular exercise, and continued cessation of smoking.  The patient is aware that without maximal medical management the underlying atherosclerotic disease process will progress, limiting the  benefit of any interventions. The patient was given information about stroke prevention and what symptoms should prompt the patient to seek immediate medical care. Thank you for allowing [place] to participate in this patient's care.  Clemon Chambers, RN, MSN, FNP-C [facility] Office: [phone]  Clinic Physician: Oneida Alar on call  02/05/19 9:33 AM

## 2019-02-05 NOTE — Patient Instructions (Signed)

## 2019-03-16 ENCOUNTER — Other Ambulatory Visit: Payer: Self-pay | Admitting: Cardiovascular Disease

## 2019-03-26 DIAGNOSIS — D0461 Carcinoma in situ of skin of right upper limb, including shoulder: Secondary | ICD-10-CM | POA: Diagnosis not present

## 2019-03-26 DIAGNOSIS — Z85828 Personal history of other malignant neoplasm of skin: Secondary | ICD-10-CM | POA: Diagnosis not present

## 2019-03-26 DIAGNOSIS — L821 Other seborrheic keratosis: Secondary | ICD-10-CM | POA: Diagnosis not present

## 2019-03-26 DIAGNOSIS — C4441 Basal cell carcinoma of skin of scalp and neck: Secondary | ICD-10-CM | POA: Diagnosis not present

## 2019-03-26 DIAGNOSIS — X32XXXA Exposure to sunlight, initial encounter: Secondary | ICD-10-CM | POA: Diagnosis not present

## 2019-03-26 DIAGNOSIS — L570 Actinic keratosis: Secondary | ICD-10-CM | POA: Diagnosis not present

## 2019-03-26 DIAGNOSIS — Z08 Encounter for follow-up examination after completed treatment for malignant neoplasm: Secondary | ICD-10-CM | POA: Diagnosis not present

## 2019-03-26 DIAGNOSIS — D485 Neoplasm of uncertain behavior of skin: Secondary | ICD-10-CM | POA: Diagnosis not present

## 2019-04-23 ENCOUNTER — Telehealth: Payer: Self-pay

## 2019-04-23 MED ORDER — ATORVASTATIN CALCIUM 20 MG PO TABS: ORAL_TABLET | ORAL | 2 refills | Status: DC

## 2019-04-23 NOTE — Telephone Encounter (Signed)
Requested Prescriptions   Signed Prescriptions Disp Refills  . atorvastatin (LIPITOR) 20 MG tablet 90 tablet 2    Sig: TAKE 1 TABLET BY MOUTH DAILY AT 6PM    Authorizing Provider: Minna Merritts    Ordering User: Raelene Bott, Andrya Roppolo L

## 2019-05-03 DIAGNOSIS — D044 Carcinoma in situ of skin of scalp and neck: Secondary | ICD-10-CM | POA: Diagnosis not present

## 2019-05-09 DIAGNOSIS — H35371 Puckering of macula, right eye: Secondary | ICD-10-CM | POA: Diagnosis not present

## 2019-05-17 ENCOUNTER — Other Ambulatory Visit: Payer: Self-pay

## 2019-05-17 ENCOUNTER — Ambulatory Visit (INDEPENDENT_AMBULATORY_CARE_PROVIDER_SITE_OTHER): Payer: Medicare Other | Admitting: Family Medicine

## 2019-05-17 ENCOUNTER — Encounter: Payer: Self-pay | Admitting: Family Medicine

## 2019-05-17 VITALS — BP 138/68 | HR 70 | Temp 97.8°F | Ht 63.0 in | Wt 203.1 lb

## 2019-05-17 DIAGNOSIS — N1832 Chronic kidney disease, stage 3b: Secondary | ICD-10-CM

## 2019-05-17 DIAGNOSIS — D0461 Carcinoma in situ of skin of right upper limb, including shoulder: Secondary | ICD-10-CM | POA: Diagnosis not present

## 2019-05-17 DIAGNOSIS — Z23 Encounter for immunization: Secondary | ICD-10-CM | POA: Diagnosis not present

## 2019-05-17 DIAGNOSIS — I10 Essential (primary) hypertension: Secondary | ICD-10-CM

## 2019-05-17 DIAGNOSIS — I6523 Occlusion and stenosis of bilateral carotid arteries: Secondary | ICD-10-CM | POA: Diagnosis not present

## 2019-05-17 DIAGNOSIS — E785 Hyperlipidemia, unspecified: Secondary | ICD-10-CM

## 2019-05-17 LAB — RENAL FUNCTION PANEL
Albumin: 4.6 g/dL (ref 3.5–5.2)
BUN: 38 mg/dL — ABNORMAL HIGH (ref 6–23)
CO2: 26 mEq/L (ref 19–32)
Calcium: 9.5 mg/dL (ref 8.4–10.5)
Chloride: 105 mEq/L (ref 96–112)
Creatinine, Ser: 2.03 mg/dL — ABNORMAL HIGH (ref 0.40–1.50)
GFR: 31.75 mL/min — ABNORMAL LOW (ref >60.00)
Glucose, Bld: 95 mg/dL (ref 70–99)
Phosphorus: 4 mg/dL (ref 2.3–4.6)
Potassium: 4.4 mEq/L (ref 3.5–5.1)
Sodium: 140 mEq/L (ref 135–145)

## 2019-05-17 LAB — LIPID PANEL
Cholesterol: 147 mg/dL (ref 0–200)
HDL: 48.3 mg/dL (ref >39.00)
LDL Cholesterol: 81 mg/dL (ref 0–99)
NonHDL: 98.92
Total CHOL/HDL Ratio: 3
Triglycerides: 89 mg/dL (ref 0.0–149.0)
VLDL: 17.8 mg/dL (ref 0.0–40.0)

## 2019-05-17 NOTE — Progress Notes (Signed)
This visit was conducted in person.  BP 138/68 (BP Location: Right Arm, Cuff Size: Large)   Pulse 70   Temp 97.8 F (36.6 C) (Temporal)   Ht 5\' 3"  (1.6 m)   Wt 203 lb 1 oz (92.1 kg)   SpO2 96%   BMI 35.97 kg/m    CC: 6 mo f/u visit Subjective:    Patient ID: Craig Avery, male    DOB: [birth year], 78 y.o.   MRN: [id number]  HPI: Craig Avery is a 78 y.o. male presenting on 05/17/2019 for Follow-up (Here for 6 mo f/u.)   Recently had R neck excisional biopsy by dermatology.   Sees [facility] and cardiology ([facility]) for known PAD with claudication and carotid stenosis s/p L CEA 07/2012. H/o R brain stroke. Seeing Q18 months.   HLD - on atorvastatin 20mg  daily, discussing zetia if lipids remain elevated. No myalgias.   HTN - Compliant with current antihypertensive regimen of amlodipine 10mg  daliy, lisinopril hctz 20/12.5mg  daily. Does not check blood pressures at home. No low blood pressure readings or symptoms of dizziness/syncope. Denies HA, vision changes, CP/tightness, leg swelling. Some intermittent exertional dyspnea.      Relevant past medical, surgical, family and social history reviewed and updated as indicated. Interim medical history since our last visit reviewed. Allergies and medications reviewed and updated. Outpatient Medications Prior to Visit  Medication Sig Dispense Refill  . amLODipine (NORVASC) 10 MG tablet TAKE 1 TABLET BY MOUTH EVERY DAY 90 tablet 1  . aspirin 81 MG tablet Take 81 mg by mouth daily.    Marland Kitchen atorvastatin (LIPITOR) 20 MG tablet TAKE 1 TABLET BY MOUTH DAILY AT 6PM 90 tablet 2  . clopidogrel (PLAVIX) 75 MG tablet TAKE 1 TABLET BY MOUTH EVERY DAY 90 tablet 3  . Cyanocobalamin (VITAMIN B 12 PO) Take by mouth daily.    Marland Kitchen docusate sodium (COLACE) 100 MG capsule Take 100 mg by mouth daily as needed for mild constipation. Reported on 07/31/2015    . lisinopril-hydrochlorothiazide (ZESTORETIC) 20-12.5 MG tablet TAKE 1 TABLET BY MOUTH 2 (TWO) TIMES DAILY. 180  tablet 3  . Multiple Vitamins-Minerals (CENTRUM SILVER ADULT 50+ PO) Take 1 tablet by mouth daily.     No facility-administered medications prior to visit.      Per HPI unless specifically indicated in ROS section below Review of Systems Objective:    BP 138/68 (BP Location: Right Arm, Cuff Size: Large)   Pulse 70   Temp 97.8 F (36.6 C) (Temporal)   Ht 5\' 3"  (1.6 m)   Wt 203 lb 1 oz (92.1 kg)   SpO2 96%   BMI 35.97 kg/m   Wt Readings from Last 3 Encounters:  05/17/19 203 lb 1 oz (92.1 kg)  02/05/19 201 lb (91.2 kg)  01/29/19 202 lb (91.6 kg)    Physical Exam Vitals signs and nursing note reviewed.  Constitutional:      General: He is not in acute distress.    Appearance: Normal appearance. He is obese. He is not ill-appearing.  HENT:     Mouth/Throat:     Mouth: Mucous membranes are moist.     Pharynx: Oropharynx is clear. No posterior oropharyngeal erythema.  Cardiovascular:     Rate and Rhythm: Normal rate and regular rhythm.     Pulses: Normal pulses.     Heart sounds: Normal heart sounds. No murmur.  Pulmonary:     Effort: Pulmonary effort is normal. No respiratory distress.  Breath sounds: Normal breath sounds. No wheezing, rhonchi or rales.  Musculoskeletal:     Right lower leg: No edema.     Left lower leg: No edema.  Neurological:     Mental Status: He is alert.  Psychiatric:        Mood and Affect: Mood normal.        Behavior: Behavior normal.       Results for orders placed or performed in visit on 11/10/18  Microalbumin / creatinine urine ratio  Result Value Ref Range   Creatinine, Urine 87 20 - 320 mg/dL   Microalb, Ur 0.5 mg/dL   Microalb Creat Ratio 6 <30 mcg/mg creat   Lab Results  Component Value Date   CHOL 151 11/10/2018   HDL 44.20 11/10/2018   LDLCALC 84 11/10/2018   LDLDIRECT 74.0 07/07/2016   TRIG 116.0 11/10/2018   CHOLHDL 3 11/10/2018    Assessment & Plan:   Problem List Items Addressed This Visit    Hyperlipidemia LDL  goal <70    Update FLP. If LDL remains above goal, discussed increased atorvastatin dosing.       Relevant Orders   Lipid panel   HTN (hypertension) (Chronic)    Chronic, stable. I did ask him to buy BP cuff to monitor closely at home.       CKD (chronic kidney disease) stage 3, GFR 30-59 ml/min - Primary    Chronic, deteriorated. Update renal function panel.  Discussed importance of tight blood pressure control. See below.       Relevant Orders   Renal panel   Carotid stenosis, bilateral    Appreciate [facility] care.       Other Visit Diagnoses    Need for influenza vaccination       Relevant Orders   Flu Vaccine QUAD High Dose(Fluad) (Completed)       No orders of the defined types were placed in this encounter.  Orders Placed This Encounter  Procedures  . Flu Vaccine QUAD High Dose(Fluad)  . Renal panel  . Lipid panel    Patient Instructions  Flu shot today.  Labs today.  I'd like you to buy BP cuff so you can monitor blood pressures at home. Let us know if consistently >140/90 to change medicines around.  Return as needed or in 6 months for wellness visit.    Follow up plan: Return in about 6 months (around 11/14/2019) for medicare wellness visit.  [name], MD

## 2019-05-17 NOTE — Assessment & Plan Note (Signed)
Appreciate [facility] care.  

## 2019-05-17 NOTE — Assessment & Plan Note (Signed)
Update FLP. If LDL remains above goal, discussed increased atorvastatin dosing.

## 2019-05-17 NOTE — Assessment & Plan Note (Signed)
Chronic, deteriorated. Update renal function panel.  Discussed importance of tight blood pressure control. See below.

## 2019-05-17 NOTE — Assessment & Plan Note (Signed)
Chronic, stable. I did ask him to buy BP cuff to monitor closely at home.

## 2019-05-17 NOTE — Patient Instructions (Addendum)
Flu shot today.  Labs today.  I'd like you to buy BP cuff so you can monitor blood pressures at home. Let us know if consistently >140/90 to change medicines around.  Return as needed or in 6 months for wellness visit.

## 2019-05-28 DIAGNOSIS — C4441 Basal cell carcinoma of skin of scalp and neck: Secondary | ICD-10-CM | POA: Diagnosis not present

## 2019-06-12 DIAGNOSIS — H35359 Cystoid macular degeneration, unspecified eye: Secondary | ICD-10-CM | POA: Diagnosis not present

## 2019-06-12 DIAGNOSIS — H35352 Cystoid macular degeneration, left eye: Secondary | ICD-10-CM | POA: Diagnosis not present

## 2019-06-12 DIAGNOSIS — U071 COVID-19: Secondary | ICD-10-CM

## 2019-06-12 DIAGNOSIS — J1282 Pneumonia due to coronavirus disease 2019: Secondary | ICD-10-CM

## 2019-06-12 HISTORY — DX: COVID-19: U07.1

## 2019-06-12 HISTORY — DX: Pneumonia due to coronavirus disease 2019: J12.82

## 2019-07-03 ENCOUNTER — Other Ambulatory Visit: Payer: Self-pay | Admitting: Family Medicine

## 2019-07-06 ENCOUNTER — Emergency Department: Payer: Medicare Other

## 2019-07-06 ENCOUNTER — Encounter: Payer: Self-pay | Admitting: Emergency Medicine

## 2019-07-06 ENCOUNTER — Inpatient Hospital Stay
Admission: EM | Admit: 2019-07-06 | Discharge: 2019-07-12 | DRG: 177 | Disposition: A | Discharge status: Other Acute Inpatient Hospital | Payer: Medicare Other | Attending: Internal Medicine | Admitting: Internal Medicine

## 2019-07-06 ENCOUNTER — Other Ambulatory Visit: Payer: Self-pay

## 2019-07-06 DIAGNOSIS — R000 Tachycardia, unspecified: Secondary | ICD-10-CM | POA: Diagnosis present

## 2019-07-06 DIAGNOSIS — Z87891 Personal history of nicotine dependence: Secondary | ICD-10-CM

## 2019-07-06 DIAGNOSIS — Z8673 Personal history of transient ischemic attack (TIA), and cerebral infarction without residual deficits: Secondary | ICD-10-CM | POA: Diagnosis not present

## 2019-07-06 DIAGNOSIS — Z7982 Long term (current) use of aspirin: Secondary | ICD-10-CM

## 2019-07-06 DIAGNOSIS — N179 Acute kidney failure, unspecified: Secondary | ICD-10-CM | POA: Diagnosis present

## 2019-07-06 DIAGNOSIS — Z79899 Other long term (current) drug therapy: Secondary | ICD-10-CM

## 2019-07-06 DIAGNOSIS — Z9981 Dependence on supplemental oxygen: Secondary | ICD-10-CM | POA: Diagnosis not present

## 2019-07-06 DIAGNOSIS — Z85820 Personal history of malignant melanoma of skin: Secondary | ICD-10-CM | POA: Diagnosis not present

## 2019-07-06 DIAGNOSIS — F410 Panic disorder [episodic paroxysmal anxiety] without agoraphobia: Secondary | ICD-10-CM | POA: Diagnosis present

## 2019-07-06 DIAGNOSIS — Z8249 Family history of ischemic heart disease and other diseases of the circulatory system: Secondary | ICD-10-CM

## 2019-07-06 DIAGNOSIS — R0902 Hypoxemia: Secondary | ICD-10-CM | POA: Insufficient documentation

## 2019-07-06 DIAGNOSIS — J9691 Respiratory failure, unspecified with hypoxia: Secondary | ICD-10-CM

## 2019-07-06 DIAGNOSIS — I739 Peripheral vascular disease, unspecified: Secondary | ICD-10-CM | POA: Diagnosis present

## 2019-07-06 DIAGNOSIS — R451 Restlessness and agitation: Secondary | ICD-10-CM | POA: Diagnosis not present

## 2019-07-06 DIAGNOSIS — J1289 Other viral pneumonia: Secondary | ICD-10-CM | POA: Diagnosis present

## 2019-07-06 DIAGNOSIS — R443 Hallucinations, unspecified: Secondary | ICD-10-CM | POA: Diagnosis not present

## 2019-07-06 DIAGNOSIS — I10 Essential (primary) hypertension: Secondary | ICD-10-CM | POA: Diagnosis not present

## 2019-07-06 DIAGNOSIS — R441 Visual hallucinations: Secondary | ICD-10-CM | POA: Diagnosis present

## 2019-07-06 DIAGNOSIS — J9601 Acute respiratory failure with hypoxia: Secondary | ICD-10-CM | POA: Diagnosis present

## 2019-07-06 DIAGNOSIS — I129 Hypertensive chronic kidney disease with stage 1 through stage 4 chronic kidney disease, or unspecified chronic kidney disease: Secondary | ICD-10-CM | POA: Diagnosis present

## 2019-07-06 DIAGNOSIS — N183 Chronic kidney disease, stage 3 unspecified: Secondary | ICD-10-CM | POA: Diagnosis present

## 2019-07-06 DIAGNOSIS — Z6834 Body mass index (BMI) 34.0-34.9, adult: Secondary | ICD-10-CM

## 2019-07-06 DIAGNOSIS — E785 Hyperlipidemia, unspecified: Secondary | ICD-10-CM | POA: Diagnosis present

## 2019-07-06 DIAGNOSIS — Z7902 Long term (current) use of antithrombotics/antiplatelets: Secondary | ICD-10-CM | POA: Diagnosis not present

## 2019-07-06 DIAGNOSIS — E669 Obesity, unspecified: Secondary | ICD-10-CM | POA: Diagnosis present

## 2019-07-06 DIAGNOSIS — U071 COVID-19: Principal | ICD-10-CM | POA: Diagnosis present

## 2019-07-06 LAB — BLOOD GAS, ARTERIAL
Acid-Base Excess: 1.1 mmol/L (ref 0.0–2.0)
Allens test (pass/fail): POSITIVE — AB
Bicarbonate: 25.1 mmol/L (ref 20.0–28.0)
FIO2: 0.28
O2 Saturation: 96 %
Patient temperature: 37
pCO2 arterial: 37 mmHg (ref 32.0–48.0)
pH, Arterial: 7.44 (ref 7.350–7.450)
pO2, Arterial: 79 mmHg — ABNORMAL LOW (ref 83.0–108.0)

## 2019-07-06 LAB — HEPATITIS B SURFACE ANTIGEN: Hepatitis B Surface Ag: NONREACTIVE

## 2019-07-06 LAB — CBC WITH DIFFERENTIAL/PLATELET
Abs Immature Granulocytes: 0.02 10*3/uL (ref 0.00–0.07)
Basophils Absolute: 0 10*3/uL (ref 0.0–0.1)
Basophils Relative: 0 %
Eosinophils Absolute: 0 10*3/uL (ref 0.0–0.5)
Eosinophils Relative: 0 %
HCT: 35.3 % — ABNORMAL LOW (ref 39.0–52.0)
Hemoglobin: 12.2 g/dL — ABNORMAL LOW (ref 13.0–17.0)
Immature Granulocytes: 0 %
Lymphocytes Relative: 30 %
Lymphs Abs: 1.9 10*3/uL (ref 0.7–4.0)
MCH: 30.4 pg (ref 26.0–34.0)
MCHC: 34.6 g/dL (ref 30.0–36.0)
MCV: 88 fL (ref 80.0–100.0)
Monocytes Absolute: 0.3 10*3/uL (ref 0.1–1.0)
Monocytes Relative: 5 %
Neutro Abs: 4.1 10*3/uL (ref 1.7–7.7)
Neutrophils Relative %: 65 %
Platelets: 211 10*3/uL (ref 150–400)
RBC: 4.01 MIL/uL — ABNORMAL LOW (ref 4.22–5.81)
RDW: 13.3 % (ref 11.5–15.5)
WBC: 6.3 10*3/uL (ref 4.0–10.5)
nRBC: 0 % (ref 0.0–0.2)

## 2019-07-06 LAB — COMPREHENSIVE METABOLIC PANEL
ALT: 29 U/L (ref 0–44)
AST: 28 U/L (ref 15–41)
Albumin: 3.9 g/dL (ref 3.5–5.0)
Alkaline Phosphatase: 55 U/L (ref 38–126)
Anion gap: 12 (ref 5–15)
BUN: 53 mg/dL — ABNORMAL HIGH (ref 8–23)
CO2: 21 mmol/L — ABNORMAL LOW (ref 22–32)
Calcium: 8 mg/dL — ABNORMAL LOW (ref 8.9–10.3)
Chloride: 102 mmol/L (ref 98–111)
Creatinine, Ser: 2.48 mg/dL — ABNORMAL HIGH (ref 0.61–1.24)
GFR calc Af Amer: 27 mL/min — ABNORMAL LOW (ref >60)
GFR calc non Af Amer: 24 mL/min — ABNORMAL LOW (ref >60)
Glucose, Bld: 120 mg/dL — ABNORMAL HIGH (ref 70–99)
Potassium: 4.8 mmol/L (ref 3.5–5.1)
Sodium: 135 mmol/L (ref 135–145)
Total Bilirubin: 0.7 mg/dL (ref 0.3–1.2)
Total Protein: 7.7 g/dL (ref 6.5–8.1)

## 2019-07-06 LAB — CBC
HCT: 31.8 % — ABNORMAL LOW (ref 39.0–52.0)
Hemoglobin: 11.1 g/dL — ABNORMAL LOW (ref 13.0–17.0)
MCH: 30.6 pg (ref 26.0–34.0)
MCHC: 34.9 g/dL (ref 30.0–36.0)
MCV: 87.6 fL (ref 80.0–100.0)
Platelets: 175 10*3/uL (ref 150–400)
RBC: 3.63 MIL/uL — ABNORMAL LOW (ref 4.22–5.81)
RDW: 13.4 % (ref 11.5–15.5)
WBC: 7.2 10*3/uL (ref 4.0–10.5)
nRBC: 0 % (ref 0.0–0.2)

## 2019-07-06 LAB — FIBRIN DERIVATIVES D-DIMER (ARMC ONLY): Fibrin derivatives D-dimer (ARMC): 773.66 ng/mL (FEU) — ABNORMAL HIGH (ref 0.00–499.00)

## 2019-07-06 LAB — TROPONIN I (HIGH SENSITIVITY)
Troponin I (High Sensitivity): 7 ng/L (ref <18)
Troponin I (High Sensitivity): 6 ng/L (ref <18)

## 2019-07-06 LAB — C-REACTIVE PROTEIN: CRP: 10.4 mg/dL — ABNORMAL HIGH (ref <1.0)

## 2019-07-06 LAB — BRAIN NATRIURETIC PEPTIDE: B Natriuretic Peptide: 31 pg/mL (ref 0.0–100.0)

## 2019-07-06 LAB — LACTIC ACID, PLASMA: Lactic Acid, Venous: 1.5 mmol/L (ref 0.5–1.9)

## 2019-07-06 LAB — POC SARS CORONAVIRUS 2 AG: SARS Coronavirus 2 Ag: POSITIVE — AB

## 2019-07-06 LAB — LACTATE DEHYDROGENASE: LDH: 171 U/L (ref 98–192)

## 2019-07-06 LAB — FERRITIN: Ferritin: 1158 ng/mL — ABNORMAL HIGH (ref 24–336)

## 2019-07-06 LAB — FIBRINOGEN: Fibrinogen: 673 mg/dL — ABNORMAL HIGH (ref 210–475)

## 2019-07-06 LAB — PROCALCITONIN: Procalcitonin: 0.14 ng/mL

## 2019-07-06 MED ORDER — GUAIFENESIN-DM 100-10 MG/5ML PO SYRP
10.0000 mL | ORAL_SOLUTION | ORAL | Status: DC | PRN
  Administered 2019-07-07: 20:00:00 10 mL via ORAL
  Filled 2019-07-06: qty 10

## 2019-07-06 MED ORDER — SODIUM CHLORIDE 0.9 % IV BOLUS
1000.0000 mL | Freq: Once | INTRAVENOUS | Status: AC
  Administered 2019-07-06: 14:00:00 1000 mL via INTRAVENOUS

## 2019-07-06 MED ORDER — POLYETHYLENE GLYCOL 3350 17 G PO PACK
17.0000 g | PACK | Freq: Every day | ORAL | Status: DC | PRN
  Administered 2019-07-10: 17 g via ORAL
  Filled 2019-07-06: qty 1

## 2019-07-06 MED ORDER — DEXAMETHASONE SODIUM PHOSPHATE 10 MG/ML IJ SOLN
6.0000 mg | INTRAMUSCULAR | Status: DC
  Administered 2019-07-07 – 2019-07-12 (×6): 6 mg via INTRAVENOUS
  Filled 2019-07-06 (×7): qty 0.6

## 2019-07-06 MED ORDER — SODIUM CHLORIDE 0.9 % IV SOLN
200.0000 mg | Freq: Once | INTRAVENOUS | Status: AC
  Administered 2019-07-06: 200 mg via INTRAVENOUS
  Filled 2019-07-06: qty 200

## 2019-07-06 MED ORDER — SODIUM CHLORIDE 0.9% FLUSH: 3.0000 mL | Freq: Two times a day (BID) | INTRAVENOUS | Status: DC

## 2019-07-06 MED ORDER — ONDANSETRON HCL 4 MG PO TABS: 4.0000 mg | ORAL_TABLET | Freq: Four times a day (QID) | ORAL | Status: DC | PRN

## 2019-07-06 MED ORDER — HYDROCOD POLST-CPM POLST ER 10-8 MG/5ML PO SUER: 5.0000 mL | Freq: Two times a day (BID) | ORAL | Status: DC | PRN

## 2019-07-06 MED ORDER — ADULT MULTIVITAMIN W/MINERALS CH
1.0000 | ORAL_TABLET | Freq: Every day | ORAL | Status: DC
  Administered 2019-07-07 – 2019-07-12 (×6): 1 via ORAL
  Filled 2019-07-06 (×6): qty 1

## 2019-07-06 MED ORDER — ATORVASTATIN CALCIUM 20 MG PO TABS
20.0000 mg | ORAL_TABLET | Freq: Every day | ORAL | Status: DC
  Administered 2019-07-06 – 2019-07-11 (×6): 20 mg via ORAL
  Filled 2019-07-06 (×6): qty 1

## 2019-07-06 MED ORDER — CYANOCOBALAMIN 500 MCG PO TABS
500.0000 ug | ORAL_TABLET | Freq: Every day | ORAL | Status: DC
  Administered 2019-07-07 – 2019-07-12 (×6): 500 ug via ORAL
  Filled 2019-07-06 (×7): qty 1

## 2019-07-06 MED ORDER — SODIUM CHLORIDE 0.9% FLUSH: 3.0000 mL | Freq: Once | INTRAVENOUS | Status: DC

## 2019-07-06 MED ORDER — ACETAMINOPHEN 325 MG PO TABS: 650.0000 mg | ORAL_TABLET | Freq: Four times a day (QID) | ORAL | Status: DC | PRN

## 2019-07-06 MED ORDER — AMLODIPINE BESYLATE 10 MG PO TABS
10.0000 mg | ORAL_TABLET | Freq: Every day | ORAL | Status: DC
  Administered 2019-07-06 – 2019-07-12 (×7): 10 mg via ORAL
  Filled 2019-07-06 (×4): qty 1
  Filled 2019-07-06: qty 2
  Filled 2019-07-06 (×2): qty 1

## 2019-07-06 MED ORDER — ASCORBIC ACID 500 MG PO TABS
500.0000 mg | ORAL_TABLET | Freq: Every day | ORAL | Status: DC
  Administered 2019-07-06 – 2019-07-12 (×7): 500 mg via ORAL
  Filled 2019-07-06 (×7): qty 1

## 2019-07-06 MED ORDER — ONDANSETRON HCL 4 MG/2ML IJ SOLN
4.0000 mg | Freq: Four times a day (QID) | INTRAMUSCULAR | Status: DC | PRN
  Administered 2019-07-08: 4 mg via INTRAVENOUS
  Filled 2019-07-06: qty 2

## 2019-07-06 MED ORDER — SODIUM CHLORIDE 0.9 % IV SOLN: INTRAVENOUS | Status: DC

## 2019-07-06 MED ORDER — ASPIRIN EC 81 MG PO TBEC
81.0000 mg | DELAYED_RELEASE_TABLET | Freq: Every day | ORAL | Status: DC
  Administered 2019-07-06 – 2019-07-12 (×7): 81 mg via ORAL
  Filled 2019-07-06 (×7): qty 1

## 2019-07-06 MED ORDER — ACETAMINOPHEN 500 MG PO TABS
ORAL_TABLET | ORAL | Status: AC
  Filled 2019-07-06: qty 2

## 2019-07-06 MED ORDER — ZINC SULFATE 220 (50 ZN) MG PO CAPS
220.0000 mg | ORAL_CAPSULE | Freq: Every day | ORAL | Status: DC
  Administered 2019-07-06 – 2019-07-12 (×7): 220 mg via ORAL
  Filled 2019-07-06 (×7): qty 1

## 2019-07-06 MED ORDER — IPRATROPIUM-ALBUTEROL 20-100 MCG/ACT IN AERS
1.0000 | INHALATION_SPRAY | Freq: Four times a day (QID) | RESPIRATORY_TRACT | Status: DC
  Administered 2019-07-06 – 2019-07-12 (×23): 1 via RESPIRATORY_TRACT
  Filled 2019-07-06: qty 4

## 2019-07-06 MED ORDER — DEXAMETHASONE SODIUM PHOSPHATE 10 MG/ML IJ SOLN
6.0000 mg | Freq: Once | INTRAMUSCULAR | Status: AC
  Administered 2019-07-06: 14:00:00 6 mg via INTRAVENOUS
  Filled 2019-07-06: qty 1

## 2019-07-06 MED ORDER — SODIUM CHLORIDE 0.9 % IV SOLN
100.0000 mg | Freq: Every day | INTRAVENOUS | Status: AC
  Administered 2019-07-07 – 2019-07-10 (×4): 100 mg via INTRAVENOUS
  Filled 2019-07-06 (×4): qty 100

## 2019-07-06 MED ORDER — CLOPIDOGREL BISULFATE 75 MG PO TABS
75.0000 mg | ORAL_TABLET | Freq: Every day | ORAL | Status: DC
  Administered 2019-07-06 – 2019-07-12 (×7): 75 mg via ORAL
  Filled 2019-07-06 (×7): qty 1

## 2019-07-06 MED ORDER — LISINOPRIL-HYDROCHLOROTHIAZIDE 20-12.5 MG PO TABS: 1.0000 | ORAL_TABLET | Freq: Every day | ORAL | Status: DC

## 2019-07-06 MED ORDER — ACETAMINOPHEN 500 MG PO TABS
1000.0000 mg | ORAL_TABLET | Freq: Once | ORAL | Status: AC
  Administered 2019-07-06: 11:00:00 1000 mg via ORAL

## 2019-07-06 MED ORDER — ENOXAPARIN SODIUM 40 MG/0.4ML ~~LOC~~ SOLN
30.0000 mg | SUBCUTANEOUS | Status: DC
  Administered 2019-07-06: 20:00:00 30 mg via SUBCUTANEOUS
  Filled 2019-07-06: qty 0.4

## 2019-07-06 MED ORDER — DOCUSATE SODIUM 100 MG PO CAPS
100.0000 mg | ORAL_CAPSULE | Freq: Every day | ORAL | Status: DC | PRN
  Administered 2019-07-10: 100 mg via ORAL
  Filled 2019-07-06: qty 1

## 2019-07-06 MED ORDER — PREDNISOLONE ACETATE 1 % OP SUSP
1.0000 [drp] | Freq: Four times a day (QID) | OPHTHALMIC | Status: DC
  Administered 2019-07-06 – 2019-07-12 (×22): 1 [drp] via OPHTHALMIC
  Filled 2019-07-06: qty 5

## 2019-07-06 NOTE — Consult Note (Signed)
Remdesivir - Pharmacy Brief Note   O:  ALT: 29 CXR: Consistent with multifocal pneumonia SpO2: 95% on 2L [place]   A/P:  Remdesivir 200 mg IVPB once followed by 100 mg IVPB daily x 4 days.   [place] Resident 07/06/2019 2:58 PM

## 2019-07-06 NOTE — ED Notes (Signed)
ED TO INPATIENT HANDOFF REPORT  ED Nurse Name and Phone #: noah [phone]   S Name/Age/Gender [name] 78 y.o. male Room/Bed: ED25A/ED25A  Code Status   Code Status: Full Code  Home/SNF/Other Home Patient oriented to: self, place, time and situation Is this baseline? Yes   Triage Complete: Triage complete  Chief Complaint Pneumonia due to COVID-19 virus [U07.1, J12.89]  Triage Note C/O cough and sinus congestion x 2 weeks, but yesterday started to run a fever and feel worse.  Strong cough noted.  No SOB/ DOE.  NAD    Allergies No Known Allergies  Level of Care/Admitting Diagnosis ED Disposition    ED Disposition Condition [facility] Area: [place] [[id number]]  Level of Care: Med-Surg [16]  Covid Evaluation: Confirmed COVID Positive  Diagnosis: Pneumonia due to COVID-19 virus [id number]  Admitting Physician: Lorella Nimrod [id number]  Attending Physician: Lorella Nimrod [id number]  Estimated length of stay: past midnight tomorrow  Certification:: I certify this patient will need inpatient services for at least 2 midnights       B Medical/Surgery History Past Medical History:  Diagnosis Date  . Carotid stenosis 01/2013   bilateral, s/p L CEA, plan rpt [place] qyear  . History of chicken pox   . HLD (hyperlipidemia)   . HTN (hypertension)   . 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 Obesity   . Stroke [facility]) 05/2012   R PCA territory, presented with L paresthesias   Past Surgical History:  Procedure Laterality Date  . CAROTID ENDARTERECTOMY Left 07-21-12   cea  . CATARACT EXTRACTION  2004   bilateral  . COLONOSCOPY WITH PROPOFOL N/A 06/24/2015   3 polyps, one with high grade dysplasia, rpt 3 mo; [name], MD  . COLONOSCOPY WITH PROPOFOL N/A 10/14/2015   hyperplastic polyp; f/u PRN; [name], MD  . ENDARTERECTOMY  07/21/2012   Left CEA; Surgeon: [name], MD  . EYE SURGERY    . MELANOMA EXCISION  12/30/2016   right side of head above ear  . PATCH ANGIOPLASTY  07/21/2012   Procedure: PATCH ANGIOPLASTY;  Surgeon: [name], MD;  Location: [facility] OR;  Service: Vascular;  Laterality: Left;  Using 1 cm x 6 cm Vascu Guard patch   . TEE WITHOUT CARDIOVERSION  05/22/2012   Procedure: TRANSESOPHAGEAL ECHOCARDIOGRAM (TEE);  Surgeon: [name], MD;  Location[facility] ENDOSCOPY;  Service: Cardiovascular;  Laterality: N/A;  . WISDOM TOOTH EXTRACTION       A IV Location/Drains/Wounds Patient Lines/Drains/Airways Status   Active Line/Drains/Airways    Name:   Placement date:   Placement time:   Site:   Days:   Peripheral IV 07/06/19 Left Forearm   07/06/19    1334    Forearm   less than 1          Intake/Output Last 24 hours  Intake/Output Summary (Last 24 hours) at 07/06/2019 2212 Last data filed at 07/06/2019 1556 Gross per 24 hour  Intake 1000 ml  Output --  Net 1000 ml    Labs/Imaging Results for orders placed or performed during the hospital encounter of 07/06/19 (from the past 48 hour(s))  Lactic acid, plasma     Status: None   Collection Time: 07/06/19 11:33 AM  Result Value Ref Range   Lactic Acid, Venous 1.5 0.5 - 1.9 mmol/L    Comment: Performed at [employer]  [facility], [address] [place], [place] [address]  Comprehensive metabolic panel     Status: Abnormal   Collection Time: 07/06/19 11:33 AM  Result Value Ref Range   Sodium 135 135 - 145 mmol/L   Potassium 4.8 3.5 - 5.1 mmol/L   Chloride 102 98 - 111 mmol/L   CO2 21 (L) 22 - 32 mmol/L   Glucose, Bld 120 (H) 70 - 99 mg/dL   BUN 53 (H) 8 - 23 mg/dL   Creatinine, Ser 2.48 (H) 0.61 - 1.24 mg/dL   Calcium 8.0 (L) 8.9 - 10.3 mg/dL   Total Protein 7.7 6.5 - 8.1 g/dL   Albumin 3.9 3.5 - 5.0 g/dL   AST 28 15 - 41 U/L   ALT 29 0 - 44 U/L   Alkaline Phosphatase 55 38 - 126 U/L   Total Bilirubin 0.7 0.3 - 1.2 mg/dL   GFR calc non Af Amer 24 (L) >60 mL/min    GFR calc Af Amer 27 (L) >60 mL/min   Anion gap 12 5 - 15    Comment: Performed at [facility], [address] [place], [place] [address]  CBC with Differential     Status: Abnormal   Collection Time: 07/06/19 11:33 AM  Result Value Ref Range   WBC 6.3 4.0 - 10.5 K/uL   RBC 4.01 (L) 4.22 - 5.81 MIL/uL   Hemoglobin 12.2 (L) 13.0 - 17.0 g/dL   HCT 35.3 (L) 39.0 - 52.0 %   MCV 88.0 80.0 - 100.0 fL   MCH 30.4 26.0 - 34.0 pg   MCHC 34.6 30.0 - 36.0 g/dL   RDW 13.3 11.5 - 15.5 %   Platelets 211 150 - 400 K/uL   nRBC 0.0 0.0 - 0.2 %   Neutrophils Relative % 65 %   Neutro Abs 4.1 1.7 - 7.7 K/uL   Lymphocytes Relative 30 %   Lymphs Abs 1.9 0.7 - 4.0 K/uL   Monocytes Relative 5 %   Monocytes Absolute 0.3 0.1 - 1.0 K/uL   Eosinophils Relative 0 %   Eosinophils Absolute 0.0 0.0 - 0.5 K/uL   Basophils Relative 0 %   Basophils Absolute 0.0 0.0 - 0.1 K/uL   Immature Granulocytes 0 %   Abs Immature Granulocytes 0.02 0.00 - 0.07 K/uL    Comment: Performed at [facility], [address] [place], [place] [address]  Procalcitonin - Baseline     Status: None   Collection Time: 07/06/19 11:33 AM  Result Value Ref Range   Procalcitonin 0.14 ng/mL    Comment:        Interpretation: PCT (Procalcitonin) <= 0.5 ng/mL: Systemic infection (sepsis) is not likely. Local bacterial infection is possible. (NOTE)       Sepsis PCT Algorithm           Lower Respiratory Tract                                      Infection PCT Algorithm    ----------------------------     ----------------------------         PCT < 0.25 ng/mL                PCT < 0.10 ng/mL         Strongly encourage             Strongly discourage   discontinuation of antibiotics  initiation of antibiotics    ----------------------------     -----------------------------       PCT 0.25 - 0.50 ng/mL            PCT 0.10 - 0.25 ng/mL               OR       >80% decrease in PCT            Discourage initiation of                                             antibiotics      Encourage discontinuation           of antibiotics    ----------------------------     -----------------------------         PCT >= 0.50 ng/mL              PCT 0.26 - 0.50 ng/mL               AND        <80% decrease in PCT             Encourage initiation of                                             antibiotics       Encourage continuation           of antibiotics    ----------------------------     -----------------------------        PCT >= 0.50 ng/mL                  PCT > 0.50 ng/mL               AND         increase in PCT                  Strongly encourage                                      initiation of antibiotics    Strongly encourage escalation           of antibiotics                                     -----------------------------                                           PCT <= 0.25 ng/mL                                                 OR                                        >   80% decrease in PCT                                     Discontinue / Do not initiate                                             antibiotics Performed at [facility], [address] [place], [place] [address]   Brain natriuretic peptide     Status: None   Collection Time: 07/06/19 11:33 AM  Result Value Ref Range   B Natriuretic Peptide 31.0 0.0 - 100.0 pg/mL    Comment: Performed at [facility], [address] [place], [place] [address]  POC SARS Coronavirus 2 Ag     Status: Abnormal   Collection Time: 07/06/19  2:19 PM  Result Value Ref Range   SARS Coronavirus 2 Ag POSITIVE (A) NEGATIVE    Comment: (NOTE) SARS-CoV-2 antigen PRESENT. Positive results indicate the presence of viral antigens, but clinical correlation with patient history and other diagnostic information is necessary to determine patient infection status.  Positive results do not rule out bacterial infection or co-infection  with other  viruses. False positive results are rare but can occur, and confirmatory RT-PCR testing may be appropriate in some circumstances. The expected result is Negative. Fact Sheet for Patients: PodPark.tn Fact Sheet for Providers: GiftContent.is  This test is not yet approved or cleared by the [place] FDA and  has been authorized for detection and/or diagnosis of SARS-CoV-2 by FDA under an Emergency Use Authorization (EUA).  This EUA will remain in effect (meaning this test can be used) for the duration of  the COVID-19 declaration under Section 564(b)(1) of the Act, 21 U.S.C. section 360bbb-3(b)(1), unless the a uthorization is terminated or revoked sooner.   Blood gas, arterial     Status: Abnormal   Collection Time: 07/06/19  2:52 PM  Result Value Ref Range   FIO2 0.28    Delivery systems NASAL CANNULA    pH, Arterial 7.44 7.350 - 7.450   pCO2 arterial 37 32.0 - 48.0 mmHg   pO2, Arterial 79 (L) 83.0 - 108.0 mmHg   Bicarbonate 25.1 20.0 - 28.0 mmol/L   Acid-Base Excess 1.1 0.0 - 2.0 mmol/L   O2 Saturation 96.0 %   Patient temperature 37.0    Collection site RIGHT RADIAL    Sample type ARTERIAL DRAW    Allens test (pass/fail) POSITIVE (A) PASS    Comment: Performed at [facility], [address], [place], [place] [address]  ABO/Rh     Status: None   Collection Time: 07/06/19  4:03 PM  Result Value Ref Range   ABO/RH(D)      A POS Performed at [facility], [address] [place], [place] [address]   CBC     Status: Abnormal   Collection Time: 07/06/19  4:03 PM  Result Value Ref Range   WBC 7.2 4.0 - 10.5 K/uL   RBC 3.63 (L) 4.22 - 5.81 MIL/uL   Hemoglobin 11.1 (L) 13.0 - 17.0 g/dL   HCT 31.8 (L) 39.0 - 52.0 %   MCV 87.6 80.0 - 100.0 fL   MCH 30.6 26.0 - 34.0 pg   MCHC 34.9 30.0 - 36.0 g/dL   RDW 13.4 11.5 -  15.5 %   Platelets 175 150 - 400 K/uL   nRBC 0.0 0.0 - 0.2 %    Comment: Performed  at [facility], [address] [place], [place] [address]  Fibrin derivatives D-Dimer [facility] only)     Status: Abnormal   Collection Time: 07/06/19  4:03 PM  Result Value Ref Range   Fibrin derivatives D-dimer ([facility]) 773.66 (H) 0.00 - 499.00 ng/mL (FEU)    Comment: (NOTE) <> Exclusion of Venous Thromboembolism (VTE) - OUTPATIENT ONLY   (Emergency Department or [place])   0-499 ng/ml (FEU): With a low to intermediate pretest probability                      for VTE this test result excludes the diagnosis                      of VTE.   >499 ng/ml (FEU) : VTE not excluded; additional work up for VTE is                      required. <> Testing on Inpatients and Evaluation of Disseminated Intravascular   Coagulation (DIC) Reference Range:   0-499 ng/ml (FEU) Performed at [facility], [address] [place], [place] [address]   Ferritin     Status: Abnormal   Collection Time: 07/06/19  4:03 PM  Result Value Ref Range   Ferritin 1,158 (H) 24 - 336 ng/mL    Comment: Performed at [facility], [address] [place], [place] [address]  C-reactive protein     Status: Abnormal   Collection Time: 07/06/19  4:04 PM  Result Value Ref Range   CRP 10.4 (H) <1.0 mg/dL    Comment: Performed at [facility], [place] [address], [place], [place] [address]  Fibrinogen     Status: Abnormal   Collection Time: 07/06/19  4:04 PM  Result Value Ref Range   Fibrinogen 673 (H) 210 - 475 mg/dL    Comment: Performed at [facility], [address] [place], [place] [address]  Lactate dehydrogenase     Status: None   Collection Time: 07/06/19  4:04 PM  Result Value Ref Range   LDH 171 98 - 192 U/L    Comment: Performed at [facility], [address] [place], [place] [address]  Hepatitis B surface antigen     Status: None   Collection Time: 07/06/19  4:04 PM  Result Value Ref Range   Hepatitis B Surface Ag NON REACTIVE NON REACTIVE    Comment:  Performed at [facility], [place] [address], [place], [place] [address]  Troponin I (High Sensitivity)     Status: None   Collection Time: 07/06/19  4:04 PM  Result Value Ref Range   Troponin I (High Sensitivity) 7 <18 ng/L    Comment: (NOTE) Elevated high sensitivity troponin I (hsTnI) values and significant  changes across serial measurements may suggest ACS but many other  chronic and acute conditions are known to elevate hsTnI results.  Refer to the "Links" section for chest pain algorithms and additional  guidance. Performed at [facility], [address] [place], [place] [address]   Troponin I (High Sensitivity)     Status: None   Collection Time: 07/06/19  8:13 PM  Result Value Ref Range   Troponin I (High Sensitivity) 6 <18 ng/L    Comment: (NOTE) Elevated high sensitivity  troponin I (hsTnI) values and significant  changes across serial measurements may suggest ACS but many other  chronic and acute conditions are known to elevate hsTnI results.  Refer to the "Links" section for chest pain algorithms and additional  guidance. Performed at [facility], [address] [place], [place] [address]    DG Chest 2 View  Result Date: 07/06/2019 CLINICAL DATA:  Cough and fever.  Shortness of breath and fatigue. EXAM: CHEST - 2 VIEW COMPARISON:  07/21/2012 FINDINGS: Patchy areas of subtle airspace opacity are seen in the lungs bilaterally with a slight lower lung predominance. No pleural effusion. The cardiopericardial silhouette is within normal limits for size. The visualized bony structures of the thorax are intact. IMPRESSION: Patchy areas of subtle ground-glass opacity in both lungs without associated pleural effusion. Imaging features compatible with multifocal pneumonia and viral/atypical etiology is a consideration. Electronically Signed   By: Misty Stanley M.D.   On: 07/06/2019 12:07    Pending Labs Unresulted Labs (From admission, onward)    Start      Ordered   07/13/19 0500  Creatinine, serum  (enoxaparin (LOVENOX)    CrCl < 30 ml/min)  Weekly,   STAT    Comments: while on enoxaparin therapy.    07/06/19 1455   07/07/19 0500  CBC with Differential/Platelet  Daily,   STAT     07/06/19 1455   07/07/19 0500  Comprehensive metabolic panel  Daily,   STAT     07/06/19 1455   07/07/19 0500  C-reactive protein  Daily,   STAT     07/06/19 1455   07/07/19 0500  Fibrin derivatives D-Dimer ([facility] only)  Daily,   STAT     07/06/19 1455   07/07/19 0500  Ferritin  Daily,   STAT     07/06/19 1455   07/07/19 0500  Magnesium  Daily,   STAT     07/06/19 1455   07/07/19 0500  Phosphorus  Daily,   STAT     07/06/19 1455   07/06/19 1122  Urinalysis, Complete w Microscopic  ONCE - STAT,   STAT     07/06/19 1121          Vitals/Pain Today's Vitals   07/06/19 2030 07/06/19 2100 07/06/19 2107 07/06/19 2150  BP: (!) 154/62 (!) 159/72    Pulse: 61 77 80   Resp: (!) 21 20 (!) 27   Temp:      TempSrc:      SpO2: 93% 90% 92%   Weight:      Height:      PainSc:    0-No pain    Isolation Precautions Airborne and Contact precautions  Medications Medications  enoxaparin (LOVENOX) injection 30 mg (30 mg Subcutaneous Given 07/06/19 2007)  sodium chloride flush (NS) 0.9 % injection 3 mL (3 mLs Intravenous Not Given 07/06/19 2015)  0.9 %  sodium chloride infusion ( Intravenous New Bag/Given 07/06/19 2006)  remdesivir 200 mg in sodium chloride 0.9% 250 mL IVPB (0 mg Intravenous Stopped 07/06/19 1632)    Followed by  remdesivir 100 mg in sodium chloride 0.9 % 100 mL IVPB (has no administration in time range)  Ipratropium-Albuterol (COMBIVENT) respimat 1 puff (1 puff Inhalation Not Given 07/06/19 2015)  dexamethasone (DECADRON) injection 6 mg (has no administration in time range)  guaiFENesin-dextromethorphan (ROBITUSSIN DM) 100-10 MG/5ML syrup 10 mL (has no administration in time range)  chlorpheniramine-HYDROcodone (TUSSIONEX) 10-8 MG/5ML  suspension 5 mL (has no administration in time range)  ascorbic acid (VITAMIN C) tablet 500 mg (500 mg Oral Given 07/06/19 2008)  zinc sulfate capsule 220 mg (220 mg Oral Given 07/06/19 2009)  acetaminophen (TYLENOL) tablet 650 mg (has no administration in time range)  polyethylene glycol (MIRALAX / GLYCOLAX) packet 17 g (has no administration in time range)  ondansetron (ZOFRAN) tablet 4 mg (has no administration in time range)    Or  ondansetron (ZOFRAN) injection 4 mg (has no administration in time range)  amLODipine (NORVASC) tablet 10 mg (10 mg Oral Given 07/06/19 2008)  aspirin EC tablet 81 mg (81 mg Oral Given 07/06/19 2008)  atorvastatin (LIPITOR) tablet 20 mg (20 mg Oral Given 07/06/19 2008)  clopidogrel (PLAVIX) tablet 75 mg (75 mg Oral Given 07/06/19 2009)  vitamin B-12 (CYANOCOBALAMIN) tablet 500 mcg (has no administration in time range)  docusate sodium (COLACE) capsule 100 mg (has no administration in time range)  prednisoLONE acetate (PRED FORTE) 1 % ophthalmic suspension 1 drop (1 drop Both Eyes Given 07/06/19 2009)  multivitamin with minerals tablet 1 tablet (has no administration in time range)  acetaminophen (TYLENOL) tablet 1,000 mg (1,000 mg Oral Given 07/06/19 1123)  sodium chloride 0.9 % bolus 1,000 mL (0 mLs Intravenous Stopped 07/06/19 1556)  dexamethasone (DECADRON) injection 6 mg (6 mg Intravenous Given 07/06/19 1356)    Mobility walks with person assist Low fall risk   Focused Assessments Pulmonary Assessment Handoff:  Lung sounds:   O2 Device: Nasal Cannula O2 Flow Rate (L/min): 2 L/min      R Recommendations: See Admitting Provider Note  Report given to:   Additional Notes:

## 2019-07-06 NOTE — ED Notes (Signed)
O2 increased to 3L/min due to SpO2 of 90-92%

## 2019-07-06 NOTE — H&P (Signed)
History and Physical    Craig Avery [facility] [id number] DOB: January 08, 1941 DOA: 07/06/2019  PCP: [name], MD   Patient coming from: Home  I have personally briefly reviewed patient's old medical records in [place]  Chief Complaint: Generalized weakness and fever.  HPI: Craig Avery is a 78 y.o. male with medical history significant of hypertension, CKD stage III, peripheral vascular disease, CVA, trolled carotid stenosis and hyperlipidemia presented to ED with complaints of fever and generalized weakness. Patient started getting some nasal congestion and scratchy throat few days ago, he developed some nausea with no vomiting yesterday.  His appetite is decreased.  This morning he started developing fever with chills and generalized weakness which brought him to ED.  Denies any diarrhea or constipation.  No urinary symptoms.  He denies any chest pain but having exertional dyspnea. No sick contact.  ED Course: On presentation he was febrile at 101, tachycardic and tachypneic, hypoxic requiring 2 L, hemoglobin at 12.2 which is at his baseline, CMP with creatinine of 2.48 with baseline around 2, procalcitonin at 0.14, ferritin 1158, D-dimer 773, fibrinogen 673.  Chest x-ray with bilateral groundglass opacities.  Review of Systems: As per HPI otherwise 10 point review of systems negative.   Past Medical History:  Diagnosis Date  . Carotid stenosis 01/2013   bilateral, s/p L CEA, plan rpt [place] qyear  . History of chicken pox   . HLD (hyperlipidemia)   . HTN (hypertension)   . 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 Obesity   . Stroke [facility]) 05/2012   R PCA territory, presented with L paresthesias    Past Surgical History:  Procedure Laterality Date  . CAROTID ENDARTERECTOMY Left 07-21-12   cea  . CATARACT EXTRACTION  2004   bilateral  . COLONOSCOPY WITH PROPOFOL N/A 06/24/2015   3 polyps, one with  high grade dysplasia, rpt 3 mo; [name], MD  . COLONOSCOPY WITH PROPOFOL N/A 10/14/2015   hyperplastic polyp; f/u PRN; [name], MD  . ENDARTERECTOMY  07/21/2012   Left CEA; Surgeon: [name], MD  . EYE SURGERY    . MELANOMA EXCISION  12/30/2016   right side of head above ear  . PATCH ANGIOPLASTY  07/21/2012   Procedure: PATCH ANGIOPLASTY;  Surgeon: [name], MD;  Location: [facility] OR;  Service: Vascular;  Laterality: Left;  Using 1 cm x 6 cm Vascu Guard patch   . TEE WITHOUT CARDIOVERSION  05/22/2012   Procedure: TRANSESOPHAGEAL ECHOCARDIOGRAM (TEE);  Surgeon: [name], MD;  Location: [facility] ENDOSCOPY;  Service: Cardiovascular;  Laterality: N/A;  . WISDOM TOOTH EXTRACTION       reports that he quit smoking about 25 years ago. His smoking use included cigarettes. He has a 49.50 pack-year smoking history. He has never used smokeless tobacco. He reports current alcohol use of about 4.0 - 5.0 standard drinks of alcohol per week. He reports that he does not use drugs.  No Known Allergies  Family History  Problem Relation Age of Onset  . CAD Brother 90       MI  . Heart disease Brother        before age 33  . Heart attack Brother   . Hypertension Mother   . Cirrhosis Father 20       EtOHic  . Aneurysm Paternal Uncle 84  . Heart disease Daughter   . Hyperlipidemia Daughter  Prior to Admission medications   Medication Sig Start Date End Date Taking? Authorizing Provider  amLODipine (NORVASC) 10 MG tablet TAKE 1 TABLET BY MOUTH EVERY DAY Patient taking differently: Take 10 mg by mouth daily.  07/03/19  Yes [name], MD  aspirin 81 MG tablet Take 81 mg by mouth daily.   Yes [provider]  atorvastatin (LIPITOR) 20 MG tablet TAKE 1 TABLET BY MOUTH DAILY AT 6PM Patient taking differently: Take 20 mg by mouth daily at 6 PM. TAKE 1 TABLET BY MOUTH DAILY AT 6PM 04/23/19  Yes Gollan, Kathlene November, MD  clopidogrel (PLAVIX) 75 MG tablet TAKE 1 TABLET BY  MOUTH EVERY DAY Patient taking differently: Take 75 mg by mouth daily.  12/18/18  Yes [name], MD  Cyanocobalamin (VITAMIN B 12 PO) Take by mouth daily.   Yes [provider]  docusate sodium (COLACE) 100 MG capsule Take 100 mg by mouth daily as needed for mild constipation. Reported on 07/31/2015   Yes [provider]  lisinopril-hydrochlorothiazide (ZESTORETIC) 20-12.5 MG tablet TAKE 1 TABLET BY MOUTH 2 (TWO) TIMES DAILY. 03/16/19  Yes Gollan, Kathlene November, MD  Multiple Vitamins-Minerals (CENTRUM SILVER ADULT 50+ PO) Take 1 tablet by mouth daily.   Yes [provider]  prednisoLONE acetate (PRED FORTE) 1 % ophthalmic suspension Place 1 drop into both eyes 4 (four) times daily. 07/04/19  Yes [provider]    Physical Exam: Vitals:   07/06/19 1500 07/06/19 1515 07/06/19 1530 07/06/19 1545  BP: (!) 142/78  (!) 143/70   Pulse: 76 74 78 88  Resp: 19 14 20 17   Temp:      TempSrc:      SpO2: 94% 95% 96% 96%  Weight:      Height:        General: Vital signs reviewed.  Patient is well-developed and well-nourished, in no acute distress and cooperative with exam.  Head: Normocephalic and atraumatic. Eyes: EOMI, conjunctivae normal, no scleral icterus.  ENMT: Mucous membranes are moist.  Neck: Supple, trachea midline, normal ROM, no JVD, masses, thyromegaly, or carotid bruit present.  Cardiovascular: RRR, S1 normal, S2 normal, no murmurs, gallops, or rubs. Pulmonary/Chest: Scattered crackles bilaterally. Abdominal: Soft, non-tender, non-distended, BS +,  Extremities: No lower extremity edema bilaterally,  pulses symmetric and intact bilaterally. No cyanosis or clubbing. Neurological: A&O x3, Strength is normal and symmetric bilaterally, cranial nerve II-XII are grossly intact, no focal motor deficit, sensory intact to light touch bilaterally.  Skin: Warm, dry and intact. No rashes or erythema. Psychiatric: Normal mood and affect. speech and behavior is  normal. Cognition and memory are normal.   Labs on Admission: I have personally reviewed following labs and imaging studies  CBC: Recent Labs  Lab 07/06/19 1133 07/06/19 1603  WBC 6.3 7.2  NEUTROABS 4.1  --   HGB 12.2* 11.1*  HCT 35.3* 31.8*  MCV 88.0 87.6  PLT 211 0000000   Basic Metabolic Panel: Recent Labs  Lab 07/06/19 1133  NA 135  K 4.8  CL 102  CO2 21*  GLUCOSE 120*  BUN 53*  CREATININE 2.48*  CALCIUM 8.0*   GFR: Estimated Creatinine Clearance: 24.3 mL/min (A) (by C-G formula based on SCr of 2.48 mg/dL (H)). Liver Function Tests: Recent Labs  Lab 07/06/19 1133  AST 28  ALT 29  ALKPHOS 55  BILITOT 0.7  PROT 7.7  ALBUMIN 3.9   No results for input(s): LIPASE, AMYLASE in the last 168 hours. No results for input(s):  AMMONIA in the last 168 hours. Coagulation Profile: No results for input(s): INR, PROTIME in the last 168 hours. Cardiac Enzymes: No results for input(s): CKTOTAL, CKMB, CKMBINDEX, TROPONINI in the last 168 hours. BNP (last 3 results) No results for input(s): PROBNP in the last 8760 hours. HbA1C: No results for input(s): HGBA1C in the last 72 hours. CBG: No results for input(s): GLUCAP in the last 168 hours. Lipid Profile: No results for input(s): CHOL, HDL, LDLCALC, TRIG, CHOLHDL, LDLDIRECT in the last 72 hours. Thyroid Function Tests: No results for input(s): TSH, T4TOTAL, FREET4, T3FREE, THYROIDAB in the last 72 hours. Anemia Panel: Recent Labs    07/06/19 1603  FERRITIN 1,158*   Urine analysis:    Component Value Date/Time   COLORURINE YELLOW 07/14/2012 0825   APPEARANCEUR CLEAR 07/14/2012 0825   LABSPEC 1.020 07/14/2012 0825   PHURINE 7.5 07/14/2012 0825   GLUCOSEU NEGATIVE 07/14/2012 0825   HGBUR NEGATIVE 07/14/2012 0825   BILIRUBINUR NEGATIVE 07/14/2012 0825   KETONESUR NEGATIVE 07/14/2012 0825   PROTEINUR NEGATIVE 07/14/2012 0825   UROBILINOGEN 0.2 07/14/2012 0825   NITRITE NEGATIVE 07/14/2012 0825   LEUKOCYTESUR  NEGATIVE 07/14/2012 0825    Radiological Exams on Admission: DG Chest 2 View  Result Date: 07/06/2019 CLINICAL DATA:  Cough and fever.  Shortness of breath and fatigue. EXAM: CHEST - 2 VIEW COMPARISON:  07/21/2012 FINDINGS: Patchy areas of subtle airspace opacity are seen in the lungs bilaterally with a slight lower lung predominance. No pleural effusion. The cardiopericardial silhouette is within normal limits for size. The visualized bony structures of the thorax are intact. IMPRESSION: Patchy areas of subtle ground-glass opacity in both lungs without associated pleural effusion. Imaging features compatible with multifocal pneumonia and viral/atypical etiology is a consideration. Electronically Signed   By: Misty Stanley M.D.   On: 07/06/2019 12:07    EKG: Independently reviewed.  Sinus rhythm with Q waves in lead III and aVF.  Assessment/Plan Active Problems:   Pneumonia due to COVID-19 virus   Pneumonia due to COVID-19 virus.  Positive for COVID-19 with elevated inflammatory markers and radiologic evidence of bilateral infiltrate. -Start him on remdesivir and Decadron. -Monitor inflammatory markers. -Vitamin C and zinc supplement.  AKI with CKD stage III.  Creatinine at 2.48 with baseline at 2. -IV hydration. -Monitor renal function. -Avoid nephrotoxic.  Hypertension.  Pressure mildly elevated. Patient was taking amlodipine and Zestoretic. -Continue amlodipine. -Holding Zestoretic due to AKI with CKD-can be restarted if needed and creatinine at baseline.  Peripheral vascular disease.  Patient with history of extensive peripheral vascular disease and CVA. -Continue home dose of aspirin and Plavix. -Continue statin.  DVT prophylaxis: Lovenox Code Status: Full code Family Communication: No family at bedside Disposition Plan: Pending improvement. Consults called: None Admission status: Inpatient   Lorella Nimrod MD [facility] Pager [phone]  If 7PM-7AM,  please contact night-coverage [email or web address] Password [facility]  07/06/2019, 5:53 PM   This record has been created using Dragon voice recognition software. Errors have been sought and corrected,but may not always be located. Such creation errors do not reflect on the standard of care.

## 2019-07-06 NOTE — ED Triage Notes (Signed)
C/O cough and sinus congestion x 2 weeks, but yesterday started to run a fever and feel worse.  Strong cough noted.  No SOB/ DOE.  NAD

## 2019-07-06 NOTE — ED Notes (Signed)
Pt noted to desat to 88-89% on Ra, placed on 2L via [place] by this RN.

## 2019-07-06 NOTE — ED Provider Notes (Signed)
[facility] Emergency Department Provider Note  ____________________________________________   I have reviewed the triage vital signs and the nursing notes.   HISTORY  Chief Complaint flu like symptoms   History limited by: Not Limited   HPI Craig Avery is a 78 y.o. male who presents to the emergency department today because of concern for continued covid like symptoms. The patient states that he has now been sick for a couple of weeks. His main symptom is weakness. Over the past couple of days however he started having fevers. Does state that he has had decreased oral intake since the illness started. The patient denies any known covid contacts.   Records reviewed. Per medical record review patient has a history of HLD, HTN.   Past Medical History:  Diagnosis Date  . Carotid stenosis 01/2013   bilateral, s/p L CEA, plan rpt [place] qyear  . History of chicken pox   . HLD (hyperlipidemia)   . HTN (hypertension)   . 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 Obesity   . Stroke [facility]) 05/2012   R PCA territory, presented with L paresthesias    Patient Active Problem List   Diagnosis Date Noted  . Claudication in peripheral vascular disease ([place]) 01/02/2019  . PVD (peripheral vascular disease) with claudication ([place]) 11/16/2017  . Smoking history 04/27/2017  . Right shoulder pain 11/24/2015  . Personal history of colonic polyps   . Vasovagal syncope 11/20/2014  . Constipation 11/20/2014  . Medicare annual wellness visit, subsequent 03/25/2014  . Advanced care planning/counseling discussion 03/25/2014  . CKD (chronic kidney disease) stage 3, GFR 30-59 ml/min 03/25/2014  . Fatigue 12/13/2012  . Disturbance of skin sensation 08/10/2012  . Obesity, Class I, BMI 30-34.9   . TIA (transient ischemic attack) 05/21/2012  . Hyperlipidemia LDL goal <70 05/21/2012  . HTN (hypertension) 05/18/2012   . History of stroke 05/18/2012  . Carotid stenosis, bilateral 05/18/2012  . Stenosis of carotid artery 05/18/2012  . Left homonymous hemianopsia 05/12/2012    Past Surgical History:  Procedure Laterality Date  . CAROTID ENDARTERECTOMY Left 07-21-12   cea  . CATARACT EXTRACTION  2004   bilateral  . COLONOSCOPY WITH PROPOFOL N/A 06/24/2015   3 polyps, one with high grade dysplasia, rpt 3 mo; [name], MD  . COLONOSCOPY WITH PROPOFOL N/A 10/14/2015   hyperplastic polyp; f/u PRN; [name], MD  . ENDARTERECTOMY  07/21/2012   Left CEA; Surgeon: [name], MD  . EYE SURGERY    . MELANOMA EXCISION  12/30/2016   right side of head above ear  . PATCH ANGIOPLASTY  07/21/2012   Procedure: PATCH ANGIOPLASTY;  Surgeon: [name], MD;  Location: [facility] OR;  Service: Vascular;  Laterality: Left;  Using 1 cm x 6 cm Vascu Guard patch   . TEE WITHOUT CARDIOVERSION  05/22/2012   Procedure: TRANSESOPHAGEAL ECHOCARDIOGRAM (TEE);  Surgeon: [name], MD;  Location: [facility] ENDOSCOPY;  Service: Cardiovascular;  Laterality: N/A;  . WISDOM TOOTH EXTRACTION      Prior to Admission medications   Medication Sig Start Date End Date Taking? Authorizing Provider  amLODipine (NORVASC) 10 MG tablet TAKE 1 TABLET BY MOUTH EVERY DAY 07/03/19   [name], MD  aspirin 81 MG tablet Take 81 mg by mouth daily.    [provider]  atorvastatin (LIPITOR) 20 MG tablet TAKE 1 TABLET BY MOUTH DAILY AT [facility] 04/23/19  Minna Merritts, MD  clopidogrel (PLAVIX) 75 MG tablet TAKE 1 TABLET BY MOUTH EVERY DAY 12/18/18   [name], MD  Cyanocobalamin (VITAMIN B 12 PO) Take by mouth daily.    [provider]  docusate sodium (COLACE) 100 MG capsule Take 100 mg by mouth daily as needed for mild constipation. Reported on 07/31/2015    [provider]  lisinopril-hydrochlorothiazide (ZESTORETIC) 20-12.5 MG tablet TAKE 1 TABLET BY MOUTH 2 (TWO) TIMES DAILY. 03/16/19   Minna Merritts,  MD  Multiple Vitamins-Minerals (CENTRUM SILVER ADULT 50+ PO) Take 1 tablet by mouth daily.    [provider]    Allergies Patient has no known allergies.  Family History  Problem Relation Age of Onset  . CAD Brother 4       MI  . Heart disease Brother        before age 53  . Heart attack Brother   . Hypertension Mother   . Cirrhosis Father 36       EtOHic  . Aneurysm Paternal Uncle 78  . Heart disease Daughter   . Hyperlipidemia Daughter     Social History Social History   Tobacco Use  . Smoking status: Former Smoker    Packs/day: 1.50    Years: 33.00    Pack years: 49.50    Types: Cigarettes    Quit date: 06/18/1994    Years since quitting: 25.0  . Smokeless tobacco: Never Used  Substance Use Topics  . Alcohol use: Yes    Alcohol/week: 4.0 - 5.0 standard drinks    Types: 4 - 5 Cans of beer per week  . Drug use: No    Review of Systems Constitutional: Positive for fever.  Eyes: No visual changes. ENT: No sore throat. Cardiovascular: Denies chest pain. Respiratory: Denies shortness of breath. Gastrointestinal: No abdominal pain.  No nausea, no vomiting.  Positive for decreased oral intake.  Genitourinary: Negative for dysuria. Musculoskeletal: Negative for back pain. Skin: Negative for rash. Neurological: Negative for headaches, focal weakness or numbness.  ____________________________________________   PHYSICAL EXAM:  VITAL SIGNS: ED Triage Vitals  Enc Vitals Group     BP 07/06/19 1117 (!) 100/58     Pulse Rate 07/06/19 1117 (!) 101     Resp 07/06/19 1117 16     Temp 07/06/19 1117 (!) 101 F (38.3 C)     Temp Source 07/06/19 1117 Oral     SpO2 07/06/19 1117 92 %     Weight 07/06/19 1118 203 lb 0.7 oz (92.1 kg)     Height 07/06/19 1118 5\' 4"  (1.626 m)     Head Circumference --      Peak Flow --      Pain Score 07/06/19 1118 3    Constitutional: Alert and oriented.  Eyes: Conjunctivae are normal.  ENT      Head: Normocephalic and  atraumatic.      Nose: No congestion/rhinnorhea.      Mouth/Throat: Mucous membranes are moist.      Neck: No stridor. Hematological/Lymphatic/Immunilogical: No cervical lymphadenopathy. Cardiovascular: Tachycardic, regular rhythm.  No murmurs, rubs, or gallops. Respiratory: Normal respiratory effort without tachypnea nor retractions. Breath sounds are clear and equal bilaterally. No wheezes/rales/rhonchi. Gastrointestinal: Soft and non tender. No rebound. No guarding.  Genitourinary: Deferred Musculoskeletal: Normal range of motion in all extremities. No lower extremity edema. Neurologic:  Normal speech and language. No gross focal neurologic deficits are appreciated.  Skin:  Skin is warm, dry and intact.  No rash noted. Psychiatric: Mood and affect are normal. Speech and behavior are normal. Patient exhibits appropriate insight and judgment.  ____________________________________________    LABS (pertinent positives/negatives)  Lactic 1.5 CBC wbc 6.3, hgb 12.2, plt 211 CMP na 135, k 4.8, glu 120, cr 2.48  ____________________________________________   EKG  I, Nance Pear, attending physician, personally viewed and interpreted this EKG  EKG Time: 1137 Rate: 66 Rhythm: normal sinus rhythm Axis: normal Intervals: qtc 402 QRS: narrow, q waves III, aVF ST changes: no st elevation Impression: abnormal ekg  ____________________________________________    RADIOLOGY  CXR Patchy areas of ground glass opacities  ____________________________________________   PROCEDURES  Procedures  ____________________________________________   INITIAL IMPRESSION / ASSESSMENT AND PLAN / ED COURSE  Pertinent labs & imaging results that were available during my care of the patient were reviewed by me and considered in my medical decision making (see chart for details).   Patient presented to the emergency department today because of concerns for continued Covid-like symptoms.  The  patient x-ray was consistent with Covid.  He did test positive.  While here in the emergency department he did drop his oxygen saturation into the 80s.  Patient was placed on oxygen and given Decadron.  Will plan on admission.  Discussed plan with patient.  ____________________________________________   FINAL CLINICAL IMPRESSION(S) / ED DIAGNOSES  Final diagnoses:  COVID-19  Hypoxia     Note: This dictation was prepared with Dragon dictation. Any transcriptional errors that result from this process are unintentional     Nance Pear, MD 07/06/19 1441

## 2019-07-07 LAB — COMPREHENSIVE METABOLIC PANEL
ALT: 23 U/L (ref 0–44)
AST: 22 U/L (ref 15–41)
Albumin: 3.2 g/dL — ABNORMAL LOW (ref 3.5–5.0)
Alkaline Phosphatase: 46 U/L (ref 38–126)
Anion gap: 10 (ref 5–15)
BUN: 48 mg/dL — ABNORMAL HIGH (ref 8–23)
CO2: 22 mmol/L (ref 22–32)
Calcium: 7.3 mg/dL — ABNORMAL LOW (ref 8.9–10.3)
Chloride: 109 mmol/L (ref 98–111)
Creatinine, Ser: 2.01 mg/dL — ABNORMAL HIGH (ref 0.61–1.24)
GFR calc Af Amer: 35 mL/min — ABNORMAL LOW (ref >60)
GFR calc non Af Amer: 30 mL/min — ABNORMAL LOW (ref >60)
Glucose, Bld: 136 mg/dL — ABNORMAL HIGH (ref 70–99)
Potassium: 4.6 mmol/L (ref 3.5–5.1)
Sodium: 141 mmol/L (ref 135–145)
Total Bilirubin: 0.4 mg/dL (ref 0.3–1.2)
Total Protein: 6.8 g/dL (ref 6.5–8.1)

## 2019-07-07 LAB — FERRITIN: Ferritin: 1127 ng/mL — ABNORMAL HIGH (ref 24–336)

## 2019-07-07 LAB — CBC WITH DIFFERENTIAL/PLATELET
Abs Immature Granulocytes: 0.03 10*3/uL (ref 0.00–0.07)
Basophils Absolute: 0 10*3/uL (ref 0.0–0.1)
Basophils Relative: 0 %
Eosinophils Absolute: 0 10*3/uL (ref 0.0–0.5)
Eosinophils Relative: 0 %
HCT: 31.3 % — ABNORMAL LOW (ref 39.0–52.0)
Hemoglobin: 10.6 g/dL — ABNORMAL LOW (ref 13.0–17.0)
Immature Granulocytes: 0 %
Lymphocytes Relative: 24 %
Lymphs Abs: 1.8 10*3/uL (ref 0.7–4.0)
MCH: 31.3 pg (ref 26.0–34.0)
MCHC: 33.9 g/dL (ref 30.0–36.0)
MCV: 92.3 fL (ref 80.0–100.0)
Monocytes Absolute: 0.4 10*3/uL (ref 0.1–1.0)
Monocytes Relative: 5 %
Neutro Abs: 5.2 10*3/uL (ref 1.7–7.7)
Neutrophils Relative %: 71 %
Platelets: 200 10*3/uL (ref 150–400)
RBC: 3.39 MIL/uL — ABNORMAL LOW (ref 4.22–5.81)
RDW: 13.1 % (ref 11.5–15.5)
WBC: 7.4 10*3/uL (ref 4.0–10.5)
nRBC: 0 % (ref 0.0–0.2)

## 2019-07-07 LAB — MAGNESIUM: Magnesium: 2.6 mg/dL — ABNORMAL HIGH (ref 1.7–2.4)

## 2019-07-07 LAB — PHOSPHORUS: Phosphorus: 2.8 mg/dL (ref 2.5–4.6)

## 2019-07-07 LAB — FIBRIN DERIVATIVES D-DIMER (ARMC ONLY): Fibrin derivatives D-dimer (ARMC): 1126.57 ng/mL (FEU) — ABNORMAL HIGH (ref 0.00–499.00)

## 2019-07-07 LAB — C-REACTIVE PROTEIN: CRP: 12.1 mg/dL — ABNORMAL HIGH (ref <1.0)

## 2019-07-07 LAB — ABO/RH: ABO/RH(D): A POS

## 2019-07-07 MED ORDER — UNJURY CHICKEN SOUP POWDER: 2.0000 [oz_av] | Freq: Four times a day (QID) | ORAL | Status: DC

## 2019-07-07 MED ORDER — ENOXAPARIN SODIUM 40 MG/0.4ML ~~LOC~~ SOLN
40.0000 mg | SUBCUTANEOUS | Status: DC
  Administered 2019-07-07 – 2019-07-11 (×5): 40 mg via SUBCUTANEOUS
  Filled 2019-07-07 (×5): qty 0.4

## 2019-07-07 NOTE — Progress Notes (Signed)
PROGRESS NOTE    Craig Avery [facility]  [id number] DOB: 1941-07-04 DOA: 07/06/2019 PCP: [name], MD    Assessment & Plan:   Active Problems:   COVID-19    Craig Avery is a 78 y.o. male with medical history significant of hypertension, CKD stage III, peripheral vascular disease, CVA, trolled carotid stenosis and hyperlipidemia presented to ED with complaints of fever and generalized weakness.   Acute hypoxic respiratory failure 2/2 Pneumonia due to COVID-19 virus.   Positive for COVID-19 with elevated inflammatory markers and radiologic evidence of bilateral infiltrate.  Needed 2L O2. -continue remdesivir and Decadron Day 2 -Monitor inflammatory markers. -Vitamin C and zinc supplement. -wean o2 as able  AKI with CKD stage III, resolved Creatinine at 2.48 with baseline at 2.  Cr back to baseline at 2.01 today after IV hydration. -continue MIVF for another day since pt is having poor PO intake.  Hypertension.   Pressure mildly elevated. -Continue amlodipine. -Holding home Zestoretic due to AKI  Peripheral vascular disease.   Hx of CVA -Continue home dose of aspirin and Plavix. -Continue statin.   DVT prophylaxis: Lovenox SQ Code Status: Full code  Family Communication: none today Disposition Plan: to be determined    Subjective and Interval History:  Pt's main complaint was feeling weak, too weak to even get up to eat/drink.  No chest pain, abdominal pain, N/V/D.   Objective: Vitals:   07/06/19 2315 07/06/19 2335 07/07/19 0017 07/07/19 0637  BP: (!) 164/70   131/69  Pulse: 99 96  78  Resp: (!) 24  20 18   Temp: 98.6 F (37 C)   98.3 F (36.8 C)  TempSrc: Oral   Oral  SpO2: 94%   94%  Weight:      Height:        Intake/Output Summary (Last 24 hours) at 07/07/2019 1553 Last data filed at 07/07/2019 0630 Gross per 24 hour  Intake 2031.2 ml  Output --  Net 2031.2 ml   Filed Weights   07/06/19 1118  Weight: 92.1 kg    Examination:    Constitutional: NAD, AAOx3 HEENT: conjunctivae and lids normal, EOMI CV: RRR no M,R,G. Distal pulses +2.  No cyanosis.   RESP: CTA B/L over anterior, normal respiratory effort, on 2L GI: +BS, NTND Extremities: No effusions, edema, or tenderness in BLE SKIN: warm, dry and intact Neuro: II - XII grossly intact.  Sensation intact Psych: Normal mood and affect.  Appropriate judgement and reason   Data Reviewed: I have personally reviewed following labs and imaging studies  CBC: Recent Labs  Lab 07/06/19 1133 07/06/19 1603 07/07/19 0509  WBC 6.3 7.2 7.4  NEUTROABS 4.1  --  5.2  HGB 12.2* 11.1* 10.6*  HCT 35.3* 31.8* 31.3*  MCV 88.0 87.6 92.3  PLT 211 175 [id number]   Basic Metabolic Panel: Recent Labs  Lab 07/06/19 1133 07/07/19 0509  NA 135 141  K 4.8 4.6  CL 102 109  CO2 21* 22  GLUCOSE 120* 136*  BUN 53* 48*  CREATININE 2.48* 2.01*  CALCIUM 8.0* 7.3*  MG  --  2.6*  PHOS  --  2.8   GFR: Estimated Creatinine Clearance: 30 mL/min (A) (by C-G formula based on SCr of 2.01 mg/dL (H)). Liver Function Tests: Recent Labs  Lab 07/06/19 1133 07/07/19 0509  AST 28 22  ALT 29 23  ALKPHOS 55 46  BILITOT 0.7 0.4  PROT 7.7 6.8  ALBUMIN 3.9 3.2*   No results for input(s): LIPASE,  AMYLASE in the last 168 hours. No results for input(s): AMMONIA in the last 168 hours. Coagulation Profile: No results for input(s): INR, PROTIME in the last 168 hours. Cardiac Enzymes: No results for input(s): CKTOTAL, CKMB, CKMBINDEX, TROPONINI in the last 168 hours. BNP (last 3 results) No results for input(s): PROBNP in the last 8760 hours. HbA1C: No results for input(s): HGBA1C in the last 72 hours. CBG: No results for input(s): GLUCAP in the last 168 hours. Lipid Profile: No results for input(s): CHOL, HDL, LDLCALC, TRIG, CHOLHDL, LDLDIRECT in the last 72 hours. Thyroid Function Tests: No results for input(s): TSH, T4TOTAL, FREET4, T3FREE, THYROIDAB in the last 72 hours. Anemia  Panel: Recent Labs    07/06/19 1603 07/07/19 0509  FERRITIN 1,158* 1,127*   Sepsis Labs: Recent Labs  Lab 07/06/19 1133  PROCALCITON 0.14  LATICACIDVEN 1.5    No results found for this or any previous visit (from the past 240 hour(s)).    Radiology Studies: DG Chest 2 View  Result Date: 07/06/2019 CLINICAL DATA:  Cough and fever.  Shortness of breath and fatigue. EXAM: CHEST - 2 VIEW COMPARISON:  07/21/2012 FINDINGS: Patchy areas of subtle airspace opacity are seen in the lungs bilaterally with a slight lower lung predominance. No pleural effusion. The cardiopericardial silhouette is within normal limits for size. The visualized bony structures of the thorax are intact. IMPRESSION: Patchy areas of subtle ground-glass opacity in both lungs without associated pleural effusion. Imaging features compatible with multifocal pneumonia and viral/atypical etiology is a consideration. Electronically Signed   By: Misty Stanley M.D.   On: 07/06/2019 12:07     Scheduled Meds: . amLODipine  10 mg Oral Daily  . vitamin C  500 mg Oral Daily  . aspirin EC  81 mg Oral Daily  . atorvastatin  20 mg Oral Q2000  . clopidogrel  75 mg Oral Daily  . dexamethasone (DECADRON) injection  6 mg Intravenous Q24H  . enoxaparin (LOVENOX) injection  40 mg Subcutaneous Q24H  . Ipratropium-Albuterol  1 puff Inhalation Q6H  . multivitamin with minerals  1 tablet Oral Daily  . prednisoLONE acetate  1 drop Both Eyes QID  . protein supplement  2 oz Oral QID  . vitamin B-12  500 mcg Oral Daily  . zinc sulfate  220 mg Oral Daily   Continuous Infusions: . sodium chloride 100 mL/hr at 07/07/19 0631  . remdesivir 100 mg in NS 100 mL 100 mg (07/07/19 1101)     LOS: 1 day     Enzo Bi, MD [facility] If 7PM-7AM, please contact night-coverage 07/07/2019, 3:53 PM

## 2019-07-07 NOTE — Progress Notes (Signed)
   07/07/19 1900  Clinical Encounter Type  Visited With Patient  Visit Type Initial  Referral From Nurse  Stress Factors  Patient Stress Factors Health changes  Ch received an OR for prayer request. Ch called the pt room since the pt is in isolation. Pt appreciated the call but was not very conversant. Ch offered him well wishes and checked on how he was doing. No follow up needed as of now.

## 2019-07-08 DIAGNOSIS — R441 Visual hallucinations: Secondary | ICD-10-CM

## 2019-07-08 DIAGNOSIS — F410 Panic disorder [episodic paroxysmal anxiety] without agoraphobia: Secondary | ICD-10-CM

## 2019-07-08 LAB — COMPREHENSIVE METABOLIC PANEL
ALT: 24 U/L (ref 0–44)
AST: 25 U/L (ref 15–41)
Albumin: 3.5 g/dL (ref 3.5–5.0)
Alkaline Phosphatase: 52 U/L (ref 38–126)
Anion gap: 11 (ref 5–15)
BUN: 42 mg/dL — ABNORMAL HIGH (ref 8–23)
CO2: 20 mmol/L — ABNORMAL LOW (ref 22–32)
Calcium: 7.7 mg/dL — ABNORMAL LOW (ref 8.9–10.3)
Chloride: 111 mmol/L (ref 98–111)
Creatinine, Ser: 1.56 mg/dL — ABNORMAL HIGH (ref 0.61–1.24)
GFR calc Af Amer: 48 mL/min — ABNORMAL LOW (ref >60)
GFR calc non Af Amer: 41 mL/min — ABNORMAL LOW (ref >60)
Glucose, Bld: 151 mg/dL — ABNORMAL HIGH (ref 70–99)
Potassium: 4.4 mmol/L (ref 3.5–5.1)
Sodium: 142 mmol/L (ref 135–145)
Total Bilirubin: 0.7 mg/dL (ref 0.3–1.2)
Total Protein: 7.6 g/dL (ref 6.5–8.1)

## 2019-07-08 LAB — MAGNESIUM: Magnesium: 2.6 mg/dL — ABNORMAL HIGH (ref 1.7–2.4)

## 2019-07-08 LAB — CBC WITH DIFFERENTIAL/PLATELET
Abs Immature Granulocytes: 0.09 10*3/uL — ABNORMAL HIGH (ref 0.00–0.07)
Basophils Absolute: 0 10*3/uL (ref 0.0–0.1)
Basophils Relative: 0 %
Eosinophils Absolute: 0 10*3/uL (ref 0.0–0.5)
Eosinophils Relative: 0 %
HCT: 34.8 % — ABNORMAL LOW (ref 39.0–52.0)
Hemoglobin: 11.7 g/dL — ABNORMAL LOW (ref 13.0–17.0)
Immature Granulocytes: 1 %
Lymphocytes Relative: 18 %
Lymphs Abs: 1.8 10*3/uL (ref 0.7–4.0)
MCH: 31 pg (ref 26.0–34.0)
MCHC: 33.6 g/dL (ref 30.0–36.0)
MCV: 92.1 fL (ref 80.0–100.0)
Monocytes Absolute: 0.4 10*3/uL (ref 0.1–1.0)
Monocytes Relative: 4 %
Neutro Abs: 7.7 10*3/uL (ref 1.7–7.7)
Neutrophils Relative %: 77 %
Platelets: 267 10*3/uL (ref 150–400)
RBC: 3.78 MIL/uL — ABNORMAL LOW (ref 4.22–5.81)
RDW: 12.9 % (ref 11.5–15.5)
WBC: 10 10*3/uL (ref 4.0–10.5)
nRBC: 0 % (ref 0.0–0.2)

## 2019-07-08 LAB — FIBRIN DERIVATIVES D-DIMER (ARMC ONLY): Fibrin derivatives D-dimer (ARMC): 796.12 ng/mL (FEU) — ABNORMAL HIGH (ref 0.00–499.00)

## 2019-07-08 LAB — FERRITIN: Ferritin: 1728 ng/mL — ABNORMAL HIGH (ref 24–336)

## 2019-07-08 LAB — PHOSPHORUS: Phosphorus: 1.9 mg/dL — ABNORMAL LOW (ref 2.5–4.6)

## 2019-07-08 LAB — C-REACTIVE PROTEIN: CRP: 7.5 mg/dL — ABNORMAL HIGH (ref <1.0)

## 2019-07-08 MED ORDER — LORAZEPAM 2 MG/ML IJ SOLN
1.0000 mg | Freq: Once | INTRAMUSCULAR | Status: AC
  Administered 2019-07-08: 1 mg via INTRAVENOUS
  Filled 2019-07-08: qty 1

## 2019-07-08 MED ORDER — PROCHLORPERAZINE EDISYLATE 10 MG/2ML IJ SOLN
10.0000 mg | Freq: Once | INTRAMUSCULAR | Status: AC
  Administered 2019-07-08: 10 mg via INTRAVENOUS
  Filled 2019-07-08: qty 2

## 2019-07-08 MED ORDER — POTASSIUM & SODIUM PHOSPHATES 280-160-250 MG PO PACK
1.0000 | PACK | Freq: Three times a day (TID) | ORAL | Status: AC
  Administered 2019-07-08 (×3): 1 via ORAL
  Filled 2019-07-08 (×6): qty 1

## 2019-07-08 MED ORDER — DIPHENHYDRAMINE HCL 50 MG/ML IJ SOLN
25.0000 mg | Freq: Once | INTRAMUSCULAR | Status: AC
  Administered 2019-07-08: 25 mg via INTRAVENOUS
  Filled 2019-07-08: qty 1

## 2019-07-08 MED ORDER — HALOPERIDOL LACTATE 5 MG/ML IJ SOLN
5.0000 mg | Freq: Once | INTRAMUSCULAR | Status: AC
  Administered 2019-07-08: 5 mg via INTRAVENOUS
  Filled 2019-07-08: qty 1

## 2019-07-08 NOTE — Progress Notes (Signed)
Pt c/o of haldol not working and makes it worse. Had two panic attacks since haldol was given. MD was notified and ordered ativan to give but pt. refused at this time.

## 2019-07-08 NOTE — Progress Notes (Signed)
Dr. Billie Ruddy was made aware of pt's complaints of seeing people around him when his eyes closes. Family made aware as well. MD ordered haldol to give to see if this helps. MD will call   daughter with pt's permission at the end of the day.

## 2019-07-08 NOTE — Progress Notes (Signed)
PROGRESS NOTE    Craig Avery [facility]  [id number] DOB: [birth year] DOA: 07/06/2019 PCP: [name], MD    Assessment & Plan:   Active Problems:   COVID-19    Craig Avery is a 78 y.o. male with medical history significant of hypertension, CKD stage III, peripheral vascular disease, CVA, trolled carotid stenosis and hyperlipidemia presented to ED with complaints of fever and generalized weakness.   Acute hypoxic respiratory failure 2/2 Pneumonia due to COVID-19 virus.   Positive for COVID-19 with elevated inflammatory markers and radiologic evidence of bilateral infiltrate.  Needed 2-3L O2. PLAN: -continue remdesivir and Decadron Day 3 -Monitor inflammatory markers. -Vitamin C and zinc supplement. -wean o2 as able  AKI with CKD stage III, resolved Creatinine at 2.48 with baseline at 2.  Cr back to baseline at 2.01 today after IV hydration. -continue MIVF at reduced rate 50 ml/hr since pt is having poor PO intake.  Hypertension.   Pressure mildly elevated. -Continue amlodipine. -Holding home Zestoretic due to AKI  Peripheral vascular disease.   Hx of CVA -Continue home dose of aspirin and Plavix. -Continue statin.  Visual hallucination Anxiety and panic attack --Visual hallucination only pt closes his eyes, never happened before.  Pt is very anxious about being in the hospital, per daughter. --IV haldol 5 mg x1 reportedly made him feel worse PLAN: --IV ativan 1 mg  --daughters to call and reassure him --Consulted psych and still waiting to hear back --tele sitter   DVT prophylaxis: Lovenox SQ Code Status: Full code  Family Communication: updated daughter today Disposition Plan: to be determined    Subjective and Interval History:  Pt complained of having visual hallucination only when he closes his eyes, since last night.  He said he didn't sleep all night.  No fever, dyspnea, chest pain, abdominal pain, N/V/D.  Ate very little.  Throughout the  day, nursing had noted pt to be very anxious, Haldol 5 mg x1 tried but reportedly made him feel worse.    Consulted psych, paged and secure chat message send, but didn't hear back.   Objective: Vitals:   07/07/19 0637 07/07/19 2028 07/08/19 0405 07/08/19 1447  BP: 131/69 (!) 159/64 (!) 149/69 (!) 172/75  Pulse: 78 86 80 98  Resp: 18  18 (!) 24  Temp: 98.3 F (36.8 C) 98.1 F (36.7 C) 98.1 F (36.7 C) 97.8 F (36.6 C)  TempSrc: Oral Oral Oral Oral  SpO2: 94% 94% 94% 92%  Weight:      Height:        Intake/Output Summary (Last 24 hours) at 07/08/2019 1702 Last data filed at 07/08/2019 1508 Gross per 24 hour  Intake 1499.99 ml  Output 1 ml  Net 1498.99 ml   Filed Weights   07/06/19 1118  Weight: 92.1 kg    Examination:   Constitutional: NAD, AAOx3 HEENT: conjunctivae and lids normal, EOMI CV: RRR no M,R,G. Distal pulses +2.  No cyanosis.   RESP: CTA B/L over anterior, normal respiratory effort, on 2L GI: +BS, NTND Extremities: No effusions, edema, or tenderness in BLE SKIN: warm, dry and intact Neuro: II - XII grossly intact.  Sensation intact Psych: Normal mood and affect.  Appropriate judgement and reason   Data Reviewed: I have personally reviewed following labs and imaging studies  CBC: Recent Labs  Lab 07/06/19 1133 07/06/19 1603 07/07/19 0509 07/08/19 0554  WBC 6.3 7.2 7.4 10.0  NEUTROABS 4.1  --  5.2 7.7  HGB 12.2* 11.1* 10.6* 11.7*  HCT 35.3* 31.8* 31.3* 34.8*  MCV 88.0 87.6 92.3 92.1  PLT 211 175 200 [id number]   Basic Metabolic Panel: Recent Labs  Lab 07/06/19 1133 07/07/19 0509 07/08/19 0554  NA 135 141 142  K 4.8 4.6 4.4  CL 102 109 111  CO2 21* 22 20*  GLUCOSE 120* 136* 151*  BUN 53* 48* 42*  CREATININE 2.48* 2.01* 1.56*  CALCIUM 8.0* 7.3* 7.7*  MG  --  2.6* 2.6*  PHOS  --  2.8 1.9*   GFR: Estimated Creatinine Clearance: 38.7 mL/min (A) (by C-G formula based on SCr of 1.56 mg/dL (H)). Liver Function Tests: Recent Labs  Lab  07/06/19 1133 07/07/19 0509 07/08/19 0554  AST 28 22 25   ALT 29 23 24   ALKPHOS 55 46 52  BILITOT 0.7 0.4 0.7  PROT 7.7 6.8 7.6  ALBUMIN 3.9 3.2* 3.5   No results for input(s): LIPASE, AMYLASE in the last 168 hours. No results for input(s): AMMONIA in the last 168 hours. Coagulation Profile: No results for input(s): INR, PROTIME in the last 168 hours. Cardiac Enzymes: No results for input(s): CKTOTAL, CKMB, CKMBINDEX, TROPONINI in the last 168 hours. BNP (last 3 results) No results for input(s): PROBNP in the last 8760 hours. HbA1C: No results for input(s): HGBA1C in the last 72 hours. CBG: No results for input(s): GLUCAP in the last 168 hours. Lipid Profile: No results for input(s): CHOL, HDL, LDLCALC, TRIG, CHOLHDL, LDLDIRECT in the last 72 hours. Thyroid Function Tests: No results for input(s): TSH, T4TOTAL, FREET4, T3FREE, THYROIDAB in the last 72 hours. Anemia Panel: Recent Labs    07/07/19 0509 07/08/19 0554  FERRITIN 1,127* 1,728*   Sepsis Labs: Recent Labs  Lab 07/06/19 1133  PROCALCITON 0.14  LATICACIDVEN 1.5    No results found for this or any previous visit (from the past 240 hour(s)).    Radiology Studies: No results found.   Scheduled Meds: . amLODipine  10 mg Oral Daily  . vitamin C  500 mg Oral Daily  . aspirin EC  81 mg Oral Daily  . atorvastatin  20 mg Oral Q2000  . clopidogrel  75 mg Oral Daily  . dexamethasone (DECADRON) injection  6 mg Intravenous Q24H  . enoxaparin (LOVENOX) injection  40 mg Subcutaneous Q24H  . Ipratropium-Albuterol  1 puff Inhalation Q6H  . LORazepam  1 mg Intravenous Once  . multivitamin with minerals  1 tablet Oral Daily  . potassium & sodium phosphates  1 packet Oral TID WC  . prednisoLONE acetate  1 drop Both Eyes QID  . vitamin B-12  500 mcg Oral Daily  . zinc sulfate  220 mg Oral Daily   Continuous Infusions: . sodium chloride 100 mL/hr at 07/07/19 0631  . remdesivir 100 mg in NS 100 mL 100 mg (07/08/19  0951)     LOS: 2 days     Enzo Bi, MD [facility] If 7PM-7AM, please contact night-coverage 07/08/2019, 5:02 PM

## 2019-07-08 NOTE — Progress Notes (Signed)
Patient c/o seeing people moving around without any sound when he closes his eyes. Paged Hospitalist Ouma - she stated it's related to his eyes (either macular degeneration or residual effects from his past CVA) and not his brain/ to follow up with Dr. Doneen Poisson (his eye doctor) as an outpatient. Also, to blink several times when it happens to help dissipate the image.

## 2019-07-09 ENCOUNTER — Inpatient Hospital Stay: Payer: Medicare Other

## 2019-07-09 DIAGNOSIS — R451 Restlessness and agitation: Secondary | ICD-10-CM

## 2019-07-09 LAB — COMPREHENSIVE METABOLIC PANEL
ALT: 22 U/L (ref 0–44)
AST: 31 U/L (ref 15–41)
Albumin: 3.2 g/dL — ABNORMAL LOW (ref 3.5–5.0)
Alkaline Phosphatase: 45 U/L (ref 38–126)
Anion gap: 11 (ref 5–15)
BUN: 40 mg/dL — ABNORMAL HIGH (ref 8–23)
CO2: 20 mmol/L — ABNORMAL LOW (ref 22–32)
Calcium: 7.3 mg/dL — ABNORMAL LOW (ref 8.9–10.3)
Chloride: 111 mmol/L (ref 98–111)
Creatinine, Ser: 1.53 mg/dL — ABNORMAL HIGH (ref 0.61–1.24)
GFR calc Af Amer: 49 mL/min — ABNORMAL LOW (ref >60)
GFR calc non Af Amer: 42 mL/min — ABNORMAL LOW (ref >60)
Glucose, Bld: 129 mg/dL — ABNORMAL HIGH (ref 70–99)
Potassium: 4.2 mmol/L (ref 3.5–5.1)
Sodium: 142 mmol/L (ref 135–145)
Total Bilirubin: 0.6 mg/dL (ref 0.3–1.2)
Total Protein: 6.6 g/dL (ref 6.5–8.1)

## 2019-07-09 LAB — CBC WITH DIFFERENTIAL/PLATELET
Abs Immature Granulocytes: 0.22 10*3/uL — ABNORMAL HIGH (ref 0.00–0.07)
Basophils Absolute: 0 10*3/uL (ref 0.0–0.1)
Basophils Relative: 0 %
Eosinophils Absolute: 0 10*3/uL (ref 0.0–0.5)
Eosinophils Relative: 0 %
HCT: 31.4 % — ABNORMAL LOW (ref 39.0–52.0)
Hemoglobin: 10.5 g/dL — ABNORMAL LOW (ref 13.0–17.0)
Immature Granulocytes: 2 %
Lymphocytes Relative: 14 %
Lymphs Abs: 1.7 10*3/uL (ref 0.7–4.0)
MCH: 30.9 pg (ref 26.0–34.0)
MCHC: 33.4 g/dL (ref 30.0–36.0)
MCV: 92.4 fL (ref 80.0–100.0)
Monocytes Absolute: 0.8 10*3/uL (ref 0.1–1.0)
Monocytes Relative: 7 %
Neutro Abs: 9.2 10*3/uL — ABNORMAL HIGH (ref 1.7–7.7)
Neutrophils Relative %: 77 %
Platelets: 287 10*3/uL (ref 150–400)
RBC: 3.4 MIL/uL — ABNORMAL LOW (ref 4.22–5.81)
RDW: 13.2 % (ref 11.5–15.5)
WBC: 11.9 10*3/uL — ABNORMAL HIGH (ref 4.0–10.5)
nRBC: 0 % (ref 0.0–0.2)

## 2019-07-09 LAB — GLUCOSE, CAPILLARY: Glucose-Capillary: 119 mg/dL — ABNORMAL HIGH (ref 70–99)

## 2019-07-09 LAB — MAGNESIUM: Magnesium: 2.5 mg/dL — ABNORMAL HIGH (ref 1.7–2.4)

## 2019-07-09 LAB — PHOSPHORUS: Phosphorus: 2.8 mg/dL (ref 2.5–4.6)

## 2019-07-09 LAB — C-REACTIVE PROTEIN: CRP: 3.5 mg/dL — ABNORMAL HIGH (ref <1.0)

## 2019-07-09 LAB — FIBRIN DERIVATIVES D-DIMER (ARMC ONLY): Fibrin derivatives D-dimer (ARMC): 602.12 ng/mL (FEU) — ABNORMAL HIGH (ref 0.00–499.00)

## 2019-07-09 LAB — FERRITIN: Ferritin: 1383 ng/mL — ABNORMAL HIGH (ref 24–336)

## 2019-07-09 MED ORDER — GUAIFENESIN ER 600 MG PO TB12
1200.0000 mg | ORAL_TABLET | Freq: Two times a day (BID) | ORAL | Status: DC
  Administered 2019-07-09 – 2019-07-12 (×7): 1200 mg via ORAL
  Filled 2019-07-09 (×7): qty 2

## 2019-07-09 MED ORDER — PROCHLORPERAZINE EDISYLATE 10 MG/2ML IJ SOLN
10.0000 mg | Freq: Once | INTRAMUSCULAR | Status: AC | PRN
  Administered 2019-07-09: 21:00:00 10 mg via INTRAVENOUS
  Filled 2019-07-09: qty 2

## 2019-07-09 MED ORDER — DIPHENHYDRAMINE HCL 50 MG/ML IJ SOLN
25.0000 mg | Freq: Once | INTRAMUSCULAR | Status: AC | PRN
  Administered 2019-07-09: 21:00:00 25 mg via INTRAVENOUS
  Filled 2019-07-09: qty 1

## 2019-07-09 MED ORDER — ACETAMINOPHEN 500 MG PO TABS
1000.0000 mg | ORAL_TABLET | Freq: Three times a day (TID) | ORAL | Status: DC
  Administered 2019-07-09 (×3): 1000 mg via ORAL
  Filled 2019-07-09 (×3): qty 2

## 2019-07-09 MED ORDER — OLANZAPINE 2.5 MG PO TABS
2.5000 mg | ORAL_TABLET | Freq: Three times a day (TID) | ORAL | Status: DC | PRN
  Filled 2019-07-09: qty 1

## 2019-07-09 NOTE — Consult Note (Addendum)
[facility] Face-to-Face Psychiatry Consult   Reason for Consult:  Hallucinations Referring Physician:  Dr. Billie Ruddy Patient Identification: Craig Avery MRN:  [id number] Diagnosis:   Visual hallucinations, atypical, close eyed R/o Charles Bonet syndrome R/o psychosis due to general medical condition/medication  Total Time spent with patient:  38 minutes   HPI:   Patient is an 78 year old male with no known psychiatric history. He presented to the emergency department due to generalized weakness and fever. Diagnosed with COVID-19. Psychiatry is being seen due to the onset of visual hallucinations over the past two days. Interestingly, says that these hallucinations only occur when he closes his eyes. He had been seeing people in his room which scared him yesterday, causing him to have a panic attack, according to the nurse. Poor benefit from ativan and haldol so far.   Today, he tells me that he is able to close his eyes and see the wall in front of him change color and texture. This is never occurred in his life before. Says that he did have a ophthalmology appointment and is supposed to have his left eye checked out; has an appointment beginning of January 2021 in regard to this. No eye surgeries in the past. He denies olfactory  or auditory hallucinations.  In terms of his mood, says that he has been feeling relatively well without any appreciable anxiety depression or irritability. He usually does not have problems sleeping at home but did have problems Friday night and then notes that the hallucinations had started the next day. Denies a history of suicide or homicide radiation. Says that his family is very supportive and that his daughters take care of him after his wife died last year. Denies any particular stressors in his life. History of delusions, PTSD OCD or mania. He received Compazine and Benadryl last night and he did sleep well.   Past Psychiatric History: None   Past Medical History:   Past Medical History:  Diagnosis Date  . Carotid stenosis 01/2013   bilateral, s/p L CEA, plan rpt [place] qyear  . History of chicken pox   . HLD (hyperlipidemia)   . HTN (hypertension)   . 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 Obesity   . Stroke [facility]) 05/2012   R PCA territory, presented with L paresthesias    Past Surgical History:  Procedure Laterality Date  . CAROTID ENDARTERECTOMY Left 07-21-12   cea  . CATARACT EXTRACTION  2004   bilateral  . COLONOSCOPY WITH PROPOFOL N/A 06/24/2015   3 polyps, one with high grade dysplasia, rpt 3 mo; [name], MD  . COLONOSCOPY WITH PROPOFOL N/A 10/14/2015   hyperplastic polyp; f/u PRN; [name], MD  . ENDARTERECTOMY  07/21/2012   Left CEA; Surgeon: [name], MD  . EYE SURGERY    . MELANOMA EXCISION  12/30/2016   right side of head above ear  . PATCH ANGIOPLASTY  07/21/2012   Procedure: PATCH ANGIOPLASTY;  Surgeon: [name], MD;  Location: [facility] OR;  Service: Vascular;  Laterality: Left;  Using 1 cm x 6 cm Vascu Guard patch   . TEE WITHOUT CARDIOVERSION  05/22/2012   Procedure: TRANSESOPHAGEAL ECHOCARDIOGRAM (TEE);  Surgeon: [name], MD;  Location: [facility] ENDOSCOPY;  Service: Cardiovascular;  Laterality: N/A;  . WISDOM TOOTH EXTRACTION     Family History:  Family History  Problem Relation Age of Onset  . CAD Brother 12  MI  . Heart disease Brother        before age 4  . Heart attack Brother   . Hypertension Mother   . Cirrhosis Father 74       EtOHic  . Aneurysm Paternal Uncle 80  . Heart disease Daughter   . Hyperlipidemia Daughter    Family Psychiatric  History: None Social History:  Social History   Substance and Sexual Activity  Alcohol Use Yes  . Alcohol/week: 4.0 - 5.0 standard drinks  . Types: 4 - 5 Cans of beer per week     Social History   Substance and Sexual Activity  Drug Use No    Social History   Socioeconomic  History  . Marital status: Married    Spouse name: Not on file  . Number of children: Not on file  . Years of education: Not on file  . Highest education level: Not on file  Occupational History  . Not on file  Tobacco Use  . Smoking status: Former Smoker    Packs/day: 1.50    Years: 33.00    Pack years: 49.50    Types: Cigarettes    Quit date: 06/18/1994    Years since quitting: 25.0  . Smokeless tobacco: Never Used  Substance and Sexual Activity  . Alcohol use: Yes    Alcohol/week: 4.0 - 5.0 standard drinks    Types: 4 - 5 Cans of beer per week  . Drug use: No  . Sexual activity: Yes  Other Topics Concern  . Not on file  Social History Narrative   Caffeine: light coffee in am, working on cutting back   Lives with wife, 1 dog.  Daughter next door.  Other children nearby.   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   Diet: not much water, some fruits/vegetables       Advanced directives - does not want prolonged life support if irreversible damage. Would want wife and daughters to be HCPOA. Requests info today.   Social Determinants of Health   Financial Resource Strain:   . Difficulty of Paying Living Expenses: Not on file  Food Insecurity:   . Worried About Charity fundraiser in the Last Year: Not on file  . Ran Out of Food in the Last Year: Not on file  Transportation Needs:   . Lack of Transportation (Medical): Not on file  . Lack of Transportation (Non-Medical): Not on file  Physical Activity:   . Days of Exercise per Week: Not on file  . Minutes of Exercise per Session: Not on file  Stress:   . Feeling of Stress : Not on file  Social Connections:   . Frequency of Communication with Friends and Family: Not on file  . Frequency of Social Gatherings with Friends and Family: Not on file  . Attends Religious Services: Not on file  . Active Member of Clubs or Organizations:  Not on file  . Attends Archivist Meetings: Not on file  . Marital Status: Not on file   Additional Social History:    Allergies:  No Known Allergies  Labs:  Results for orders placed or performed during the hospital encounter of 07/06/19 (from the past 48 hour(s))  CBC with Differential/Platelet     Status: Abnormal   Collection Time: 07/08/19  5:54 AM  Result Value Ref Range   WBC 10.0 4.0 - 10.5 K/uL  RBC 3.78 (L) 4.22 - 5.81 MIL/uL   Hemoglobin 11.7 (L) 13.0 - 17.0 g/dL   HCT 34.8 (L) 39.0 - 52.0 %   MCV 92.1 80.0 - 100.0 fL   MCH 31.0 26.0 - 34.0 pg   MCHC 33.6 30.0 - 36.0 g/dL   RDW 12.9 11.5 - 15.5 %   Platelets 267 150 - 400 K/uL   nRBC 0.0 0.0 - 0.2 %   Neutrophils Relative % 77 %   Neutro Abs 7.7 1.7 - 7.7 K/uL   Lymphocytes Relative 18 %   Lymphs Abs 1.8 0.7 - 4.0 K/uL   Monocytes Relative 4 %   Monocytes Absolute 0.4 0.1 - 1.0 K/uL   Eosinophils Relative 0 %   Eosinophils Absolute 0.0 0.0 - 0.5 K/uL   Basophils Relative 0 %   Basophils Absolute 0.0 0.0 - 0.1 K/uL   Immature Granulocytes 1 %   Abs Immature Granulocytes 0.09 (H) 0.00 - 0.07 K/uL    Comment: Performed at [facility], [address] [place], [place] [address]  Comprehensive metabolic panel     Status: Abnormal   Collection Time: 07/08/19  5:54 AM  Result Value Ref Range   Sodium 142 135 - 145 mmol/L   Potassium 4.4 3.5 - 5.1 mmol/L   Chloride 111 98 - 111 mmol/L   CO2 20 (L) 22 - 32 mmol/L   Glucose, Bld 151 (H) 70 - 99 mg/dL   BUN 42 (H) 8 - 23 mg/dL   Creatinine, Ser 1.56 (H) 0.61 - 1.24 mg/dL   Calcium 7.7 (L) 8.9 - 10.3 mg/dL   Total Protein 7.6 6.5 - 8.1 g/dL   Albumin 3.5 3.5 - 5.0 g/dL   AST 25 15 - 41 U/L   ALT 24 0 - 44 U/L   Alkaline Phosphatase 52 38 - 126 U/L   Total Bilirubin 0.7 0.3 - 1.2 mg/dL   GFR calc non Af Amer 41 (L) >60 mL/min   GFR calc Af Amer 48 (L) >60 mL/min   Anion gap 11 5 - 15    Comment: Performed at [facility], [address] [place], [place] [address]  C-reactive protein     Status: Abnormal   Collection Time: 07/08/19  5:54 AM  Result Value Ref Range   CRP 7.5 (H) <1.0 mg/dL    Comment: Performed at [facility], [address] [place], [place] [address]  Fibrin derivatives D-Dimer [facility] only)     Status: Abnormal   Collection Time: 07/08/19  5:54 AM  Result Value Ref Range   Fibrin derivatives D-dimer ([facility]) 796.12 (H) 0.00 - 499.00 ng/mL (FEU)    Comment: (NOTE) <> Exclusion of Venous Thromboembolism (VTE) - OUTPATIENT ONLY   (Emergency Department or [place])   0-499 ng/ml (FEU): With a low to intermediate pretest probability                      for VTE this test result excludes the diagnosis                      of VTE.   >499 ng/ml (FEU) : VTE not excluded; additional work up for VTE is                      required. <> Testing on Inpatients and Evaluation of Disseminated Intravascular   Coagulation (DIC) Reference Range:   0-499 ng/ml (FEU) Performed at  [facility], [address] [place], [place] [address]   Ferritin     Status: Abnormal   Collection Time: 07/08/19  5:54 AM  Result Value Ref Range   Ferritin 1,728 (H) 24 - 336 ng/mL    Comment: Performed at [facility], [address] [place], [place] [address]  Magnesium     Status: Abnormal   Collection Time: 07/08/19  5:54 AM  Result Value Ref Range   Magnesium 2.6 (H) 1.7 - 2.4 mg/dL    Comment: Performed at [facility], [address] [place], [place] [address]  Phosphorus     Status: Abnormal   Collection Time: 07/08/19  5:54 AM  Result Value Ref Range   Phosphorus 1.9 (L) 2.5 - 4.6 mg/dL    Comment: Performed at [facility], [address] [place], [place] [address]  CBC with Differential/Platelet     Status: Abnormal   Collection Time: 07/09/19  6:18 AM  Result Value Ref Range   WBC 11.9 (H) 4.0 - 10.5 K/uL   RBC 3.40 (L) 4.22 - 5.81 MIL/uL   Hemoglobin 10.5 (L)  13.0 - 17.0 g/dL   HCT 31.4 (L) 39.0 - 52.0 %   MCV 92.4 80.0 - 100.0 fL   MCH 30.9 26.0 - 34.0 pg   MCHC 33.4 30.0 - 36.0 g/dL   RDW 13.2 11.5 - 15.5 %   Platelets 287 150 - 400 K/uL   nRBC 0.0 0.0 - 0.2 %   Neutrophils Relative % 77 %   Neutro Abs 9.2 (H) 1.7 - 7.7 K/uL   Lymphocytes Relative 14 %   Lymphs Abs 1.7 0.7 - 4.0 K/uL   Monocytes Relative 7 %   Monocytes Absolute 0.8 0.1 - 1.0 K/uL   Eosinophils Relative 0 %   Eosinophils Absolute 0.0 0.0 - 0.5 K/uL   Basophils Relative 0 %   Basophils Absolute 0.0 0.0 - 0.1 K/uL   Immature Granulocytes 2 %   Abs Immature Granulocytes 0.22 (H) 0.00 - 0.07 K/uL    Comment: Performed at [facility], [address] [place], [place] [address]  Comprehensive metabolic panel     Status: Abnormal   Collection Time: 07/09/19  6:18 AM  Result Value Ref Range   Sodium 142 135 - 145 mmol/L   Potassium 4.2 3.5 - 5.1 mmol/L   Chloride 111 98 - 111 mmol/L   CO2 20 (L) 22 - 32 mmol/L   Glucose, Bld 129 (H) 70 - 99 mg/dL   BUN 40 (H) 8 - 23 mg/dL   Creatinine, Ser 1.53 (H) 0.61 - 1.24 mg/dL   Calcium 7.3 (L) 8.9 - 10.3 mg/dL   Total Protein 6.6 6.5 - 8.1 g/dL   Albumin 3.2 (L) 3.5 - 5.0 g/dL   AST 31 15 - 41 U/L   ALT 22 0 - 44 U/L   Alkaline Phosphatase 45 38 - 126 U/L   Total Bilirubin 0.6 0.3 - 1.2 mg/dL   GFR calc non Af Amer 42 (L) >60 mL/min   GFR calc Af Amer 49 (L) >60 mL/min   Anion gap 11 5 - 15    Comment: Performed at [facility], [address] [place],  [address]  C-reactive protein     Status: Abnormal   Collection Time: 07/09/19  6:18 AM  Result Value Ref Range   CRP 3.5 (H) <1.0 mg/dL    Comment: Performed at [facility], [address] [place], [place] [address]  Fibrin derivatives D-Dimer [facility] only)     Status: Abnormal   Collection Time: 07/09/19  6:18 AM  Result Value Ref Range   Fibrin derivatives D-dimer ([facility]) 602.12 (H) 0.00 - 499.00 ng/mL (FEU)    Comment: (NOTE) <>  Exclusion of Venous Thromboembolism (VTE) - OUTPATIENT ONLY   (Emergency Department or [place])   0-499 ng/ml (FEU): With a low to intermediate pretest probability                      for VTE this test result excludes the diagnosis                      of VTE.   >499 ng/ml (FEU) : VTE not excluded; additional work up for VTE is                      required. <> Testing on Inpatients and Evaluation of Disseminated Intravascular   Coagulation (DIC) Reference Range:   0-499 ng/ml (FEU) Performed at [facility], [address] [place], [place] [address]   Ferritin     Status: Abnormal   Collection Time: 07/09/19  6:18 AM  Result Value Ref Range   Ferritin 1,383 (H) 24 - 336 ng/mL    Comment: Performed at [facility], [address] [place], [place] [address]  Magnesium     Status: Abnormal   Collection Time: 07/09/19  6:18 AM  Result Value Ref Range   Magnesium 2.5 (H) 1.7 - 2.4 mg/dL    Comment: Performed at [facility], [address], [place], [place] [address]  Phosphorus     Status: None   Collection Time: 07/09/19  6:18 AM  Result Value Ref Range   Phosphorus 2.8 2.5 - 4.6 mg/dL    Comment: Performed at [facility], [address] [place], [place] [address]  Glucose, capillary     Status: Abnormal   Collection Time: 07/09/19  8:05 AM  Result Value Ref Range   Glucose-Capillary 119 (H) 70 - 99 mg/dL    Current Facility-Administered Medications  Medication Dose Route Frequency Provider Last Rate Last Admin  . acetaminophen (TYLENOL) tablet 1,000 mg  1,000 mg Oral TID Enzo Bi, MD   1,000 mg at 07/09/19 0851  . amLODipine (NORVASC) tablet 10 mg  10 mg Oral Daily Lorella Nimrod, MD   10 mg at 07/09/19 0851  . ascorbic acid (VITAMIN C) tablet 500 mg  500 mg Oral Daily Lorella Nimrod, MD   500 mg at 07/09/19 0851  . aspirin EC tablet 81 mg  81 mg Oral Daily Lorella Nimrod, MD   81 mg at 07/09/19 0851  . atorvastatin (LIPITOR) tablet 20  mg  20 mg Oral Q2000 Lorella Nimrod, MD   20 mg at 07/08/19 2017  . chlorpheniramine-HYDROcodone (TUSSIONEX) 10-8 MG/5ML suspension 5 mL  5 mL Oral Q12H PRN Lorella Nimrod, MD      . clopidogrel (PLAVIX) tablet 75 mg  75 mg Oral Daily Lorella Nimrod, MD   75 mg at 07/09/19 0851  . dexamethasone (DECADRON) injection 6 mg  6 mg Intravenous Q24H Lorella Nimrod, MD   6 mg at 07/09/19 0843  . docusate sodium (COLACE) capsule 100 mg  100 mg Oral Daily PRN Lorella Nimrod, MD      . enoxaparin (LOVENOX) injection 40 mg  40 mg Subcutaneous Q24H Oswald Hillock, RPH   40  mg at 07/08/19 2016  . guaiFENesin (MUCINEX) 12 hr tablet 1,200 mg  1,200 mg Oral BID Enzo Bi, MD   1,200 mg at 07/09/19 [id number]  . Ipratropium-Albuterol (COMBIVENT) respimat 1 puff  1 puff Inhalation Q6H Lorella Nimrod, MD   1 puff at 07/09/19 1330  . multivitamin with minerals tablet 1 tablet  1 tablet Oral Daily Lorella Nimrod, MD   1 tablet at 07/09/19 0851  . ondansetron (ZOFRAN) tablet 4 mg  4 mg Oral Q6H PRN Lorella Nimrod, MD       Or  . ondansetron (ZOFRAN) injection 4 mg  4 mg Intravenous Q6H PRN Lorella Nimrod, MD   4 mg at 07/08/19 1143  . polyethylene glycol (MIRALAX / GLYCOLAX) packet 17 g  17 g Oral Daily PRN Lorella Nimrod, MD      . prednisoLONE acetate (PRED FORTE) 1 % ophthalmic suspension 1 drop  1 drop Both Eyes QID Lorella Nimrod, MD   1 drop at 07/09/19 1331  . remdesivir 100 mg in sodium chloride 0.9 % 100 mL IVPB  100 mg Intravenous Daily Lorella Nimrod, MD 200 mL/hr at 07/09/19 0850 100 mg at 07/09/19 0850  . vitamin B-12 (CYANOCOBALAMIN) tablet 500 mcg  500 mcg Oral Daily Lorella Nimrod, MD   500 mcg at 07/09/19 0852  . zinc sulfate capsule 220 mg  220 mg Oral Daily Lorella Nimrod, MD   220 mg at 07/09/19 [id number]    Musculoskeletal: Strength & Muscle Tone: within normal limits Gait & Station: normal Patient leans: Right  Psychiatric Specialty Exam: Physical Exam  Review of Systems  Blood pressure (!) 169/74, pulse 88,  temperature 98.2 F (36.8 C), temperature source Oral, resp. rate 18, height 5\' 4"  (1.626 m), weight 92.1 kg, SpO2 93 %.Body mass index is 34.85 kg/m.  General Appearance: Casual  Eye Contact:  Good  Speech:  Normal Rate  Volume:  Normal  Mood:  Euthymic  Affect:  Appropriate  Thought Process:  Coherent  Orientation:  Negative  Thought Content:  Negative  Suicidal Thoughts:  No  Homicidal Thoughts:  No  Memory:  NA  Judgement:  Fair  Insight:  Good  Psychomotor Activity:  Normal  Concentration:  Concentration: Good  Recall:  Good  Fund of Knowledge:  Good  Language:  Good  Akathisia:  NA  Handed:  Left  AIMS (if indicated):     Assets:  Communication Skills  ADL's:  Intact  Cognition:  WNL  Sleep:        Treatment Plan Summary: Well trial low-dose Zyprexa 2.5 mg TIDPRN hallucinations. Risks benefits s/e and alternatives reviewed.  May increase to 5 mg TID PRN if need   Disposition: No evidence of imminent risk to self or others at present.    Rulon Sera, MD 07/09/2019 3:09 PM

## 2019-07-09 NOTE — Progress Notes (Signed)
Pt c/o unable to sleep, very anxious, secure chat MD, orders given

## 2019-07-09 NOTE — Care Management Important Message (Signed)
Important Message  Patient Details  Name: Craig Avery MRN: [id number] Date of Birth: Nov 12, 1940   Medicare Important Message Given:  Yes - Important Message mailed due to current National Emergency  Verbally reviewed with patient via room phone.  Requested copy be mailed to his home address on file.    Dannette Barbara 07/09/2019, 12:09 PM

## 2019-07-09 NOTE — Progress Notes (Signed)
PROGRESS NOTE    Kendrick Ledyard [facility]  [id number] DOB: 12-Sep-1940 DOA: 07/06/2019 PCP: [name], MD    Assessment & Plan:   Active Problems:   COVID-19    Craig Avery is a 78 y.o. male with medical history significant of hypertension, CKD stage III, peripheral vascular disease, CVA, trolled carotid stenosis and hyperlipidemia presented to ED with complaints of fever and generalized weakness.   Acute hypoxic respiratory failure 2/2 Pneumonia due to COVID-19 virus.   Positive for COVID-19 with elevated inflammatory markers and radiologic evidence of bilateral infiltrate.  Needed 2-3L O2 initially, however, went up to 5L today. PLAN: -continue remdesivir and Decadron Day 4 -Monitor inflammatory markers. -Vitamin C and zinc supplement. -wean o2 as able -CXR today showed "Slightly progressive hazy bilateral infiltrates consistent with COVID pneumonia." --Tylenol scheduled for pleuritic chest pain   AKI with CKD stage III, resolved Creatinine at 2.48 with baseline at 2.  Cr back to baseline at 2.01 today after IV hydration. -d/c MIVF today since pt's O2 requirement increased  Hypertension.   Pressure mildly elevated. -Continue amlodipine. -Holding home Zestoretic due to AKI  Peripheral vascular disease.   Hx of CVA -Continue home dose of aspirin and Plavix. -Continue statin.  Visual hallucination Anxiety and panic attack --Visual hallucination only pt closes his eyes, never happened before.  Pt is very anxious about being in the hospital, per daughter. --IV haldol 5 mg x1 reportedly made him feel worse.  IV ativan didn't help.  Migraine cocktail helped him to sleep. PLAN: --daughters to call and reassure him --psych consult today and recommended Zyprexa 2.5 mg TID PRN hallucinations --tele sitter    DVT prophylaxis: Lovenox SQ Code Status: Full code  Family Communication: updated daughter today Disposition Plan: to be determined    Subjective and  Interval History:  Pt finally got some sleep last night after IV benadryl and IV compazine.  This morning, pt reported feeling better, still seeing images when he closes his eyes, but only blank wall, nothing disturbing.  No fever, chest pain, abdominal pain, N/V/D, dysuria.  Ate a little more.  Nursing reported O2 requirement increasing, and pain with breathing, and called rapid.  Pt put on Highflow up to 5L.     Objective: Vitals:   07/08/19 1447 07/09/19 0544 07/09/19 0546 07/09/19 1208  BP: (!) 172/75 (!) 143/71  (!) 169/74  Pulse: 98 85 82 88  Resp: (!) 24 19  18   Temp: 97.8 F (36.6 C) 98 F (36.7 C)  98.2 F (36.8 C)  TempSrc: Oral Oral  Oral  SpO2: 92% (!) 89% 93% 93%  Weight:      Height:        Intake/Output Summary (Last 24 hours) at 07/09/2019 1602 Last data filed at 07/09/2019 1342 Gross per 24 hour  Intake 884.05 ml  Output 400 ml  Net 484.05 ml   Filed Weights   07/06/19 1118  Weight: 92.1 kg    Examination:   Constitutional: NAD, AAOx3 HEENT: conjunctivae and lids normal, EOMI CV: RRR no M,R,G. Distal pulses +2.  No cyanosis.   RESP: CTA B/L over anterior, good air movement, normal respiratory effort, on 5L GI: +BS, NTND Extremities: No effusions, edema, or tenderness in BLE SKIN: warm, dry and intact Neuro: II - XII grossly intact.  Sensation intact Psych: anxious and labile mood and affect.     Data Reviewed: I have personally reviewed following labs and imaging studies  CBC: Recent Labs  Lab 07/06/19  1133 07/06/19 1603 07/07/19 0509 07/08/19 0554 07/09/19 0618  WBC 6.3 7.2 7.4 10.0 11.9*  NEUTROABS 4.1  --  5.2 7.7 9.2*  HGB 12.2* 11.1* 10.6* 11.7* 10.5*  HCT 35.3* 31.8* 31.3* 34.8* 31.4*  MCV 88.0 87.6 92.3 92.1 92.4  PLT 211 175 200 267 [id number]   Basic Metabolic Panel: Recent Labs  Lab 07/06/19 1133 07/07/19 0509 07/08/19 0554 07/09/19 0618  NA 135 141 142 142  K 4.8 4.6 4.4 4.2  CL 102 109 111 111  CO2 21* 22 20* 20*    GLUCOSE 120* 136* 151* 129*  BUN 53* 48* 42* 40*  CREATININE 2.48* 2.01* 1.56* 1.53*  CALCIUM 8.0* 7.3* 7.7* 7.3*  MG  --  2.6* 2.6* 2.5*  PHOS  --  2.8 1.9* 2.8   GFR: Estimated Creatinine Clearance: 39.4 mL/min (A) (by C-G formula based on SCr of 1.53 mg/dL (H)). Liver Function Tests: Recent Labs  Lab 07/06/19 1133 07/07/19 0509 07/08/19 0554 07/09/19 0618  AST 28 22 25 31   ALT 29 23 24 22   ALKPHOS 55 46 52 45  BILITOT 0.7 0.4 0.7 0.6  PROT 7.7 6.8 7.6 6.6  ALBUMIN 3.9 3.2* 3.5 3.2*   No results for input(s): LIPASE, AMYLASE in the last 168 hours. No results for input(s): AMMONIA in the last 168 hours. Coagulation Profile: No results for input(s): INR, PROTIME in the last 168 hours. Cardiac Enzymes: No results for input(s): CKTOTAL, CKMB, CKMBINDEX, TROPONINI in the last 168 hours. BNP (last 3 results) No results for input(s): PROBNP in the last 8760 hours. HbA1C: No results for input(s): HGBA1C in the last 72 hours. CBG: Recent Labs  Lab 07/09/19 0805  GLUCAP 119*   Lipid Profile: No results for input(s): CHOL, HDL, LDLCALC, TRIG, CHOLHDL, LDLDIRECT in the last 72 hours. Thyroid Function Tests: No results for input(s): TSH, T4TOTAL, FREET4, T3FREE, THYROIDAB in the last 72 hours. Anemia Panel: Recent Labs    07/08/19 0554 07/09/19 0618  FERRITIN 1,728* 1,383*   Sepsis Labs: Recent Labs  Lab 07/06/19 1133  PROCALCITON 0.14  LATICACIDVEN 1.5    No results found for this or any previous visit (from the past 240 hour(s)).    Radiology Studies: DG Chest Port 1 View  Result Date: 07/09/2019 CLINICAL DATA:  COVID pneumonia. EXAM: PORTABLE CHEST 1 VIEW COMPARISON:  Chest x-ray 07/06/2011 FINDINGS: The cardiac silhouette, mediastinal and hilar contours are within normal limits and stable. Persistent patchy hazy bilateral infiltrates consistent with COVID pneumonia. Findings are slightly progressive. No pleural effusions. The bony thorax is intact.  IMPRESSION: Slightly progressive hazy bilateral infiltrates consistent with COVID pneumonia. Electronically Signed   By: Marijo Sanes M.D.   On: 07/09/2019 08:27     Scheduled Meds: . acetaminophen  1,000 mg Oral TID  . amLODipine  10 mg Oral Daily  . vitamin C  500 mg Oral Daily  . aspirin EC  81 mg Oral Daily  . atorvastatin  20 mg Oral Q2000  . clopidogrel  75 mg Oral Daily  . dexamethasone (DECADRON) injection  6 mg Intravenous Q24H  . enoxaparin (LOVENOX) injection  40 mg Subcutaneous Q24H  . guaiFENesin  1,200 mg Oral BID  . Ipratropium-Albuterol  1 puff Inhalation Q6H  . multivitamin with minerals  1 tablet Oral Daily  . prednisoLONE acetate  1 drop Both Eyes QID  . vitamin B-12  500 mcg Oral Daily  . zinc sulfate  220 mg Oral Daily   Continuous Infusions: . remdesivir  100 mg in NS 100 mL 100 mg (07/09/19 0850)     LOS: 3 days     Enzo Bi, MD [facility] If 7PM-7AM, please contact night-coverage 07/09/2019, 4:02 PM

## 2019-07-10 LAB — COMPREHENSIVE METABOLIC PANEL
ALT: 75 U/L — ABNORMAL HIGH (ref 0–44)
AST: 75 U/L — ABNORMAL HIGH (ref 15–41)
Albumin: 3.4 g/dL — ABNORMAL LOW (ref 3.5–5.0)
Alkaline Phosphatase: 53 U/L (ref 38–126)
Anion gap: 11 (ref 5–15)
BUN: 43 mg/dL — ABNORMAL HIGH (ref 8–23)
CO2: 21 mmol/L — ABNORMAL LOW (ref 22–32)
Calcium: 7.9 mg/dL — ABNORMAL LOW (ref 8.9–10.3)
Chloride: 111 mmol/L (ref 98–111)
Creatinine, Ser: 1.54 mg/dL — ABNORMAL HIGH (ref 0.61–1.24)
GFR calc Af Amer: 49 mL/min — ABNORMAL LOW (ref >60)
GFR calc non Af Amer: 42 mL/min — ABNORMAL LOW (ref >60)
Glucose, Bld: 107 mg/dL — ABNORMAL HIGH (ref 70–99)
Potassium: 4.2 mmol/L (ref 3.5–5.1)
Sodium: 143 mmol/L (ref 135–145)
Total Bilirubin: 0.9 mg/dL (ref 0.3–1.2)
Total Protein: 7.2 g/dL (ref 6.5–8.1)

## 2019-07-10 LAB — CBC WITH DIFFERENTIAL/PLATELET
Abs Immature Granulocytes: 0.21 10*3/uL — ABNORMAL HIGH (ref 0.00–0.07)
Basophils Absolute: 0 10*3/uL (ref 0.0–0.1)
Basophils Relative: 0 %
Eosinophils Absolute: 0 10*3/uL (ref 0.0–0.5)
Eosinophils Relative: 0 %
HCT: 31.4 % — ABNORMAL LOW (ref 39.0–52.0)
Hemoglobin: 10.5 g/dL — ABNORMAL LOW (ref 13.0–17.0)
Immature Granulocytes: 2 %
Lymphocytes Relative: 16 %
Lymphs Abs: 1.8 10*3/uL (ref 0.7–4.0)
MCH: 31.2 pg (ref 26.0–34.0)
MCHC: 33.4 g/dL (ref 30.0–36.0)
MCV: 93.2 fL (ref 80.0–100.0)
Monocytes Absolute: 0.7 10*3/uL (ref 0.1–1.0)
Monocytes Relative: 7 %
Neutro Abs: 8.1 10*3/uL — ABNORMAL HIGH (ref 1.7–7.7)
Neutrophils Relative %: 75 %
Platelets: 333 10*3/uL (ref 150–400)
RBC: 3.37 MIL/uL — ABNORMAL LOW (ref 4.22–5.81)
RDW: 13 % (ref 11.5–15.5)
WBC: 10.9 10*3/uL — ABNORMAL HIGH (ref 4.0–10.5)
nRBC: 0 % (ref 0.0–0.2)

## 2019-07-10 LAB — FERRITIN: Ferritin: 1508 ng/mL — ABNORMAL HIGH (ref 24–336)

## 2019-07-10 LAB — C-REACTIVE PROTEIN: CRP: 2.2 mg/dL — ABNORMAL HIGH (ref <1.0)

## 2019-07-10 LAB — FIBRIN DERIVATIVES D-DIMER (ARMC ONLY): Fibrin derivatives D-dimer (ARMC): 533.03 ng/mL (FEU) — ABNORMAL HIGH (ref 0.00–499.00)

## 2019-07-10 LAB — MAGNESIUM: Magnesium: 2.6 mg/dL — ABNORMAL HIGH (ref 1.7–2.4)

## 2019-07-10 LAB — PHOSPHORUS: Phosphorus: 3.4 mg/dL (ref 2.5–4.6)

## 2019-07-10 MED ORDER — HYDROCHLOROTHIAZIDE 12.5 MG PO CAPS
12.5000 mg | ORAL_CAPSULE | Freq: Every day | ORAL | Status: DC
  Administered 2019-07-10 – 2019-07-12 (×3): 12.5 mg via ORAL
  Filled 2019-07-10 (×3): qty 1

## 2019-07-10 MED ORDER — PROCHLORPERAZINE MALEATE 10 MG PO TABS
10.0000 mg | ORAL_TABLET | Freq: Every day | ORAL | Status: DC | PRN
  Filled 2019-07-10: qty 1

## 2019-07-10 MED ORDER — ENSURE ENLIVE PO LIQD
1.0000 | Freq: Three times a day (TID) | ORAL | Status: DC
  Administered 2019-07-10 – 2019-07-12 (×5): 237 mL via ORAL

## 2019-07-10 MED ORDER — HYDRALAZINE HCL 25 MG PO TABS
25.0000 mg | ORAL_TABLET | Freq: Three times a day (TID) | ORAL | Status: DC
  Administered 2019-07-10: 25 mg via ORAL
  Filled 2019-07-10 (×2): qty 1

## 2019-07-10 MED ORDER — LISINOPRIL 20 MG PO TABS
20.0000 mg | ORAL_TABLET | Freq: Every day | ORAL | Status: DC
  Administered 2019-07-10 – 2019-07-12 (×3): 20 mg via ORAL
  Filled 2019-07-10 (×3): qty 1

## 2019-07-10 MED ORDER — DIPHENHYDRAMINE HCL 25 MG PO CAPS
25.0000 mg | ORAL_CAPSULE | Freq: Every day | ORAL | Status: DC | PRN
  Administered 2019-07-10: 25 mg via ORAL
  Filled 2019-07-10: qty 1

## 2019-07-10 MED ORDER — LISINOPRIL-HYDROCHLOROTHIAZIDE 20-12.5 MG PO TABS: 1.0000 | ORAL_TABLET | Freq: Every day | ORAL | Status: DC

## 2019-07-10 NOTE — Progress Notes (Addendum)
PROGRESS NOTE    Artha Voncannon [facility]  [id number] DOB: 1941-01-09 DOA: 07/06/2019 PCP: [name], MD    Assessment & Plan:   Active Problems:   COVID-19    KELLI MOSLEY is a 78 y.o. male with medical history significant of hypertension, CKD stage III, peripheral vascular disease, CVA, trolled carotid stenosis and hyperlipidemia presented to ED with complaints of fever and generalized weakness.   Acute hypoxic respiratory failure 2/2 Pneumonia due to COVID-19 virus.   Positive for COVID-19 with elevated inflammatory markers and radiologic evidence of bilateral infiltrate.  Needed 2-3L O2 initially, however, went up to 5L on 12/28, then 8L today. -Repeat CXR showed "Slightly progressive hazy bilateral infiltrates consistent with COVID pneumonia." PLAN: -continue remdesivir and Decadron Day 5 -Monitor inflammatory markers. -Vitamin C and zinc supplement. -wean o2 as able --Incentive spirometry and chest PT (Aerobika)   AKI with CKD stage III, resolved Creatinine at 2.48 with baseline at 2.  Cr back to baseline at 2.01 after IV hydration. --hold MIVF and encourage PO hydration  Hypertension.   Pressure starting to be elevated. -Continue home amlodipine. -resume home Zestoretic today  Peripheral vascular disease.   Hx of CVA -Continue home dose of aspirin and Plavix. -Continue statin.  Visual hallucination, resolved Anxiety and panic attack, improved --Visual hallucination only pt closes his eyes, never happened before.  Pt is very anxious about being in the hospital, per daughter. --IV haldol 5 mg x1 reportedly made him feel worse.  IV ativan didn't help.  Migraine cocktail helped him to sleep. PLAN: --daughters to call and reassure him --Zyprexa 2.5 mg TID PRN hallucinations, per psych --tele sitter  --benadryl+IV compazine PRN for calming and sleeping   DVT prophylaxis: Lovenox SQ Code Status: Full code  Family Communication: updated daughter Bethena Roys  today Disposition Plan: to be determined    Subjective and Interval History:  Pt slept last night, again having received IV benadryl and compazine.  This morning, pt reported feeling "pretty good" (the first positive feeling since presentation), and no longer seeing things with his eyes closed.  Pt did not feel dyspnea so was surprised about his increased O2 requirement.  No fever, chest pain, abdominal pain, N/V/D, dysuria.    O2 turned up to 8L today.   Objective: Vitals:   07/10/19 0559 07/10/19 0851 07/10/19 1124 07/10/19 1500  BP: (!) 155/76  (!) 163/64   Pulse: 88  87   Resp: (!) 22 (!) 26  (!) 24  Temp: 98.3 F (36.8 C)  98.5 F (36.9 C)   TempSrc: Oral  Oral   SpO2: 93% 95% (!) 88% 92%  Weight:      Height:        Intake/Output Summary (Last 24 hours) at 07/10/2019 1820 Last data filed at 07/10/2019 1013 Gross per 24 hour  Intake 336 ml  Output --  Net 336 ml   Filed Weights   07/06/19 1118  Weight: 92.1 kg    Examination:   Constitutional: NAD, AAOx3 HEENT: conjunctivae and lids normal, EOMI CV: RRR no M,R,G. Distal pulses +2.  No cyanosis.   RESP: coarse breath sounds, good air movement, normal respiratory effort, on 8L GI: +BS, NTND Extremities: No effusions, edema, or tenderness in BLE SKIN: warm, dry and intact Neuro: II - XII grossly intact.  Sensation intact Psych: better mood and affect.     Data Reviewed: I have personally reviewed following labs and imaging studies  CBC: Recent Labs  Lab 07/06/19 1133 07/06/19  1603 07/07/19 0509 07/08/19 0554 07/09/19 0618 07/10/19 0312  WBC 6.3 7.2 7.4 10.0 11.9* 10.9*  NEUTROABS 4.1  --  5.2 7.7 9.2* 8.1*  HGB 12.2* 11.1* 10.6* 11.7* 10.5* 10.5*  HCT 35.3* 31.8* 31.3* 34.8* 31.4* 31.4*  MCV 88.0 87.6 92.3 92.1 92.4 93.2  PLT 211 175 200 267 287 0000000   Basic Metabolic Panel: Recent Labs  Lab 07/06/19 1133 07/07/19 0509 07/08/19 0554 07/09/19 0618 07/10/19 0604  NA 135 141 142 142 143  K  4.8 4.6 4.4 4.2 4.2  CL 102 109 111 111 111  CO2 21* 22 20* 20* 21*  GLUCOSE 120* 136* 151* 129* 107*  BUN 53* 48* 42* 40* 43*  CREATININE 2.48* 2.01* 1.56* 1.53* 1.54*  CALCIUM 8.0* 7.3* 7.7* 7.3* 7.9*  MG  --  2.6* 2.6* 2.5* 2.6*  PHOS  --  2.8 1.9* 2.8 3.4   GFR: Estimated Creatinine Clearance: 39.2 mL/min (A) (by C-G formula based on SCr of 1.54 mg/dL (H)). Liver Function Tests: Recent Labs  Lab 07/06/19 1133 07/07/19 0509 07/08/19 0554 07/09/19 0618 07/10/19 0604  AST 28 22 25 31  75*  ALT 29 23 24 22  75*  ALKPHOS 55 46 52 45 53  BILITOT 0.7 0.4 0.7 0.6 0.9  PROT 7.7 6.8 7.6 6.6 7.2  ALBUMIN 3.9 3.2* 3.5 3.2* 3.4*   No results for input(s): LIPASE, AMYLASE in the last 168 hours. No results for input(s): AMMONIA in the last 168 hours. Coagulation Profile: No results for input(s): INR, PROTIME in the last 168 hours. Cardiac Enzymes: No results for input(s): CKTOTAL, CKMB, CKMBINDEX, TROPONINI in the last 168 hours. BNP (last 3 results) No results for input(s): PROBNP in the last 8760 hours. HbA1C: No results for input(s): HGBA1C in the last 72 hours. CBG: Recent Labs  Lab 07/09/19 0805  GLUCAP 119*   Lipid Profile: No results for input(s): CHOL, HDL, LDLCALC, TRIG, CHOLHDL, LDLDIRECT in the last 72 hours. Thyroid Function Tests: No results for input(s): TSH, T4TOTAL, FREET4, T3FREE, THYROIDAB in the last 72 hours. Anemia Panel: Recent Labs    07/09/19 0618 07/10/19 0604  FERRITIN 1,383* 1,508*   Sepsis Labs: Recent Labs  Lab 07/06/19 1133  PROCALCITON 0.14  LATICACIDVEN 1.5    No results found for this or any previous visit (from the past 240 hour(s)).    Radiology Studies: DG Chest Port 1 View  Result Date: 07/09/2019 CLINICAL DATA:  COVID pneumonia. EXAM: PORTABLE CHEST 1 VIEW COMPARISON:  Chest x-ray 07/06/2011 FINDINGS: The cardiac silhouette, mediastinal and hilar contours are within normal limits and stable. Persistent patchy hazy bilateral  infiltrates consistent with COVID pneumonia. Findings are slightly progressive. No pleural effusions. The bony thorax is intact. IMPRESSION: Slightly progressive hazy bilateral infiltrates consistent with COVID pneumonia. Electronically Signed   By: Marijo Sanes M.D.   On: 07/09/2019 08:27     Scheduled Meds: . amLODipine  10 mg Oral Daily  . vitamin C  500 mg Oral Daily  . aspirin EC  81 mg Oral Daily  . atorvastatin  20 mg Oral Q2000  . clopidogrel  75 mg Oral Daily  . dexamethasone (DECADRON) injection  6 mg Intravenous Q24H  . enoxaparin (LOVENOX) injection  40 mg Subcutaneous Q24H  . guaiFENesin  1,200 mg Oral BID  . hydrALAZINE  25 mg Oral Q8H  . Ipratropium-Albuterol  1 puff Inhalation Q6H  . multivitamin with minerals  1 tablet Oral Daily  . prednisoLONE acetate  1 drop Both Eyes  QID  . vitamin B-12  500 mcg Oral Daily  . zinc sulfate  220 mg Oral Daily   Continuous Infusions:    LOS: 4 days     Enzo Bi, MD [facility] If 7PM-7AM, please contact night-coverage 07/10/2019, 6:20 PM

## 2019-07-10 NOTE — Consult Note (Signed)
[facility] Consult   Reason for Consult:  Hallucinations Referring Physician:  Dr. Billie Ruddy Patient Identification: Craig Avery MRN:  [id number] Diagnosis:   Visual hallucinations, atypical, close eyed R/o Charles Bonet syndrome R/o psychosis due to general medical condition/medication  Total Time spent with patient:  25 minutes   12/29 Patient took Benadryl and Compazine last night with good benefit, he slept well.  Denies associated headaches/migraines however she seems to be doing well.  Also, denies open or lows denies hallucinations today.  Feels well.  No other important sharing points today.  12/28 Patient is an 78 year old male with no known psychiatric history. He presented to the emergency department due to generalized weakness and fever. Diagnosed with COVID-19. Psychiatry is being seen due to the onset of visual hallucinations over the past two days. Interestingly, says that these hallucinations only occur when he closes his eyes. He had been seeing people in his room which scared him yesterday, causing him to have a panic attack, according to the nurse. Poor benefit from ativan and haldol so far.   Today, he tells me that he is able to close his eyes and see the wall in front of him change color and texture. This is never occurred in his life before. Says that he did have a ophthalmology appointment and is supposed to have his left eye checked out; has an appointment beginning of January 2021 in regard to this. No eye surgeries in the past. He denies olfactory  or auditory hallucinations.  In terms of his mood, says that he has been feeling relatively well without any appreciable anxiety depression or irritability. He usually does not have problems sleeping at home but did have problems Friday night and then notes that the hallucinations had started the next day. Denies a history of suicide or homicide radiation. Says that his family is very supportive and that his  daughters take care of him after his wife died last year. Denies any particular stressors in his life. History of delusions, PTSD OCD or mania. He received Compazine and Benadryl last night and he did sleep well.   Past Psychiatric History: None   Past Medical History:  Past Medical History:  Diagnosis Date  . Carotid stenosis 01/2013   bilateral, s/p L CEA, plan rpt [place] qyear  . History of chicken pox   . HLD (hyperlipidemia)   . HTN (hypertension)   . 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 Obesity   . Stroke [facility]) 05/2012   R PCA territory, presented with L paresthesias    Past Surgical History:  Procedure Laterality Date  . CAROTID ENDARTERECTOMY Left 07-21-12   cea  . CATARACT EXTRACTION  2004   bilateral  . COLONOSCOPY WITH PROPOFOL N/A 06/24/2015   3 polyps, one with high grade dysplasia, rpt 3 mo; [name], MD  . COLONOSCOPY WITH PROPOFOL N/A 10/14/2015   hyperplastic polyp; f/u PRN; [name], MD  . ENDARTERECTOMY  07/21/2012   Left CEA; Surgeon: [name], MD  . EYE SURGERY    . MELANOMA EXCISION  12/30/2016   right side of head above ear  . PATCH ANGIOPLASTY  07/21/2012   Procedure: PATCH ANGIOPLASTY;  Surgeon: [name], MD;  Location: [facility] OR;  Service: Vascular;  Laterality: Left;  Using 1 cm x 6 cm Vascu Guard patch   . TEE WITHOUT CARDIOVERSION  05/22/2012   Procedure: TRANSESOPHAGEAL ECHOCARDIOGRAM (TEE);  Surgeon: [name], MD;  Location: [facility] ENDOSCOPY;  Service: Cardiovascular;  Laterality: N/A;  . WISDOM TOOTH EXTRACTION     Family History:  Family History  Problem Relation Age of Onset  . CAD Brother 64       MI  . Heart disease Brother        before age 40  . Heart attack Brother   . Hypertension Mother   . Cirrhosis Father 23       EtOHic  . Aneurysm Paternal Uncle 37  . Heart disease Daughter   . Hyperlipidemia Daughter    Family Psychiatric  History:  None Social History:  Social History   Substance and Sexual Activity  Alcohol Use Yes  . Alcohol/week: 4.0 - 5.0 standard drinks  . Types: 4 - 5 Cans of beer per week     Social History   Substance and Sexual Activity  Drug Use No    Social History   Socioeconomic History  . Marital status: Married    Spouse name: Not on file  . Number of children: Not on file  . Years of education: Not on file  . Highest education level: Not on file  Occupational History  . Not on file  Tobacco Use  . Smoking status: Former Smoker    Packs/day: 1.50    Years: 33.00    Pack years: 49.50    Types: Cigarettes    Quit date: 06/18/1994    Years since quitting: 25.0  . Smokeless tobacco: Never Used  Substance and Sexual Activity  . Alcohol use: Yes    Alcohol/week: 4.0 - 5.0 standard drinks    Types: 4 - 5 Cans of beer per week  . Drug use: No  . Sexual activity: Yes  Other Topics Concern  . Not on file  Social History Narrative   Caffeine: light coffee in am, working on cutting back   Lives with wife, 1 dog.  Daughter next door.  Other children nearby.   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   Diet: not much water, some fruits/vegetables       Advanced directives - does not want prolonged life support if irreversible damage. Would want wife and daughters to be HCPOA. Requests info today.   Social Determinants of Health   Financial Resource Strain:   . Difficulty of Paying Living Expenses: Not on file  Food Insecurity:   . Worried About Charity fundraiser in the Last Year: Not on file  . Ran Out of Food in the Last Year: Not on file  Transportation Needs:   . Lack of Transportation (Medical): Not on file  . Lack of Transportation (Non-Medical): Not on file  Physical Activity:   . Days of Exercise per Week: Not on file  . Minutes of Exercise per Session: Not on file  Stress:   .  Feeling of Stress : Not on file  Social Connections:   . Frequency of Communication with Friends and Family: Not on file  . Frequency of Social Gatherings with Friends and Family: Not on file  . Attends Religious Services: Not on file  . Active Member of Clubs or Organizations: Not on file  . Attends Archivist Meetings: Not on file  . Marital Status: Not on file   Additional Social History:    Allergies:  No Known Allergies  Labs:  Results for  orders placed or performed during the hospital encounter of 07/06/19 (from the past 48 hour(s))  CBC with Differential/Platelet     Status: Abnormal   Collection Time: 07/09/19  6:18 AM  Result Value Ref Range   WBC 11.9 (H) 4.0 - 10.5 K/uL   RBC 3.40 (L) 4.22 - 5.81 MIL/uL   Hemoglobin 10.5 (L) 13.0 - 17.0 g/dL   HCT 31.4 (L) 39.0 - 52.0 %   MCV 92.4 80.0 - 100.0 fL   MCH 30.9 26.0 - 34.0 pg   MCHC 33.4 30.0 - 36.0 g/dL   RDW 13.2 11.5 - 15.5 %   Platelets 287 150 - 400 K/uL   nRBC 0.0 0.0 - 0.2 %   Neutrophils Relative % 77 %   Neutro Abs 9.2 (H) 1.7 - 7.7 K/uL   Lymphocytes Relative 14 %   Lymphs Abs 1.7 0.7 - 4.0 K/uL   Monocytes Relative 7 %   Monocytes Absolute 0.8 0.1 - 1.0 K/uL   Eosinophils Relative 0 %   Eosinophils Absolute 0.0 0.0 - 0.5 K/uL   Basophils Relative 0 %   Basophils Absolute 0.0 0.0 - 0.1 K/uL   Immature Granulocytes 2 %   Abs Immature Granulocytes 0.22 (H) 0.00 - 0.07 K/uL    Comment: Performed at [facility], [address] [place], [place] [address]  Comprehensive metabolic panel     Status: Abnormal   Collection Time: 07/09/19  6:18 AM  Result Value Ref Range   Sodium 142 135 - 145 mmol/L   Potassium 4.2 3.5 - 5.1 mmol/L   Chloride 111 98 - 111 mmol/L   CO2 20 (L) 22 - 32 mmol/L   Glucose, Bld 129 (H) 70 - 99 mg/dL   BUN 40 (H) 8 - 23 mg/dL   Creatinine, Ser 1.53 (H) 0.61 - 1.24 mg/dL   Calcium 7.3 (L) 8.9 - 10.3 mg/dL   Total Protein 6.6 6.5 - 8.1 g/dL   Albumin 3.2 (L)  3.5 - 5.0 g/dL   AST 31 15 - 41 U/L   ALT 22 0 - 44 U/L   Alkaline Phosphatase 45 38 - 126 U/L   Total Bilirubin 0.6 0.3 - 1.2 mg/dL   GFR calc non Af Amer 42 (L) >60 mL/min   GFR calc Af Amer 49 (L) >60 mL/min   Anion gap 11 5 - 15    Comment: Performed at [facility], [address] [place], [place] [address]  C-reactive protein     Status: Abnormal   Collection Time: 07/09/19  6:18 AM  Result Value Ref Range   CRP 3.5 (H) <1.0 mg/dL    Comment: Performed at [facility], [place] [address], [place],  [address]  Fibrin derivatives D-Dimer [facility] only)     Status: Abnormal   Collection Time: 07/09/19  6:18 AM  Result Value Ref Range   Fibrin derivatives D-dimer ([facility]) 602.12 (H) 0.00 - 499.00 ng/mL (FEU)    Comment: (NOTE) <> Exclusion of Venous Thromboembolism (VTE) - OUTPATIENT ONLY   (Emergency Department or [place])   0-499 ng/ml (FEU): With a low to intermediate pretest probability                      for VTE this test result excludes the diagnosis                      of VTE.   >499 ng/ml (FEU) : VTE not  excluded; additional work up for VTE is                      required. <> Testing on Inpatients and Evaluation of Disseminated Intravascular   Coagulation (DIC) Reference Range:   0-499 ng/ml (FEU) Performed at [facility], [address] [place], [place] [address]   Ferritin     Status: Abnormal   Collection Time: 07/09/19  6:18 AM  Result Value Ref Range   Ferritin 1,383 (H) 24 - 336 ng/mL    Comment: Performed at [facility], [address] [place], [place] [address]  Magnesium     Status: Abnormal   Collection Time: 07/09/19  6:18 AM  Result Value Ref Range   Magnesium 2.5 (H) 1.7 - 2.4 mg/dL    Comment: Performed at [facility], [address] [place], [place] [address]  Phosphorus     Status: None   Collection Time: 07/09/19  6:18 AM  Result Value Ref Range   Phosphorus 2.8 2.5 - 4.6 mg/dL    Comment:  Performed at [facility], [address] [place], [place] [address]  Glucose, capillary     Status: Abnormal   Collection Time: 07/09/19  8:05 AM  Result Value Ref Range   Glucose-Capillary 119 (H) 70 - 99 mg/dL  CBC with Differential/Platelet     Status: Abnormal   Collection Time: 07/10/19  3:12 AM  Result Value Ref Range   WBC 10.9 (H) 4.0 - 10.5 K/uL   RBC 3.37 (L) 4.22 - 5.81 MIL/uL   Hemoglobin 10.5 (L) 13.0 - 17.0 g/dL   HCT 31.4 (L) 39.0 - 52.0 %   MCV 93.2 80.0 - 100.0 fL   MCH 31.2 26.0 - 34.0 pg   MCHC 33.4 30.0 - 36.0 g/dL   RDW 13.0 11.5 - 15.5 %   Platelets 333 150 - 400 K/uL   nRBC 0.0 0.0 - 0.2 %   Neutrophils Relative % 75 %   Neutro Abs 8.1 (H) 1.7 - 7.7 K/uL   Lymphocytes Relative 16 %   Lymphs Abs 1.8 0.7 - 4.0 K/uL   Monocytes Relative 7 %   Monocytes Absolute 0.7 0.1 - 1.0 K/uL   Eosinophils Relative 0 %   Eosinophils Absolute 0.0 0.0 - 0.5 K/uL   Basophils Relative 0 %   Basophils Absolute 0.0 0.0 - 0.1 K/uL   Immature Granulocytes 2 %   Abs Immature Granulocytes 0.21 (H) 0.00 - 0.07 K/uL    Comment: Performed at [facility], [address] [place], [place] [address]  C-reactive protein     Status: Abnormal   Collection Time: 07/10/19  3:12 AM  Result Value Ref Range   CRP 2.2 (H) <1.0 mg/dL    Comment: Performed at [facility], [address], [place], [place] [address]  Fibrin derivatives D-Dimer [facility] only)     Status: Abnormal   Collection Time: 07/10/19  6:04 AM  Result Value Ref Range   Fibrin derivatives D-dimer ([facility]) 533.03 (H) 0.00 - 499.00 ng/mL (FEU)    Comment: (NOTE) <> Exclusion of Venous Thromboembolism (VTE) - OUTPATIENT ONLY   (Emergency Department or [place])   0-499 ng/ml (FEU): With a low to intermediate pretest probability                      for VTE this test result excludes the diagnosis  of VTE.   >499 ng/ml (FEU) : VTE not excluded; additional work up for VTE is                       required. <> Testing on Inpatients and Evaluation of Disseminated Intravascular   Coagulation (DIC) Reference Range:   0-499 ng/ml (FEU) Performed at [facility], [address] [place], [place] [address]   Comprehensive metabolic panel     Status: Abnormal   Collection Time: 07/10/19  6:04 AM  Result Value Ref Range   Sodium 143 135 - 145 mmol/L   Potassium 4.2 3.5 - 5.1 mmol/L   Chloride 111 98 - 111 mmol/L   CO2 21 (L) 22 - 32 mmol/L   Glucose, Bld 107 (H) 70 - 99 mg/dL   BUN 43 (H) 8 - 23 mg/dL   Creatinine, Ser 1.54 (H) 0.61 - 1.24 mg/dL   Calcium 7.9 (L) 8.9 - 10.3 mg/dL   Total Protein 7.2 6.5 - 8.1 g/dL   Albumin 3.4 (L) 3.5 - 5.0 g/dL   AST 75 (H) 15 - 41 U/L   ALT 75 (H) 0 - 44 U/L   Alkaline Phosphatase 53 38 - 126 U/L   Total Bilirubin 0.9 0.3 - 1.2 mg/dL   GFR calc non Af Amer 42 (L) >60 mL/min   GFR calc Af Amer 49 (L) >60 mL/min   Anion gap 11 5 - 15    Comment: Performed at [facility], [address] [place], [place] [address]  Ferritin     Status: Abnormal   Collection Time: 07/10/19  6:04 AM  Result Value Ref Range   Ferritin 1,508 (H) 24 - 336 ng/mL    Comment: Performed at [facility], [address] [place], [place] [address]  Magnesium     Status: Abnormal   Collection Time: 07/10/19  6:04 AM  Result Value Ref Range   Magnesium 2.6 (H) 1.7 - 2.4 mg/dL    Comment: Performed at [facility], [address] [place], [place] [address]  Phosphorus     Status: None   Collection Time: 07/10/19  6:04 AM  Result Value Ref Range   Phosphorus 3.4 2.5 - 4.6 mg/dL    Comment: Performed at [facility], [address], [place], [place] [address]    Current Facility-Administered Medications  Medication Dose Route Frequency Provider Last Rate Last Admin  . amLODipine (NORVASC) tablet 10 mg  10 mg Oral Daily Lorella Nimrod, MD   10 mg at 07/10/19 0839  . ascorbic acid (VITAMIN C) tablet 500 mg  500  mg Oral Daily Lorella Nimrod, MD   500 mg at 07/10/19 0839  . aspirin EC tablet 81 mg  81 mg Oral Daily Lorella Nimrod, MD   81 mg at 07/10/19 0839  . atorvastatin (LIPITOR) tablet 20 mg  20 mg Oral Q2000 Lorella Nimrod, MD   20 mg at 07/09/19 2108  . chlorpheniramine-HYDROcodone (TUSSIONEX) 10-8 MG/5ML suspension 5 mL  5 mL Oral Q12H PRN Lorella Nimrod, MD      . clopidogrel (PLAVIX) tablet 75 mg  75 mg Oral Daily Lorella Nimrod, MD   75 mg at 07/10/19 0839  . dexamethasone (DECADRON) injection 6 mg  6 mg Intravenous Q24H Lorella Nimrod, MD   6 mg at 07/10/19 1132  . docusate sodium (COLACE) capsule 100 mg  100 mg Oral Daily PRN Lorella Nimrod, MD   100 mg at 07/10/19 1132  .  enoxaparin (LOVENOX) injection 40 mg  40 mg Subcutaneous Q24H Oswald Hillock, RPH   40 mg at 07/09/19 2110  . guaiFENesin (MUCINEX) 12 hr tablet 1,200 mg  1,200 mg Oral BID Enzo Bi, MD   1,200 mg at 07/10/19 [id number]  . hydrALAZINE (APRESOLINE) tablet 25 mg  25 mg Oral Q8H Enzo Bi, MD   25 mg at 07/10/19 1132  . Ipratropium-Albuterol (COMBIVENT) respimat 1 puff  1 puff Inhalation Q6H Lorella Nimrod, MD   1 puff at 07/10/19 1322  . multivitamin with minerals tablet 1 tablet  1 tablet Oral Daily Lorella Nimrod, MD   1 tablet at 07/10/19 0839  . OLANZapine (ZYPREXA) tablet 2.5 mg  2.5 mg Oral TID PRN Rulon Sera, MD      . ondansetron [facility]) tablet 4 mg  4 mg Oral Q6H PRN Lorella Nimrod, MD       Or  . ondansetron (ZOFRAN) injection 4 mg  4 mg Intravenous Q6H PRN Lorella Nimrod, MD   4 mg at 07/08/19 1143  . polyethylene glycol (MIRALAX / GLYCOLAX) packet 17 g  17 g Oral Daily PRN Lorella Nimrod, MD   17 g at 07/10/19 1132  . prednisoLONE acetate (PRED FORTE) 1 % ophthalmic suspension 1 drop  1 drop Both Eyes QID Lorella Nimrod, MD   1 drop at 07/10/19 1140  . vitamin B-12 (CYANOCOBALAMIN) tablet 500 mcg  500 mcg Oral Daily Lorella Nimrod, MD   500 mcg at 07/10/19 0839  . zinc sulfate capsule 220 mg  220 mg Oral Daily Lorella Nimrod, MD    220 mg at 07/10/19 [id number]    Musculoskeletal: Strength & Muscle Tone: within normal limits Gait & Station: normal Patient leans: Right  Psychiatric Specialty Exam: Physical Exam  Review of Systems  Blood pressure (!) 163/64, pulse 87, temperature 98.5 F (36.9 C), temperature source Oral, resp. rate (!) 26, height 5\' 4"  (1.626 m), weight 92.1 kg, SpO2 (!) 88 %.Body mass index is 34.85 kg/m.  General Appearance: Casual  Eye Contact:  Good  Speech:  Normal Rate  Volume:  Normal  Mood:  Euthymic  Affect:  Appropriate  Thought Process:  Coherent  Orientation:  Negative  Thought Content:  Negative  Suicidal Thoughts:  No  Homicidal Thoughts:  No  Memory:  NA  Judgement:  Fair  Insight:  Good  Psychomotor Activity:  Normal  Concentration:  Concentration: Good  Recall:  Good  Fund of Knowledge:  Good  Language:  Good  Akathisia:  NA  Handed:  Left  AIMS (if indicated):     Assets:  Communication Skills  ADL's:  Intact  Cognition:  WNL  Sleep:        Treatment Plan Summary: Well trial low-dose Zyprexa 2.5 mg TIDPRN hallucinations. Risks benefits s/e and alternatives reviewed.  May increase to 5 mg TID PRN if need  12/29 No changes Psychiatry will follow up in 24 to 48 hours   Disposition: No evidence of imminent risk to self or others at present.    Rulon Sera, MD 07/10/2019 2:48 PM

## 2019-07-11 DIAGNOSIS — J9601 Acute respiratory failure with hypoxia: Secondary | ICD-10-CM

## 2019-07-11 DIAGNOSIS — R443 Hallucinations, unspecified: Secondary | ICD-10-CM

## 2019-07-11 LAB — CBC WITH DIFFERENTIAL/PLATELET
Abs Immature Granulocytes: 0.48 10*3/uL — ABNORMAL HIGH (ref 0.00–0.07)
Basophils Absolute: 0 10*3/uL (ref 0.0–0.1)
Basophils Relative: 0 %
Eosinophils Absolute: 0 10*3/uL (ref 0.0–0.5)
Eosinophils Relative: 0 %
HCT: 33.5 % — ABNORMAL LOW (ref 39.0–52.0)
Hemoglobin: 11.3 g/dL — ABNORMAL LOW (ref 13.0–17.0)
Immature Granulocytes: 4 %
Lymphocytes Relative: 17 %
Lymphs Abs: 1.8 10*3/uL (ref 0.7–4.0)
MCH: 30.7 pg (ref 26.0–34.0)
MCHC: 33.7 g/dL (ref 30.0–36.0)
MCV: 91 fL (ref 80.0–100.0)
Monocytes Absolute: 0.6 10*3/uL (ref 0.1–1.0)
Monocytes Relative: 6 %
Neutro Abs: 7.9 10*3/uL — ABNORMAL HIGH (ref 1.7–7.7)
Neutrophils Relative %: 73 %
Platelets: 343 10*3/uL (ref 150–400)
RBC: 3.68 MIL/uL — ABNORMAL LOW (ref 4.22–5.81)
RDW: 12.7 % (ref 11.5–15.5)
WBC: 10.8 10*3/uL — ABNORMAL HIGH (ref 4.0–10.5)
nRBC: 0 % (ref 0.0–0.2)

## 2019-07-11 LAB — COMPREHENSIVE METABOLIC PANEL
ALT: 112 U/L — ABNORMAL HIGH (ref 0–44)
AST: 87 U/L — ABNORMAL HIGH (ref 15–41)
Albumin: 3.3 g/dL — ABNORMAL LOW (ref 3.5–5.0)
Alkaline Phosphatase: 58 U/L (ref 38–126)
Anion gap: 7 (ref 5–15)
BUN: 46 mg/dL — ABNORMAL HIGH (ref 8–23)
CO2: 25 mmol/L (ref 22–32)
Calcium: 7.8 mg/dL — ABNORMAL LOW (ref 8.9–10.3)
Chloride: 108 mmol/L (ref 98–111)
Creatinine, Ser: 1.56 mg/dL — ABNORMAL HIGH (ref 0.61–1.24)
GFR calc Af Amer: 48 mL/min — ABNORMAL LOW (ref >60)
GFR calc non Af Amer: 41 mL/min — ABNORMAL LOW (ref >60)
Glucose, Bld: 126 mg/dL — ABNORMAL HIGH (ref 70–99)
Potassium: 4.3 mmol/L (ref 3.5–5.1)
Sodium: 140 mmol/L (ref 135–145)
Total Bilirubin: 1 mg/dL (ref 0.3–1.2)
Total Protein: 6.8 g/dL (ref 6.5–8.1)

## 2019-07-11 LAB — FIBRIN DERIVATIVES D-DIMER (ARMC ONLY): Fibrin derivatives D-dimer (ARMC): 537.48 ng/mL (FEU) — ABNORMAL HIGH (ref 0.00–499.00)

## 2019-07-11 LAB — C-REACTIVE PROTEIN: CRP: 2 mg/dL — ABNORMAL HIGH (ref <1.0)

## 2019-07-11 LAB — FERRITIN: Ferritin: 1441 ng/mL — ABNORMAL HIGH (ref 24–336)

## 2019-07-11 LAB — PHOSPHORUS: Phosphorus: 3 mg/dL (ref 2.5–4.6)

## 2019-07-11 LAB — MAGNESIUM: Magnesium: 2.7 mg/dL — ABNORMAL HIGH (ref 1.7–2.4)

## 2019-07-11 MED ORDER — GUAIFENESIN ER 600 MG PO TB12: 1200.0000 mg | ORAL_TABLET | Freq: Two times a day (BID) | ORAL | Status: DC

## 2019-07-11 MED ORDER — ENSURE ENLIVE PO LIQD: 1.0000 | Freq: Three times a day (TID) | ORAL | 12 refills | Status: DC

## 2019-07-11 MED ORDER — DEXAMETHASONE SODIUM PHOSPHATE 10 MG/ML IJ SOLN: 6.0000 mg | INTRAMUSCULAR | 0 refills | Status: DC

## 2019-07-11 MED ORDER — ONDANSETRON HCL 4 MG PO TABS: 4.0000 mg | ORAL_TABLET | Freq: Four times a day (QID) | ORAL | 0 refills | Status: DC | PRN

## 2019-07-11 MED ORDER — OLANZAPINE 2.5 MG PO TABS: 2.5000 mg | ORAL_TABLET | Freq: Three times a day (TID) | ORAL | Status: DC | PRN

## 2019-07-11 MED ORDER — ZINC SULFATE 220 (50 ZN) MG PO CAPS: 220.0000 mg | ORAL_CAPSULE | Freq: Every day | ORAL | Status: DC

## 2019-07-11 MED ORDER — PROCHLORPERAZINE MALEATE 10 MG PO TABS: 10.0000 mg | ORAL_TABLET | Freq: Every day | ORAL | 0 refills | Status: DC | PRN

## 2019-07-11 MED ORDER — ONDANSETRON HCL 4 MG/2ML IJ SOLN: 4.0000 mg | Freq: Four times a day (QID) | INTRAMUSCULAR | 0 refills | Status: DC | PRN

## 2019-07-11 MED ORDER — ASCORBIC ACID 500 MG PO TABS: 500.0000 mg | ORAL_TABLET | Freq: Every day | ORAL | Status: DC

## 2019-07-11 MED ORDER — DIPHENHYDRAMINE HCL 25 MG PO CAPS: 25.0000 mg | ORAL_CAPSULE | Freq: Every day | ORAL | 0 refills | Status: DC | PRN

## 2019-07-11 MED ORDER — IPRATROPIUM-ALBUTEROL 20-100 MCG/ACT IN AERS: 1.0000 | INHALATION_SPRAY | Freq: Four times a day (QID) | RESPIRATORY_TRACT | Status: DC

## 2019-07-11 NOTE — Progress Notes (Signed)
PROGRESS NOTE    Craig Avery [facility]  [id number] DOB: [birth year] DOA: 07/06/2019 PCP: [name], MD   Brief Narrative:  Craig Avery [facility] a 78 y.o.malewith medical history significant ofhypertension, CKD stage III, peripheral vascular disease,CVA, trolled carotid stenosis and hyperlipidemia presented to ED with complaints of fever and generalized weakness.  07/11/2019: Completed 5 days of nebulizer.  Dexamethasone therapy.  Clinically does not appear to be responding.  Oxygen requirement has gone up substantially in the past 48 to 72 hours.  Patient initially required 2 to 3 L supplemental oxygen however is increasing oxygen needs and now currently on 8 to 10 L supplemental oxygen.  Metastatic improved.  He is able to speak in complete sentences.  He is not experiencing hallucinations.  Assessment & Plan:   Active Problems:   COVID-19  Acute hypoxic respiratory failure 2/2 Pneumonia due to COVID-19 virus. Positive for COVID-19 with elevated inflammatory markers and radiologic evidence of bilateral infiltrate.  Needed 2-3L O2 initially, however, went up to 5L on 12/28, then 8L today. -Repeat CXR showed "Slightly progressive hazy bilateral infiltrates consistent with COVID pneumonia." PLAN: -Complete 5-day course of remdesivir, will stop -Continue dexamethasone 6 mg IV daily, follow-up plan for 10-day course -Monitor inflammatory markers. -Vitamin C and zinc supplement. -wean o2 as able -Incentive spirometry and chest PT Craig Avery) -Given patient's continued need for oxygen by 5 days of remdesivir and steroid therapy he is an appropriate candidate for transfer to [facility].  Have called [facility] and requested a MedSurg bed.  Currently pending transfer.  -Patient was not pleased about the possibility of transferring to another hospital.  I explained that his care would be better served at a Covid specific facility.  I have also called and spoken to the  patient's family.  I spoke to the patient's son-in-law and explained the reason for transfer to which he agreed.   AKI with CKD stage III, resolved Creatinine at 2.48 with baseline at 2.  Cr back to baseline at 2.01 after IV hydration. --hold MIVF and encourage PO hydration  Hypertension.  -Continue home amlodipine. -resume home Zestoretic 12/29  Peripheral vascular disease. Hx of CVA -Continue home dose of aspirin and Plavix. -Continue statin.  Visual hallucination, resolved Anxiety and panic attack, improved --Visual hallucination only pt closes his eyes, never happened before.  -  Pt is very anxious about being in the hospital, per daughter. --IV haldol 5 mg x1 reportedly made him feel worse.  IV ativan didn't help.   - Migraine cocktail helped him to sleep. -This morning patient reports improvement in his anxiety and resolution of visual hallucinations PLAN: --Zyprexa 2.5 mg TID PRN hallucinations, per psych --benadryl+IV compazine PRN for calming and sleeping  DVT prophylaxis: Lovenox SQ Code Status: Full code  Family Communication: updated son-in-law on plans for transfer today 12/30 Disposition Plan:  Anticipate transfer to [facility]   Consultants:   Psychiatry  Procedures: None Antimicrobials: Remdesivir (07/06/19-07/11/19)   Subjective: Seen and examined No acute overnight events No new complaints Reports resolution of his hallucinations and improvement in anxiety Upset about possibility of transfer to another facility  Objective: Vitals:   07/10/19 2117 07/11/19 0548 07/11/19 0836 07/11/19 1230  BP: (!) 173/78 (!) 161/88 (!) 142/62 (!) 144/66  Pulse: (!) 102 87  91  Resp: 18 20  20   Temp: (!) 97.5 F (36.4 C) 98.2 F (36.8 C)  98.2 F (36.8 C)  TempSrc: Oral Oral  Oral  SpO2: 96% 96%  94%  Weight:      Height:        Intake/Output Summary (Last 24 hours) at 07/11/2019 1431 Last data filed at 07/11/2019 1000 Gross per 24  hour  Intake 118 ml  Output --  Net 118 ml   Filed Weights   07/06/19 1118  Weight: 92.1 kg    Examination:  Constitutional: NAD, AAOx3 HEENT: conjunctivae and lids normal, EOMI CV: RRR no M,R,G. Distal pulses +2.  No cyanosis.   RESP: coarse breath sounds, good air movement, normal respiratory effort, on 8L GI: +BS, NTND Extremities: No effusions, edema, or tenderness in BLE SKIN: warm, dry and intact Neuro: II - XII grossly intact.  Sensation intact Psych: better mood and affect.      Data Reviewed: I have personally reviewed following labs and imaging studies  CBC: Recent Labs  Lab 07/07/19 0509 07/08/19 0554 07/09/19 0618 07/10/19 0312 07/11/19 0309  WBC 7.4 10.0 11.9* 10.9* 10.8*  NEUTROABS 5.2 7.7 9.2* 8.1* 7.9*  HGB 10.6* 11.7* 10.5* 10.5* 11.3*  HCT 31.3* 34.8* 31.4* 31.4* 33.5*  MCV 92.3 92.1 92.4 93.2 91.0  PLT 200 267 287 333 [id number]   Basic Metabolic Panel: Recent Labs  Lab 07/07/19 0509 07/08/19 0554 07/09/19 0618 07/10/19 0604 07/11/19 0309  NA 141 142 142 143 140  K 4.6 4.4 4.2 4.2 4.3  CL 109 111 111 111 108  CO2 22 20* 20* 21* 25  GLUCOSE 136* 151* 129* 107* 126*  BUN 48* 42* 40* 43* 46*  CREATININE 2.01* 1.56* 1.53* 1.54* 1.56*  CALCIUM 7.3* 7.7* 7.3* 7.9* 7.8*  MG 2.6* 2.6* 2.5* 2.6* 2.7*  PHOS 2.8 1.9* 2.8 3.4 3.0   GFR: Estimated Creatinine Clearance: 38.7 mL/min (A) (by C-G formula based on SCr of 1.56 mg/dL (H)). Liver Function Tests: Recent Labs  Lab 07/07/19 0509 07/08/19 0554 07/09/19 0618 07/10/19 0604 07/11/19 0309  AST 22 25 31  75* 87*  ALT 23 24 22  75* 112*  ALKPHOS 46 52 45 53 58  BILITOT 0.4 0.7 0.6 0.9 1.0  PROT 6.8 7.6 6.6 7.2 6.8  ALBUMIN 3.2* 3.5 3.2* 3.4* 3.3*   No results for input(s): LIPASE, AMYLASE in the last 168 hours. No results for input(s): AMMONIA in the last 168 hours. Coagulation Profile: No results for input(s): INR, PROTIME in the last 168 hours. Cardiac Enzymes: No results for input(s):  CKTOTAL, CKMB, CKMBINDEX, TROPONINI in the last 168 hours. BNP (last 3 results) No results for input(s): PROBNP in the last 8760 hours. HbA1C: No results for input(s): HGBA1C in the last 72 hours. CBG: Recent Labs  Lab 07/09/19 0805  GLUCAP 119*   Lipid Profile: No results for input(s): CHOL, HDL, LDLCALC, TRIG, CHOLHDL, LDLDIRECT in the last 72 hours. Thyroid Function Tests: No results for input(s): TSH, T4TOTAL, FREET4, T3FREE, THYROIDAB in the last 72 hours. Anemia Panel: Recent Labs    07/10/19 0604 07/11/19 0309  FERRITIN 1,508* 1,441*   Sepsis Labs: Recent Labs  Lab 07/06/19 1133  PROCALCITON 0.14  LATICACIDVEN 1.5    No results found for this or any previous visit (from the past 240 hour(s)).       Radiology Studies: No results found.      Scheduled Meds: . amLODipine  10 mg Oral Daily  . vitamin C  500 mg Oral Daily  . aspirin EC  81 mg Oral Daily  . atorvastatin  20 mg Oral Q2000  . clopidogrel  75 mg Oral Daily  . dexamethasone (  DECADRON) injection  6 mg Intravenous Q24H  . enoxaparin (LOVENOX) injection  40 mg Subcutaneous Q24H  . feeding supplement (ENSURE ENLIVE)  1 Bottle Oral TID  . guaiFENesin  1,200 mg Oral BID  . lisinopril  20 mg Oral Daily   And  . hydrochlorothiazide  12.5 mg Oral Daily  . Ipratropium-Albuterol  1 puff Inhalation Q6H  . multivitamin with minerals  1 tablet Oral Daily  . prednisoLONE acetate  1 drop Both Eyes QID  . vitamin B-12  500 mcg Oral Daily  . zinc sulfate  220 mg Oral Daily   Continuous Infusions:   LOS: 5 days    Time spent: 35 minutes    Sidney Ace, MD [facility] Pager [phone]  If 7PM-7AM, please contact night-coverage [email or web address] Password [email or web address] 07/11/2019, 2:31 PM

## 2019-07-12 ENCOUNTER — Inpatient Hospital Stay (HOSPITAL_COMMUNITY)
Admission: AD | Admit: 2019-07-12 | Discharge: 2019-07-24 | DRG: 177 | Disposition: A | Discharge status: Home Health | Payer: Medicare Other | Source: Other Acute Inpatient Hospital | Attending: Internal Medicine | Admitting: Internal Medicine

## 2019-07-12 DIAGNOSIS — Z9842 Cataract extraction status, left eye: Secondary | ICD-10-CM

## 2019-07-12 DIAGNOSIS — Z9841 Cataract extraction status, right eye: Secondary | ICD-10-CM | POA: Diagnosis not present

## 2019-07-12 DIAGNOSIS — I739 Peripheral vascular disease, unspecified: Secondary | ICD-10-CM | POA: Diagnosis present

## 2019-07-12 DIAGNOSIS — Y95 Nosocomial condition: Secondary | ICD-10-CM | POA: Diagnosis not present

## 2019-07-12 DIAGNOSIS — I10 Essential (primary) hypertension: Secondary | ICD-10-CM | POA: Diagnosis present

## 2019-07-12 DIAGNOSIS — Z95820 Peripheral vascular angioplasty status with implants and grafts: Secondary | ICD-10-CM

## 2019-07-12 DIAGNOSIS — Z8249 Family history of ischemic heart disease and other diseases of the circulatory system: Secondary | ICD-10-CM

## 2019-07-12 DIAGNOSIS — Z8673 Personal history of transient ischemic attack (TIA), and cerebral infarction without residual deficits: Secondary | ICD-10-CM | POA: Diagnosis not present

## 2019-07-12 DIAGNOSIS — N179 Acute kidney failure, unspecified: Secondary | ICD-10-CM | POA: Diagnosis not present

## 2019-07-12 DIAGNOSIS — I129 Hypertensive chronic kidney disease with stage 1 through stage 4 chronic kidney disease, or unspecified chronic kidney disease: Secondary | ICD-10-CM | POA: Diagnosis present

## 2019-07-12 DIAGNOSIS — Z9981 Dependence on supplemental oxygen: Secondary | ICD-10-CM

## 2019-07-12 DIAGNOSIS — F419 Anxiety disorder, unspecified: Secondary | ICD-10-CM | POA: Diagnosis present

## 2019-07-12 DIAGNOSIS — J189 Pneumonia, unspecified organism: Secondary | ICD-10-CM | POA: Diagnosis not present

## 2019-07-12 DIAGNOSIS — D631 Anemia in chronic kidney disease: Secondary | ICD-10-CM | POA: Diagnosis present

## 2019-07-12 DIAGNOSIS — N1832 Chronic kidney disease, stage 3b: Secondary | ICD-10-CM | POA: Diagnosis not present

## 2019-07-12 DIAGNOSIS — Z87891 Personal history of nicotine dependence: Secondary | ICD-10-CM

## 2019-07-12 DIAGNOSIS — U071 COVID-19: Secondary | ICD-10-CM | POA: Diagnosis not present

## 2019-07-12 DIAGNOSIS — F329 Major depressive disorder, single episode, unspecified: Secondary | ICD-10-CM | POA: Diagnosis present

## 2019-07-12 DIAGNOSIS — R0902 Hypoxemia: Secondary | ICD-10-CM

## 2019-07-12 DIAGNOSIS — F410 Panic disorder [episodic paroxysmal anxiety] without agoraphobia: Secondary | ICD-10-CM | POA: Diagnosis present

## 2019-07-12 DIAGNOSIS — E785 Hyperlipidemia, unspecified: Secondary | ICD-10-CM | POA: Diagnosis present

## 2019-07-12 DIAGNOSIS — R630 Anorexia: Secondary | ICD-10-CM | POA: Diagnosis present

## 2019-07-12 DIAGNOSIS — Z7982 Long term (current) use of aspirin: Secondary | ICD-10-CM

## 2019-07-12 DIAGNOSIS — Z85820 Personal history of malignant melanoma of skin: Secondary | ICD-10-CM

## 2019-07-12 DIAGNOSIS — F32A Depression, unspecified: Secondary | ICD-10-CM | POA: Diagnosis present

## 2019-07-12 DIAGNOSIS — Z79899 Other long term (current) drug therapy: Secondary | ICD-10-CM

## 2019-07-12 DIAGNOSIS — R748 Abnormal levels of other serum enzymes: Secondary | ICD-10-CM

## 2019-07-12 DIAGNOSIS — J9601 Acute respiratory failure with hypoxia: Secondary | ICD-10-CM | POA: Diagnosis present

## 2019-07-12 DIAGNOSIS — R7989 Other specified abnormal findings of blood chemistry: Secondary | ICD-10-CM | POA: Diagnosis present

## 2019-07-12 DIAGNOSIS — J1282 Pneumonia due to coronavirus disease 2019: Secondary | ICD-10-CM | POA: Diagnosis present

## 2019-07-12 DIAGNOSIS — Z8349 Family history of other endocrine, nutritional and metabolic diseases: Secondary | ICD-10-CM

## 2019-07-12 DIAGNOSIS — Z8379 Family history of other diseases of the digestive system: Secondary | ICD-10-CM

## 2019-07-12 DIAGNOSIS — Z681 Body mass index (BMI) 19 or less, adult: Secondary | ICD-10-CM

## 2019-07-12 DIAGNOSIS — F320 Major depressive disorder, single episode, mild: Secondary | ICD-10-CM | POA: Diagnosis not present

## 2019-07-12 DIAGNOSIS — R441 Visual hallucinations: Secondary | ICD-10-CM | POA: Diagnosis present

## 2019-07-12 DIAGNOSIS — Z7902 Long term (current) use of antithrombotics/antiplatelets: Secondary | ICD-10-CM

## 2019-07-12 LAB — COMPREHENSIVE METABOLIC PANEL
ALT: 150 U/L — ABNORMAL HIGH (ref 0–44)
AST: 72 U/L — ABNORMAL HIGH (ref 15–41)
Albumin: 3.2 g/dL — ABNORMAL LOW (ref 3.5–5.0)
Alkaline Phosphatase: 54 U/L (ref 38–126)
Anion gap: 12 (ref 5–15)
BUN: 64 mg/dL — ABNORMAL HIGH (ref 8–23)
CO2: 23 mmol/L (ref 22–32)
Calcium: 8.6 mg/dL — ABNORMAL LOW (ref 8.9–10.3)
Chloride: 106 mmol/L (ref 98–111)
Creatinine, Ser: 1.79 mg/dL — ABNORMAL HIGH (ref 0.61–1.24)
GFR calc Af Amer: 41 mL/min — ABNORMAL LOW (ref >60)
GFR calc non Af Amer: 35 mL/min — ABNORMAL LOW (ref >60)
Glucose, Bld: 113 mg/dL — ABNORMAL HIGH (ref 70–99)
Potassium: 4.9 mmol/L (ref 3.5–5.1)
Sodium: 141 mmol/L (ref 135–145)
Total Bilirubin: 0.7 mg/dL (ref 0.3–1.2)
Total Protein: 6.5 g/dL (ref 6.5–8.1)

## 2019-07-12 LAB — CBC
HCT: 34.1 % — ABNORMAL LOW (ref 39.0–52.0)
Hemoglobin: 11.2 g/dL — ABNORMAL LOW (ref 13.0–17.0)
MCH: 30.6 pg (ref 26.0–34.0)
MCHC: 32.8 g/dL (ref 30.0–36.0)
MCV: 93.2 fL (ref 80.0–100.0)
Platelets: 417 10*3/uL — ABNORMAL HIGH (ref 150–400)
RBC: 3.66 MIL/uL — ABNORMAL LOW (ref 4.22–5.81)
RDW: 13 % (ref 11.5–15.5)
WBC: 14.3 10*3/uL — ABNORMAL HIGH (ref 4.0–10.5)
nRBC: 0 % (ref 0.0–0.2)

## 2019-07-12 LAB — CREATININE, SERUM
Creatinine, Ser: 1.86 mg/dL — ABNORMAL HIGH (ref 0.61–1.24)
GFR calc Af Amer: 39 mL/min — ABNORMAL LOW (ref >60)
GFR calc non Af Amer: 33 mL/min — ABNORMAL LOW (ref >60)

## 2019-07-12 LAB — D-DIMER, QUANTITATIVE: D-Dimer, Quant: 0.69 ug/mL-FEU — ABNORMAL HIGH (ref 0.00–0.50)

## 2019-07-12 LAB — ABO/RH: ABO/RH(D): A POS

## 2019-07-12 LAB — FERRITIN: Ferritin: 1239 ng/mL — ABNORMAL HIGH (ref 24–336)

## 2019-07-12 LAB — C-REACTIVE PROTEIN: CRP: 1 mg/dL — ABNORMAL HIGH (ref <1.0)

## 2019-07-12 LAB — PROCALCITONIN: Procalcitonin: <0.1 ng/mL

## 2019-07-12 MED ORDER — ASPIRIN 81 MG PO CHEW
81.0000 mg | CHEWABLE_TABLET | Freq: Every day | ORAL | Status: DC
  Administered 2019-07-13 – 2019-07-24 (×12): 81 mg via ORAL
  Filled 2019-07-12 (×12): qty 1

## 2019-07-12 MED ORDER — ASCORBIC ACID 500 MG PO TABS
500.0000 mg | ORAL_TABLET | Freq: Every day | ORAL | Status: DC
  Administered 2019-07-13 – 2019-07-24 (×12): 500 mg via ORAL
  Filled 2019-07-12 (×12): qty 1

## 2019-07-12 MED ORDER — ZINC SULFATE 220 (50 ZN) MG PO CAPS
220.0000 mg | ORAL_CAPSULE | Freq: Every day | ORAL | Status: DC
  Administered 2019-07-13 – 2019-07-24 (×12): 220 mg via ORAL
  Filled 2019-07-12 (×12): qty 1

## 2019-07-12 MED ORDER — PREDNISOLONE ACETATE 1 % OP SUSP
1.0000 [drp] | Freq: Four times a day (QID) | OPHTHALMIC | Status: DC
  Administered 2019-07-12 – 2019-07-24 (×49): 1 [drp] via OPHTHALMIC
  Filled 2019-07-12: qty 1

## 2019-07-12 MED ORDER — OLANZAPINE 2.5 MG PO TABS
2.5000 mg | ORAL_TABLET | Freq: Three times a day (TID) | ORAL | Status: DC | PRN
  Administered 2019-07-20: 11:00:00 2.5 mg via ORAL
  Filled 2019-07-12 (×2): qty 1

## 2019-07-12 MED ORDER — CLOPIDOGREL BISULFATE 75 MG PO TABS
75.0000 mg | ORAL_TABLET | Freq: Every day | ORAL | Status: DC
  Administered 2019-07-13 – 2019-07-24 (×12): 75 mg via ORAL
  Filled 2019-07-12 (×12): qty 1

## 2019-07-12 MED ORDER — POLYETHYLENE GLYCOL 3350 17 G PO PACK
17.0000 g | PACK | Freq: Every day | ORAL | Status: DC | PRN
  Administered 2019-07-15: 06:00:00 17 g via ORAL
  Filled 2019-07-12: qty 1

## 2019-07-12 MED ORDER — METHYLPREDNISOLONE SODIUM SUCC 125 MG IJ SOLR
0.5000 mg/kg | Freq: Two times a day (BID) | INTRAMUSCULAR | Status: AC
  Administered 2019-07-12 – 2019-07-15 (×7): 46.25 mg via INTRAVENOUS
  Filled 2019-07-12 (×7): qty 2

## 2019-07-12 MED ORDER — ATORVASTATIN CALCIUM 10 MG PO TABS
20.0000 mg | ORAL_TABLET | Freq: Every day | ORAL | Status: DC
  Administered 2019-07-12 – 2019-07-18 (×7): 20 mg via ORAL
  Filled 2019-07-12 (×7): qty 2

## 2019-07-12 MED ORDER — AMLODIPINE BESYLATE 10 MG PO TABS
10.0000 mg | ORAL_TABLET | Freq: Every day | ORAL | Status: DC
  Administered 2019-07-13 – 2019-07-24 (×12): 10 mg via ORAL
  Filled 2019-07-12 (×12): qty 1

## 2019-07-12 MED ORDER — ENOXAPARIN SODIUM 40 MG/0.4ML ~~LOC~~ SOLN
40.0000 mg | SUBCUTANEOUS | Status: DC
  Administered 2019-07-12 – 2019-07-23 (×12): 40 mg via SUBCUTANEOUS
  Filled 2019-07-12 (×12): qty 0.4

## 2019-07-12 NOTE — H&P (Signed)
History and Physical    Craig Avery [facility] [id number] DOB: 1941-02-25 DOA: 07/12/2019  PCP: [name], MD  Patient coming from: home  I have personally briefly reviewed patient's old medical records in [place]  Chief Complaint: shortness of breath  HPI: Craig Avery is Craig Avery 78 y.o. male with medical history significant of CVA, HTN, HLD, carotid stenosis s/p L CEA who presented to [facility] on 12/25 with fever and weakness.  He initially had nasal congestion and scratchy throat, then developed nausea and decreased appetite.  He presented to the ED after developing fevers, chills and generalized weakness.  He's been treated at [facility] with steroids and remdesivir, but due to increasing O2 requirement, he was transferred to [facility].  Hospitalization complicated by visual hallucinations and the patient was seen by psychiatry.      Review of Systems: As per HPI otherwise 10 point review of systems negative.    Past Medical History:  Diagnosis Date  . Carotid stenosis 01/2013   bilateral, s/p L CEA, plan rpt [place] qyear  . History of chicken pox   . HLD (hyperlipidemia)   . HTN (hypertension)   . 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 Obesity   . Stroke [facility]) 05/2012   R PCA territory, presented with L paresthesias    Past Surgical History:  Procedure Laterality Date  . CAROTID ENDARTERECTOMY Left 07-21-12   cea  . CATARACT EXTRACTION  2004   bilateral  . COLONOSCOPY WITH PROPOFOL N/Alanah Sakuma 06/24/2015   3 polyps, one with high grade dysplasia, rpt 3 mo; [name], MD  . COLONOSCOPY WITH PROPOFOL N/Bren Borys 10/14/2015   hyperplastic polyp; f/u PRN; [name], MD  . ENDARTERECTOMY  07/21/2012   Left CEA; Surgeon: [name], MD  . EYE SURGERY    . MELANOMA EXCISION  12/30/2016   right side of head above ear  . PATCH ANGIOPLASTY  07/21/2012   Procedure: PATCH ANGIOPLASTY;  Surgeon: [name], MD;   Location: [facility] OR;  Service: Vascular;  Laterality: Left;  Using 1 cm x 6 cm Vascu Guard patch   . TEE WITHOUT CARDIOVERSION  05/22/2012   Procedure: TRANSESOPHAGEAL ECHOCARDIOGRAM (TEE);  Surgeon: [name], MD;  Location: [facility] ENDOSCOPY;  Service: Cardiovascular;  Laterality: N/Jimesha Rising;  . WISDOM TOOTH EXTRACTION       reports that he quit smoking about 25 years ago. His smoking use included cigarettes. He has Rashee Marschall 49.50 pack-year smoking history. He has never used smokeless tobacco. He reports current alcohol use of about 4.0 - 5.0 standard drinks of alcohol per week. He reports that he does not use drugs.  No Known Allergies  Family History  Problem Relation Age of Onset  . CAD Brother 50       MI  . Heart disease Brother        before age 73  . Heart attack Brother   . Hypertension Mother   . Cirrhosis Father 74       EtOHic  . Aneurysm Paternal Uncle 25  . Heart disease Daughter   . Hyperlipidemia Daughter    Prior to Admission medications   Medication Sig Start Date End Date Taking? Authorizing Provider  amLODipine (NORVASC) 10 MG tablet TAKE 1 TABLET BY MOUTH EVERY DAY Patient taking differently: Take 10 mg by mouth daily.  07/03/19   [name], MD  ascorbic acid (VITAMIN C) 500 MG tablet  Take 1 tablet (500 mg total) by mouth daily. 07/12/19   Sidney Ace, MD  aspirin 81 MG tablet Take 81 mg by mouth daily.    [provider]  atorvastatin (LIPITOR) 20 MG tablet TAKE 1 TABLET BY MOUTH DAILY AT 6PM Patient taking differently: Take 20 mg by mouth daily at 6 PM. TAKE 1 TABLET BY MOUTH DAILY AT [facility] 04/23/19   Minna Merritts, MD  clopidogrel (PLAVIX) 75 MG tablet TAKE 1 TABLET BY MOUTH EVERY DAY Patient taking differently: Take 75 mg by mouth daily.  12/18/18   [name], MD  Cyanocobalamin (VITAMIN B 12 PO) Take by mouth daily.    [provider]  feeding supplement, ENSURE ENLIVE, (ENSURE ENLIVE) LIQD Take 237 mLs by mouth 3 (three) times  daily. 07/11/19   Sidney Ace, MD  guaiFENesin (MUCINEX) 600 MG 12 hr tablet Take 2 tablets (1,200 mg total) by mouth 2 (two) times daily. 07/11/19   Sreenath, Trula Slade, MD  Ipratropium-Albuterol (COMBIVENT) 20-100 MCG/ACT AERS respimat Inhale 1 puff into the lungs every 6 (six) hours. 07/11/19   Sidney Ace, MD  lisinopril-hydrochlorothiazide (ZESTORETIC) 20-12.5 MG tablet TAKE 1 TABLET BY MOUTH 2 (TWO) TIMES DAILY. 03/16/19   Minna Merritts, MD  Multiple Vitamins-Minerals (CENTRUM SILVER ADULT 50+ PO) Take 1 tablet by mouth daily.    [provider]  OLANZapine (ZYPREXA) 2.5 MG tablet Take 1 tablet (2.5 mg total) by mouth 3 (three) times daily as needed (hallucinations/anxiety). 07/11/19   Sidney Ace, MD  prednisoLONE acetate (PRED FORTE) 1 % ophthalmic suspension Place 1 drop into both eyes 4 (four) times daily. 07/04/19   [provider]  prochlorperazine (COMPAZINE) 10 MG tablet Take 1 tablet (10 mg total) by mouth daily as needed (sleep, to be given with benadryl). 07/11/19   Ralene Muskrat B, MD  zinc sulfate 220 (50 Zn) MG capsule Take 1 capsule (220 mg total) by mouth daily. 07/12/19   Sidney Ace, MD    Physical Exam: Vitals:   07/12/19 1015 07/12/19 1138 07/12/19 1246 07/12/19 1619  BP: (!) 133/43 (!) 131/55 (!) 131/55 (!) 133/59  Pulse: 72 79 79 96  Resp: (!) 23 20 20 18   Temp: 98 F (36.7 C) 98.1 F (36.7 C) 98.1 F (36.7 C) 98.1 F (36.7 C)  TempSrc: Oral Oral Oral Oral  SpO2: (!) 88% 91%  90%  Weight:   92.1 kg   Height:   5\' 4"  (1.626 m)     Constitutional: NAD, calm, comfortable Vitals:   07/12/19 1015 07/12/19 1138 07/12/19 1246 07/12/19 1619  BP: (!) 133/43 (!) 131/55 (!) 131/55 (!) 133/59  Pulse: 72 79 79 96  Resp: (!) 23 20 20 18   Temp: 98 F (36.7 C) 98.1 F (36.7 C) 98.1 F (36.7 C) 98.1 F (36.7 C)  TempSrc: Oral Oral Oral Oral  SpO2: (!) 88% 91%  90%  Weight:   92.1 kg   Height:   5\' 4"  (1.626  m)    Eyes: PERRL, lids and conjunctivae normal ENMT: Mucous membranes are moist. Posterior pharynx clear of any exudate or lesions.Normal dentition.  Neck: normal, supple, no masses, no thyromegaly Respiratory: clear to auscultation bilaterally, no wheezing, no crackles. Normal respiratory effort. No accessory muscle use.  Cardiovascular: Regular rate and rhythm, no murmurs / rubs / gallops. No extremity edema. Abdomen: no tenderness, no masses palpated. No hepatosplenomegaly. Bowel sounds positive.  Musculoskeletal: no clubbing / cyanosis. No joint deformity  upper and lower extremities. Good ROM, no contractures. Normal muscle tone.  Skin: no rashes, lesions, ulcers. No induration Neurologic: CN 2-12 grossly intact. Sensation intact. Moving all extremities.  Psychiatric: Normal judgment and insight. Alert and oriented x 3. Normal mood.   Labs on Admission: I have personally reviewed following labs and imaging studies  CBC: Recent Labs  Lab 07/07/19 0509 07/08/19 0554 07/09/19 0618 07/10/19 0312 07/11/19 0309 07/12/19 1315  WBC 7.4 10.0 11.9* 10.9* 10.8* 14.3*  NEUTROABS 5.2 7.7 9.2* 8.1* 7.9*  --   HGB 10.6* 11.7* 10.5* 10.5* 11.3* 11.2*  HCT 31.3* 34.8* 31.4* 31.4* 33.5* 34.1*  MCV 92.3 92.1 92.4 93.2 91.0 93.2  PLT 200 267 287 333 343 [id number]*   Basic Metabolic Panel: Recent Labs  Lab 07/07/19 0509 07/08/19 0554 07/09/19 0618 07/10/19 0604 07/11/19 0309 07/12/19 1315  NA 141 142 142 143 140  --   K 4.6 4.4 4.2 4.2 4.3  --   CL 109 111 111 111 108  --   CO2 22 20* 20* 21* 25  --   GLUCOSE 136* 151* 129* 107* 126*  --   BUN 48* 42* 40* 43* 46*  --   CREATININE 2.01* 1.56* 1.53* 1.54* 1.56* 1.86*  CALCIUM 7.3* 7.7* 7.3* 7.9* 7.8*  --   MG 2.6* 2.6* 2.5* 2.6* 2.7*  --   PHOS 2.8 1.9* 2.8 3.4 3.0  --    GFR: Estimated Creatinine Clearance: 32.4 mL/min (Sherma Vanmetre) (by C-G formula based on SCr of 1.86 mg/dL (H)). Liver Function Tests: Recent Labs  Lab 07/07/19 0509  07/08/19 0554 07/09/19 0618 07/10/19 0604 07/11/19 0309  AST 22 25 31  75* 87*  ALT 23 24 22  75* 112*  ALKPHOS 46 52 45 53 58  BILITOT 0.4 0.7 0.6 0.9 1.0  PROT 6.8 7.6 6.6 7.2 6.8  ALBUMIN 3.2* 3.5 3.2* 3.4* 3.3*   No results for input(s): LIPASE, AMYLASE in the last 168 hours. No results for input(s): AMMONIA in the last 168 hours. Coagulation Profile: No results for input(s): INR, PROTIME in the last 168 hours. Cardiac Enzymes: No results for input(s): CKTOTAL, CKMB, CKMBINDEX, TROPONINI in the last 168 hours. BNP (last 3 results) No results for input(s): PROBNP in the last 8760 hours. HbA1C: No results for input(s): HGBA1C in the last 72 hours. CBG: Recent Labs  Lab 07/09/19 0805  GLUCAP 119*   Lipid Profile: No results for input(s): CHOL, HDL, LDLCALC, TRIG, CHOLHDL, LDLDIRECT in the last 72 hours. Thyroid Function Tests: No results for input(s): TSH, T4TOTAL, FREET4, T3FREE, THYROIDAB in the last 72 hours. Anemia Panel: Recent Labs    07/10/19 0604 07/11/19 0309  FERRITIN 1,508* 1,441*   Urine analysis:    Component Value Date/Time   COLORURINE YELLOW 07/14/2012 0825   APPEARANCEUR CLEAR 07/14/2012 0825   LABSPEC 1.020 07/14/2012 0825   PHURINE 7.5 07/14/2012 0825   GLUCOSEU NEGATIVE 07/14/2012 0825   HGBUR NEGATIVE 07/14/2012 0825   BILIRUBINUR NEGATIVE 07/14/2012 0825   KETONESUR NEGATIVE 07/14/2012 0825   PROTEINUR NEGATIVE 07/14/2012 0825   UROBILINOGEN 0.2 07/14/2012 0825   NITRITE NEGATIVE 07/14/2012 0825   LEUKOCYTESUR NEGATIVE 07/14/2012 0825    Radiological Exams on Admission: No results found.  EKG: Independently reviewed. Normal sinus rhythm  Assessment/Plan Active Problems:   COVID-19 virus infection  Acute Hypoxic Respiratory Failure Secondary to COVID 19 Virus Infection:   Admitted 12/25 at [facility], O2 needs increased now to 8 L [place] S/p remdesivir.  Continue steroids. Consider actemra if  worsening O2 requirement I/O, daily  weights IS, OOB, prone as able Daily inflammatory labs  COVID-19 Labs  Recent Labs    07/10/19 0312 07/10/19 0604 07/11/19 0309  FERRITIN  --  1,508* 1,441*  CRP 2.2*  --  2.0*    No results found for: SARSCOV2NAA  AKI on CKD III: baseline creatinine was 2 in 05/2019.  Peaked at 2.48 on admission.  Now improved.  Follow.  Hypertension: continue amlodipine, hold HCTZ and lisinopril with renal function  PVD  Hx CVA: continue aspirin, plavix, statin  Visual Hallucination  Anxiety and Panic Attack: seen by psych at OSH who recommended low dose zyprexa as needed for hallucinations Delirium precautions  Elevated LFT's: likely 2/2 COVID infection, follow  Continue home prednisolone acetate eye drops  DVT prophylaxis: lovenox  Code Status: full  Family Communication: daughter  Disposition Plan: pending further improvement  Consults called: none  Admission status: inpatient    Fayrene Helper MD [facility] Pager AMION  If 7PM-7AM, please contact night-coverage [email or web address] Password [email or web address]  07/12/2019, 4:23 PM

## 2019-07-12 NOTE — Plan of Care (Signed)
Patient AOX4, no c/o pain . Safety measures in place.

## 2019-07-12 NOTE — Progress Notes (Signed)
Gillian Shields (daughter) updated about patients current status and oxygen requirement.

## 2019-07-12 NOTE — Discharge Summary (Signed)
Physician Discharge Summary  Sidy Curtner [facility] [id number] DOB: 1941/05/21 DOA: 07/06/2019  PCP: [name], MD  Admit date: 07/06/2019 Discharge date: 07/12/2019  Admitted From: Home Disposition: [facility]  Recommendations for Outpatient Follow-up:  1. Follow up with PCP in 1-2 weeks 2.   Home Health: None Equipment/Devices: None  Discharge Condition: Guarded CODE STATUS: Full Diet recommendation: Heart Healthy  Brief/Interim Summary:  Kullen Halverson [facility] a 78 y.o.malewith medical history significant ofhypertension, CKD stage III, peripheral vascular disease,CVA, trolled carotid stenosis and hyperlipidemia presented to ED with complaints of fever and generalized weakness.  Completed 5 days of remdesevir/Dexamethasone therapy.  Clinically does not appear to be responding.  Oxygen requirement has gone up substantially in the past 48 to 72 hours.  Patient initially required 2 to 3 L supplemental oxygen however is increasing oxygen needs and now currently on 8 to 10 L supplemental oxygen.    Mental status at baseline.  Patient was experiencing some visual hallucinations.  Psychiatry consult called.  Appreciate medication recommendations.  Hallucinations resolved.  Discharge Diagnoses:  Active Problems:   COVID-19  Acute hypoxic respiratory failure 2/2 Pneumonia due to COVID-19 virus. Positive for COVID-19 with elevated inflammatory markers and radiologic evidence of bilateral infiltrate. Needed 2-3L O2 initially, however, went up to 5L on 12/28, then 8L today. -RepeatCXR showed "Slightly progressive hazy bilateral infiltrates consistent with COVID pneumonia." PLAN: -Complete 5-day course of remdesivir -Continue dexamethasone 6 mg IV daily, follow-up plan for 10-day course -Monitor inflammatory markers. -Vitamin C and zinc supplement. -wean o2 as able -Incentive spirometry and chest PT Docia Chuck) -Given patient's continued need for oxygen by 5 days of  remdesivir and steroid therapy he is an appropriate candidate for transfer to [facility].  Have called [facility] and requested a MedSurg bed.  -Patient was not pleased about the possibility of transferring to another hospital.  I explained that his care would be better served at a Covid specific facility.  I have also called and spoken to the patient's family.  I spoke to the patient's son-in-law and explained the reason for transfer to which he agreed. -Patient successfully transferred to [facility] on 07/12/2019.  Saw and examined prior to discharge.  Patient stable on 8 L of oxygen.  Able to speak in complete sentences.  Answers all questions appropriately.  AKI with CKD stage III, resolved Creatinine at 2.48 with baseline at 2. Cr back to baseline at 2.01 after IV hydration. --hold MIVF and encourage PO hydration  Hypertension.  -Continuehomeamlodipine. -resumehome Zestoretic12/29  Peripheral vascular disease. Hx of CVA -Continue home dose of aspirin and Plavix. -Continue statin.  Visual hallucination, resolved Anxiety and panic attack, improved --Visual hallucination only pt closes his eyes, never happened before.  - Pt is very anxious about being in the hospital, per daughter. --IV haldol 5 mg x1 reportedly made him feel worse. IV ativan didn't help.  - Migraine cocktail helped him to sleep. -This morning patient reports improvement in his anxiety and resolution of visual hallucinations PLAN: --Zyprexa 2.5 mg TID PRN hallucinations, per psych --benadryl+IV compazine PRN for calming and sleeping  Discharge Instructions  Discharge Instructions    MyChart COVID-19 home monitoring program   Complete by: Jul 11, 2019    Is the patient willing to use the [place] for home monitoring?: Yes   Temperature monitoring   Complete by: Jul 11, 2019    After how many days would you like to receive a notification of this patient's flowsheet  entries?: 1   Diet - low sodium heart healthy   Complete by: As directed    Increase activity slowly   Complete by: As directed      Allergies as of 07/12/2019   No Known Allergies     Medication List    TAKE these medications   amLODipine 10 MG tablet Commonly known as: NORVASC TAKE 1 TABLET BY MOUTH EVERY DAY   ascorbic acid 500 MG tablet Commonly known as: VITAMIN C Take 1 tablet (500 mg total) by mouth daily.   aspirin 81 MG tablet Take 81 mg by mouth daily.   atorvastatin 20 MG tablet Commonly known as: LIPITOR TAKE 1 TABLET BY MOUTH DAILY AT 6PM What changed:   how much to take  how to take this  when to take this   CENTRUM SILVER ADULT 50+ PO Take 1 tablet by mouth daily.   clopidogrel 75 MG tablet Commonly known as: PLAVIX TAKE 1 TABLET BY MOUTH EVERY DAY   feeding supplement (ENSURE ENLIVE) Liqd Take 237 mLs by mouth 3 (three) times daily.   guaiFENesin 600 MG 12 hr tablet Commonly known as: MUCINEX Take 2 tablets (1,200 mg total) by mouth 2 (two) times daily.   Ipratropium-Albuterol 20-100 MCG/ACT Aers respimat Commonly known as: COMBIVENT Inhale 1 puff into the lungs every 6 (six) hours.   lisinopril-hydrochlorothiazide 20-12.5 MG tablet Commonly known as: ZESTORETIC TAKE 1 TABLET BY MOUTH 2 (TWO) TIMES DAILY.   OLANZapine 2.5 MG tablet Commonly known as: ZYPREXA Take 1 tablet (2.5 mg total) by mouth 3 (three) times daily as needed (hallucinations/anxiety).   prednisoLONE acetate 1 % ophthalmic suspension Commonly known as: PRED FORTE Place 1 drop into both eyes 4 (four) times daily.   prochlorperazine 10 MG tablet Commonly known as: COMPAZINE Take 1 tablet (10 mg total) by mouth daily as needed (sleep, to be given with benadryl).   VITAMIN B 12 PO Take by mouth daily.   zinc sulfate 220 (50 Zn) MG capsule Take 1 capsule (220 mg total) by mouth daily.       No Known  Allergies  Consultations:  none   Procedures/Studies: DG Chest 2 View  Result Date: 07/06/2019 CLINICAL DATA:  Cough and fever.  Shortness of breath and fatigue. EXAM: CHEST - 2 VIEW COMPARISON:  07/21/2012 FINDINGS: Patchy areas of subtle airspace opacity are seen in the lungs bilaterally with a slight lower lung predominance. No pleural effusion. The cardiopericardial silhouette is within normal limits for size. The visualized bony structures of the thorax are intact. IMPRESSION: Patchy areas of subtle ground-glass opacity in both lungs without associated pleural effusion. Imaging features compatible with multifocal pneumonia and viral/atypical etiology is a consideration. Electronically Signed   By: Misty Stanley M.D.   On: 07/06/2019 12:07   DG Chest Port 1 View  Result Date: 07/09/2019 CLINICAL DATA:  COVID pneumonia. EXAM: PORTABLE CHEST 1 VIEW COMPARISON:  Chest x-ray 07/06/2011 FINDINGS: The cardiac silhouette, mediastinal and hilar contours are within normal limits and stable. Persistent patchy hazy bilateral infiltrates consistent with COVID pneumonia. Findings are slightly progressive. No pleural effusions. The bony thorax is intact. IMPRESSION: Slightly progressive hazy bilateral infiltrates consistent with COVID pneumonia. Electronically Signed   By: Marijo Sanes M.D.   On: 07/09/2019 08:27       Subjective: Seen and examined on the day of discharge No acute status changes over interval No new complaints  Discharge Exam: Vitals:   07/12/19 0810 07/12/19 0850  BP: Marland Kitchen)  139/57 (!) 151/60  Pulse:  84  Resp:  (!) 26  Temp:  98.2 F (36.8 C)  SpO2:  91%   Vitals:   07/11/19 2123 07/12/19 0656 07/12/19 0810 07/12/19 0850  BP: (!) 169/77 (!) 138/56 (!) 139/57 (!) 151/60  Pulse: 97 62  84  Resp: 20 20  (!) 26  Temp: 98.3 F (36.8 C) 98.4 F (36.9 C)  98.2 F (36.8 C)  TempSrc: Oral Oral  Oral  SpO2: 91% 95%  91%  Weight:      Height:        General: Pt is  alert, awake, not in acute distress Cardiovascular: RRR, S1/S2 +, no rubs, no gallops Respiratory: Scattered crackles bilaterally.  No use of accessory muscles Abdominal: Soft, NT, ND, bowel sounds + Extremities: no edema, no cyanosis    The results of significant diagnostics from this hospitalization (including imaging, microbiology, ancillary and laboratory) are listed below for reference.     Microbiology: No results found for this or any previous visit (from the past 240 hour(s)).   Labs: BNP (last 3 results) Recent Labs    07/06/19 1133  BNP 0000000   Basic Metabolic Panel: Recent Labs  Lab 07/07/19 0509 07/08/19 0554 07/09/19 0618 07/10/19 0604 07/11/19 0309  NA 141 142 142 143 140  K 4.6 4.4 4.2 4.2 4.3  CL 109 111 111 111 108  CO2 22 20* 20* 21* 25  GLUCOSE 136* 151* 129* 107* 126*  BUN 48* 42* 40* 43* 46*  CREATININE 2.01* 1.56* 1.53* 1.54* 1.56*  CALCIUM 7.3* 7.7* 7.3* 7.9* 7.8*  MG 2.6* 2.6* 2.5* 2.6* 2.7*  PHOS 2.8 1.9* 2.8 3.4 3.0   Liver Function Tests: Recent Labs  Lab 07/07/19 0509 07/08/19 0554 07/09/19 0618 07/10/19 0604 07/11/19 0309  AST 22 25 31  75* 87*  ALT 23 24 22  75* 112*  ALKPHOS 46 52 45 53 58  BILITOT 0.4 0.7 0.6 0.9 1.0  PROT 6.8 7.6 6.6 7.2 6.8  ALBUMIN 3.2* 3.5 3.2* 3.4* 3.3*   No results for input(s): LIPASE, AMYLASE in the last 168 hours. No results for input(s): AMMONIA in the last 168 hours. CBC: Recent Labs  Lab 07/07/19 0509 07/08/19 0554 07/09/19 0618 07/10/19 0312 07/11/19 0309  WBC 7.4 10.0 11.9* 10.9* 10.8*  NEUTROABS 5.2 7.7 9.2* 8.1* 7.9*  HGB 10.6* 11.7* 10.5* 10.5* 11.3*  HCT 31.3* 34.8* 31.4* 31.4* 33.5*  MCV 92.3 92.1 92.4 93.2 91.0  PLT 200 267 287 333 343   Cardiac Enzymes: No results for input(s): CKTOTAL, CKMB, CKMBINDEX, TROPONINI in the last 168 hours. BNP: Invalid input(s): POCBNP CBG: Recent Labs  Lab 07/09/19 0805  GLUCAP 119*   D-Dimer No results for input(s): DDIMER in the last 72  hours. Hgb A1c No results for input(s): HGBA1C in the last 72 hours. Lipid Profile No results for input(s): CHOL, HDL, LDLCALC, TRIG, CHOLHDL, LDLDIRECT in the last 72 hours. Thyroid function studies No results for input(s): TSH, T4TOTAL, T3FREE, THYROIDAB in the last 72 hours.  Invalid input(s): FREET3 Anemia work up Recent Labs    07/10/19 0604 07/11/19 0309  FERRITIN 1,508* 1,441*   Urinalysis    Component Value Date/Time   COLORURINE YELLOW 07/14/2012 0825   APPEARANCEUR CLEAR 07/14/2012 0825   LABSPEC 1.020 07/14/2012 0825   PHURINE 7.5 07/14/2012 0825   GLUCOSEU NEGATIVE 07/14/2012 0825   HGBUR NEGATIVE 07/14/2012 0825   BILIRUBINUR NEGATIVE 07/14/2012 0825   KETONESUR NEGATIVE 07/14/2012 0825   PROTEINUR NEGATIVE  07/14/2012 0825   UROBILINOGEN 0.2 07/14/2012 0825   NITRITE NEGATIVE 07/14/2012 0825   LEUKOCYTESUR NEGATIVE 07/14/2012 0825   Sepsis Labs Invalid input(s): PROCALCITONIN,  WBC,  LACTICIDVEN Microbiology No results found for this or any previous visit (from the past 240 hour(s)).   Time coordinating discharge: Over 30 minutes  SIGNED:   Sidney Ace, MD  [facility] 07/12/2019, 12:28 PM Pager [phone]  If 7PM-7AM, please contact night-coverage [email or web address] Password [email or web address]

## 2019-07-13 ENCOUNTER — Inpatient Hospital Stay (HOSPITAL_COMMUNITY): Payer: Medicare Other

## 2019-07-13 ENCOUNTER — Encounter (HOSPITAL_COMMUNITY): Payer: Self-pay

## 2019-07-13 ENCOUNTER — Other Ambulatory Visit: Payer: Self-pay

## 2019-07-13 LAB — COMPREHENSIVE METABOLIC PANEL
ALT: 161 U/L — ABNORMAL HIGH (ref 0–44)
AST: 79 U/L — ABNORMAL HIGH (ref 15–41)
Albumin: 3 g/dL — ABNORMAL LOW (ref 3.5–5.0)
Alkaline Phosphatase: 57 U/L (ref 38–126)
Anion gap: 12 (ref 5–15)
BUN: 70 mg/dL — ABNORMAL HIGH (ref 8–23)
CO2: 23 mmol/L (ref 22–32)
Calcium: 8.7 mg/dL — ABNORMAL LOW (ref 8.9–10.3)
Chloride: 106 mmol/L (ref 98–111)
Creatinine, Ser: 1.78 mg/dL — ABNORMAL HIGH (ref 0.61–1.24)
GFR calc Af Amer: 41 mL/min — ABNORMAL LOW (ref >60)
GFR calc non Af Amer: 35 mL/min — ABNORMAL LOW (ref >60)
Glucose, Bld: 139 mg/dL — ABNORMAL HIGH (ref 70–99)
Potassium: 5.1 mmol/L (ref 3.5–5.1)
Sodium: 141 mmol/L (ref 135–145)
Total Bilirubin: 0.6 mg/dL (ref 0.3–1.2)
Total Protein: 6.4 g/dL — ABNORMAL LOW (ref 6.5–8.1)

## 2019-07-13 LAB — CBC WITH DIFFERENTIAL/PLATELET
Abs Immature Granulocytes: 0.66 10*3/uL — ABNORMAL HIGH (ref 0.00–0.07)
Basophils Absolute: 0.1 10*3/uL (ref 0.0–0.1)
Basophils Relative: 0 %
Eosinophils Absolute: 0 10*3/uL (ref 0.0–0.5)
Eosinophils Relative: 0 %
HCT: 32.8 % — ABNORMAL LOW (ref 39.0–52.0)
Hemoglobin: 10.8 g/dL — ABNORMAL LOW (ref 13.0–17.0)
Immature Granulocytes: 5 %
Lymphocytes Relative: 11 %
Lymphs Abs: 1.4 10*3/uL (ref 0.7–4.0)
MCH: 30.9 pg (ref 26.0–34.0)
MCHC: 32.9 g/dL (ref 30.0–36.0)
MCV: 94 fL (ref 80.0–100.0)
Monocytes Absolute: 0.4 10*3/uL (ref 0.1–1.0)
Monocytes Relative: 3 %
Neutro Abs: 10.6 10*3/uL — ABNORMAL HIGH (ref 1.7–7.7)
Neutrophils Relative %: 81 %
Platelets: 387 10*3/uL (ref 150–400)
RBC: 3.49 MIL/uL — ABNORMAL LOW (ref 4.22–5.81)
RDW: 13 % (ref 11.5–15.5)
WBC: 13.1 10*3/uL — ABNORMAL HIGH (ref 4.0–10.5)
nRBC: 0 % (ref 0.0–0.2)

## 2019-07-13 LAB — FERRITIN: Ferritin: 1226 ng/mL — ABNORMAL HIGH (ref 24–336)

## 2019-07-13 LAB — C-REACTIVE PROTEIN: CRP: 1.2 mg/dL — ABNORMAL HIGH (ref <1.0)

## 2019-07-13 LAB — D-DIMER, QUANTITATIVE: D-Dimer, Quant: 0.55 ug/mL-FEU — ABNORMAL HIGH (ref 0.00–0.50)

## 2019-07-13 LAB — HEPATITIS PANEL, ACUTE
HCV Ab: NONREACTIVE
Hep A IgM: NONREACTIVE
Hep B C IgM: NONREACTIVE
Hepatitis B Surface Ag: NONREACTIVE

## 2019-07-13 LAB — PHOSPHORUS: Phosphorus: 4.8 mg/dL — ABNORMAL HIGH (ref 2.5–4.6)

## 2019-07-13 LAB — MAGNESIUM: Magnesium: 2.6 mg/dL — ABNORMAL HIGH (ref 1.7–2.4)

## 2019-07-13 NOTE — Progress Notes (Addendum)
PROGRESS NOTE    Tillie Veldkamp [facility]  [id number] DOB: December 23, 1940 DOA: 07/12/2019 PCP: [name], MD   Brief Narrative:  Craig Avery is Craig Avery 79 y.o. male with medical history significant of CVA, HTN, HLD, carotid stenosis s/p L CEA who presented to [facility] on 12/25 with fever and weakness.  He initially had nasal congestion and scratchy throat, then developed nausea and decreased appetite.  He presented to the ED after developing fevers, chills and generalized weakness.  He's been treated at [facility] with steroids and remdesivir, but due to increasing O2 requirement, he was transferred to [facility]  Hospitalization complicated by visual hallucinations and the patient was seen by psychiatry.      Assessment & Plan:   Active Problems:   COVID-19 virus infection  Acute Hypoxic Respiratory Failure Secondary to COVID 19 Virus Infection:   Admitted 12/25 at [facility] Slightly improved to 6 L today S/p remdesivir.  Continue steroids. Consider actemra if worsening O2 requirement I/O, daily weights IS, OOB, prone as able Daily inflammatory labs  COVID-19 Labs  Recent Labs    07/11/19 0309 07/12/19 1315 07/13/19 0020  DDIMER  --  0.69* 0.55*  FERRITIN 1,441* 1,239* 1,226*  CRP 2.0* 1.0* 1.2*    No results found for: SARSCOV2NAA  AKI on CKD III: baseline creatinine was 2 in 05/2019.  Peaked at 2.48 on admission.  Now improved.  Follow.  Hypertension: continue amlodipine, hold HCTZ and lisinopril with renal function  PVD  Hx CVA: continue aspirin, plavix, statin  Visual Hallucination  Anxiety and Panic Attack: seen by psych at OSH who recommended low dose zyprexa as needed for hallucinations Delirium precautions  Elevated LFT's: likely 2/2 COVID infection, follow.  Acute hepatitis panel pending.  Continue home prednisolone acetate eye drops  DVT prophylaxis: lovenox Code Status: full Family Communication: none at bedside - discussed with daughter Disposition Plan:  pending further improvemetn   Consultants:   none  Procedures:   none  Antimicrobials: Anti-infectives (From admission, onward)   None     Subjective: In good spirits, feeling Craig Avery bit better  Objective: Vitals:   07/13/19 0920 07/13/19 1122 07/13/19 1347 07/13/19 1545  BP: (!) 149/75 126/65  (!) 147/67  Pulse: 83 96 96 93  Resp: (!) 22 (!) 22 13 (!) 21  Temp:  98 F (36.7 C)  98.4 F (36.9 C)  TempSrc:  Oral  Oral  SpO2: (!) 88% (!) 89% 90% 90%  Weight:      Height:        Intake/Output Summary (Last 24 hours) at 07/13/2019 1645 Last data filed at 07/13/2019 0400 Gross per 24 hour  Intake 180 ml  Output 400 ml  Net -220 ml   Filed Weights   07/12/19 1246  Weight: 92.1 kg    Examination:  General exam: Appears calm and comfortable  Respiratory system: Clear to auscultation. Respiratory effort normal. Cardiovascular system: rRR Gastrointestinal system: Abdomen is nondistended, soft and nontender. . Central nervous system: Alert and oriented. No focal neurological deficits. Extremities: no LEE Skin: No rashes, lesions or ulcers Psychiatry: Judgement and insight appear normal. Mood & affect appropriate.     Data Reviewed: I have personally reviewed following labs and imaging studies  CBC: Recent Labs  Lab 07/08/19 0554 07/09/19 0618 07/10/19 0312 07/11/19 0309 07/12/19 1315 07/13/19 0020  WBC 10.0 11.9* 10.9* 10.8* 14.3* 13.1*  NEUTROABS 7.7 9.2* 8.1* 7.9*  --  10.6*  HGB 11.7* 10.5* 10.5* 11.3* 11.2* 10.8*  HCT  34.8* 31.4* 31.4* 33.5* 34.1* 32.8*  MCV 92.1 92.4 93.2 91.0 93.2 94.0  PLT 267 287 333 343 417* [id number]   Basic Metabolic Panel: Recent Labs  Lab 07/08/19 0554 07/09/19 0618 07/10/19 0604 07/11/19 0309 07/12/19 1315 07/13/19 0020  NA 142 142 143 140 141 141  K 4.4 4.2 4.2 4.3 4.9 5.1  CL 111 111 111 108 106 106  CO2 20* 20* 21* 25 23 23   GLUCOSE 151* 129* 107* 126* 113* 139*  BUN 42* 40* 43* 46* 64* 70*  CREATININE 1.56* 1.53*  1.54* 1.56* 1.79*  1.86* 1.78*  CALCIUM 7.7* 7.3* 7.9* 7.8* 8.6* 8.7*  MG 2.6* 2.5* 2.6* 2.7*  --  2.6*  PHOS 1.9* 2.8 3.4 3.0  --  4.8*   GFR: Estimated Creatinine Clearance: 33.9 mL/min (Mort Smelser) (by C-G formula based on SCr of 1.78 mg/dL (H)). Liver Function Tests: Recent Labs  Lab 07/09/19 0618 07/10/19 0604 07/11/19 0309 07/12/19 1315 07/13/19 0020  AST 31 75* 87* 72* 79*  ALT 22 75* 112* 150* 161*  ALKPHOS 45 53 58 54 57  BILITOT 0.6 0.9 1.0 0.7 0.6  PROT 6.6 7.2 6.8 6.5 6.4*  ALBUMIN 3.2* 3.4* 3.3* 3.2* 3.0*   No results for input(s): LIPASE, AMYLASE in the last 168 hours. No results for input(s): AMMONIA in the last 168 hours. Coagulation Profile: No results for input(s): INR, PROTIME in the last 168 hours. Cardiac Enzymes: No results for input(s): CKTOTAL, CKMB, CKMBINDEX, TROPONINI in the last 168 hours. BNP (last 3 results) No results for input(s): PROBNP in the last 8760 hours. HbA1C: No results for input(s): HGBA1C in the last 72 hours. CBG: Recent Labs  Lab 07/09/19 0805  GLUCAP 119*   Lipid Profile: No results for input(s): CHOL, HDL, LDLCALC, TRIG, CHOLHDL, LDLDIRECT in the last 72 hours. Thyroid Function Tests: No results for input(s): TSH, T4TOTAL, FREET4, T3FREE, THYROIDAB in the last 72 hours. Anemia Panel: Recent Labs    07/12/19 1315 07/13/19 0020  FERRITIN 1,239* 1,226*   Sepsis Labs: Recent Labs  Lab 07/12/19 1315  PROCALCITON <0.10    No results found for this or any previous visit (from the past 240 hour(s)).       Radiology Studies: DG CHEST PORT 1 VIEW  Result Date: 07/13/2019 CLINICAL DATA:  Hypoxia. EXAM: PORTABLE CHEST 1 VIEW COMPARISON:  07/09/2019 FINDINGS: Heart size is mildly enlarged. Right midlung and diffuse left lung pulmonary opacities are unchanged. No pleural effusion identified. IMPRESSION: Unchanged appearance of bilateral pulmonary opacities. Electronically Signed   By: Kerby Moors M.D.   On: 07/13/2019 10:41          Scheduled Meds: . amLODipine  10 mg Oral Daily  . ascorbic acid  500 mg Oral Daily  . aspirin  81 mg Oral Daily  . atorvastatin  20 mg Oral q1800  . clopidogrel  75 mg Oral Daily  . enoxaparin (LOVENOX) injection  40 mg Subcutaneous Q24H  . methylPREDNISolone (SOLU-MEDROL) injection  0.5 mg/kg Intravenous Q12H  . prednisoLONE acetate  1 drop Both Eyes QID  . zinc sulfate  220 mg Oral Daily   Continuous Infusions:   LOS: 1 day    Time spent: over 30 min    Fayrene Helper, MD [facility] Pager AMION  If 7PM-7AM, please contact night-coverage [email or web address] Password [facility] 07/13/2019, 4:45 PM

## 2019-07-14 ENCOUNTER — Encounter (HOSPITAL_COMMUNITY): Payer: Self-pay | Admitting: Family Medicine

## 2019-07-14 ENCOUNTER — Other Ambulatory Visit: Payer: Self-pay

## 2019-07-14 LAB — C-REACTIVE PROTEIN: CRP: 0.7 mg/dL (ref <1.0)

## 2019-07-14 LAB — CBC WITH DIFFERENTIAL/PLATELET
Abs Immature Granulocytes: 0.74 10*3/uL — ABNORMAL HIGH (ref 0.00–0.07)
Basophils Absolute: 0 10*3/uL (ref 0.0–0.1)
Basophils Relative: 0 %
Eosinophils Absolute: 0 10*3/uL (ref 0.0–0.5)
Eosinophils Relative: 0 %
HCT: 31.4 % — ABNORMAL LOW (ref 39.0–52.0)
Hemoglobin: 10.5 g/dL — ABNORMAL LOW (ref 13.0–17.0)
Immature Granulocytes: 5 %
Lymphocytes Relative: 12 %
Lymphs Abs: 1.6 10*3/uL (ref 0.7–4.0)
MCH: 31.2 pg (ref 26.0–34.0)
MCHC: 33.4 g/dL (ref 30.0–36.0)
MCV: 93.2 fL (ref 80.0–100.0)
Monocytes Absolute: 0.5 10*3/uL (ref 0.1–1.0)
Monocytes Relative: 3 %
Neutro Abs: 11.4 10*3/uL — ABNORMAL HIGH (ref 1.7–7.7)
Neutrophils Relative %: 80 %
Platelets: 400 10*3/uL (ref 150–400)
RBC: 3.37 MIL/uL — ABNORMAL LOW (ref 4.22–5.81)
RDW: 12.7 % (ref 11.5–15.5)
WBC: 14.2 10*3/uL — ABNORMAL HIGH (ref 4.0–10.5)
nRBC: 0 % (ref 0.0–0.2)

## 2019-07-14 LAB — COMPREHENSIVE METABOLIC PANEL
ALT: 153 U/L — ABNORMAL HIGH (ref 0–44)
AST: 53 U/L — ABNORMAL HIGH (ref 15–41)
Albumin: 3 g/dL — ABNORMAL LOW (ref 3.5–5.0)
Alkaline Phosphatase: 52 U/L (ref 38–126)
Anion gap: 13 (ref 5–15)
BUN: 80 mg/dL — ABNORMAL HIGH (ref 8–23)
CO2: 22 mmol/L (ref 22–32)
Calcium: 8.3 mg/dL — ABNORMAL LOW (ref 8.9–10.3)
Chloride: 105 mmol/L (ref 98–111)
Creatinine, Ser: 1.84 mg/dL — ABNORMAL HIGH (ref 0.61–1.24)
GFR calc Af Amer: 39 mL/min — ABNORMAL LOW (ref >60)
GFR calc non Af Amer: 34 mL/min — ABNORMAL LOW (ref >60)
Glucose, Bld: 155 mg/dL — ABNORMAL HIGH (ref 70–99)
Potassium: 4.5 mmol/L (ref 3.5–5.1)
Sodium: 140 mmol/L (ref 135–145)
Total Bilirubin: 0.9 mg/dL (ref 0.3–1.2)
Total Protein: 6.1 g/dL — ABNORMAL LOW (ref 6.5–8.1)

## 2019-07-14 LAB — PHOSPHORUS: Phosphorus: 5.8 mg/dL — ABNORMAL HIGH (ref 2.5–4.6)

## 2019-07-14 LAB — D-DIMER, QUANTITATIVE: D-Dimer, Quant: 0.53 ug/mL-FEU — ABNORMAL HIGH (ref 0.00–0.50)

## 2019-07-14 LAB — FERRITIN: Ferritin: 1170 ng/mL — ABNORMAL HIGH (ref 24–336)

## 2019-07-14 LAB — MAGNESIUM: Magnesium: 2.6 mg/dL — ABNORMAL HIGH (ref 1.7–2.4)

## 2019-07-14 NOTE — Progress Notes (Signed)
Patients daughter Collie Siad) updated.

## 2019-07-14 NOTE — Progress Notes (Addendum)
PROGRESS NOTE    Craig Avery  [id number] DOB: Jul 14, 1940 DOA: 07/12/2019 PCP: [name], MD   Brief Narrative:  Craig Avery is Craig Avery 79 y.o. male with medical history significant of CVA, HTN, HLD, carotid stenosis s/p L CEA who presented to [facility] on 12/25 with fever and weakness.  He initially had nasal congestion and scratchy throat, then developed nausea and decreased appetite.  He presented to the ED after developing fevers, chills and generalized weakness.  He's been treated at [facility] with steroids and remdesivir, but due to increasing O2 requirement, he was transferred to [facility].  Hospitalization complicated by visual hallucinations and the patient was seen by psychiatry.      Assessment & Plan:   Active Problems:   COVID-19 virus infection  Acute Hypoxic Respiratory Failure Secondary to COVID 19 Virus Infection:   Admitted 12/25 at [name] improved to 4 L today. S/p remdesivir.  Continue steroids. Consider actemra if worsening O2 requirement I/O, daily weights IS, OOB, prone as able Daily inflammatory labs  COVID-19 Labs  Recent Labs    07/12/19 1315 07/13/19 0020 07/14/19 0353  DDIMER 0.69* 0.55* 0.53*  FERRITIN 1,239* 1,226* 1,170*  CRP 1.0* 1.2* 0.7    No results found for: SARSCOV2NAA  AKI on CKD III: baseline creatinine was 2 in 05/2019.  Peaked at 2.48 on admission.  Now improved.  Follow.  Hypertension: continue amlodipine, hold HCTZ and lisinopril with renal function  PVD  Hx CVA: continue aspirin, plavix, statin  Visual Hallucination  Anxiety and Panic Attack: seen by psych at OSH who recommended low dose zyprexa as needed for hallucinations Delirium precautions  Elevated LFT's: likely 2/2 COVID infection, follow.  Acute hepatitis panel negative.  Continue home prednisolone acetate eye drops  DVT prophylaxis: lovenox Code Status: full Family Communication: none at bedside - discussed with daughter 1/2 Disposition  Plan: pending further improvemetn   Consultants:   none  Procedures:   none  Antimicrobials: Anti-infectives (From admission, onward)   None     Subjective: Continues to be in good spirits Doing ok with SOB No new complaints  Objective: Vitals:   07/14/19 0653 07/14/19 0727 07/14/19 0948 07/14/19 1200  BP:  (!) 156/73 (!) 165/76 (!) 148/63  Pulse:  81  76  Resp:  20  19  Temp:  98.1 F (36.7 C)  97.8 F (36.6 C)  TempSrc:  Oral  Oral  SpO2: 91% 92%  92%  Weight:      Height:        Intake/Output Summary (Last 24 hours) at 07/14/2019 1502 Last data filed at 07/14/2019 0600 Gross per 24 hour  Intake 240 ml  Output 1100 ml  Net -860 ml   Filed Weights   07/12/19 1246  Weight: 92.1 kg    Examination:  General: No acute distress. Cardiovascular: RRR Lungs: Clear to auscultation bilaterally  Abdomen: Soft, nontender, nondistended Neurological: Alert and oriented 3. Moves all extremities 4 . Cranial nerves II through XII grossly intact. Skin: Warm and dry. No rashes or lesions. Extremities: No clubbing or cyanosis. No edema.   Data Reviewed: I have personally reviewed following labs and imaging studies  CBC: Recent Labs  Lab 07/09/19 0618 07/10/19 0312 07/11/19 0309 07/12/19 1315 07/13/19 0020 07/14/19 0353  WBC 11.9* 10.9* 10.8* 14.3* 13.1* 14.2*  NEUTROABS 9.2* 8.1* 7.9*  --  10.6* 11.4*  HGB 10.5* 10.5* 11.3* 11.2* 10.8* 10.5*  HCT 31.4* 31.4* 33.5* 34.1* 32.8* 31.4*  MCV 92.4  93.2 91.0 93.2 94.0 93.2  PLT 287 333 343 417* 387 [id number]   Basic Metabolic Panel: Recent Labs  Lab 07/09/19 0618 07/10/19 0604 07/11/19 0309 07/12/19 1315 07/13/19 0020 07/14/19 0353  NA 142 143 140 141 141 140  K 4.2 4.2 4.3 4.9 5.1 4.5  CL 111 111 108 106 106 105  CO2 20* 21* 25 23 23 22   GLUCOSE 129* 107* 126* 113* 139* 155*  BUN 40* 43* 46* 64* 70* 80*  CREATININE 1.53* 1.54* 1.56* 1.79*  1.86* 1.78* 1.84*  CALCIUM 7.3* 7.9* 7.8* 8.6* 8.7* 8.3*  MG 2.5*  2.6* 2.7*  --  2.6* 2.6*  PHOS 2.8 3.4 3.0  --  4.8* 5.8*   GFR: Estimated Creatinine Clearance: 32.8 mL/min (Craig Avery) (by C-G formula based on SCr of 1.84 mg/dL (H)). Liver Function Tests: Recent Labs  Lab 07/10/19 0604 07/11/19 0309 07/12/19 1315 07/13/19 0020 07/14/19 0353  AST 75* 87* 72* 79* 53*  ALT 75* 112* 150* 161* 153*  ALKPHOS 53 58 54 57 52  BILITOT 0.9 1.0 0.7 0.6 0.9  PROT 7.2 6.8 6.5 6.4* 6.1*  ALBUMIN 3.4* 3.3* 3.2* 3.0* 3.0*   No results for input(s): LIPASE, AMYLASE in the last 168 hours. No results for input(s): AMMONIA in the last 168 hours. Coagulation Profile: No results for input(s): INR, PROTIME in the last 168 hours. Cardiac Enzymes: No results for input(s): CKTOTAL, CKMB, CKMBINDEX, TROPONINI in the last 168 hours. BNP (last 3 results) No results for input(s): PROBNP in the last 8760 hours. HbA1C: No results for input(s): HGBA1C in the last 72 hours. CBG: Recent Labs  Lab 07/09/19 0805  GLUCAP 119*   Lipid Profile: No results for input(s): CHOL, HDL, LDLCALC, TRIG, CHOLHDL, LDLDIRECT in the last 72 hours. Thyroid Function Tests: No results for input(s): TSH, T4TOTAL, FREET4, T3FREE, THYROIDAB in the last 72 hours. Anemia Panel: Recent Labs    07/13/19 0020 07/14/19 0353  FERRITIN 1,226* 1,170*   Sepsis Labs: Recent Labs  Lab 07/12/19 1315  PROCALCITON <0.10    No results found for this or any previous visit (from the past 240 hour(s)).       Radiology Studies: DG CHEST PORT 1 VIEW  Result Date: 07/13/2019 CLINICAL DATA:  Hypoxia. EXAM: PORTABLE CHEST 1 VIEW COMPARISON:  07/09/2019 FINDINGS: Heart size is mildly enlarged. Right midlung and diffuse left lung pulmonary opacities are unchanged. No pleural effusion identified. IMPRESSION: Unchanged appearance of bilateral pulmonary opacities. Electronically Signed   By: Kerby Moors M.D.   On: 07/13/2019 10:41        Scheduled Meds: . amLODipine  10 mg Oral Daily  . ascorbic acid   500 mg Oral Daily  . aspirin  81 mg Oral Daily  . atorvastatin  20 mg Oral q1800  . clopidogrel  75 mg Oral Daily  . enoxaparin (LOVENOX) injection  40 mg Subcutaneous Q24H  . methylPREDNISolone (SOLU-MEDROL) injection  0.5 mg/kg Intravenous Q12H  . prednisoLONE acetate  1 drop Both Eyes QID  . zinc sulfate  220 mg Oral Daily   Continuous Infusions:   LOS: 2 days    Time spent: over 30 min    Fayrene Helper, MD [facility] Pager AMION  If 7PM-7AM, please contact night-coverage [email or web address] Password [facility] 07/14/2019, 3:02 PM

## 2019-07-14 NOTE — Evaluation (Signed)
Physical Therapy Evaluation Patient Details Name: Craig Avery MRN: [id number] DOB: [birth year] Today's Date: 07/14/2019   History of Present Illness  79 y.o. male with medical history significant of CVA, HTN, HLD, carotid stenosis s/p L CEA who presented to [facility] on 12/25 with fever and weakness.  He initially had nasal congestion and scratchy throat, then developed nausea and decreased appetite.  He presented to the ED after developing fevers, chills and generalized weakness.  He's been treated at [facility] with steroids and remdesivir, but due to increasing O2 requirement, he was transferred to [facility].  Hospitalization complicated by visual hallucinations and the patient was seen by psychiatry.      Clinical Impression   Pt admitted with above diagnosis. PTA pt was living home and was very independent, he states he cared for his spouse until her passing a few years ago. Pt is very pleasant and quite emotional this am, he is also very talkative which at times can interfere with breathing. Pt currently with functional limitations due to the deficits listed below (see PT Problem List). This am pt is moving well and functionally is needing stand by assist with bed mob and transfers and min guard assist with ambulation in room approx 69ft. He was initially on 4L/min via  [place] and sitting in 90s at rest but with completion of supine to sit desat into low 80s, took increased time to recover to high 80s and needed bump to 6L/min, ambulated on 6L/min and did desat to min 79% again took increased time (>2mins) to recover to high 80s.   Pt will benefit from skilled PT to increase his overall activity tolerance, independence and safety with mobility to allow discharge to the venue listed below.       Follow Up Recommendations Home health PT    Equipment Recommendations  None recommended by PT    Recommendations for Other Services OT consult     Precautions / Restrictions Precautions Precautions:  Fall;Other (comment) Precaution Comments: desats with mobility Restrictions Weight Bearing Restrictions: No      Mobility  Bed Mobility Overal bed mobility: Needs Assistance Bed Mobility: Supine to Sit     Supine to sit: Supervision        Transfers Overall transfer level: Needs assistance Equipment used: None Transfers: Sit to/from Stand;Stand Pivot Transfers Sit to Stand: Min guard;Supervision Stand pivot transfers: Min guard;Supervision          Ambulation/Gait Ambulation/Gait assistance: Min guard Gait Distance (Feet): 80 Feet Assistive device: None Gait Pattern/deviations: Step-through pattern Gait velocity: decreased cadence   General Gait Details: ambulated on 6L/min via [place] and desat to min of 79% took >5 mins to recover to high 80s.  Stairs            Wheelchair Mobility    Modified Rankin (Stroke Patients Only)       Balance                                             Pertinent Vitals/Pain Pain Assessment: No/denies pain    Home Living Family/patient expects to be discharged to:: Private residence Living Arrangements: Alone Available Help at Discharge: Family Type of Home: [place]: One [place]: Environmental consultant - 2 wheels;Bedside commode;Wheelchair - manual      Prior Function Level of Independence: Independent  Hand Dominance        Extremity/Trunk Assessment   Upper Extremity Assessment Upper Extremity Assessment: Generalized weakness    Lower Extremity Assessment Lower Extremity Assessment: Generalized weakness       Communication   Communication: No difficulties  Cognition Arousal/Alertness: Awake/alert Behavior During Therapy: WFL for tasks assessed/performed Overall Cognitive Status: Within Functional Limits for tasks assessed                                        General Comments General comments (skin  integrity, edema, etc.): Pt i svery talkertive and at times this causes him to desaturate, he needs cues for pursed lip breathing and also to use flutter valve.    Exercises Other Exercises Other Exercises: reinforced use of incentive spirometer Other Exercises: reinforced use of flutter valve   Assessment/Plan    PT Assessment Patient needs continued PT services  PT Problem List Decreased strength;Decreased activity tolerance;Decreased balance;Decreased mobility;Decreased coordination;Decreased safety awareness       PT Treatment Interventions Gait training;Functional mobility training;Therapeutic activities;Therapeutic exercise;Balance training;Neuromuscular re-education;Patient/family education    PT Goals (Current goals can be found in the Care Plan section)  Acute Rehab PT Goals Patient Stated Goal: to get better PT Goal Formulation: With patient Time For Goal Achievement: 07/28/19 Potential to Achieve Goals: Good    Frequency Min 3X/week   Barriers to discharge Other (comment) lives alone    Co-evaluation               AM-PAC PT "6 Clicks" Mobility  Outcome Measure Help needed turning from your back to your side while in a flat bed without using bedrails?: None Help needed moving from lying on your back to sitting on the side of a flat bed without using bedrails?: None Help needed moving to and from a bed to a chair (including a wheelchair)?: A Little Help needed standing up from a chair using your arms (e.g., wheelchair or bedside chair)?: A Little Help needed to walk in hospital room?: A Little Help needed climbing 3-5 steps with a railing? : A Lot 6 Click Score: 19    End of Session Equipment Utilized During Treatment: Oxygen Activity Tolerance: Treatment limited secondary to medical complications (Comment) Patient left: in chair;with call bell/phone within reach Nurse Communication: Mobility status PT Visit Diagnosis: Other abnormalities of gait and  mobility (R26.89);Muscle weakness (generalized) (M62.81)    Time: [id number] PT Time Calculation (min) (ACUTE ONLY): 28 min   Charges:   PT Evaluation $PT Eval Moderate Complexity: 1 Mod PT Treatments $Gait Training: 8-22 mins        Horald Chestnut, PT   Delford Field 07/14/2019, 12:31 PM

## 2019-07-15 LAB — COMPREHENSIVE METABOLIC PANEL
ALT: 215 U/L — ABNORMAL HIGH (ref 0–44)
AST: 79 U/L — ABNORMAL HIGH (ref 15–41)
Albumin: 2.9 g/dL — ABNORMAL LOW (ref 3.5–5.0)
Alkaline Phosphatase: 53 U/L (ref 38–126)
Anion gap: 12 (ref 5–15)
BUN: 85 mg/dL — ABNORMAL HIGH (ref 8–23)
CO2: 23 mmol/L (ref 22–32)
Calcium: 8.3 mg/dL — ABNORMAL LOW (ref 8.9–10.3)
Chloride: 105 mmol/L (ref 98–111)
Creatinine, Ser: 1.76 mg/dL — ABNORMAL HIGH (ref 0.61–1.24)
GFR calc Af Amer: 41 mL/min — ABNORMAL LOW (ref >60)
GFR calc non Af Amer: 36 mL/min — ABNORMAL LOW (ref >60)
Glucose, Bld: 141 mg/dL — ABNORMAL HIGH (ref 70–99)
Potassium: 4.5 mmol/L (ref 3.5–5.1)
Sodium: 140 mmol/L (ref 135–145)
Total Bilirubin: 0.6 mg/dL (ref 0.3–1.2)
Total Protein: 6.2 g/dL — ABNORMAL LOW (ref 6.5–8.1)

## 2019-07-15 LAB — CBC WITH DIFFERENTIAL/PLATELET
Abs Immature Granulocytes: 0.65 10*3/uL — ABNORMAL HIGH (ref 0.00–0.07)
Basophils Absolute: 0 10*3/uL (ref 0.0–0.1)
Basophils Relative: 0 %
Eosinophils Absolute: 0 10*3/uL (ref 0.0–0.5)
Eosinophils Relative: 0 %
HCT: 32.9 % — ABNORMAL LOW (ref 39.0–52.0)
Hemoglobin: 10.9 g/dL — ABNORMAL LOW (ref 13.0–17.0)
Immature Granulocytes: 4 %
Lymphocytes Relative: 10 %
Lymphs Abs: 1.4 10*3/uL (ref 0.7–4.0)
MCH: 31 pg (ref 26.0–34.0)
MCHC: 33.1 g/dL (ref 30.0–36.0)
MCV: 93.5 fL (ref 80.0–100.0)
Monocytes Absolute: 0.5 10*3/uL (ref 0.1–1.0)
Monocytes Relative: 3 %
Neutro Abs: 12.3 10*3/uL — ABNORMAL HIGH (ref 1.7–7.7)
Neutrophils Relative %: 83 %
Platelets: 395 10*3/uL (ref 150–400)
RBC: 3.52 MIL/uL — ABNORMAL LOW (ref 4.22–5.81)
RDW: 12.9 % (ref 11.5–15.5)
WBC: 14.9 10*3/uL — ABNORMAL HIGH (ref 4.0–10.5)
nRBC: 0 % (ref 0.0–0.2)

## 2019-07-15 LAB — C-REACTIVE PROTEIN: CRP: 0.7 mg/dL (ref <1.0)

## 2019-07-15 LAB — D-DIMER, QUANTITATIVE: D-Dimer, Quant: 0.44 ug/mL-FEU (ref 0.00–0.50)

## 2019-07-15 LAB — PHOSPHORUS: Phosphorus: 5.2 mg/dL — ABNORMAL HIGH (ref 2.5–4.6)

## 2019-07-15 LAB — FERRITIN: Ferritin: 1372 ng/mL — ABNORMAL HIGH (ref 24–336)

## 2019-07-15 LAB — MAGNESIUM: Magnesium: 2.7 mg/dL — ABNORMAL HIGH (ref 1.7–2.4)

## 2019-07-15 MED ORDER — ENSURE ENLIVE PO LIQD
237.0000 mL | Freq: Three times a day (TID) | ORAL | Status: DC
  Administered 2019-07-15 – 2019-07-24 (×26): 237 mL via ORAL

## 2019-07-15 NOTE — Progress Notes (Signed)
Patients daughter Collie Siad) updated.

## 2019-07-15 NOTE — Progress Notes (Signed)
Initial Nutrition Assessment RD working remotely.  DOCUMENTATION CODES:   Obesity unspecified  INTERVENTION:  Discussed recommendation for NGT placement for tube feeds with MD via secure chat. Plan is to hold off for now as patient may be able to discharge in the next 24-48 hours. Next date of Cortrak service is Wednesday 1/6.  Provide Ensure Enlive po TID, each supplement provides 350 kcal and 20 grams of protein.   If patient remains admitted and NGT or Cortrak tube is placed for tube feeds, recommend: -Initiating Osmolite 1.5 Cal at 20 mL/hr and advancing by 20 ml/hr every 8 hours to goal rate of 60 mL/hr -Pro-Stat 30 mL BID per tube -Provides 2360 kcal, 120 grams of protein, 1094 mL H2O daily -If tube feeds are initiated patient will be at risk for refeeding syndrome. Recommend monitoring potassium, phosphorus, and magnesium daily and replacing as needed.  NUTRITION DIAGNOSIS:   Increased nutrient needs related to catabolic [id number]) as evidenced by estimated needs.  GOAL:   Patient will meet greater than or equal to 90% of their needs  MONITOR:   PO intake, Supplement acceptance, Labs, Weight trends, I & O's  REASON FOR ASSESSMENT:   Consult Assessment of nutrition requirement/status  ASSESSMENT:   79 year old male with PMHx of HTN, HLD, hx CVA admitted 12/25 to [facility] with COVID-19 and transferred to [facility] on 12/31, also with AKI on CKD stage III, visual hallucinations.   Patient has had poor PO intake this admission. He is eating 0-25% of his meals. Even at [facility] prior to transfer to [facility] he had poor PO intake per review of meal documentation. Recommend placement of NGT for initiation of enteral nutrition. Plan is to hold off at this time as patient may be able to discharge in the next 24-48 hours. If patient is unable to discharge plan will be for placement of NGT later this week. Will also order ONS to help patient meet calorie/protein  needs.  Patient appears weight-stable. He is currently 92.1 kg (203.04 lbs) if current weight is accurate (may have been copied forward from previous admissions).  Medications reviewed and include: vitamin C 500 mg daily, Solu-Medrol 0.5 mg/kg every 12 hrs IV, zinc sulfate 220 mg daily.  Labs reviewed: BUN 85, Creatinine 1.76, Phosphorus 5.2.  NUTRITION - FOCUSED PHYSICAL EXAM:  Unable to complete at this time.  Diet Order:   Diet Order            Diet regular Room service appropriate? Yes; Fluid consistency: Thin  Diet effective now             EDUCATION NEEDS:   No education needs have been identified at this time  Skin:  Skin Assessment: Reviewed RN Assessment  Last BM:  07/09/2019  Height:   Ht Readings from Last 1 Encounters:  07/12/19 5\' 4"  (1.626 m)   Weight:   Wt Readings from Last 1 Encounters:  07/12/19 92.1 kg   Ideal Body Weight:  59.1 kg  BMI:  Body mass index is 34.85 kg/m.  Estimated Nutritional Needs:   Kcal:  2100-2300  Protein:  110-120 grams  Fluid:  2-2.3 L/day  Jacklynn Barnacle, MS, RD, LDN Office: [phone] Pager: [phone] After Hours/Weekend Pager: [phone]

## 2019-07-15 NOTE — Plan of Care (Signed)
  Problem: Education: Goal: Knowledge of risk factors and measures for prevention of condition will improve Outcome: Progressing   Problem: Coping: Goal: Psychosocial and spiritual needs will be supported Outcome: Progressing   Problem: Respiratory: Goal: Will maintain a patent airway Outcome: Progressing Goal: Complications related to the disease process, condition or treatment will be avoided or minimized Outcome: Progressing   

## 2019-07-15 NOTE — Progress Notes (Addendum)
PROGRESS NOTE    Craig Avery [facility]  [id number] DOB: April 15, 1941 DOA: 07/12/2019 PCP: [name], MD   Brief Narrative:  Craig Avery is Craig Avery 79 y.o. male with medical history significant of CVA, HTN, HLD, carotid stenosis s/p L CEA who presented to [facility] on 12/25 with fever and weakness.  He initially had nasal congestion and scratchy throat, then developed nausea and decreased appetite.  He presented to the ED after developing fevers, chills and generalized weakness.  He's been treated at [facility] with steroids and remdesivir, but due to increasing O2 requirement, he was transferred to [facility].  Hospitalization complicated by visual hallucinations and the patient was seen by psychiatry.      Assessment & Plan:   Active Problems:   COVID-19 virus infection  Acute Hypoxic Respiratory Failure Secondary to COVID 19 Virus Infection:   Admitted 12/25 at [facility] Slightly improved to 2 L today. S/p remdesivir.  Continue steroids. Consider actemra if worsening O2 requirement I/O, daily weights IS, OOB, prone as able Daily inflammatory labs  COVID-19 Labs  Recent Labs    07/13/19 0020 07/14/19 0353 07/15/19 0416  DDIMER 0.55* 0.53* 0.44  FERRITIN 1,226* 1,170* 1,372*  CRP 1.2* 0.7 0.7    No results found for: SARSCOV2NAA  AKI on CKD III: baseline creatinine was 2 in 05/2019.  Peaked at 2.48 on admission.  Now improved.  Follow.  Hypertension: continue amlodipine, hold HCTZ and lisinopril with renal function  PVD  Hx CVA: continue aspirin, plavix, statin  Visual Hallucination  Anxiety and Panic Attack: seen by psych at OSH who recommended low dose zyprexa as needed for hallucinations Delirium precautions  Elevated LFT's: likely 2/2 COVID infection, follow.  Acute hepatitis panel negative.  Worsening liver function today.  Ultrasound is down, RUQ [place] ordered, but likely won't be done for 24-48 hrs.  Continue to follow LFT's.  Poor PO intake: RD recommending NGT.  I  expect Craig Avery may improve enough over next 24-48 hrs to discharge.  Will hold off for now.  Consider if he has longer stay.  Continue home prednisolone acetate eye drops  DVT prophylaxis: lovenox Code Status: full Family Communication: none at bedside - discussed with daughter 1/3 Disposition Plan: pending further improvemetn   Consultants:   none  Procedures:   none  Antimicrobials: Anti-infectives (From admission, onward)   None     Subjective: Feels well Askign when he'll go home and how long he'll quarantine  Objective: Vitals:   07/15/19 0523 07/15/19 0600 07/15/19 0730 07/15/19 1200  BP: (!) 189/61  137/62   Pulse:  74 (!) 55   Resp:  16 18   Temp: 98.2 F (36.8 C)  97.7 F (36.5 C) 98.1 F (36.7 C)  TempSrc: Oral  Oral Oral  SpO2:  90% (!) 89%   Weight:      Height:        Intake/Output Summary (Last 24 hours) at 07/15/2019 1259 Last data filed at 07/15/2019 1000 Gross per 24 hour  Intake 600 ml  Output 650 ml  Net -50 ml   Filed Weights   07/12/19 1246  Weight: 92.1 kg    Examination:  General: No acute distress. Cardiovascular: RRR Lungs: Clear to auscultation bilaterally  Abdomen: Soft, nontender, nondistended  Neurological: Alert and oriented 3. Moves all extremities 4 . Cranial nerves II through XII grossly intact. Skin: Warm and dry. No rashes or lesions. Extremities: No clubbing or cyanosis. No edema  Data Reviewed: I have personally  reviewed following labs and imaging studies  CBC: Recent Labs  Lab 07/10/19 0312 07/11/19 0309 07/12/19 1315 07/13/19 0020 07/14/19 0353 07/15/19 0416  WBC 10.9* 10.8* 14.3* 13.1* 14.2* 14.9*  NEUTROABS 8.1* 7.9*  --  10.6* 11.4* 12.3*  HGB 10.5* 11.3* 11.2* 10.8* 10.5* 10.9*  HCT 31.4* 33.5* 34.1* 32.8* 31.4* 32.9*  MCV 93.2 91.0 93.2 94.0 93.2 93.5  PLT 333 343 417* 387 400 [id number]   Basic Metabolic Panel: Recent Labs  Lab 07/10/19 0604 07/11/19 0309 07/12/19 1315 07/13/19 0020  07/14/19 0353 07/15/19 0416  NA 143 140 141 141 140 140  K 4.2 4.3 4.9 5.1 4.5 4.5  CL 111 108 106 106 105 105  CO2 21* 25 23 23 22 23   GLUCOSE 107* 126* 113* 139* 155* 141*  BUN 43* 46* 64* 70* 80* 85*  CREATININE 1.54* 1.56* 1.79*  1.86* 1.78* 1.84* 1.76*  CALCIUM 7.9* 7.8* 8.6* 8.7* 8.3* 8.3*  MG 2.6* 2.7*  --  2.6* 2.6* 2.7*  PHOS 3.4 3.0  --  4.8* 5.8* 5.2*   GFR: Estimated Creatinine Clearance: 34.3 mL/min ([name]) (by C-G formula based on SCr of 1.76 mg/dL (H)). Liver Function Tests: Recent Labs  Lab 07/11/19 0309 07/12/19 1315 07/13/19 0020 07/14/19 0353 07/15/19 0416  AST 87* 72* 79* 53* 79*  ALT 112* 150* 161* 153* 215*  ALKPHOS 58 54 57 52 53  BILITOT 1.0 0.7 0.6 0.9 0.6  PROT 6.8 6.5 6.4* 6.1* 6.2*  ALBUMIN 3.3* 3.2* 3.0* 3.0* 2.9*   No results for input(s): LIPASE, AMYLASE in the last 168 hours. No results for input(s): AMMONIA in the last 168 hours. Coagulation Profile: No results for input(s): INR, PROTIME in the last 168 hours. Cardiac Enzymes: No results for input(s): CKTOTAL, CKMB, CKMBINDEX, TROPONINI in the last 168 hours. BNP (last 3 results) No results for input(s): PROBNP in the last 8760 hours. HbA1C: No results for input(s): HGBA1C in the last 72 hours. CBG: Recent Labs  Lab 07/09/19 0805  GLUCAP 119*   Lipid Profile: No results for input(s): CHOL, HDL, LDLCALC, TRIG, CHOLHDL, LDLDIRECT in the last 72 hours. Thyroid Function Tests: No results for input(s): TSH, T4TOTAL, FREET4, T3FREE, THYROIDAB in the last 72 hours. Anemia Panel: Recent Labs    07/14/19 0353 07/15/19 0416  FERRITIN 1,170* 1,372*   Sepsis Labs: Recent Labs  Lab 07/12/19 1315  PROCALCITON <0.10    No results found for this or any previous visit (from the past 240 hour(s)).       Radiology Studies: No results found.      Scheduled Meds: . amLODipine  10 mg Oral Daily  . ascorbic acid  500 mg Oral Daily  . aspirin  81 mg Oral Daily  . atorvastatin  20  mg Oral q1800  . clopidogrel  75 mg Oral Daily  . enoxaparin (LOVENOX) injection  40 mg Subcutaneous Q24H  . feeding supplement (ENSURE ENLIVE)  237 mL Oral TID BM  . methylPREDNISolone (SOLU-MEDROL) injection  0.5 mg/kg Intravenous Q12H  . prednisoLONE acetate  1 drop Both Eyes QID  . zinc sulfate  220 mg Oral Daily   Continuous Infusions:   LOS: 3 days    Time spent: over 30 min    Fayrene Helper, MD [facility] Pager AMION  If 7PM-7AM, please contact night-coverage [email or web address] Password [email or web address] 07/15/2019, 12:59 PM

## 2019-07-16 LAB — CBC WITH DIFFERENTIAL/PLATELET
Abs Immature Granulocytes: 0.31 10*3/uL — ABNORMAL HIGH (ref 0.00–0.07)
Basophils Absolute: 0 10*3/uL (ref 0.0–0.1)
Basophils Relative: 0 %
Eosinophils Absolute: 0 10*3/uL (ref 0.0–0.5)
Eosinophils Relative: 0 %
HCT: 33.5 % — ABNORMAL LOW (ref 39.0–52.0)
Hemoglobin: 11.1 g/dL — ABNORMAL LOW (ref 13.0–17.0)
Immature Granulocytes: 2 %
Lymphocytes Relative: 4 %
Lymphs Abs: 0.7 10*3/uL (ref 0.7–4.0)
MCH: 30.3 pg (ref 26.0–34.0)
MCHC: 33.1 g/dL (ref 30.0–36.0)
MCV: 91.5 fL (ref 80.0–100.0)
Monocytes Absolute: 0.9 10*3/uL (ref 0.1–1.0)
Monocytes Relative: 5 %
Neutro Abs: 15.9 10*3/uL — ABNORMAL HIGH (ref 1.7–7.7)
Neutrophils Relative %: 89 %
Platelets: 396 10*3/uL (ref 150–400)
RBC: 3.66 MIL/uL — ABNORMAL LOW (ref 4.22–5.81)
RDW: 12.5 % (ref 11.5–15.5)
WBC: 17.8 10*3/uL — ABNORMAL HIGH (ref 4.0–10.5)
nRBC: 0 % (ref 0.0–0.2)

## 2019-07-16 LAB — COMPREHENSIVE METABOLIC PANEL
ALT: 213 U/L — ABNORMAL HIGH (ref 0–44)
AST: 65 U/L — ABNORMAL HIGH (ref 15–41)
Albumin: 2.7 g/dL — ABNORMAL LOW (ref 3.5–5.0)
Alkaline Phosphatase: 50 U/L (ref 38–126)
Anion gap: 11 (ref 5–15)
BUN: 85 mg/dL — ABNORMAL HIGH (ref 8–23)
CO2: 21 mmol/L — ABNORMAL LOW (ref 22–32)
Calcium: 8.3 mg/dL — ABNORMAL LOW (ref 8.9–10.3)
Chloride: 108 mmol/L (ref 98–111)
Creatinine, Ser: 1.6 mg/dL — ABNORMAL HIGH (ref 0.61–1.24)
GFR calc Af Amer: 46 mL/min — ABNORMAL LOW (ref >60)
GFR calc non Af Amer: 40 mL/min — ABNORMAL LOW (ref >60)
Glucose, Bld: 121 mg/dL — ABNORMAL HIGH (ref 70–99)
Potassium: 4.5 mmol/L (ref 3.5–5.1)
Sodium: 140 mmol/L (ref 135–145)
Total Bilirubin: 0.8 mg/dL (ref 0.3–1.2)
Total Protein: 6 g/dL — ABNORMAL LOW (ref 6.5–8.1)

## 2019-07-16 LAB — FERRITIN: Ferritin: 1188 ng/mL — ABNORMAL HIGH (ref 24–336)

## 2019-07-16 LAB — PHOSPHORUS: Phosphorus: 4.4 mg/dL (ref 2.5–4.6)

## 2019-07-16 LAB — C-REACTIVE PROTEIN: CRP: 0.7 mg/dL (ref <1.0)

## 2019-07-16 LAB — MAGNESIUM: Magnesium: 2.7 mg/dL — ABNORMAL HIGH (ref 1.7–2.4)

## 2019-07-16 LAB — D-DIMER, QUANTITATIVE: D-Dimer, Quant: 0.58 ug/mL-FEU — ABNORMAL HIGH (ref 0.00–0.50)

## 2019-07-16 MED ORDER — DEXAMETHASONE 6 MG PO TABS
6.0000 mg | ORAL_TABLET | Freq: Once | ORAL | Status: AC
  Administered 2019-07-17: 10:00:00 6 mg via ORAL
  Filled 2019-07-16: qty 1

## 2019-07-16 NOTE — TOC Initial Note (Signed)
Transition of Care [facility]) - Initial/Assessment Note    Patient Details  Name: Craig Avery MRN: [id number] Date of Birth: [birth year]  Transition of Care [facility]) CM/SW Contact:    Shade Flood, LCSW Phone Number: 07/16/2019, 12:01 PM  Clinical Narrative:                  Pt admitted from home. Recommendation is for [facility] therapy at dc. Spoke with pt by phone to discuss. He is agreeable and requests Advanced [facility]. Referral given to Santiago Glad at [place] today.  Per pt, he has a walker and a wheelchair at home. He lives alone since his wife passed away last 27-Sep-2022 but his daughter and son in law live next door.  Anticipating dc this week. TOC will follow and continue to assist with dc planning needs.  Expected Discharge Plan: [place] Barriers to Discharge: Continued Medical Work up   Patient Goals and CMS Choice     Choice offered to / list presented to : Patient  Expected Discharge Plan and Services Expected Discharge Plan: [place] In-house Referral: Clinical Social Work   Post Acute Care Choice: [place] arrangements for the past 2 months: [place]: PT, OT [place] Agency: [place] ([facility]) Date HH Agency Contacted: 07/16/19 Time [place]: 75 Representative spoke with at [place]: Santiago Glad  Prior Living Arrangements/Services Living arrangements for the past 2 months: Single Family Home Lives with:: Self Patient language and need for interpreter reviewed:: Yes Do you feel safe going back to the place where you live?: Yes      Need for Family Participation in Patient Care: No (Comment) Care giver support system in place?: Yes (comment)   Criminal Activity/Legal Involvement Pertinent to Current Situation/Hospitalization: No - Comment as needed  Activities of Daily Living Home Assistive Devices/Equipment: None ADL Screening (condition at time of  admission) Patient's cognitive ability adequate to safely complete daily activities?: Yes Is the patient deaf or have difficulty hearing?: No Does the patient have difficulty seeing, even when wearing glasses/contacts?: No Does the patient have difficulty concentrating, remembering, or making decisions?: No Patient able to express need for assistance with ADLs?: Yes Does the patient have difficulty dressing or bathing?: Yes Independently performs ADLs?: Yes (appropriate for developmental age) Does the patient have difficulty walking or climbing stairs?: No Weakness of Legs: None Weakness of Arms/Hands: None  Permission Sought/Granted                  Emotional Assessment       Orientation: : Oriented to Self, Oriented to Place, Oriented to  Time, Oriented to Situation Alcohol / Substance Use: Not Applicable Psych Involvement: No (comment)  Admission diagnosis:  COVID-19 virus infection [U07.1] Patient Active Problem List   Diagnosis Date Noted  . COVID-19 virus infection 07/12/2019  . COVID-19 07/06/2019  . Hypoxia   . Claudication in peripheral vascular disease ([place]) 01/02/2019  . PVD (peripheral vascular disease) with claudication ([place]) 11/16/2017  . Smoking history 04/27/2017  . Right shoulder pain 11/24/2015  . Personal history of colonic polyps   . Vasovagal syncope 11/20/2014  . Constipation 11/20/2014  . Medicare annual wellness visit, subsequent 03/25/2014  . Advanced care planning/counseling discussion 03/25/2014  . CKD (chronic kidney disease) stage 3, GFR 30-59  ml/min 03/25/2014  . Fatigue 12/13/2012  . Disturbance of skin sensation 08/10/2012  . Obesity, Class I, BMI 30-34.9   . TIA (transient ischemic attack) 05/21/2012  . Hyperlipidemia LDL goal <70 05/21/2012  . HTN (hypertension) 05/18/2012  . History of stroke 05/18/2012  . Carotid stenosis, bilateral 05/18/2012  . Stenosis of carotid artery 05/18/2012  . Left homonymous hemianopsia 05/12/2012    PCP:  [name], MD Pharmacy:   [id number] - [place], [place] - [address] [place] [address] Phone: [phone] Fax: [place], [place] [address] Phone: [phone] Fax: [phone]     Social Determinants of Health (SDOH) Interventions    Readmission Risk Interventions Readmission Risk Prevention Plan 07/16/2019 07/11/2019  Transportation Screening Complete -  Home Care Screening Complete -  Medication Review (RN CM) Complete -  Medication Review (RN Care Manager) - Complete  Some recent data might be hidden

## 2019-07-16 NOTE — Progress Notes (Signed)
Patient's daughter Collie Siad called and given update.

## 2019-07-16 NOTE — Progress Notes (Signed)
PROGRESS NOTE    Zyad [facility]  [id number] DOB: July 05, 1941 DOA: 07/12/2019 PCP: [name], MD   Brief Narrative:  QUENTARIUS KADRI is Dandria Griego 79 y.o. male with medical history significant of CVA, HTN, HLD, carotid stenosis s/p L CEA who presented to [facility] on 12/25 with fever and weakness.  He initially had nasal congestion and scratchy throat, then developed nausea and decreased appetite.  He presented to the ED after developing fevers, chills and generalized weakness.  He's been treated at [facility] with steroids and remdesivir, but due to increasing O2 requirement, he was transferred to [facility].  Hospitalization complicated by visual hallucinations and the patient was seen by psychiatry.      Assessment & Plan:   Active Problems:   COVID-19 virus infection  Acute Hypoxic Respiratory Failure Secondary to COVID 19 Virus Infection:   Admitted 12/25 at [facility] On 5 L, but weaned back to 2 L  S/p remdesivir.  Continue steroids. Consider actemra if worsening O2 requirement I/O, daily weights IS, OOB, prone as able Daily inflammatory labs  COVID-19 Labs  Recent Labs    07/14/19 0353 07/15/19 0416 07/16/19 0255  DDIMER 0.53* 0.44 0.58*  FERRITIN 1,170* 1,372* 1,188*  CRP 0.7 0.7 0.7    No results found for: SARSCOV2NAA  AKI on CKD III: baseline creatinine was 2 in 05/2019.  Peaked at 2.48 on admission.  Now improved.  Follow.  Hypertension: continue amlodipine, hold HCTZ and lisinopril with renal function  PVD  Hx CVA: continue aspirin, plavix, statin  Visual Hallucination  Anxiety and Panic Attack: seen by psych at OSH who recommended low dose zyprexa as needed for hallucinations Delirium precautions  Elevated LFT's: likely 2/2 COVID infection, follow.  Acute hepatitis panel negative.  Worsening liver function today - relatively stable.  Ultrasound is down, RUQ [place] ordered, but likely won't be done for 24-48 hrs.  Continue to follow LFT's.  Poor PO intake: RD  recommending NGT.  I expect Mr. Yambao may improve enough over next 24-48 hrs to discharge.  Will hold off for now.  Consider if he has longer stay.  Continue home prednisolone acetate eye drops  DVT prophylaxis: lovenox Code Status: full Family Communication: none at bedside - discussed with daughter 1/4 Disposition Plan: pending further improvemetn   Consultants:   none  Procedures:   none  Antimicrobials: Anti-infectives (From admission, onward)   None     Subjective: Asking about plan for discharge  Objective: Vitals:   07/16/19 0742 07/16/19 1129 07/16/19 1131 07/16/19 1622  BP:   (!) 146/69 (!) 153/66  Pulse:  77 90 (!) 59  Resp:  (!) 25 16 (!) 21  Temp: 98.3 F (36.8 C)  97.7 F (36.5 C) 98.3 F (36.8 C)  TempSrc: Oral  Oral Oral  SpO2:  92% 93% 93%  Weight:      Height:        Intake/Output Summary (Last 24 hours) at 07/16/2019 1833 Last data filed at 07/16/2019 1400 Gross per 24 hour  Intake 240 ml  Output 1125 ml  Net -885 ml   Filed Weights   07/12/19 1246  Weight: 92.1 kg    Examination:  General: No acute distress. Cardiovascular: RRR Lungs: Clear to auscultation bilaterally Abdomen: Soft, nontender, nondistended Neurological: Alert and oriented 3. Moves all extremities 4. Cranial nerves II through XII grossly intact. Skin: Warm and dry. No rashes or lesions. Extremities: No clubbing or cyanosis. No edema.   Data Reviewed: I have personally  reviewed following labs and imaging studies  CBC: Recent Labs  Lab 07/11/19 0309 07/12/19 1315 07/13/19 0020 07/14/19 0353 07/15/19 0416 07/16/19 0255  WBC 10.8* 14.3* 13.1* 14.2* 14.9* 17.8*  NEUTROABS 7.9*  --  10.6* 11.4* 12.3* 15.9*  HGB 11.3* 11.2* 10.8* 10.5* 10.9* 11.1*  HCT 33.5* 34.1* 32.8* 31.4* 32.9* 33.5*  MCV 91.0 93.2 94.0 93.2 93.5 91.5  PLT 343 417* 387 400 395 [id number]   Basic Metabolic Panel: Recent Labs  Lab 07/11/19 0309 07/12/19 1315 07/13/19 0020 07/14/19 0353  07/15/19 0416 07/16/19 0255  NA 140 141 141 140 140 140  K 4.3 4.9 5.1 4.5 4.5 4.5  CL 108 106 106 105 105 108  CO2 25 23 23 22 23  21*  GLUCOSE 126* 113* 139* 155* 141* 121*  BUN 46* 64* 70* 80* 85* 85*  CREATININE 1.56* 1.79*  1.86* 1.78* 1.84* 1.76* 1.60*  CALCIUM 7.8* 8.6* 8.7* 8.3* 8.3* 8.3*  MG 2.7*  --  2.6* 2.6* 2.7* 2.7*  PHOS 3.0  --  4.8* 5.8* 5.2* 4.4   GFR: Estimated Creatinine Clearance: 37.7 mL/min (Yenni Carra) (by C-G formula based on SCr of 1.6 mg/dL (H)). Liver Function Tests: Recent Labs  Lab 07/12/19 1315 07/13/19 0020 07/14/19 0353 07/15/19 0416 07/16/19 0255  AST 72* 79* 53* 79* 65*  ALT 150* 161* 153* 215* 213*  ALKPHOS 54 57 52 53 50  BILITOT 0.7 0.6 0.9 0.6 0.8  PROT 6.5 6.4* 6.1* 6.2* 6.0*  ALBUMIN 3.2* 3.0* 3.0* 2.9* 2.7*   No results for input(s): LIPASE, AMYLASE in the last 168 hours. No results for input(s): AMMONIA in the last 168 hours. Coagulation Profile: No results for input(s): INR, PROTIME in the last 168 hours. Cardiac Enzymes: No results for input(s): CKTOTAL, CKMB, CKMBINDEX, TROPONINI in the last 168 hours. BNP (last 3 results) No results for input(s): PROBNP in the last 8760 hours. HbA1C: No results for input(s): HGBA1C in the last 72 hours. CBG: No results for input(s): GLUCAP in the last 168 hours. Lipid Profile: No results for input(s): CHOL, HDL, LDLCALC, TRIG, CHOLHDL, LDLDIRECT in the last 72 hours. Thyroid Function Tests: No results for input(s): TSH, T4TOTAL, FREET4, T3FREE, THYROIDAB in the last 72 hours. Anemia Panel: Recent Labs    07/15/19 0416 07/16/19 0255  FERRITIN 1,372* 1,188*   Sepsis Labs: Recent Labs  Lab 07/12/19 1315  PROCALCITON <0.10    No results found for this or any previous visit (from the past 240 hour(s)).       Radiology Studies: No results found.      Scheduled Meds: . amLODipine  10 mg Oral Daily  . ascorbic acid  500 mg Oral Daily  . aspirin  81 mg Oral Daily  .  atorvastatin  20 mg Oral q1800  . clopidogrel  75 mg Oral Daily  . enoxaparin (LOVENOX) injection  40 mg Subcutaneous Q24H  . feeding supplement (ENSURE ENLIVE)  237 mL Oral TID BM  . prednisoLONE acetate  1 drop Both Eyes QID  . zinc sulfate  220 mg Oral Daily   Continuous Infusions:   LOS: 4 days    Time spent: over 30 min    Fayrene Helper, MD [facility] Pager AMION  If 7PM-7AM, please contact night-coverage [email or web address] Password [facility] 07/16/2019, 6:33 PM

## 2019-07-16 NOTE — Plan of Care (Signed)
  Problem: Education: Goal: Knowledge of risk factors and measures for prevention of condition will improve Outcome: Progressing   Problem: Coping: Goal: Psychosocial and spiritual needs will be supported Outcome: Progressing   Problem: Respiratory: Goal: Will maintain a patent airway Outcome: Progressing Goal: Complications related to the disease process, condition or treatment will be avoided or minimized Outcome: Progressing   

## 2019-07-17 ENCOUNTER — Inpatient Hospital Stay (HOSPITAL_COMMUNITY): Payer: Medicare Other

## 2019-07-17 LAB — PROCALCITONIN: Procalcitonin: 0.5 ng/mL

## 2019-07-17 LAB — CBC WITH DIFFERENTIAL/PLATELET
Abs Immature Granulocytes: 0.14 10*3/uL — ABNORMAL HIGH (ref 0.00–0.07)
Basophils Absolute: 0 10*3/uL (ref 0.0–0.1)
Basophils Relative: 0 %
Eosinophils Absolute: 0 10*3/uL (ref 0.0–0.5)
Eosinophils Relative: 0 %
HCT: 34.7 % — ABNORMAL LOW (ref 39.0–52.0)
Hemoglobin: 11.7 g/dL — ABNORMAL LOW (ref 13.0–17.0)
Immature Granulocytes: 1 %
Lymphocytes Relative: 15 %
Lymphs Abs: 1.4 10*3/uL (ref 0.7–4.0)
MCH: 31.3 pg (ref 26.0–34.0)
MCHC: 33.7 g/dL (ref 30.0–36.0)
MCV: 92.8 fL (ref 80.0–100.0)
Monocytes Absolute: 0.3 10*3/uL (ref 0.1–1.0)
Monocytes Relative: 3 %
Neutro Abs: 7.8 10*3/uL — ABNORMAL HIGH (ref 1.7–7.7)
Neutrophils Relative %: 81 %
Platelets: 333 10*3/uL (ref 150–400)
RBC: 3.74 MIL/uL — ABNORMAL LOW (ref 4.22–5.81)
RDW: 12.9 % (ref 11.5–15.5)
WBC: 9.7 10*3/uL (ref 4.0–10.5)
nRBC: 0 % (ref 0.0–0.2)

## 2019-07-17 LAB — COMPREHENSIVE METABOLIC PANEL
ALT: 150 U/L — ABNORMAL HIGH (ref 0–44)
AST: 35 U/L (ref 15–41)
Albumin: 2.6 g/dL — ABNORMAL LOW (ref 3.5–5.0)
Alkaline Phosphatase: 47 U/L (ref 38–126)
Anion gap: 11 (ref 5–15)
BUN: 65 mg/dL — ABNORMAL HIGH (ref 8–23)
CO2: 24 mmol/L (ref 22–32)
Calcium: 8 mg/dL — ABNORMAL LOW (ref 8.9–10.3)
Chloride: 104 mmol/L (ref 98–111)
Creatinine, Ser: 1.39 mg/dL — ABNORMAL HIGH (ref 0.61–1.24)
GFR calc Af Amer: 55 mL/min — ABNORMAL LOW (ref >60)
GFR calc non Af Amer: 48 mL/min — ABNORMAL LOW (ref >60)
Glucose, Bld: 84 mg/dL (ref 70–99)
Potassium: 4.1 mmol/L (ref 3.5–5.1)
Sodium: 139 mmol/L (ref 135–145)
Total Bilirubin: 1.1 mg/dL (ref 0.3–1.2)
Total Protein: 5.8 g/dL — ABNORMAL LOW (ref 6.5–8.1)

## 2019-07-17 LAB — PHOSPHORUS: Phosphorus: 2.6 mg/dL (ref 2.5–4.6)

## 2019-07-17 LAB — FERRITIN: Ferritin: 1506 ng/mL — ABNORMAL HIGH (ref 24–336)

## 2019-07-17 LAB — D-DIMER, QUANTITATIVE: D-Dimer, Quant: 0.87 ug/mL-FEU — ABNORMAL HIGH (ref 0.00–0.50)

## 2019-07-17 LAB — MAGNESIUM: Magnesium: 2.4 mg/dL (ref 1.7–2.4)

## 2019-07-17 LAB — HIV ANTIBODY (ROUTINE TESTING W REFLEX): HIV Screen 4th Generation wRfx: NONREACTIVE

## 2019-07-17 LAB — MRSA PCR SCREENING: MRSA by PCR: NEGATIVE

## 2019-07-17 LAB — C-REACTIVE PROTEIN: CRP: 10.1 mg/dL — ABNORMAL HIGH (ref <1.0)

## 2019-07-17 MED ORDER — SODIUM CHLORIDE 0.9 % IV SOLN
2.0000 g | Freq: Two times a day (BID) | INTRAVENOUS | Status: AC
  Administered 2019-07-17 – 2019-07-23 (×14): 2 g via INTRAVENOUS
  Filled 2019-07-17 (×15): qty 2

## 2019-07-17 NOTE — Progress Notes (Signed)
Spoke with Gerome Apley, Agricultural consultant, about order for cortrak insertion order.  She stated [facility] has a team that inserts these and work during day hours.  She will pass along to day shift charge nurses to contact the team for insertion in the morning.  This nurse will pass on to day shift primary nurse in the morning as well.  Updated patient on plan and patient voices understanding.

## 2019-07-17 NOTE — Progress Notes (Signed)
Pharmacy Antibiotic Note  Craig Avery is a 79 y.o. male admitted on 07/12/2019 with acute respiratory failure due to COVID-19 infection.  Pharmacy has been consulted for cefepime dosing for possible superimposed bacterial PNA.  PCT 0.5 (in setting of AKI)  Plan: -Cefepime 2 g iv q 12 h -Monitor SCr, PCT, culture results -F/U duration of therapy  Height: 5\' 4"  (162.6 cm) Weight: 203 lb 0.7 oz (92.1 kg) IBW/kg (Calculated) : 59.2  Temp (24hrs), Avg:98.2 F (36.8 C), Min:98 F (36.7 C), Max:98.4 F (36.9 C)  Recent Labs  Lab 07/13/19 0020 07/14/19 0353 07/15/19 0416 07/16/19 0255 07/17/19 0230  WBC 13.1* 14.2* 14.9* 17.8* 9.7  CREATININE 1.78* 1.84* 1.76* 1.60* 1.39*    Estimated Creatinine Clearance: 43.4 mL/min (A) (by C-G formula based on SCr of 1.39 mg/dL (H)).    No Known Allergies  Antimicrobials this admission: 12/25 remdesivir >> 12/29 1/5 cefepime >>   Microbiology results: 1/5 Sputum:   1/5 MRSA PCR:   Thank you for allowing pharmacy to be a part of this patient's care.  Ulice Dash D 07/17/2019 12:06 PM

## 2019-07-17 NOTE — Progress Notes (Addendum)
PROGRESS NOTE    Craig Avery [facility]  [id number] DOB: Nov 19, 1940 DOA: 07/12/2019 PCP: Craig Bush, MD   Brief Narrative:  Craig Avery is Craig Avery 79 y.o. male with medical history significant of CVA, HTN, HLD, carotid stenosis s/p L CEA who presented to [facility] on 12/25 with fever and weakness.  He initially had nasal congestion and scratchy throat, then developed nausea and decreased appetite.  He presented to the ED after developing fevers, chills and generalized weakness.  He's been treated at [facility] with steroids and remdesivir, but due to increasing O2 requirement, he was transferred to [facility].  Hospitalization complicated by visual hallucinations and the patient was seen by psychiatry.      Assessment & Plan:   Active Problems:   COVID-19 virus infection  Acute Hypoxic Respiratory Failure Secondary to COVID 19 Virus Infection  Possible HCAP:   Admitted 12/25 at [facility] O2 requirement increased to 5 L Craig Avery CRP up and procalcitonin 0.5 today, will treat for HCAP  Cefepime, follow MRSA PCR, consider adding vanc if positive or not improving Follow sputum cx S/p remdesivir.  Continue steroids. Consider actemra if worsening O2 requirement I/O, daily weights IS, OOB, prone as able Daily inflammatory labs  COVID-19 Labs  Recent Labs    07/15/19 0416 07/16/19 0255 07/17/19 0230  DDIMER 0.44 0.58* 0.87*  FERRITIN 1,372* 1,188* 1,506*  CRP 0.7 0.7 10.1*    No results found for: SARSCOV2NAA  AKI on CKD III: baseline creatinine was 2 in 05/2019.  Peaked at 2.48 on admission.  Now improved.  Follow.  Hypertension: continue amlodipine, hold HCTZ and lisinopril with renal function  PVD  Hx CVA: continue aspirin, plavix, statin  Visual Hallucination  Anxiety and Panic Attack: seen by psych at OSH who recommended low dose zyprexa as needed for hallucinations Delirium precautions  Elevated LFT's: likely 2/2 COVID infection, follow.  Acute hepatitis panel negative.   Worsening liver function today - relatively stable.  Ultrasound is down, RUQ [place] negative.  Continue to follow LFT's.  Poor PO intake: RD recommending NGT.  Order for cortrak placed, this should be placed 1/6.  Depressed Mood: 2/2 prolonged hospital stay, continue to monitor, chaplain   Continue home prednisolone acetate eye drops  DVT prophylaxis: lovenox Code Status: full Family Communication: none at bedside - discussed with daughter 1/5 Disposition Plan: pending further improvemetn   Consultants:   none  Procedures:   none  Antimicrobials: Anti-infectives (From admission, onward)   Start     Dose/Rate Route Frequency Ordered Stop   07/17/19 1300  ceFEPIme (MAXIPIME) 2 g in sodium chloride 0.9 % 100 mL IVPB     2 g 200 mL/hr over 30 Minutes Intravenous Every 12 hours 07/17/19 1138       Subjective: Disappointed Starting to think he may not discharge  Objective: Vitals:   07/17/19 0833 07/17/19 0900 07/17/19 1207 07/17/19 1558  BP: (!) 143/66  (!) 151/72 (!) 135/59  Pulse: (!) 102  (!) 111   Resp: (!) 25  (!) 23   Temp: 98.1 F (36.7 C)  97.8 F (36.6 C) 98 F (36.7 C)  TempSrc: Oral  Oral Oral  SpO2: (!) 87% 90% 93% 95%  Weight:      Height:        Intake/Output Summary (Last 24 hours) at 07/17/2019 1819 Last data filed at 07/17/2019 1415 Gross per 24 hour  Intake 360 ml  Output 450 ml  Net -90 ml   [employer]  07/12/19 1246  Weight: 92.1 kg    Examination:  General: No acute distress. Cardiovascular: RRR Lungs: Clear to auscultation bilaterally Abdomen: Soft, nontender, nondistended Neurological: Alert and oriented 3. Moves all extremities 4. Cranial nerves II through XII grossly intact. Skin: Warm and dry. No rashes or lesions. Extremities: No clubbing or cyanosis. No edema.   Data Reviewed: I have personally reviewed following labs and imaging studies  CBC: Recent Labs  Lab 07/13/19 0020 07/14/19 0353 07/15/19 0416 07/16/19 0255  07/17/19 0230  WBC 13.1* 14.2* 14.9* 17.8* 9.7  NEUTROABS 10.6* 11.4* 12.3* 15.9* 7.8*  HGB 10.8* 10.5* 10.9* 11.1* 11.7*  HCT 32.8* 31.4* 32.9* 33.5* 34.7*  MCV 94.0 93.2 93.5 91.5 92.8  PLT 387 400 395 396 0000000   Basic Metabolic Panel: Recent Labs  Lab 07/13/19 0020 07/14/19 0353 07/15/19 0416 07/16/19 0255 07/17/19 0230  NA 141 140 140 140 139  K 5.1 4.5 4.5 4.5 4.1  CL 106 105 105 108 104  CO2 23 22 23  21* 24  GLUCOSE 139* 155* 141* 121* 84  BUN 70* 80* 85* 85* 65*  CREATININE 1.78* 1.84* 1.76* 1.60* 1.39*  CALCIUM 8.7* 8.3* 8.3* 8.3* 8.0*  MG 2.6* 2.6* 2.7* 2.7* 2.4  PHOS 4.8* 5.8* 5.2* 4.4 2.6   GFR: Estimated Creatinine Clearance: 43.4 mL/min (Craig Avery) (by C-G formula based on SCr of 1.39 mg/dL (H)). Liver Function Tests: Recent Labs  Lab 07/13/19 0020 07/14/19 0353 07/15/19 0416 07/16/19 0255 07/17/19 0230  AST 79* 53* 79* 65* 35  ALT 161* 153* 215* 213* 150*  ALKPHOS 57 52 53 50 47  BILITOT 0.6 0.9 0.6 0.8 1.1  PROT 6.4* 6.1* 6.2* 6.0* 5.8*  ALBUMIN 3.0* 3.0* 2.9* 2.7* 2.6*   No results for input(s): LIPASE, AMYLASE in the last 168 hours. No results for input(s): AMMONIA in the last 168 hours. Coagulation Profile: No results for input(s): INR, PROTIME in the last 168 hours. Cardiac Enzymes: No results for input(s): CKTOTAL, CKMB, CKMBINDEX, TROPONINI in the last 168 hours. BNP (last 3 results) No results for input(s): PROBNP in the last 8760 hours. HbA1C: No results for input(s): HGBA1C in the last 72 hours. CBG: No results for input(s): GLUCAP in the last 168 hours. Lipid Profile: No results for input(s): CHOL, HDL, LDLCALC, TRIG, CHOLHDL, LDLDIRECT in the last 72 hours. Thyroid Function Tests: No results for input(s): TSH, T4TOTAL, FREET4, T3FREE, THYROIDAB in the last 72 hours. Anemia Panel: Recent Labs    07/16/19 0255 07/17/19 0230  FERRITIN 1,188* 1,506*   Sepsis Labs: Recent Labs  Lab 07/12/19 1315 07/17/19 0230  PROCALCITON <0.10 0.50     No results found for this or any previous visit (from the past 240 hour(s)).       Radiology Studies: DG CHEST PORT 1 VIEW  Result Date: 07/17/2019 CLINICAL DATA:  Pt hypoxic and desating. Covid + EXAM: PORTABLE CHEST 1 VIEW COMPARISON:  12/11/2019 FINDINGS: Bilateral upper lobe airspace disease is increased density compared to prior. No pleural fluid. No pneumothorax. No acute osseous abnormality. IMPRESSION: Increase in density of bilateral upper lobe airspace disease. Electronically Signed   By: Suzy Bouchard M.D.   On: 07/17/2019 10:18   US Abdomen Limited RUQ  Result Date: 07/17/2019 CLINICAL DATA:  79 year old male COVID-19.  Elevated liver enzymes. EXAM: ULTRASOUND ABDOMEN LIMITED RIGHT UPPER QUADRANT COMPARISON:  None. FINDINGS: Gallbladder: No gallstones or wall thickening visualized. No sonographic Murphy sign noted by sonographer. Common bile duct: Diameter: 3 millimeters, normal. Liver: Background liver  echogenicity is within normal limits (image 22). No discrete liver lesion. No intrahepatic biliary ductal dilatation. Portal vein is patent on color Doppler imaging with normal direction of blood flow towards the liver. Other: Negative visible right kidney. IMPRESSION: Right upper quadrant ultrasound is within normal limits. Electronically Signed   By: Genevie Ann M.D.   On: 07/17/2019 09:47        Scheduled Meds: . amLODipine  10 mg Oral Daily  . ascorbic acid  500 mg Oral Daily  . aspirin  81 mg Oral Daily  . atorvastatin  20 mg Oral q1800  . clopidogrel  75 mg Oral Daily  . enoxaparin (LOVENOX) injection  40 mg Subcutaneous Q24H  . feeding supplement (ENSURE ENLIVE)  237 mL Oral TID BM  . prednisoLONE acetate  1 drop Both Eyes QID  . zinc sulfate  220 mg Oral Daily   Continuous Infusions: . ceFEPime (MAXIPIME) IV Stopped (07/17/19 1415)     LOS: 5 days    Time spent: over 30 min    Fayrene Helper, MD [facility] Pager AMION  If 7PM-7AM, please  contact night-coverage [email or web address] Password [facility] 07/17/2019, 6:19 PM

## 2019-07-17 NOTE — Progress Notes (Signed)
Physical Therapy Treatment Patient Details Name: Craig Avery MRN: [id number] DOB: 1940-11-25 Today's Date: 07/17/2019    History of Present Illness 79 y.o. male with medical history significant of CVA, HTN, HLD, carotid stenosis s/p L CEA who presented to [facility] on 12/25 with fever and weakness.  He initially had nasal congestion and scratchy throat, then developed nausea and decreased appetite.  He presented to the ED after developing fevers, chills and generalized weakness.  He's been treated at [facility] with steroids and remdesivir, but due to increasing O2 requirement, he was transferred to [facility].  Hospitalization complicated by visual hallucinations and the patient was seen by psychiatry.        PT Comments    Pt is highly motivated to participate in order to improve and go home, today he had difficulty with tolerating tx, needing to change certain tasks or have therapist complete for patient. Attempted to stand at sink to brush teeth wash face, was not able to tolerate this hence sat bed side to complete face wash, attempted to have pt wash other body parts but pt unable to tolerate this. Pt was able to ambulate around room approx 10ft with no AD and SBA but therapist noted multiple loses of balance, which pt joked about saying to effect of he meant to do that. Pt was on 6L/min via [place] completed ambulation and desat to low 80s, pt took approx 4 mins to recover sats to high 80s then 90s, with continued cues for pursed lip breathing. As pt does live alone, even with kids next door, may need to change dc disposition if he does not show improvement in nest few days. He may need increased supervision for safety.    Follow Up Recommendations  Home health PT     Equipment Recommendations  None recommended by PT    Recommendations for Other Services       Precautions / Restrictions Precautions Precautions: Fall Precaution Comments: desats with mobility Restrictions Weight Bearing  Restrictions: No    Mobility  Bed Mobility Overal bed mobility: Needs Assistance Bed Mobility: Supine to Sit;Sit to Supine     Supine to sit: Supervision Sit to supine: Supervision      Transfers Overall transfer level: Needs assistance Equipment used: None Transfers: Sit to/from [employer] Transfers Sit to Stand: Supervision Stand pivot transfers: Supervision          Ambulation/Gait Ambulation/Gait assistance: Min guard Gait Distance (Feet): 80 Feet Assistive device: None Gait Pattern/deviations: Step-through pattern Gait velocity: decreased cadence   General Gait Details: multi loses of balance noted especially with fatigue pt ambulated on 6L/min via [place] with desat to low 80s after ambulation, took approx 42min sto recover to high 80s/90s.   Stairs             Wheelchair Mobility    Modified Rankin (Stroke Patients Only)       Balance Overall balance assessment: Needs assistance Sitting-balance support: Feet supported Sitting balance-Leahy Scale: Fair     Standing balance support: During functional activity;No upper extremity supported Standing balance-Leahy Scale: Poor                              Cognition Arousal/Alertness: Lethargic Behavior During Therapy: WFL for tasks assessed/performed Overall Cognitive Status: Within Functional Limits for tasks assessed  Exercises Other Exercises Other Exercises: reinforced use of incentive spirometer Other Exercises: reinforced use of flutter valve    General Comments        Pertinent Vitals/Pain Pain Assessment: Faces Faces Pain Scale: Hurts a little bit Pain Location: genarlly with increased work of breathing    Home Living                      Prior Function            PT Goals (current goals can now be found in the care plan section) Acute Rehab PT Goals Patient Stated Goal: really wants to get better  and go home but seemed down this session as he was not tolerating activities as he would have liked PT Goal Formulation: With patient Time For Goal Achievement: 07/28/19 Potential to Achieve Goals: Fair Progress towards PT goals: Not progressing toward goals - comment;PT to reassess next treatment    Frequency    Min 3X/week      PT Plan Discharge plan needs to be updated    Co-evaluation              AM-PAC PT "6 Clicks" Mobility   Outcome Measure  Help needed turning from your back to your side while in a flat bed without using bedrails?: A Little Help needed moving from lying on your back to sitting on the side of a flat bed without using bedrails?: A Little Help needed moving to and from a bed to a chair (including a wheelchair)?: A Little Help needed standing up from a chair using your arms (e.g., wheelchair or bedside chair)?: A Little Help needed to walk in hospital room?: A Little Help needed climbing 3-5 steps with a railing? : A Lot 6 Click Score: 17    End of Session Equipment Utilized During Treatment: Oxygen Activity Tolerance: Treatment limited secondary to medical complications (Comment);Patient limited by fatigue;Patient limited by lethargy Patient left: in chair;with call bell/phone within reach Nurse Communication: Mobility status PT Visit Diagnosis: Other abnormalities of gait and mobility (R26.89);Muscle weakness (generalized) (M62.81)     Time: [id number] PT Time Calculation (min) (ACUTE ONLY): 41 min  Charges:  $Gait Training: 8-22 mins $Therapeutic Exercise: 8-22 mins $Therapeutic Activity: 8-22 mins                     Horald Chestnut, PT    Delford Field 07/17/2019, 1:20 PM

## 2019-07-18 ENCOUNTER — Inpatient Hospital Stay (HOSPITAL_COMMUNITY): Payer: Medicare Other

## 2019-07-18 DIAGNOSIS — J9601 Acute respiratory failure with hypoxia: Secondary | ICD-10-CM

## 2019-07-18 DIAGNOSIS — J189 Pneumonia, unspecified organism: Secondary | ICD-10-CM

## 2019-07-18 DIAGNOSIS — J1282 Pneumonia due to coronavirus disease 2019: Secondary | ICD-10-CM

## 2019-07-18 LAB — COMPREHENSIVE METABOLIC PANEL
ALT: 144 U/L — ABNORMAL HIGH (ref 0–44)
AST: 41 U/L (ref 15–41)
Albumin: 2.6 g/dL — ABNORMAL LOW (ref 3.5–5.0)
Alkaline Phosphatase: 55 U/L (ref 38–126)
Anion gap: 12 (ref 5–15)
BUN: 60 mg/dL — ABNORMAL HIGH (ref 8–23)
CO2: 22 mmol/L (ref 22–32)
Calcium: 8.6 mg/dL — ABNORMAL LOW (ref 8.9–10.3)
Chloride: 104 mmol/L (ref 98–111)
Creatinine, Ser: 1.3 mg/dL — ABNORMAL HIGH (ref 0.61–1.24)
GFR calc Af Amer: 60 mL/min — ABNORMAL LOW (ref >60)
GFR calc non Af Amer: 52 mL/min — ABNORMAL LOW (ref >60)
Glucose, Bld: 158 mg/dL — ABNORMAL HIGH (ref 70–99)
Potassium: 4.5 mmol/L (ref 3.5–5.1)
Sodium: 138 mmol/L (ref 135–145)
Total Bilirubin: 0.6 mg/dL (ref 0.3–1.2)
Total Protein: 6.3 g/dL — ABNORMAL LOW (ref 6.5–8.1)

## 2019-07-18 LAB — CBC WITH DIFFERENTIAL/PLATELET
Abs Immature Granulocytes: 0.19 10*3/uL — ABNORMAL HIGH (ref 0.00–0.07)
Basophils Absolute: 0 10*3/uL (ref 0.0–0.1)
Basophils Relative: 0 %
Eosinophils Absolute: 0 10*3/uL (ref 0.0–0.5)
Eosinophils Relative: 0 %
HCT: 34.9 % — ABNORMAL LOW (ref 39.0–52.0)
Hemoglobin: 11.7 g/dL — ABNORMAL LOW (ref 13.0–17.0)
Immature Granulocytes: 2 %
Lymphocytes Relative: 12 %
Lymphs Abs: 1 10*3/uL (ref 0.7–4.0)
MCH: 31 pg (ref 26.0–34.0)
MCHC: 33.5 g/dL (ref 30.0–36.0)
MCV: 92.3 fL (ref 80.0–100.0)
Monocytes Absolute: 0.2 10*3/uL (ref 0.1–1.0)
Monocytes Relative: 2 %
Neutro Abs: 7.4 10*3/uL (ref 1.7–7.7)
Neutrophils Relative %: 84 %
Platelets: 326 10*3/uL (ref 150–400)
RBC: 3.78 MIL/uL — ABNORMAL LOW (ref 4.22–5.81)
RDW: 12.8 % (ref 11.5–15.5)
WBC: 8.8 10*3/uL (ref 4.0–10.5)
nRBC: 0 % (ref 0.0–0.2)

## 2019-07-18 LAB — PHOSPHORUS
Phosphorus: 2.8 mg/dL (ref 2.5–4.6)
Phosphorus: 2.8 mg/dL (ref 2.5–4.6)

## 2019-07-18 LAB — GLUCOSE, CAPILLARY
Glucose-Capillary: 241 mg/dL — ABNORMAL HIGH (ref 70–99)
Glucose-Capillary: 288 mg/dL — ABNORMAL HIGH (ref 70–99)

## 2019-07-18 LAB — D-DIMER, QUANTITATIVE: D-Dimer, Quant: 0.98 ug/mL-FEU — ABNORMAL HIGH (ref 0.00–0.50)

## 2019-07-18 LAB — MAGNESIUM
Magnesium: 2.4 mg/dL (ref 1.7–2.4)
Magnesium: 2.2 mg/dL (ref 1.7–2.4)

## 2019-07-18 LAB — FERRITIN: Ferritin: 1642 ng/mL — ABNORMAL HIGH (ref 24–336)

## 2019-07-18 LAB — C-REACTIVE PROTEIN: CRP: 10 mg/dL — ABNORMAL HIGH (ref <1.0)

## 2019-07-18 LAB — PROCALCITONIN: Procalcitonin: 0.27 ng/mL

## 2019-07-18 MED ORDER — OSMOLITE 1.5 CAL PO LIQD
1000.0000 mL | ORAL | Status: DC
  Administered 2019-07-18 – 2019-07-24 (×12): 1000 mL
  Filled 2019-07-18 (×9): qty 1000

## 2019-07-18 MED ORDER — JEVITY 1.2 CAL PO LIQD
1000.0000 mL | ORAL | Status: DC
  Filled 2019-07-18 (×2): qty 1000

## 2019-07-18 MED ORDER — OSMOLITE 1.5 CAL PO LIQD: 1000.0000 mL | ORAL | Status: DC

## 2019-07-18 MED ORDER — DEXAMETHASONE SODIUM PHOSPHATE 10 MG/ML IJ SOLN
6.0000 mg | INTRAMUSCULAR | Status: DC
  Administered 2019-07-18 – 2019-07-24 (×7): 6 mg via INTRAVENOUS
  Filled 2019-07-18 (×7): qty 1

## 2019-07-18 MED ORDER — PRO-STAT SUGAR FREE PO LIQD
30.0000 mL | Freq: Two times a day (BID) | ORAL | Status: DC
  Administered 2019-07-18 – 2019-07-24 (×12): 30 mL
  Filled 2019-07-18 (×11): qty 30

## 2019-07-18 NOTE — Progress Notes (Signed)
PROGRESS NOTE    [facility]  [id number] DOB: Oct 20, 1940 DOA: 07/12/2019 PCP: [name], MD   Brief Narrative:  Deone Mather [facility] a 79 y.o.WM PMHx CVA, HTN, HLD, carotid stenosis s/p L CEA who   Presented to [facility] on 12/25 with fever and weakness. He initially had nasal congestion and scratchy throat, then developed nausea and decreased appetite. He presented to the ED after developing fevers, chills and generalized weakness. He's been treated at [facility] with steroids and remdesivir, but due to increasing O2 requirement, he was transferred to [facility]. Hospitalization complicated by visual hallucinations and the patient was seen by psychiatry.    Subjective: A/O x4, negative CP, negative abdominal pain.  Positive S OB   Assessment & Plan:   Active Problems:   COVID-19 virus infection  Covid pneumonia/acute respiratory failure with hypoxia COVID-19 Labs  Recent Labs    07/16/19 0255 07/17/19 0230 07/18/19 0125  DDIMER 0.58* 0.87* 0.98*  FERRITIN 1,188* 1,506* 1,642*  CRP 0.7 10.1* 10.0*    No results found for: SARSCOV2NAA -Decadron 6 mg daily -S/p Remdesivir;  -Procalcitonin elevated therefore not eligible for Actemra  HCAP -Cefepime x7 days -Trend procalcitonin  Acute on CKD stage III (baseline Cr 2 05/2019) Recent Labs  Lab 07/14/19 0353 07/15/19 0416 07/16/19 0255 07/17/19 0230 07/18/19 0125  CREATININE 1.84* 1.76* 1.60* 1.39* 1.30*  -Better than baseline, continue to monitor  Essential HTN -Amlodipine -Hold HCTZ and lisinopril given recent renal function  PVD/Hx CVA - continue aspirin, plavix, statin  Visual hallucinations/anxiety/panic attack -Seen by psychiatry at OSH who recommended low-dose Zyprexa PRN hallucinations -Currently negative hallucinations   Elevated LFT's: likely 2/2 COVID infection, follow.  Acute hepatitis panel negative.  Worsening liver function today - relatively stable.  Ultrasound is down, RUQ  [place] negative.  Continue to follow LFT's.  Poor p.o. intake -1/6 core track placed.  Feeding started  Depressed Mood:  -2/2 prolonged hospital stay, continue to monitor, chaplain   Continue home prednisolone acetate eye drops   DVT prophylaxis: Lovenox Code Status: Full Family Communication:  Disposition Plan:    Consultants:    Procedures/Significant Events:     I have personally reviewed and interpreted all radiology studies and my findings are as above.  VENTILATOR SETTINGS: Nasal cannula Flow; 2 L/min SPO2 96%   Cultures   Antimicrobials: Anti-infectives (From admission, onward)   Start     Dose/Rate Stop   07/17/19 1300  ceFEPIme (MAXIPIME) 2 g in sodium chloride 0.9 % 100 mL IVPB     2 g 200 mL/hr over 30 Minutes         Devices    LINES / TUBES:      Continuous Infusions: . ceFEPime (MAXIPIME) IV 2 g (07/18/19 0914)     Objective: Vitals:   07/17/19 2000 07/18/19 0039 07/18/19 0754 07/18/19 1154  BP: (!) 130/53 (!) 157/63 (!) 153/66 (!) 138/92  Pulse: 79 71 62 100  Resp: (!) 22 (!) 22 16 19   Temp: 98.3 F (36.8 C) 97.8 F (36.6 C)  98.4 F (36.9 C)  TempSrc: Oral Oral  Oral  SpO2: 95% 94% 93% 90%  Weight:      Height:        Intake/Output Summary (Last 24 hours) at 07/18/2019 1315 Last data filed at 07/18/2019 0914 Gross per 24 hour  Intake 580 ml  Output 350 ml  Net 230 ml   Filed Weights   07/12/19 1246  Weight: 92.1 kg  Examination:  General: A/O x4, positive acute respiratory distress Eyes: negative scleral hemorrhage, negative anisocoria, negative icterus ENT: Negative Runny nose, negative gingival bleeding, Neck:  Negative scars, masses, torticollis, lymphadenopathy, JVD Lungs: Decreased breath sounds bilaterally without wheezes or crackles Cardiovascular: Regular rate and rhythm without murmur gallop or rub normal S1 and S2 Abdomen: negative abdominal pain, nondistended, positive soft, bowel sounds, no rebound,  no ascites, no appreciable mass Extremities: No significant cyanosis, clubbing, or edema bilateral lower extremities Skin: Negative rashes, lesions, ulcers Psychiatric:  Negative depression, negative anxiety, negative fatigue, negative mania  Central nervous system:  Cranial nerves II through XII intact, tongue/uvula midline, all extremities muscle strength 5/5, sensation intact throughout, negative dysarthria, negative expressive aphasia, negative receptive aphasia. .     Data Reviewed: Care during the described time interval was provided by me .  I have reviewed this patient's available data, including medical history, events of note, physical examination, and all test results as part of my evaluation.   CBC: Recent Labs  Lab 07/14/19 0353 07/15/19 0416 07/16/19 0255 07/17/19 0230 07/18/19 0125  WBC 14.2* 14.9* 17.8* 9.7 8.8  NEUTROABS 11.4* 12.3* 15.9* 7.8* 7.4  HGB 10.5* 10.9* 11.1* 11.7* 11.7*  HCT 31.4* 32.9* 33.5* 34.7* 34.9*  MCV 93.2 93.5 91.5 92.8 92.3  PLT 400 395 396 333 [id number]   Basic Metabolic Panel: Recent Labs  Lab 07/14/19 0353 07/15/19 0416 07/16/19 0255 07/17/19 0230 07/18/19 0125  NA 140 140 140 139 138  K 4.5 4.5 4.5 4.1 4.5  CL 105 105 108 104 104  CO2 22 23 21* 24 22  GLUCOSE 155* 141* 121* 84 158*  BUN 80* 85* 85* 65* 60*  CREATININE 1.84* 1.76* 1.60* 1.39* 1.30*  CALCIUM 8.3* 8.3* 8.3* 8.0* 8.6*  MG 2.6* 2.7* 2.7* 2.4 2.4  PHOS 5.8* 5.2* 4.4 2.6 2.8   GFR: Estimated Creatinine Clearance: 46.4 mL/min (A) (by C-G formula based on SCr of 1.3 mg/dL (H)). Liver Function Tests: Recent Labs  Lab 07/14/19 0353 07/15/19 0416 07/16/19 0255 07/17/19 0230 07/18/19 0125  AST 53* 79* 65* 35 41  ALT 153* 215* 213* 150* 144*  ALKPHOS 52 53 50 47 55  BILITOT 0.9 0.6 0.8 1.1 0.6  PROT 6.1* 6.2* 6.0* 5.8* 6.3*  ALBUMIN 3.0* 2.9* 2.7* 2.6* 2.6*   No results for input(s): LIPASE, AMYLASE in the last 168 hours. No results for input(s): AMMONIA in the  last 168 hours. Coagulation Profile: No results for input(s): INR, PROTIME in the last 168 hours. Cardiac Enzymes: No results for input(s): CKTOTAL, CKMB, CKMBINDEX, TROPONINI in the last 168 hours. BNP (last 3 results) No results for input(s): PROBNP in the last 8760 hours. HbA1C: No results for input(s): HGBA1C in the last 72 hours. CBG: No results for input(s): GLUCAP in the last 168 hours. Lipid Profile: No results for input(s): CHOL, HDL, LDLCALC, TRIG, CHOLHDL, LDLDIRECT in the last 72 hours. Thyroid Function Tests: No results for input(s): TSH, T4TOTAL, FREET4, T3FREE, THYROIDAB in the last 72 hours. Anemia Panel: Recent Labs    07/17/19 0230 07/18/19 0125  FERRITIN 1,506* 1,642*   Urine analysis:    Component Value Date/Time   COLORURINE YELLOW 07/14/2012 0825   APPEARANCEUR CLEAR 07/14/2012 0825   LABSPEC 1.020 07/14/2012 0825   PHURINE 7.5 07/14/2012 0825   GLUCOSEU NEGATIVE 07/14/2012 0825   HGBUR NEGATIVE 07/14/2012 0825   BILIRUBINUR NEGATIVE 07/14/2012 0825   KETONESUR NEGATIVE 07/14/2012 0825   PROTEINUR NEGATIVE 07/14/2012 0825   UROBILINOGEN 0.2  07/14/2012 0825   NITRITE NEGATIVE 07/14/2012 0825   LEUKOCYTESUR NEGATIVE 07/14/2012 0825   Sepsis Labs: @LABRCNTIP (procalcitonin:4,lacticidven:4)  ) Recent Results (from the past 240 hour(s))  MRSA PCR Screening     Status: None   Collection Time: 07/17/19  5:41 PM   Specimen: Nasal Mucosa; Nasopharyngeal  Result Value Ref Range Status   MRSA by PCR NEGATIVE NEGATIVE Final    Comment:        The GeneXpert MRSA Assay (FDA approved for NASAL specimens only), is one component of a comprehensive MRSA colonization surveillance program. It is not intended to diagnose MRSA infection nor to guide or monitor treatment for MRSA infections. Performed at [facility], [place] [address] [place], [place] [address]          Radiology Studies: DG CHEST PORT 1 VIEW  Result Date:  07/18/2019 CLINICAL DATA:  Hypoxia EXAM: PORTABLE CHEST 1 VIEW COMPARISON:  07/17/2019 FINDINGS: Right upper lobe airspace disease unchanged. Left upper lobe airspace disease also unchanged. No effusion. Heart size within normal limits. IMPRESSION: Bilateral airspace disease unchanged. Electronically Signed   By: Franchot Gallo M.D.   On: 07/18/2019 08:42   DG CHEST PORT 1 VIEW  Result Date: 07/17/2019 CLINICAL DATA:  Pt hypoxic and desating. Covid + EXAM: PORTABLE CHEST 1 VIEW COMPARISON:  12/11/2019 FINDINGS: Bilateral upper lobe airspace disease is increased density compared to prior. No pleural fluid. No pneumothorax. No acute osseous abnormality. IMPRESSION: Increase in density of bilateral upper lobe airspace disease. Electronically Signed   By: Suzy Bouchard M.D.   On: 07/17/2019 10:18   US Abdomen Limited RUQ  Result Date: 07/17/2019 CLINICAL DATA:  79 year old male COVID-19.  Elevated liver enzymes. EXAM: ULTRASOUND ABDOMEN LIMITED RIGHT UPPER QUADRANT COMPARISON:  None. FINDINGS: Gallbladder: No gallstones or wall thickening visualized. No sonographic Murphy sign noted by sonographer. Common bile duct: Diameter: 3 millimeters, normal. Liver: Background liver echogenicity is within normal limits (image 22). No discrete liver lesion. No intrahepatic biliary ductal dilatation. Portal vein is patent on color Doppler imaging with normal direction of blood flow towards the liver. Other: Negative visible right kidney. IMPRESSION: Right upper quadrant ultrasound is within normal limits. Electronically Signed   By: Genevie Ann M.D.   On: 07/17/2019 09:47        Scheduled Meds: . amLODipine  10 mg Oral Daily  . ascorbic acid  500 mg Oral Daily  . aspirin  81 mg Oral Daily  . atorvastatin  20 mg Oral q1800  . clopidogrel  75 mg Oral Daily  . enoxaparin (LOVENOX) injection  40 mg Subcutaneous Q24H  . feeding supplement (ENSURE ENLIVE)  237 mL Oral TID BM  . prednisoLONE acetate  1 drop Both Eyes QID   . zinc sulfate  220 mg Oral Daily   Continuous Infusions: . ceFEPime (MAXIPIME) IV 2 g (07/18/19 0914)     LOS: 6 days   The patient is critically ill with multiple organ systems failure and requires high complexity decision making for assessment and support, frequent evaluation and titration of therapies, application of advanced monitoring technologies and extensive interpretation of multiple databases. Critical Care Time devoted to patient care services described in this note  Time spent: 40 minutes     Avarey Yaeger, Geraldo Docker, MD [facility] Pager [phone]  If 7PM-7AM, please contact night-coverage [email or web address] Password [email or web address] 07/18/2019, 1:15 PM

## 2019-07-18 NOTE — Progress Notes (Signed)
Nutrition Follow-up  DOCUMENTATION CODES:   Obesity unspecified  INTERVENTION:   Pt may continue to eat with Cortrak in place; encourage po intake  Tube Feeding:  Osmolite 1.5 at 20 ml/hr, goal rate 55 ml/hr Titrate by 10 mL q 8 hours until goal rate of 55 ml/hr Pro-Stat 30 mL BID Provides 113 g of protein, 2180 kcals and 1069 mL of free water  Continue Ensure Enlive po BID, each supplement provides 350 kcal and 20 grams of protein   NUTRITION DIAGNOSIS:   Increased nutrient needs related to catabolic [id number]) as evidenced by estimated needs.  Being addressed via TF   GOAL:   Patient will meet greater than or equal to 90% of their needs  Progressing   MONITOR:   PO intake, Supplement acceptance, Labs, Weight trends, I & O's  REASON FOR ASSESSMENT:   Consult Assessment of nutrition requirement/status  ASSESSMENT:   79 year old male with PMHx of HTN, HLD, hx CVA admitted 12/25 to [facility] with COVID-19 and transferred to [facility] on 12/31, also with AKI on CKD stage III, visual hallucinations.  12/25 Admit to [facility] 12/31 Transfer to [facility] 01/06 Cortrak placed  Cortrak placed today  Recorded po intake 0-75% of meals; 25% on average  Weight 92.1 kg on 12.31; no new weight since  No skin breakdown per RN skin assessment  Labs: Creatinine 1.30, BUN 60 Meds: Vitamin C, decadron, zinc sulfate   Diet Order:   Diet Order            DIET DYS 3 Room service appropriate? Yes; Fluid consistency: Thin  Diet effective now              EDUCATION NEEDS:   No education needs have been identified at this time  Skin:  Skin Assessment: Reviewed RN Assessment  Last BM:  1/4  Height:   Ht Readings from Last 1 Encounters:  07/12/19 5\' 4"  (1.626 m)    Weight:   Wt Readings from Last 1 Encounters:  07/12/19 92.1 kg    Ideal Body Weight:  59.1 kg  BMI:  Body mass index is 34.85 kg/m.  Estimated Nutritional Needs:   Kcal:   2100-2300  Protein:  110-120 grams  Fluid:  2-2.3 L/day   Kerman Passey MS, RDN, LDN, CNSC [phone] Pager  [phone] Weekend/On-Call Pager

## 2019-07-18 NOTE — Procedures (Signed)
Cortrak  Person Inserting Tube:  Deja Pisarski C, RD Tube Type:  Cortrak - 43 inches Tube Location:  Left nare Initial Placement:  Stomach Secured by: Bridle Technique Used to Measure Tube Placement:  Documented cm marking at nare/ corner of mouth Cortrak Secured At:  71 cm    Cortrak Tube Team Note:  Consult received to place a Cortrak feeding tube.   No x-ray is required. RN may begin using tube.   If the tube becomes dislodged please keep the tube and contact the Cortrak team at [email or web address] (password [email or web address]) for replacement.  If after hours and replacement cannot be delayed, place a NG tube and confirm placement with an abdominal x-ray.    Apryle Stowell RD, LDN, CNSC [phone] Pager [phone] After Hours Pager]   

## 2019-07-19 DIAGNOSIS — R748 Abnormal levels of other serum enzymes: Secondary | ICD-10-CM

## 2019-07-19 LAB — CBC WITH DIFFERENTIAL/PLATELET
Abs Immature Granulocytes: 0.14 10*3/uL — ABNORMAL HIGH (ref 0.00–0.07)
Basophils Absolute: 0 10*3/uL (ref 0.0–0.1)
Basophils Relative: 0 %
Eosinophils Absolute: 0 10*3/uL (ref 0.0–0.5)
Eosinophils Relative: 0 %
HCT: 31.9 % — ABNORMAL LOW (ref 39.0–52.0)
Hemoglobin: 10.4 g/dL — ABNORMAL LOW (ref 13.0–17.0)
Immature Granulocytes: 2 %
Lymphocytes Relative: 10 %
Lymphs Abs: 0.9 10*3/uL (ref 0.7–4.0)
MCH: 29.8 pg (ref 26.0–34.0)
MCHC: 32.6 g/dL (ref 30.0–36.0)
MCV: 91.4 fL (ref 80.0–100.0)
Monocytes Absolute: 0.2 10*3/uL (ref 0.1–1.0)
Monocytes Relative: 2 %
Neutro Abs: 7.8 10*3/uL — ABNORMAL HIGH (ref 1.7–7.7)
Neutrophils Relative %: 86 %
Platelets: 318 10*3/uL (ref 150–400)
RBC: 3.49 MIL/uL — ABNORMAL LOW (ref 4.22–5.81)
RDW: 12.6 % (ref 11.5–15.5)
WBC: 9 10*3/uL (ref 4.0–10.5)
nRBC: 0 % (ref 0.0–0.2)

## 2019-07-19 LAB — COMPREHENSIVE METABOLIC PANEL
ALT: 175 U/L — ABNORMAL HIGH (ref 0–44)
AST: 61 U/L — ABNORMAL HIGH (ref 15–41)
Albumin: 2.3 g/dL — ABNORMAL LOW (ref 3.5–5.0)
Alkaline Phosphatase: 50 U/L (ref 38–126)
Anion gap: 10 (ref 5–15)
BUN: 59 mg/dL — ABNORMAL HIGH (ref 8–23)
CO2: 23 mmol/L (ref 22–32)
Calcium: 8.8 mg/dL — ABNORMAL LOW (ref 8.9–10.3)
Chloride: 105 mmol/L (ref 98–111)
Creatinine, Ser: 1.3 mg/dL — ABNORMAL HIGH (ref 0.61–1.24)
GFR calc Af Amer: 60 mL/min — ABNORMAL LOW (ref >60)
GFR calc non Af Amer: 52 mL/min — ABNORMAL LOW (ref >60)
Glucose, Bld: 173 mg/dL — ABNORMAL HIGH (ref 70–99)
Potassium: 4.7 mmol/L (ref 3.5–5.1)
Sodium: 138 mmol/L (ref 135–145)
Total Bilirubin: 0.5 mg/dL (ref 0.3–1.2)
Total Protein: 5.9 g/dL — ABNORMAL LOW (ref 6.5–8.1)

## 2019-07-19 LAB — PHOSPHORUS
Phosphorus: 3.2 mg/dL (ref 2.5–4.6)
Phosphorus: 2.8 mg/dL (ref 2.5–4.6)

## 2019-07-19 LAB — GLUCOSE, CAPILLARY
Glucose-Capillary: 186 mg/dL — ABNORMAL HIGH (ref 70–99)
Glucose-Capillary: 167 mg/dL — ABNORMAL HIGH (ref 70–99)
Glucose-Capillary: 147 mg/dL — ABNORMAL HIGH (ref 70–99)
Glucose-Capillary: 134 mg/dL — ABNORMAL HIGH (ref 70–99)
Glucose-Capillary: 200 mg/dL — ABNORMAL HIGH (ref 70–99)

## 2019-07-19 LAB — HEMOGLOBIN A1C
Hgb A1c MFr Bld: 6.1 % — ABNORMAL HIGH (ref 4.8–5.6)
Mean Plasma Glucose: 128.37 mg/dL

## 2019-07-19 LAB — C-REACTIVE PROTEIN: CRP: 4 mg/dL — ABNORMAL HIGH (ref <1.0)

## 2019-07-19 LAB — PROCALCITONIN: Procalcitonin: 0.18 ng/mL

## 2019-07-19 LAB — MAGNESIUM
Magnesium: 2.1 mg/dL (ref 1.7–2.4)
Magnesium: 1.9 mg/dL (ref 1.7–2.4)

## 2019-07-19 LAB — D-DIMER, QUANTITATIVE: D-Dimer, Quant: 0.75 ug/mL-FEU — ABNORMAL HIGH (ref 0.00–0.50)

## 2019-07-19 MED ORDER — INSULIN ASPART 100 UNIT/ML ~~LOC~~ SOLN
0.0000 [IU] | SUBCUTANEOUS | Status: DC
  Administered 2019-07-19 (×2): 1 [IU] via SUBCUTANEOUS
  Administered 2019-07-19 – 2019-07-20 (×3): 2 [IU] via SUBCUTANEOUS
  Administered 2019-07-20: 17:00:00 1 [IU] via SUBCUTANEOUS
  Administered 2019-07-20 (×2): 2 [IU] via SUBCUTANEOUS
  Administered 2019-07-20: 05:00:00 1 [IU] via SUBCUTANEOUS
  Administered 2019-07-21: 01:00:00 3 [IU] via SUBCUTANEOUS
  Administered 2019-07-21: 09:00:00 1 [IU] via SUBCUTANEOUS
  Administered 2019-07-21: 3 [IU] via SUBCUTANEOUS
  Administered 2019-07-21: 12:00:00 1 [IU] via SUBCUTANEOUS
  Administered 2019-07-21 (×2): 2 [IU] via SUBCUTANEOUS
  Administered 2019-07-22 – 2019-07-24 (×12): 1 [IU] via SUBCUTANEOUS

## 2019-07-19 MED ORDER — HYDRALAZINE HCL 20 MG/ML IJ SOLN: 10.0000 mg | Freq: Four times a day (QID) | INTRAMUSCULAR | Status: DC | PRN

## 2019-07-19 MED ORDER — CLONIDINE HCL 0.1 MG PO TABS
0.1000 mg | ORAL_TABLET | Freq: Two times a day (BID) | ORAL | Status: DC
  Administered 2019-07-19 – 2019-07-21 (×5): 0.1 mg via ORAL
  Filled 2019-07-19 (×5): qty 1

## 2019-07-19 NOTE — Progress Notes (Addendum)
PROGRESS NOTE    Craig Avery [facility]  [id number] DOB: [birth year] DOA: 07/12/2019 PCP: [name], MD   Brief Narrative:  Craig Avery [facility] a 79 y.o.WM PMHx CVA, HTN, HLD, carotid stenosis s/p L CEA who   Presented to [facility] on 12/25 with fever and weakness. He initially had nasal congestion and scratchy throat, then developed nausea and decreased appetite. He presented to the ED after developing fevers, chills and generalized weakness. He's been treated at [facility] with steroids and remdesivir, but due to increasing O2 requirement, he was transferred to [facility]. Hospitalization complicated by visual hallucinations and the patient was seen by psychiatry.    Subjective: 1/7 A/O x4, negative CP, negative abdominal pain.  Positive S OB   Assessment & Plan:   Active Problems:   COVID-19 virus infection  Covid pneumonia/acute respiratory failure with hypoxia COVID-19 Labs  Recent Labs    07/17/19 0230 07/18/19 0125 07/19/19 0015  DDIMER 0.87* 0.98* 0.75*  FERRITIN 1,506* 1,642*  --   CRP 10.1* 10.0* 4.0*    No results found for: SARSCOV2NAA -Decadron 6 mg daily -S/p Remdesivir;  -Procalcitonin elevated therefore not eligible for Actemra  HCAP -Cefepime x7 days -Trend procalcitonin  Acute on CKD stage III (baseline Cr 2 05/2019) Recent Labs  Lab 07/15/19 0416 07/16/19 0255 07/17/19 0230 07/18/19 0125 07/19/19 0015  CREATININE 1.76* 1.60* 1.39* 1.30* 1.30*  -Better than baseline, continue to monitor  Essential HTN -Amlodipine 10 mg daily  -Clonidine 0.1 mg BID -Hydralazine IV PRN -Hold HCTZ and lisinopril given recent renal function  PVD/Hx CVA - continue aspirin, plavix, statin  Visual hallucinations/anxiety/panic attack -Seen by psychiatry at OSH who recommended low-dose Zyprexa PRN hallucinations -Currently negative hallucinations  Elevated LFTs -RUQ [place] negative -1/7 bumped up today, will monitor closely -1/7 discontinue Lipitor 20  mg until liver enzymes stabilize  Poor p.o. intake -1/6 core track placed.  Feeding started -1/7 sensitive SSI  Depressed Mood:  -2/2 prolonged hospital stay, continue to monitor, chaplain   Continue home prednisolone acetate eye drops   DVT prophylaxis: Lovenox Code Status: Full Family Communication: 07/19/2019 Spoke with Craig Avery (Daughter) counseled her on plan of care answered all questions Disposition Plan:    Consultants:    Procedures/Significant Events:     I have personally reviewed and interpreted all radiology studies and my findings are as above.  VENTILATOR SETTINGS: Nasal cannula 1/7 Flow; 2 L/min SPO2 96%   Cultures   Antimicrobials: Anti-infectives (From admission, onward)   Start     Dose/Rate Stop   07/17/19 1300  ceFEPIme (MAXIPIME) 2 g in sodium chloride 0.9 % 100 mL IVPB     2 g 200 mL/hr over 30 Minutes 07/23/19 2359       Devices    LINES / TUBES:      Continuous Infusions: . ceFEPime (MAXIPIME) IV Stopped (07/19/19 0735)  . feeding supplement (OSMOLITE 1.5 CAL) 30 mL/hr at 07/19/19 0235     Objective: Vitals:   07/19/19 0000 07/19/19 0400 07/19/19 0500 07/19/19 0731  BP: 139/62 (!) 142/67  (!) 161/65  Pulse: (!) 56 (!) 55  (!) 54  Resp: 17 16  20   Temp: 97.8 F (36.6 C) 97.9 F (36.6 C)  98.4 F (36.9 C)  TempSrc: Oral Oral  Oral  SpO2: 95% 94%  95%  Weight:   91.8 kg   Height:        Intake/Output Summary (Last 24 hours) at 07/19/2019 0813 Last data filed at 07/19/2019 0400 Gross  per 24 hour  Intake 1950.16 ml  Output 400 ml  Net 1550.16 ml   Filed Weights   07/12/19 1246 07/19/19 0500  Weight: 92.1 kg 91.8 kg   Physical Exam:  General: A/O x4, positive acute respiratory distress Eyes: negative scleral hemorrhage, negative anisocoria, negative icterus ENT: Negative Runny nose, negative gingival bleeding, Neck:  Negative scars, masses, torticollis, lymphadenopathy, JVD Lungs: Decreased breath sounds  bilaterally without wheezes or crackles Cardiovascular: Regular rate and rhythm without murmur gallop or rub normal S1 and S2 Abdomen: negative abdominal pain, nondistended, positive soft, bowel sounds, no rebound, no ascites, no appreciable mass Extremities: No significant cyanosis, clubbing, or edema bilateral lower extremities Skin: Negative rashes, lesions, ulcers Psychiatric:  Negative depression, negative anxiety, negative fatigue, negative mania  Central nervous system:  Cranial nerves II through XII intact, tongue/uvula midline, all extremities muscle strength 5/5, sensation intact throughout, negative dysarthria, negative expressive aphasia, negative receptive aphasia.  .     Data Reviewed: Care during the described time interval was provided by me .  I have reviewed this patient's available data, including medical history, events of note, physical examination, and all test results as part of my evaluation.   CBC: Recent Labs  Lab 07/15/19 0416 07/16/19 0255 07/17/19 0230 07/18/19 0125 07/19/19 0015  WBC 14.9* 17.8* 9.7 8.8 9.0  NEUTROABS 12.3* 15.9* 7.8* 7.4 7.8*  HGB 10.9* 11.1* 11.7* 11.7* 10.4*  HCT 32.9* 33.5* 34.7* 34.9* 31.9*  MCV 93.5 91.5 92.8 92.3 91.4  PLT 395 396 333 326 0000000   Basic Metabolic Panel: Recent Labs  Lab 07/15/19 0416 07/16/19 0255 07/17/19 0230 07/18/19 0125 07/18/19 1717 07/19/19 0015  NA 140 140 139 138  --  138  K 4.5 4.5 4.1 4.5  --  4.7  CL 105 108 104 104  --  105  CO2 23 21* 24 22  --  23  GLUCOSE 141* 121* 84 158*  --  173*  BUN 85* 85* 65* 60*  --  59*  CREATININE 1.76* 1.60* 1.39* 1.30*  --  1.30*  CALCIUM 8.3* 8.3* 8.0* 8.6*  --  8.8*  MG 2.7* 2.7* 2.4 2.4 2.2 2.1  PHOS 5.2* 4.4 2.6 2.8 2.8 3.2   GFR: Estimated Creatinine Clearance: 46.3 mL/min (A) (by C-G formula based on SCr of 1.3 mg/dL (H)). Liver Function Tests: Recent Labs  Lab 07/15/19 0416 07/16/19 0255 07/17/19 0230 07/18/19 0125 07/19/19 0015  AST 79*  65* 35 41 61*  ALT 215* 213* 150* 144* 175*  ALKPHOS 53 50 47 55 50  BILITOT 0.6 0.8 1.1 0.6 0.5  PROT 6.2* 6.0* 5.8* 6.3* 5.9*  ALBUMIN 2.9* 2.7* 2.6* 2.6* 2.3*   No results for input(s): LIPASE, AMYLASE in the last 168 hours. No results for input(s): AMMONIA in the last 168 hours. Coagulation Profile: No results for input(s): INR, PROTIME in the last 168 hours. Cardiac Enzymes: No results for input(s): CKTOTAL, CKMB, CKMBINDEX, TROPONINI in the last 168 hours. BNP (last 3 results) No results for input(s): PROBNP in the last 8760 hours. HbA1C: No results for input(s): HGBA1C in the last 72 hours. CBG: Recent Labs  Lab 07/18/19 2021 07/18/19 2332 07/19/19 0531 07/19/19 0722  GLUCAP 241* 288* 186* 167*   Lipid Profile: No results for input(s): CHOL, HDL, LDLCALC, TRIG, CHOLHDL, LDLDIRECT in the last 72 hours. Thyroid Function Tests: No results for input(s): TSH, T4TOTAL, FREET4, T3FREE, THYROIDAB in the last 72 hours. Anemia Panel: Recent Labs    07/17/19 0230 07/18/19  [place]*   Urine analysis:    Component Value Date/Time   COLORURINE YELLOW 07/14/2012 0825   APPEARANCEUR CLEAR 07/14/2012 0825   LABSPEC 1.020 07/14/2012 0825   PHURINE 7.5 07/14/2012 0825   GLUCOSEU NEGATIVE 07/14/2012 0825   HGBUR NEGATIVE 07/14/2012 0825   BILIRUBINUR NEGATIVE 07/14/2012 0825   KETONESUR NEGATIVE 07/14/2012 0825   PROTEINUR NEGATIVE 07/14/2012 0825   UROBILINOGEN 0.2 07/14/2012 0825   NITRITE NEGATIVE 07/14/2012 0825   LEUKOCYTESUR NEGATIVE 07/14/2012 0825   Sepsis Labs: @LABRCNTIP (procalcitonin:4,lacticidven:4)  ) Recent Results (from the past 240 hour(s))  MRSA PCR Screening     Status: None   Collection Time: 07/17/19  5:41 PM   Specimen: Nasal Mucosa; Nasopharyngeal  Result Value Ref Range Status   MRSA by PCR NEGATIVE NEGATIVE Final    Comment:        The GeneXpert MRSA Assay (FDA approved for NASAL specimens only), is one component of a  comprehensive MRSA colonization surveillance program. It is not intended to diagnose MRSA infection nor to guide or monitor treatment for MRSA infections. Performed at [facility], [place] [address], [place], [place] [address]          Radiology Studies: DG CHEST PORT 1 VIEW  Result Date: 07/18/2019 CLINICAL DATA:  Hypoxia EXAM: PORTABLE CHEST 1 VIEW COMPARISON:  07/17/2019 FINDINGS: Right upper lobe airspace disease unchanged. Left upper lobe airspace disease also unchanged. No effusion. Heart size within normal limits. IMPRESSION: Bilateral airspace disease unchanged. Electronically Signed   By: Franchot Gallo M.D.   On: 07/18/2019 08:42   DG CHEST PORT 1 VIEW  Result Date: 07/17/2019 CLINICAL DATA:  Pt hypoxic and desating. Covid + EXAM: PORTABLE CHEST 1 VIEW COMPARISON:  12/11/2019 FINDINGS: Bilateral upper lobe airspace disease is increased density compared to prior. No pleural fluid. No pneumothorax. No acute osseous abnormality. IMPRESSION: Increase in density of bilateral upper lobe airspace disease. Electronically Signed   By: Suzy Bouchard M.D.   On: 07/17/2019 10:18   US Abdomen Limited RUQ  Result Date: 07/17/2019 CLINICAL DATA:  79 year old male COVID-19.  Elevated liver enzymes. EXAM: ULTRASOUND ABDOMEN LIMITED RIGHT UPPER QUADRANT COMPARISON:  None. FINDINGS: Gallbladder: No gallstones or wall thickening visualized. No sonographic Murphy sign noted by sonographer. Common bile duct: Diameter: 3 millimeters, normal. Liver: Background liver echogenicity is within normal limits (image 22). No discrete liver lesion. No intrahepatic biliary ductal dilatation. Portal vein is patent on color Doppler imaging with normal direction of blood flow towards the liver. Other: Negative visible right kidney. IMPRESSION: Right upper quadrant ultrasound is within normal limits. Electronically Signed   By: Genevie Ann M.D.   On: 07/17/2019 09:47        Scheduled Meds: .  amLODipine  10 mg Oral Daily  . ascorbic acid  500 mg Oral Daily  . aspirin  81 mg Oral Daily  . atorvastatin  20 mg Oral q1800  . clopidogrel  75 mg Oral Daily  . dexamethasone (DECADRON) injection  6 mg Intravenous Q24H  . enoxaparin (LOVENOX) injection  40 mg Subcutaneous Q24H  . feeding supplement (ENSURE ENLIVE)  237 mL Oral TID BM  . feeding supplement (PRO-STAT SUGAR FREE 64)  30 mL Per Tube BID  . prednisoLONE acetate  1 drop Both Eyes QID  . zinc sulfate  220 mg Oral Daily   Continuous Infusions: . ceFEPime (MAXIPIME) IV Stopped (07/19/19 0735)  . feeding supplement (OSMOLITE 1.5 CAL) 30 mL/hr at 07/19/19 0235  LOS: 7 days   The patient is critically ill with multiple organ systems failure and requires high complexity decision making for assessment and support, frequent evaluation and titration of therapies, application of advanced monitoring technologies and extensive interpretation of multiple databases. Critical Care Time devoted to patient care services described in this note  Time spent: 40 minutes     , Geraldo Docker, MD [facility] Pager [phone]  If 7PM-7AM, please contact night-coverage [email or web address] Password [facility] 07/19/2019, 8:13 AM

## 2019-07-20 LAB — MAGNESIUM: Magnesium: 1.8 mg/dL (ref 1.7–2.4)

## 2019-07-20 LAB — COMPREHENSIVE METABOLIC PANEL
ALT: 169 U/L — ABNORMAL HIGH (ref 0–44)
AST: 49 U/L — ABNORMAL HIGH (ref 15–41)
Albumin: 2.5 g/dL — ABNORMAL LOW (ref 3.5–5.0)
Alkaline Phosphatase: 48 U/L (ref 38–126)
Anion gap: 12 (ref 5–15)
BUN: 63 mg/dL — ABNORMAL HIGH (ref 8–23)
CO2: 22 mmol/L (ref 22–32)
Calcium: 9 mg/dL (ref 8.9–10.3)
Chloride: 104 mmol/L (ref 98–111)
Creatinine, Ser: 1.18 mg/dL (ref 0.61–1.24)
GFR calc Af Amer: >60 mL/min (ref >60)
GFR calc non Af Amer: 58 mL/min — ABNORMAL LOW (ref >60)
Glucose, Bld: 188 mg/dL — ABNORMAL HIGH (ref 70–99)
Potassium: 4.8 mmol/L (ref 3.5–5.1)
Sodium: 138 mmol/L (ref 135–145)
Total Bilirubin: 0.9 mg/dL (ref 0.3–1.2)
Total Protein: 5.8 g/dL — ABNORMAL LOW (ref 6.5–8.1)

## 2019-07-20 LAB — CBC WITH DIFFERENTIAL/PLATELET
Abs Immature Granulocytes: 0.17 10*3/uL — ABNORMAL HIGH (ref 0.00–0.07)
Basophils Absolute: 0 10*3/uL (ref 0.0–0.1)
Basophils Relative: 0 %
Eosinophils Absolute: 0 10*3/uL (ref 0.0–0.5)
Eosinophils Relative: 0 %
HCT: 30 % — ABNORMAL LOW (ref 39.0–52.0)
Hemoglobin: 9.9 g/dL — ABNORMAL LOW (ref 13.0–17.0)
Immature Granulocytes: 2 %
Lymphocytes Relative: 10 %
Lymphs Abs: 1 10*3/uL (ref 0.7–4.0)
MCH: 30.7 pg (ref 26.0–34.0)
MCHC: 33 g/dL (ref 30.0–36.0)
MCV: 93.2 fL (ref 80.0–100.0)
Monocytes Absolute: 0.3 10*3/uL (ref 0.1–1.0)
Monocytes Relative: 3 %
Neutro Abs: 8.3 10*3/uL — ABNORMAL HIGH (ref 1.7–7.7)
Neutrophils Relative %: 85 %
Platelets: 298 10*3/uL (ref 150–400)
RBC: 3.22 MIL/uL — ABNORMAL LOW (ref 4.22–5.81)
RDW: 12.8 % (ref 11.5–15.5)
WBC: 9.7 10*3/uL (ref 4.0–10.5)
nRBC: 0 % (ref 0.0–0.2)

## 2019-07-20 LAB — C-REACTIVE PROTEIN: CRP: 1.1 mg/dL — ABNORMAL HIGH (ref <1.0)

## 2019-07-20 LAB — GLUCOSE, CAPILLARY
Glucose-Capillary: 121 mg/dL — ABNORMAL HIGH (ref 70–99)
Glucose-Capillary: 137 mg/dL — ABNORMAL HIGH (ref 70–99)
Glucose-Capillary: 157 mg/dL — ABNORMAL HIGH (ref 70–99)
Glucose-Capillary: 180 mg/dL — ABNORMAL HIGH (ref 70–99)
Glucose-Capillary: 186 mg/dL — ABNORMAL HIGH (ref 70–99)

## 2019-07-20 LAB — PHOSPHORUS: Phosphorus: 3.1 mg/dL (ref 2.5–4.6)

## 2019-07-20 LAB — D-DIMER, QUANTITATIVE: D-Dimer, Quant: 0.67 ug/mL-FEU — ABNORMAL HIGH (ref 0.00–0.50)

## 2019-07-20 NOTE — Progress Notes (Signed)
Physical Therapy Treatment Patient Details Name: Craig Avery MRN: [id number] DOB: [birth year] Today's Date: 07/20/2019    History of Present Illness 79 y.o. male with medical history significant of CVA, HTN, HLD, carotid stenosis s/p L CEA who presented to [facility] on 12/25 with fever and weakness.  He initially had nasal congestion and scratchy throat, then developed nausea and decreased appetite.  He presented to the ED after developing fevers, chills and generalized weakness.  He's been treated at [facility] with steroids and remdesivir, but due to increasing O2 requirement, he was transferred to [facility].  Hospitalization complicated by visual hallucinations and the patient was seen by psychiatry.        PT Comments    Pt tolerated this tx well. He looks very fatigued but states that he is ok and has had no pain. He now has cor track in place as he was not eating. Therapist attempted to educate on need to eat for sustenance and not so much pleasure but pt does not seem to understand this. Pt is needing min-mod a with all mobility this pm, he was able to ambulate in room approx 102 ft x 1 and 80 ft x 1 with min guard/min assist and no AD. Pt was on 3L/min via [place] and sats were in 90s at rest and min desat to 80% after ambulation. Pt takes very short time <56min to recover saturations with pursed lip breathing to high 80s and 90s.    Follow Up Recommendations  Home health PT;SNF;Supervision/Assistance - 24 hour     Equipment Recommendations  None recommended by PT    Recommendations for Other Services       Precautions / Restrictions Precautions Precautions: Fall Restrictions Weight Bearing Restrictions: No    Mobility  Bed Mobility Overal bed mobility: Needs Assistance Bed Mobility: Supine to Sit     Supine to sit: Supervision        Transfers Overall transfer level: Needs assistance Equipment used: None Transfers: Sit to/from Stand Sit to Stand: Min guard             Ambulation/Gait Ambulation/Gait assistance: Min guard Gait Distance (Feet): 102 Feet Assistive device: None Gait Pattern/deviations: Step-through pattern Gait velocity: decreased cadence   General Gait Details: ambulated 1 x 102 ft with min guard assist, 1 x 61ft with min guard assist some loses of balance nores but able to correct  w/ min guard assist min desat with ambulation is 80% but pt able to recover within 1 min to high 80s then 90s   Stairs             Wheelchair Mobility    Modified Rankin (Stroke Patients Only)       Balance Overall balance assessment: Needs assistance Sitting-balance support: Feet supported Sitting balance-Leahy Scale: Good Sitting balance - Comments: sits unsupported w/o LOB   Standing balance support: During functional activity Standing balance-Leahy Scale: Poor                              Cognition Arousal/Alertness: Lethargic Behavior During Therapy: WFL for tasks assessed/performed Overall Cognitive Status: Within Functional Limits for tasks assessed                                        Exercises      General Comments  Pertinent Vitals/Pain Pain Assessment: No/denies pain    Home Living                      Prior Function            PT Goals (current goals can now be found in the care plan section) Acute Rehab PT Goals Patient Stated Goal: states will do whatever therapists asks of him, he has been in no pain since admission PT Goal Formulation: With patient Time For Goal Achievement: 07/28/19 Potential to Achieve Goals: Fair Progress towards PT goals: Not progressing toward goals - comment(nutrition has been poor pt now has cor track in place)    Frequency    Min 3X/week      PT Plan Discharge plan needs to be updated    Co-evaluation              AM-PAC PT "6 Clicks" Mobility   Outcome Measure  Help needed turning from your back to your side  while in a flat bed without using bedrails?: A Little Help needed moving from lying on your back to sitting on the side of a flat bed without using bedrails?: A Little Help needed moving to and from a bed to a chair (including a wheelchair)?: A Little Help needed standing up from a chair using your arms (e.g., wheelchair or bedside chair)?: A Little Help needed to walk in hospital room?: A Little Help needed climbing 3-5 steps with a railing? : A Lot 6 Click Score: 17    End of Session Equipment Utilized During Treatment: Oxygen Activity Tolerance: Patient limited by fatigue;Patient limited by lethargy;Treatment limited secondary to medical complications (Comment) Patient left: in bed;Other (comment)(EOB w/ physician nurse notified) Nurse Communication: Mobility status;Other (comment)(post tx disposition) PT Visit Diagnosis: Other abnormalities of gait and mobility (R26.89);Muscle weakness (generalized) (M62.81)     Time: [id number] PT Time Calculation (min) (ACUTE ONLY): 24 min  Charges:  $Gait Training: 8-22 mins $Therapeutic Activity: 8-22 mins                     Horald Chestnut, PT    Delford Field 07/20/2019, 4:14 PM

## 2019-07-20 NOTE — Progress Notes (Signed)
Patient is very depressed.  Significantly worse than last night.

## 2019-07-20 NOTE — Progress Notes (Addendum)
PROGRESS NOTE    Craig Avery [facility]  [id number] DOB: 1941/04/30 DOA: 07/12/2019 PCP: [name], MD   Brief Narrative:  Craig Avery [facility] a 79 y.o.WM PMHx CVA, HTN, HLD, carotid stenosis s/p L CEA    Presented to [facility] on 12/25 with fever and weakness. He initially had nasal congestion and scratchy throat, then developed nausea and decreased appetite. He presented to the ED after developing fevers, chills and generalized weakness. He's been treated at [facility] with steroids and remdesivir, but due to increasing O2 requirement, he was transferred to [facility]. Hospitalization complicated by visual hallucinations and the patient was seen by psychiatry.    Subjective: 1/9 afebrile last 24 hours A/O x4, negative CP, negative abdominal pain, positive S OB.  Refuses to eat food secondary to being cold when he gets it.  Feels that when he pages for help takes to 30 minutes prior to RN showing up .    Assessment & Plan:   Active Problems:   COVID-19 virus infection  Covid pneumonia/acute respiratory failure with hypoxia COVID-19 Labs  Recent Labs    07/18/19 0125 07/19/19 0015 07/20/19 0020  DDIMER 0.98* 0.75* 0.67*  FERRITIN 1,642*  --   --   CRP 10.0* 4.0* 1.1*    No results found for: SARSCOV2NAA -Decadron 6 mg daily -S/p Remdesivir;  -Procalcitonin elevated therefore not eligible for Actemra  HCAP -Cefepime x7 days -Trend procalcitonin  Acute on CKD stage III (baseline Cr 2 05/2019) Recent Labs  Lab 07/16/19 0255 07/17/19 0230 07/18/19 0125 07/19/19 0015 07/20/19 0020  CREATININE 1.60* 1.39* 1.30* 1.30* 1.18  -Better than baseline, continue to monitor  Essential HTN -Amlodipine 10 mg daily  -Hydralazine IV PRN -1/9 increase Clonidine 0.1 mg  TID -Hold HCTZ and lisinopril given recent renal function  PVD/Hx CVA - continue aspirin, plavix, statin  Visual hallucinations/anxiety/panic attack -Seen by psychiatry at OSH who recommended low-dose  Zyprexa PRN hallucinations -Currently negative hallucinations  Elevated LFTs -RUQ [place] negative -1/7 bumped up today, will monitor closely -1/7 discontinue Lipitor 20 mg until liver enzymes stabilize  Poor p.o. intake -1/6 core track placed.  Feeding started -1/7 sensitive SSI -1/8 patient refusing to eat becoming extremely agitated when I explained he needed the caloric intake. -1/9 patient continues to refuse to eat p.o. food will contact family to see if they would be willing to bring patient food from home.  Depressed Mood:  -2/2 prolonged hospital stay, continue to monitor, chaplain  -1/9 change Zyprexa 2.5 mg daily -1/9 Celexa 10 mg daily -1/10 TSH= pending   Continue home prednisolone acetate eye drops   Anemia -1/9 anemia panel pending -1/9 occult blood pending  Goals of care -PT; recommends SNF.  OT recommendation pending -1/9 consult LCSW; evaluate for SNF   DVT prophylaxis: Lovenox Code Status: Full Family Communication: 07/19/2019 Spoke with Craig Avery (Daughter) counseled her on plan of care answered all questions Disposition Plan:    Consultants:    Procedures/Significant Events:     I have personally reviewed and interpreted all radiology studies and my findings are as above.  VENTILATOR SETTINGS: Room air 1/9 SPO2 90%   Cultures   Antimicrobials: Anti-infectives (From admission, onward)   Start     Dose/Rate Stop   07/17/19 1300  ceFEPIme (MAXIPIME) 2 g in sodium chloride 0.9 % 100 mL IVPB     2 g 200 mL/hr over 30 Minutes 07/23/19 2359       Devices    LINES / TUBES:  Continuous Infusions: . ceFEPime (MAXIPIME) IV 2 g (07/20/19 1007)  . feeding supplement (OSMOLITE 1.5 CAL) 1,000 mL (07/20/19 1009)     Objective: Vitals:   07/20/19 0443 07/20/19 0500 07/20/19 0754 07/20/19 1137  BP: (!) 145/58  (!) 155/56 (!) 147/47  Pulse: (!) 55  (!) 56 (!) 57  Resp: (!) 22  (!) 21 (!) 21  Temp: 97.6 F (36.4 C)  97.8 F (36.6 C) (!)  78 F (25.6 C)  TempSrc: Oral  Oral Oral  SpO2: 93%  94% 93%  Weight:  92 kg    Height:        Intake/Output Summary (Last 24 hours) at 07/20/2019 1440 Last data filed at 07/20/2019 0645 Gross per 24 hour  Intake 1411.67 ml  Output 350 ml  Net 1061.67 ml   Filed Weights   07/12/19 1246 07/19/19 0500 07/20/19 0500  Weight: 92.1 kg 91.8 kg 92 kg    Physical Exam:  General: A/O x4, no acute respiratory distress-still refusing to eat p.o. food from hospital. Eyes: negative scleral hemorrhage, negative anisocoria, negative icterus ENT: Negative Runny nose, negative gingival bleeding, Neck:  Negative scars, masses, torticollis, lymphadenopathy, JVD Lungs: Clear to auscultation bilaterally without wheezes or crackles Cardiovascular: Bradycardic without murmur gallop or rub normal S1 and S2 Abdomen: negative abdominal pain, nondistended, positive soft, bowel sounds, no rebound, no ascites, no appreciable mass Extremities: No significant cyanosis, clubbing, or edema bilateral lower extremities Skin: Negative rashes, lesions, ulcers Psychiatric: Positive depression (chronic) secondary to death of wife and have not been in hospital, Positiveanxiety, negative fatigue, negative mania  Central nervous system:  Cranial nerves II through XII intact, tongue/uvula midline, all extremities muscle strength 5/5, sensation intact throughout, negative dysarthria, negative expressive aphasia, negative receptive aphasia   Data Reviewed: Care during the described time interval was provided by me .  I have reviewed this patient's available data, including medical history, events of note, physical examination, and all test results as part of my evaluation.   CBC: Recent Labs  Lab 07/16/19 0255 07/17/19 0230 07/18/19 0125 07/19/19 0015 07/20/19 0020  WBC 17.8* 9.7 8.8 9.0 9.7  NEUTROABS 15.9* 7.8* 7.4 7.8* 8.3*  HGB 11.1* 11.7* 11.7* 10.4* 9.9*  HCT 33.5* 34.7* 34.9* 31.9* 30.0*  MCV 91.5 92.8 92.3  91.4 93.2  PLT 396 333 326 318 [id number]   Basic Metabolic Panel: Recent Labs  Lab 07/16/19 0255 07/17/19 0230 07/18/19 0125 07/18/19 1717 07/19/19 0015 07/19/19 1637 07/20/19 0020  NA 140 139 138  --  138  --  138  K 4.5 4.1 4.5  --  4.7  --  4.8  CL 108 104 104  --  105  --  104  CO2 21* 24 22  --  23  --  22  GLUCOSE 121* 84 158*  --  173*  --  188*  BUN 85* 65* 60*  --  59*  --  63*  CREATININE 1.60* 1.39* 1.30*  --  1.30*  --  1.18  CALCIUM 8.3* 8.0* 8.6*  --  8.8*  --  9.0  MG 2.7* 2.4 2.4 2.2 2.1 1.9 1.8  PHOS 4.4 2.6 2.8 2.8 3.2 2.8 3.1   GFR: Estimated Creatinine Clearance: 51.1 mL/min (by C-G formula based on SCr of 1.18 mg/dL). Liver Function Tests: Recent Labs  Lab 07/16/19 0255 07/17/19 0230 07/18/19 0125 07/19/19 0015 07/20/19 0020  AST 65* 35 41 61* 49*  ALT 213* 150* 144* 175* 169*  ALKPHOS 50 47 55  50 48  BILITOT 0.8 1.1 0.6 0.5 0.9  PROT 6.0* 5.8* 6.3* 5.9* 5.8*  ALBUMIN 2.7* 2.6* 2.6* 2.3* 2.5*   No results for input(s): LIPASE, AMYLASE in the last 168 hours. No results for input(s): AMMONIA in the last 168 hours. Coagulation Profile: No results for input(s): INR, PROTIME in the last 168 hours. Cardiac Enzymes: No results for input(s): CKTOTAL, CKMB, CKMBINDEX, TROPONINI in the last 168 hours. BNP (last 3 results) No results for input(s): PROBNP in the last 8760 hours. HbA1C: Recent Labs    07/19/19 0015  HGBA1C 6.1*   CBG: Recent Labs  Lab 07/19/19 1942 07/19/19 2356 07/20/19 0440 07/20/19 0751 07/20/19 1120  GLUCAP 200* 186* 137* 157* 121*   Lipid Profile: No results for input(s): CHOL, HDL, LDLCALC, TRIG, CHOLHDL, LDLDIRECT in the last 72 hours. Thyroid Function Tests: No results for input(s): TSH, T4TOTAL, FREET4, T3FREE, THYROIDAB in the last 72 hours. Anemia Panel: Recent Labs    07/18/19 0125  FERRITIN 1,642*   Urine analysis:    Component Value Date/Time   COLORURINE YELLOW 07/14/2012 0825   APPEARANCEUR CLEAR  07/14/2012 0825   LABSPEC 1.020 07/14/2012 0825   PHURINE 7.5 07/14/2012 0825   GLUCOSEU NEGATIVE 07/14/2012 0825   HGBUR NEGATIVE 07/14/2012 0825   BILIRUBINUR NEGATIVE 07/14/2012 0825   KETONESUR NEGATIVE 07/14/2012 0825   PROTEINUR NEGATIVE 07/14/2012 0825   UROBILINOGEN 0.2 07/14/2012 0825   NITRITE NEGATIVE 07/14/2012 0825   LEUKOCYTESUR NEGATIVE 07/14/2012 0825   Sepsis Labs: @LABRCNTIP (procalcitonin:4,lacticidven:4)  ) Recent Results (from the past 240 hour(s))  MRSA PCR Screening     Status: None   Collection Time: 07/17/19  5:41 PM   Specimen: Nasal Mucosa; Nasopharyngeal  Result Value Ref Range Status   MRSA by PCR NEGATIVE NEGATIVE Final    Comment:        The GeneXpert MRSA Assay (FDA approved for NASAL specimens only), is one component of a comprehensive MRSA colonization surveillance program. It is not intended to diagnose MRSA infection nor to guide or monitor treatment for MRSA infections. Performed at [facility], [place] [address], [place], [place] [address]          Radiology Studies: No results found.      Scheduled Meds: . amLODipine  10 mg Oral Daily  . ascorbic acid  500 mg Oral Daily  . aspirin  81 mg Oral Daily  . cloNIDine  0.1 mg Oral BID  . clopidogrel  75 mg Oral Daily  . dexamethasone (DECADRON) injection  6 mg Intravenous Q24H  . enoxaparin (LOVENOX) injection  40 mg Subcutaneous Q24H  . feeding supplement (ENSURE ENLIVE)  237 mL Oral TID BM  . feeding supplement (PRO-STAT SUGAR FREE 64)  30 mL Per Tube BID  . insulin aspart  0-9 Units Subcutaneous Q4H  . prednisoLONE acetate  1 drop Both Eyes QID  . zinc sulfate  220 mg Oral Daily   Continuous Infusions: . ceFEPime (MAXIPIME) IV 2 g (07/20/19 1007)  . feeding supplement (OSMOLITE 1.5 CAL) 1,000 mL (07/20/19 1009)     LOS: 8 days   The patient is critically ill with multiple organ systems failure and requires high complexity decision making for  assessment and support, frequent evaluation and titration of therapies, application of advanced monitoring technologies and extensive interpretation of multiple databases. Critical Care Time devoted to patient care services described in this note  Time spent: 40 minutes     Darwyn Ponzo, Geraldo Docker, MD [facility] Pager  [phone]  If 7PM-7AM, please contact night-coverage [email or web address] Password [email or web address] 07/20/2019, 2:40 PM

## 2019-07-20 NOTE — Progress Notes (Signed)
Pharmacy Antibiotic Note  LENZIE BARBIERI is a 79 y.o. male admitted on 07/12/2019 with acute respiratory failure due to COVID-19 infection.  Pharmacy has been consulted for cefepime dosing for possible superimposed bacterial PNA.  D#4, procal downtrend to 0.18, SCr 1.18, CrCl~ 51 ml/min, stop date in for 7 days tx  Plan: Continue Cefepime 2g Iv every 12 hours Monitor renal function, sputum Cx, LOT  Height: 5\' 4"  (162.6 cm) Weight: 202 lb 13.2 oz (92 kg) IBW/kg (Calculated) : 59.2  Temp (24hrs), Avg:94.3 F (34.6 C), Min:78 F (25.6 C), Max:97.8 F (36.6 C)  Recent Labs  Lab 07/16/19 0255 07/17/19 0230 07/18/19 0125 07/19/19 0015 07/20/19 0020  WBC 17.8* 9.7 8.8 9.0 9.7  CREATININE 1.60* 1.39* 1.30* 1.30* 1.18    Estimated Creatinine Clearance: 51.1 mL/min (by C-G formula based on SCr of 1.18 mg/dL).    No Known Allergies  Antimicrobials this admission: 12/25 remdesivir >> 12/29 1/5 cefepime >>   Microbiology results: 1/5 Sputum:  sent 1/5 MRSA PCR: negative  Bertis Ruddy, PharmD Clinical Pharmacist Please check AMION for all [place] numbers 07/20/2019 1:49 PM

## 2019-07-20 NOTE — Plan of Care (Signed)
Plan of Care reviewed. 

## 2019-07-21 DIAGNOSIS — I10 Essential (primary) hypertension: Secondary | ICD-10-CM | POA: Diagnosis present

## 2019-07-21 DIAGNOSIS — F410 Panic disorder [episodic paroxysmal anxiety] without agoraphobia: Secondary | ICD-10-CM | POA: Diagnosis present

## 2019-07-21 DIAGNOSIS — N179 Acute kidney failure, unspecified: Secondary | ICD-10-CM | POA: Diagnosis present

## 2019-07-21 DIAGNOSIS — J1282 Pneumonia due to coronavirus disease 2019: Secondary | ICD-10-CM | POA: Diagnosis present

## 2019-07-21 DIAGNOSIS — F32A Depression, unspecified: Secondary | ICD-10-CM | POA: Diagnosis present

## 2019-07-21 DIAGNOSIS — J189 Pneumonia, unspecified organism: Secondary | ICD-10-CM | POA: Diagnosis not present

## 2019-07-21 DIAGNOSIS — U071 COVID-19: Secondary | ICD-10-CM | POA: Diagnosis present

## 2019-07-21 DIAGNOSIS — J9601 Acute respiratory failure with hypoxia: Secondary | ICD-10-CM | POA: Diagnosis present

## 2019-07-21 DIAGNOSIS — F419 Anxiety disorder, unspecified: Secondary | ICD-10-CM | POA: Diagnosis present

## 2019-07-21 DIAGNOSIS — R441 Visual hallucinations: Secondary | ICD-10-CM | POA: Diagnosis present

## 2019-07-21 DIAGNOSIS — F329 Major depressive disorder, single episode, unspecified: Secondary | ICD-10-CM | POA: Diagnosis present

## 2019-07-21 DIAGNOSIS — R7989 Other specified abnormal findings of blood chemistry: Secondary | ICD-10-CM | POA: Diagnosis present

## 2019-07-21 HISTORY — DX: Pneumonia, unspecified organism: J18.9

## 2019-07-21 LAB — CBC WITH DIFFERENTIAL/PLATELET
Abs Immature Granulocytes: 0.28 10*3/uL — ABNORMAL HIGH (ref 0.00–0.07)
Basophils Absolute: 0 10*3/uL (ref 0.0–0.1)
Basophils Relative: 0 %
Eosinophils Absolute: 0 10*3/uL (ref 0.0–0.5)
Eosinophils Relative: 0 %
HCT: 29.2 % — ABNORMAL LOW (ref 39.0–52.0)
Hemoglobin: 9.8 g/dL — ABNORMAL LOW (ref 13.0–17.0)
Immature Granulocytes: 3 %
Lymphocytes Relative: 12 %
Lymphs Abs: 1.3 10*3/uL (ref 0.7–4.0)
MCH: 31.3 pg (ref 26.0–34.0)
MCHC: 33.6 g/dL (ref 30.0–36.0)
MCV: 93.3 fL (ref 80.0–100.0)
Monocytes Absolute: 0.5 10*3/uL (ref 0.1–1.0)
Monocytes Relative: 4 %
Neutro Abs: 9.1 10*3/uL — ABNORMAL HIGH (ref 1.7–7.7)
Neutrophils Relative %: 81 %
Platelets: 280 10*3/uL (ref 150–400)
RBC: 3.13 MIL/uL — ABNORMAL LOW (ref 4.22–5.81)
RDW: 13 % (ref 11.5–15.5)
WBC: 11.2 10*3/uL — ABNORMAL HIGH (ref 4.0–10.5)
nRBC: 0 % (ref 0.0–0.2)

## 2019-07-21 LAB — COMPREHENSIVE METABOLIC PANEL
ALT: 148 U/L — ABNORMAL HIGH (ref 0–44)
AST: 40 U/L (ref 15–41)
Albumin: 2.4 g/dL — ABNORMAL LOW (ref 3.5–5.0)
Alkaline Phosphatase: 47 U/L (ref 38–126)
Anion gap: 10 (ref 5–15)
BUN: 64 mg/dL — ABNORMAL HIGH (ref 8–23)
CO2: 24 mmol/L (ref 22–32)
Calcium: 8.9 mg/dL (ref 8.9–10.3)
Chloride: 105 mmol/L (ref 98–111)
Creatinine, Ser: 1.21 mg/dL (ref 0.61–1.24)
GFR calc Af Amer: >60 mL/min (ref >60)
GFR calc non Af Amer: 56 mL/min — ABNORMAL LOW (ref >60)
Glucose, Bld: 169 mg/dL — ABNORMAL HIGH (ref 70–99)
Potassium: 4.7 mmol/L (ref 3.5–5.1)
Sodium: 139 mmol/L (ref 135–145)
Total Bilirubin: 0.4 mg/dL (ref 0.3–1.2)
Total Protein: 5.5 g/dL — ABNORMAL LOW (ref 6.5–8.1)

## 2019-07-21 LAB — GLUCOSE, CAPILLARY
Glucose-Capillary: 130 mg/dL — ABNORMAL HIGH (ref 70–99)
Glucose-Capillary: 134 mg/dL — ABNORMAL HIGH (ref 70–99)
Glucose-Capillary: 155 mg/dL — ABNORMAL HIGH (ref 70–99)
Glucose-Capillary: 156 mg/dL — ABNORMAL HIGH (ref 70–99)
Glucose-Capillary: 210 mg/dL — ABNORMAL HIGH (ref 70–99)
Glucose-Capillary: 221 mg/dL — ABNORMAL HIGH (ref 70–99)

## 2019-07-21 LAB — C-REACTIVE PROTEIN: CRP: 0.6 mg/dL (ref <1.0)

## 2019-07-21 LAB — D-DIMER, QUANTITATIVE: D-Dimer, Quant: 0.75 ug/mL-FEU — ABNORMAL HIGH (ref 0.00–0.50)

## 2019-07-21 MED ORDER — OLANZAPINE 2.5 MG PO TABS: 2.5000 mg | ORAL_TABLET | Freq: Three times a day (TID) | ORAL | Status: DC

## 2019-07-21 MED ORDER — OLANZAPINE 2.5 MG PO TABS
2.5000 mg | ORAL_TABLET | Freq: Every day | ORAL | Status: DC
  Administered 2019-07-21 – 2019-07-24 (×4): 2.5 mg via ORAL
  Filled 2019-07-21 (×4): qty 1

## 2019-07-21 MED ORDER — CITALOPRAM HYDROBROMIDE 10 MG PO TABS
10.0000 mg | ORAL_TABLET | Freq: Every day | ORAL | Status: DC
  Administered 2019-07-21 – 2019-07-24 (×4): 10 mg via ORAL
  Filled 2019-07-21 (×4): qty 1

## 2019-07-21 MED ORDER — CLONIDINE HCL 0.1 MG PO TABS
0.1000 mg | ORAL_TABLET | Freq: Three times a day (TID) | ORAL | Status: DC
  Administered 2019-07-21 – 2019-07-24 (×9): 0.1 mg via ORAL
  Filled 2019-07-21 (×9): qty 1

## 2019-07-21 NOTE — Plan of Care (Signed)
Plan of Care reviewed. 

## 2019-07-21 NOTE — Progress Notes (Addendum)
PROGRESS NOTE    Liev Chalupa [facility]  [id number] DOB: May 15, 1941 DOA: 07/12/2019 PCP: [name], MD   Brief Narrative:  Craig Avery [facility] a 79 y.o.WM PMHx CVA, HTN, HLD, carotid stenosis s/p L CEA    Presented to [facility] on 12/25 with fever and weakness. He initially had nasal congestion and scratchy throat, then developed nausea and decreased appetite. He presented to the ED after developing fevers, chills and generalized weakness. He's been treated at [facility] with steroids and remdesivir, but due to increasing O2 requirement, he was transferred to [facility]. Hospitalization complicated by visual hallucinations and the patient was seen by psychiatry.    Subjective: 1/9 afebrile last 24 hours A/O x4, negative CP, negative abdominal pain, positive S OB.  Refuses to eat food secondary to being cold when he gets it.  Feels that when he pages for help takes to 30 minutes prior to RN showing up .    Assessment & Plan:   Active Problems:   COVID-19 virus infection   Pneumonia due to COVID-19 virus   Acute respiratory failure with hypoxia ([place])   HCAP (healthcare-associated pneumonia)   Acute renal failure superimposed on stage 3b chronic kidney disease ([place])   Essential hypertension   Visual hallucination   Anxiety   Panic attack   Elevated LFTs   Depression  Covid pneumonia/acute respiratory failure with hypoxia COVID-19 Labs  Recent Labs    07/19/19 0015 07/20/19 0020 07/21/19 0449  DDIMER 0.75* 0.67* 0.75*  CRP 4.0* 1.1* 0.6    No results found for: SARSCOV2NAA -Decadron 6 mg daily -S/p Remdesivir;  -Procalcitonin elevated therefore not eligible for Actemra  HCAP -Cefepime x7 days -Trend procalcitonin  Acute on CKD stage III (baseline Cr 2 05/2019) Recent Labs  Lab 07/17/19 0230 07/18/19 0125 07/19/19 0015 07/20/19 0020 07/21/19 0449  CREATININE 1.39* 1.30* 1.30* 1.18 1.21  -Better than baseline, continue to monitor  Essential  HTN -Amlodipine 10 mg daily  -Hydralazine IV PRN -1/9 increase Clonidine 0.1 mg  TID -Hold HCTZ and lisinopril given recent renal function  PVD/Hx CVA - continue aspirin, plavix, statin  Visual hallucinations/anxiety/panic attack -Seen by psychiatry at OSH who recommended low-dose Zyprexa PRN hallucinations -Currently negative hallucinations  Elevated LFTs -RUQ [place] negative -1/7 bumped up today, will monitor closely -1/7 discontinue Lipitor 20 mg until liver enzymes stabilize  Poor p.o. intake -1/6 core track placed.  Feeding started -1/7 sensitive SSI -1/8 patient refusing to eat becoming extremely agitated when I explained he needed the caloric intake. -1/9 patient continues to refuse to eat p.o. food will contact family to see if they would be willing to bring patient food from home.  Depressed Mood:  -2/2 prolonged hospital stay, continue to monitor, chaplain  -1/9 change Zyprexa 2.5 mg daily -1/9 Celexa 10 mg daily -1/10 TSH= pending   Continue home prednisolone acetate eye drops   Anemia -1/9 anemia panel pending -1/9 occult blood pending  Goals of care -PT; recommends SNF.  OT recommendation pending -1/9 consult LCSW; evaluate for SNF   DVT prophylaxis: Lovenox Code Status: Full Family Communication: 07/21/2019 Spoke with Collie Siad (Daughter) counseled her on plan of care answered all questions Disposition Plan:    Consultants:    Procedures/Significant Events:     I have personally reviewed and interpreted all radiology studies and my findings are as above.  VENTILATOR SETTINGS: Room air 1/9 SPO2 90%   Cultures   Antimicrobials: Anti-infectives (From admission, onward)   Start     Dose/Rate  Stop   07/17/19 1300  ceFEPIme (MAXIPIME) 2 g in sodium chloride 0.9 % 100 mL IVPB     2 g 200 mL/hr over 30 Minutes 07/23/19 2359       Devices    LINES / TUBES:      Continuous Infusions: . ceFEPime (MAXIPIME) IV 2 g (07/21/19 0952)  .  feeding supplement (OSMOLITE 1.5 CAL) 1,000 mL (07/21/19 0947)     Objective: Vitals:   07/21/19 0500 07/21/19 0501 07/21/19 0822 07/21/19 1206  BP: (!) 150/61  (!) 166/55 (!) 163/63  Pulse: (!) 56  62 79  Resp: (!) 22  19 20   Temp: 98.2 F (36.8 C)  (!) 97.3 F (36.3 C)   TempSrc: Oral  Oral   SpO2: 93%  92% 90%  Weight:  91.4 kg    Height:        Intake/Output Summary (Last 24 hours) at 07/21/2019 1524 Last data filed at 07/21/2019 0600 Gross per 24 hour  Intake 1970 ml  Output 975 ml  Net 995 ml   Filed Weights   07/19/19 0500 07/20/19 0500 07/21/19 0501  Weight: 91.8 kg 92 kg 91.4 kg    Physical Exam:  General: A/O x4, no acute respiratory distress-still refusing to eat p.o. food from hospital. Eyes: negative scleral hemorrhage, negative anisocoria, negative icterus ENT: Negative Runny nose, negative gingival bleeding, Neck:  Negative scars, masses, torticollis, lymphadenopathy, JVD Lungs: Clear to auscultation bilaterally without wheezes or crackles Cardiovascular: Bradycardic without murmur gallop or rub normal S1 and S2 Abdomen: negative abdominal pain, nondistended, positive soft, bowel sounds, no rebound, no ascites, no appreciable mass Extremities: No significant cyanosis, clubbing, or edema bilateral lower extremities Skin: Negative rashes, lesions, ulcers Psychiatric: Positive depression (chronic) secondary to death of wife and have not been in hospital, Positiveanxiety, negative fatigue, negative mania  Central nervous system:  Cranial nerves II through XII intact, tongue/uvula midline, all extremities muscle strength 5/5, sensation intact throughout, negative dysarthria, negative expressive aphasia, negative receptive aphasia   Data Reviewed: Care during the described time interval was provided by me .  I have reviewed this patient's available data, including medical history, events of note, physical examination, and all test results as part of my evaluation.    CBC: Recent Labs  Lab 07/17/19 0230 07/18/19 0125 07/19/19 0015 07/20/19 0020 07/21/19 0449  WBC 9.7 8.8 9.0 9.7 11.2*  NEUTROABS 7.8* 7.4 7.8* 8.3* 9.1*  HGB 11.7* 11.7* 10.4* 9.9* 9.8*  HCT 34.7* 34.9* 31.9* 30.0* 29.2*  MCV 92.8 92.3 91.4 93.2 93.3  PLT 333 326 318 298 [id number]   Basic Metabolic Panel: Recent Labs  Lab 07/17/19 0230 07/18/19 0125 07/18/19 1717 07/19/19 0015 07/19/19 1637 07/20/19 0020 07/21/19 0449  NA 139 138  --  138  --  138 139  K 4.1 4.5  --  4.7  --  4.8 4.7  CL 104 104  --  105  --  104 105  CO2 24 22  --  23  --  22 24  GLUCOSE 84 158*  --  173*  --  188* 169*  BUN 65* 60*  --  59*  --  63* 64*  CREATININE 1.39* 1.30*  --  1.30*  --  1.18 1.21  CALCIUM 8.0* 8.6*  --  8.8*  --  9.0 8.9  MG 2.4 2.4 2.2 2.1 1.9 1.8  --   PHOS 2.6 2.8 2.8 3.2 2.8 3.1  --    GFR: Estimated Creatinine Clearance: 49.7  mL/min (by C-G formula based on SCr of 1.21 mg/dL). Liver Function Tests: Recent Labs  Lab 07/17/19 0230 07/18/19 0125 07/19/19 0015 07/20/19 0020 07/21/19 0449  AST 35 41 61* 49* 40  ALT 150* 144* 175* 169* 148*  ALKPHOS 47 55 50 48 47  BILITOT 1.1 0.6 0.5 0.9 0.4  PROT 5.8* 6.3* 5.9* 5.8* 5.5*  ALBUMIN 2.6* 2.6* 2.3* 2.5* 2.4*   No results for input(s): LIPASE, AMYLASE in the last 168 hours. No results for input(s): AMMONIA in the last 168 hours. Coagulation Profile: No results for input(s): INR, PROTIME in the last 168 hours. Cardiac Enzymes: No results for input(s): CKTOTAL, CKMB, CKMBINDEX, TROPONINI in the last 168 hours. BNP (last 3 results) No results for input(s): PROBNP in the last 8760 hours. HbA1C: Recent Labs    07/19/19 0015  HGBA1C 6.1*   CBG: Recent Labs  Lab 07/20/19 1934 07/21/19 0025 07/21/19 0455 07/21/19 0832 07/21/19 1200  GLUCAP 180* 221* 210* 130* 134*   Lipid Profile: No results for input(s): CHOL, HDL, LDLCALC, TRIG, CHOLHDL, LDLDIRECT in the last 72 hours. Thyroid Function Tests: No results for  input(s): TSH, T4TOTAL, FREET4, T3FREE, THYROIDAB in the last 72 hours. Anemia Panel: No results for input(s): VITAMINB12, FOLATE, FERRITIN, TIBC, IRON, RETICCTPCT in the last 72 hours. Urine analysis:    Component Value Date/Time   COLORURINE YELLOW 07/14/2012 0825   APPEARANCEUR CLEAR 07/14/2012 0825   LABSPEC 1.020 07/14/2012 0825   PHURINE 7.5 07/14/2012 0825   GLUCOSEU NEGATIVE 07/14/2012 0825   HGBUR NEGATIVE 07/14/2012 0825   BILIRUBINUR NEGATIVE 07/14/2012 0825   KETONESUR NEGATIVE 07/14/2012 0825   PROTEINUR NEGATIVE 07/14/2012 0825   UROBILINOGEN 0.2 07/14/2012 0825   NITRITE NEGATIVE 07/14/2012 0825   LEUKOCYTESUR NEGATIVE 07/14/2012 0825   Sepsis Labs: @LABRCNTIP (procalcitonin:4,lacticidven:4)  ) Recent Results (from the past 240 hour(s))  MRSA PCR Screening     Status: None   Collection Time: 07/17/19  5:41 PM   Specimen: Nasal Mucosa; Nasopharyngeal  Result Value Ref Range Status   MRSA by PCR NEGATIVE NEGATIVE Final    Comment:        The GeneXpert MRSA Assay (FDA approved for NASAL specimens only), is one component of a comprehensive MRSA colonization surveillance program. It is not intended to diagnose MRSA infection nor to guide or monitor treatment for MRSA infections. Performed at [facility], [place] [address], [place], [place] [address]          Radiology Studies: No results found.      Scheduled Meds: . amLODipine  10 mg Oral Daily  . ascorbic acid  500 mg Oral Daily  . aspirin  81 mg Oral Daily  . citalopram  10 mg Oral Daily  . cloNIDine  0.1 mg Oral TID  . clopidogrel  75 mg Oral Daily  . dexamethasone (DECADRON) injection  6 mg Intravenous Q24H  . enoxaparin (LOVENOX) injection  40 mg Subcutaneous Q24H  . feeding supplement (ENSURE ENLIVE)  237 mL Oral TID BM  . feeding supplement (PRO-STAT SUGAR FREE 64)  30 mL Per Tube BID  . insulin aspart  0-9 Units Subcutaneous Q4H  . OLANZapine  2.5 mg Oral Daily  .  prednisoLONE acetate  1 drop Both Eyes QID  . zinc sulfate  220 mg Oral Daily   Continuous Infusions: . ceFEPime (MAXIPIME) IV 2 g (07/21/19 0952)  . feeding supplement (OSMOLITE 1.5 CAL) 1,000 mL (07/21/19 0947)     LOS: 9 days  The patient is critically ill with multiple organ systems failure and requires high complexity decision making for assessment and support, frequent evaluation and titration of therapies, application of advanced monitoring technologies and extensive interpretation of multiple databases. Critical Care Time devoted to patient care services described in this note  Time spent: 40 minutes     Marx Doig, Geraldo Docker, MD [facility] Pager [phone]  If 7PM-7AM, please contact night-coverage [email or web address] Password [facility] 07/21/2019, 3:24 PM

## 2019-07-22 LAB — CBC WITH DIFFERENTIAL/PLATELET
Abs Immature Granulocytes: 0.45 10*3/uL — ABNORMAL HIGH (ref 0.00–0.07)
Basophils Absolute: 0 10*3/uL (ref 0.0–0.1)
Basophils Relative: 0 %
Eosinophils Absolute: 0 10*3/uL (ref 0.0–0.5)
Eosinophils Relative: 0 %
HCT: 29.4 % — ABNORMAL LOW (ref 39.0–52.0)
Hemoglobin: 9.7 g/dL — ABNORMAL LOW (ref 13.0–17.0)
Immature Granulocytes: 4 %
Lymphocytes Relative: 12 %
Lymphs Abs: 1.3 10*3/uL (ref 0.7–4.0)
MCH: 30.9 pg (ref 26.0–34.0)
MCHC: 33 g/dL (ref 30.0–36.0)
MCV: 93.6 fL (ref 80.0–100.0)
Monocytes Absolute: 0.3 10*3/uL (ref 0.1–1.0)
Monocytes Relative: 3 %
Neutro Abs: 8.4 10*3/uL — ABNORMAL HIGH (ref 1.7–7.7)
Neutrophils Relative %: 81 %
Platelets: 269 10*3/uL (ref 150–400)
RBC: 3.14 MIL/uL — ABNORMAL LOW (ref 4.22–5.81)
RDW: 13 % (ref 11.5–15.5)
WBC: 10.5 10*3/uL (ref 4.0–10.5)
nRBC: 0 % (ref 0.0–0.2)

## 2019-07-22 LAB — GLUCOSE, CAPILLARY
Glucose-Capillary: 144 mg/dL — ABNORMAL HIGH (ref 70–99)
Glucose-Capillary: 147 mg/dL — ABNORMAL HIGH (ref 70–99)
Glucose-Capillary: 126 mg/dL — ABNORMAL HIGH (ref 70–99)
Glucose-Capillary: 120 mg/dL — ABNORMAL HIGH (ref 70–99)
Glucose-Capillary: 144 mg/dL — ABNORMAL HIGH (ref 70–99)
Glucose-Capillary: 135 mg/dL — ABNORMAL HIGH (ref 70–99)

## 2019-07-22 LAB — IRON AND TIBC
Iron: 62 ug/dL (ref 45–182)
Saturation Ratios: 25 % (ref 17.9–39.5)
TIBC: 246 ug/dL — ABNORMAL LOW (ref 250–450)
UIBC: 184 ug/dL

## 2019-07-22 LAB — COMPREHENSIVE METABOLIC PANEL
ALT: 162 U/L — ABNORMAL HIGH (ref 0–44)
AST: 52 U/L — ABNORMAL HIGH (ref 15–41)
Albumin: 2.4 g/dL — ABNORMAL LOW (ref 3.5–5.0)
Alkaline Phosphatase: 44 U/L (ref 38–126)
Anion gap: 8 (ref 5–15)
BUN: 69 mg/dL — ABNORMAL HIGH (ref 8–23)
CO2: 26 mmol/L (ref 22–32)
Calcium: 8.9 mg/dL (ref 8.9–10.3)
Chloride: 106 mmol/L (ref 98–111)
Creatinine, Ser: 1.26 mg/dL — ABNORMAL HIGH (ref 0.61–1.24)
GFR calc Af Amer: >60 mL/min (ref >60)
GFR calc non Af Amer: 54 mL/min — ABNORMAL LOW (ref >60)
Glucose, Bld: 148 mg/dL — ABNORMAL HIGH (ref 70–99)
Potassium: 4.6 mmol/L (ref 3.5–5.1)
Sodium: 140 mmol/L (ref 135–145)
Total Bilirubin: 0.8 mg/dL (ref 0.3–1.2)
Total Protein: 5.4 g/dL — ABNORMAL LOW (ref 6.5–8.1)

## 2019-07-22 LAB — D-DIMER, QUANTITATIVE: D-Dimer, Quant: 0.66 ug/mL-FEU — ABNORMAL HIGH (ref 0.00–0.50)

## 2019-07-22 LAB — RETICULOCYTES
Immature Retic Fract: 10.1 % (ref 2.3–15.9)
RBC.: 3.06 MIL/uL — ABNORMAL LOW (ref 4.22–5.81)
Retic Count, Absolute: 48 10*3/uL (ref 19.0–186.0)
Retic Ct Pct: 1.6 % (ref 0.4–3.1)

## 2019-07-22 LAB — FERRITIN: Ferritin: 993 ng/mL — ABNORMAL HIGH (ref 24–336)

## 2019-07-22 LAB — C-REACTIVE PROTEIN: CRP: 0.6 mg/dL (ref <1.0)

## 2019-07-22 LAB — TSH: TSH: 1.108 u[IU]/mL (ref 0.350–4.500)

## 2019-07-22 LAB — FOLATE: Folate: 13.6 ng/mL (ref >5.9)

## 2019-07-22 LAB — VITAMIN B12: Vitamin B-12: 531 pg/mL (ref 180–914)

## 2019-07-22 NOTE — Plan of Care (Signed)
Plan of Care reviewed. 

## 2019-07-22 NOTE — Progress Notes (Signed)
PROGRESS NOTE    Craig Avery  [id number] DOB: 1941/05/13 DOA: 07/12/2019 PCP: [name], MD   Brief Narrative:  Craig Avery a 79 y.o.WM PMHx CVA, HTN, HLD, carotid stenosis s/p L CEA    Presented to [facility] on 12/25 with fever and weakness. He initially had nasal congestion and scratchy throat, then developed nausea and decreased appetite. He presented to the ED after developing fevers, chills and generalized weakness. He's been treated at [facility] with steroids and remdesivir, but due to increasing O2 requirement, he was transferred to [facility]. Hospitalization complicated by visual hallucinations and the patient was seen by psychiatry.    Subjective: 1/10 afebrile last 24 hours, A/O x4.  Patient in a very good mood as his daughter brought him [place] fried chicken meal.     Assessment & Plan:   Active Problems:   COVID-19 virus infection   Pneumonia due to COVID-19 virus   Acute respiratory failure with hypoxia (HCC)   HCAP (healthcare-associated pneumonia)   Acute renal failure superimposed on stage 3b chronic kidney disease (HCC)   Essential hypertension   Visual hallucination   Anxiety   Panic attack   Elevated LFTs   Depression  Covid pneumonia/acute respiratory failure with hypoxia COVID-19 Labs  Recent Labs    07/20/19 0020 07/21/19 0449 07/22/19 0501  DDIMER 0.67* 0.75* 0.66*  FERRITIN  --   --  993*  CRP 1.1* 0.6 0.6    No results found for: SARSCOV2NAA -Decadron 6 mg daily -S/p Remdesivir;  -Procalcitonin elevated therefore not eligible for Actemra  HCAP -Cefepime x7 days -Results for KYRE, OLSHANSKY (MRN [id number]) as of 07/22/2019 15:00  Ref. Range 07/17/2019 02:30 07/18/2019 01:25 07/19/2019 00:15  Procalcitonin Latest Units: ng/mL 0.50 0.27 0.18    Acute on CKD stage III (baseline Cr 2 05/2019) Recent Labs  Lab 07/18/19 0125 07/19/19 0015 07/20/19 0020 07/21/19 0449 07/22/19 0501  CREATININE 1.30* 1.30* 1.18  1.21 1.26*  -Better than baseline, continue to monitor  Essential HTN -Amlodipine 10 mg daily  -Hydralazine IV PRN -1/9 increase Clonidine 0.1 mg  TID -Hold HCTZ and lisinopril given recent renal function  PVD/Hx CVA - continue aspirin, plavix, statin  Visual hallucinations/anxiety/panic attack -Seen by psychiatry at OSH who recommended low-dose Zyprexa PRN hallucinations -Currently negative hallucinations  Elevated LFTs -RUQ [place] negative -1/7 bumped up today, will monitor closely -1/7 discontinue Lipitor 20 mg until liver enzymes stabilize  Poor p.o. intake -1/6 core track placed.  Feeding started -1/7 sensitive SSI -1/8 patient refusing to eat becoming extremely agitated when I explained he needed the caloric intake. -1/9 patient continues to refuse to eat p.o. food will contact family to see if they would be willing to bring patient food from home. -1/10 patient happily eating his [place] fried chicken meal brought in by his family.  Depressed Mood:  -2/2 prolonged [facility] stay, continue to monitor, chaplain  -1/9 change Zyprexa 2.5 mg daily -1/9 Celexa 10 mg daily -1/10 TSH= pending   Continue home prednisolone acetate eye drops   Anemia of chronic disease -1/9 anemia panel; consistent with anemia of chronic disease -1/9 occult blood pending  Goals of care -PT; recommends SNF.  OT recommendation pending -1/9 consult LCSW; evaluate for SNF   DVT prophylaxis: Lovenox Code Status: Full Family Communication: 07/21/2019 Spoke with Collie Siad (Daughter) counseled her on plan of care answered all questions Disposition Plan:    Consultants:    Procedures/Significant Events:     I have personally reviewed  and interpreted all radiology studies and my findings are as above.  VENTILATOR SETTINGS: Room air 1/10 SPO2 91%   Cultures   Antimicrobials: Anti-infectives (From admission, onward)   Start     Dose/Rate Stop   07/17/19 1300  ceFEPIme (MAXIPIME) 2 g in  sodium chloride 0.9 % 100 mL IVPB     2 g 200 mL/hr over 30 Minutes 07/23/19 2359       Devices    LINES / TUBES:      Continuous Infusions: . ceFEPime (MAXIPIME) IV 2 g (07/22/19 1042)  . feeding supplement (OSMOLITE 1.5 CAL) 1,000 mL (07/22/19 0741)     Objective: Vitals:   07/22/19 0400 07/22/19 0743 07/22/19 1148 07/22/19 1159  BP:  (!) 148/62 (!) 159/61 (!) 144/63  Pulse:  78 84 80  Resp:  19 18 19   Temp:  97.7 F (36.5 C) 98.1 F (36.7 C)   TempSrc:  Oral Oral   SpO2:  91% 90% 90%  Weight: 91.6 kg     Height:        Intake/Output Summary (Last 24 hours) at 07/22/2019 1314 Last data filed at 07/22/2019 0300 Gross per 24 hour  Intake 1100 ml  Output 300 ml  Net 800 ml   Filed Weights   07/20/19 0500 07/21/19 0501 07/22/19 0400  Weight: 92 kg 91.4 kg 91.6 kg   Physical Exam:  General: A/O x4, no acute respiratory distress Eyes: negative scleral hemorrhage, negative anisocoria, negative icterus ENT: Negative Runny nose, negative gingival bleeding, Neck:  Negative scars, masses, torticollis, lymphadenopathy, JVD Lungs: Clear to auscultation bilaterally without wheezes or crackles Cardiovascular: Regular rate and rhythm without murmur gallop or rub normal S1 and S2 Abdomen: negative abdominal pain, nondistended, positive soft, bowel sounds, no rebound, no ascites, no appreciable mass Extremities: No significant cyanosis, clubbing, or edema bilateral lower extremities Skin: Negative rashes, lesions, ulcers Psychiatric:  Negative depression, negative anxiety, negative fatigue, negative mania  Central nervous system:  Cranial nerves II through XII intact, tongue/uvula midline, all extremities muscle strength 5/5, sensation intact throughout,  negative dysarthria, negative expressive aphasia, negative receptive aphasia.        Data Reviewed: Care during the described time interval was provided by me .  I have reviewed this patient's available data,  including medical history, events of note, physical examination, and all test results as part of my evaluation.   CBC: Recent Labs  Lab 07/18/19 0125 07/19/19 0015 07/20/19 0020 07/21/19 0449 07/22/19 0501  WBC 8.8 9.0 9.7 11.2* 10.5  NEUTROABS 7.4 7.8* 8.3* 9.1* 8.4*  HGB 11.7* 10.4* 9.9* 9.8* 9.7*  HCT 34.9* 31.9* 30.0* 29.2* 29.4*  MCV 92.3 91.4 93.2 93.3 93.6  PLT 326 318 298 280 [id number]   Basic Metabolic Panel: Recent Labs  Lab 07/18/19 0125 07/18/19 1717 07/19/19 0015 07/19/19 1637 07/20/19 0020 07/21/19 0449 07/22/19 0501  NA 138  --  138  --  138 139 140  K 4.5  --  4.7  --  4.8 4.7 4.6  CL 104  --  105  --  104 105 106  CO2 22  --  23  --  22 24 26   GLUCOSE 158*  --  173*  --  188* 169* 148*  BUN 60*  --  59*  --  63* 64* 69*  CREATININE 1.30*  --  1.30*  --  1.18 1.21 1.26*  CALCIUM 8.6*  --  8.8*  --  9.0 8.9 8.9  MG 2.4 2.2  2.1 1.9 1.8  --   --   PHOS 2.8 2.8 3.2 2.8 3.1  --   --    GFR: Estimated Creatinine Clearance: 47.8 mL/min (A) (by C-G formula based on SCr of 1.26 mg/dL (H)). Liver Function Tests: Recent Labs  Lab 07/18/19 0125 07/19/19 0015 07/20/19 0020 07/21/19 0449 07/22/19 0501  AST 41 61* 49* 40 52*  ALT 144* 175* 169* 148* 162*  ALKPHOS 55 50 48 47 44  BILITOT 0.6 0.5 0.9 0.4 0.8  PROT 6.3* 5.9* 5.8* 5.5* 5.4*  ALBUMIN 2.6* 2.3* 2.5* 2.4* 2.4*   No results for input(s): LIPASE, AMYLASE in the last 168 hours. No results for input(s): AMMONIA in the last 168 hours. Coagulation Profile: No results for input(s): INR, PROTIME in the last 168 hours. Cardiac Enzymes: No results for input(s): CKTOTAL, CKMB, CKMBINDEX, TROPONINI in the last 168 hours. BNP (last 3 results) No results for input(s): PROBNP in the last 8760 hours. HbA1C: No results for input(s): HGBA1C in the last 72 hours. CBG: Recent Labs  Lab 07/21/19 1953 07/22/19 0057 07/22/19 0357 07/22/19 0740 07/22/19 1147  GLUCAP 156* 144* 147* 126* 120*   Lipid Profile: No  results for input(s): CHOL, HDL, LDLCALC, TRIG, CHOLHDL, LDLDIRECT in the last 72 hours. Thyroid Function Tests: Recent Labs    07/22/19 0501  TSH 1.108   Anemia Panel: Recent Labs    07/22/19 0501  VITAMINB12 531  FOLATE 13.6  FERRITIN 993*  TIBC 246*  IRON 62  RETICCTPCT 1.6   Urine analysis:    Component Value Date/Time   COLORURINE YELLOW 07/14/2012 0825   APPEARANCEUR CLEAR 07/14/2012 0825   LABSPEC 1.020 07/14/2012 0825   PHURINE 7.5 07/14/2012 0825   GLUCOSEU NEGATIVE 07/14/2012 0825   HGBUR NEGATIVE 07/14/2012 0825   BILIRUBINUR NEGATIVE 07/14/2012 0825   KETONESUR NEGATIVE 07/14/2012 0825   PROTEINUR NEGATIVE 07/14/2012 0825   UROBILINOGEN 0.2 07/14/2012 0825   NITRITE NEGATIVE 07/14/2012 0825   LEUKOCYTESUR NEGATIVE 07/14/2012 0825   Sepsis Labs: @LABRCNTIP (procalcitonin:4,lacticidven:4)  ) Recent Results (from the past 240 hour(s))  MRSA PCR Screening     Status: None   Collection Time: 07/17/19  5:41 PM   Specimen: Nasal Mucosa; Nasopharyngeal  Result Value Ref Range Status   MRSA by PCR NEGATIVE NEGATIVE Final    Comment:        The GeneXpert MRSA Assay (FDA approved for NASAL specimens only), is one component of a comprehensive MRSA colonization surveillance program. It is not intended to diagnose MRSA infection nor to guide or monitor treatment for MRSA infections. Performed at [facility], [place] [address] [place], [place] [address]          Radiology Studies: No results found.      Scheduled Meds: . amLODipine  10 mg Oral Daily  . ascorbic acid  500 mg Oral Daily  . aspirin  81 mg Oral Daily  . citalopram  10 mg Oral Daily  . cloNIDine  0.1 mg Oral TID  . clopidogrel  75 mg Oral Daily  . dexamethasone (DECADRON) injection  6 mg Intravenous Q24H  . enoxaparin (LOVENOX) injection  40 mg Subcutaneous Q24H  . feeding supplement (ENSURE ENLIVE)  237 mL Oral TID BM  . feeding supplement (PRO-STAT SUGAR FREE  64)  30 mL Per Tube BID  . insulin aspart  0-9 Units Subcutaneous Q4H  . OLANZapine  2.5 mg Oral Daily  . prednisoLONE acetate  1 drop Both Eyes QID  .  zinc sulfate  220 mg Oral Daily   Continuous Infusions: . ceFEPime (MAXIPIME) IV 2 g (07/22/19 1042)  . feeding supplement (OSMOLITE 1.5 CAL) 1,000 mL (07/22/19 0741)     LOS: 10 days   The patient is critically ill with multiple organ systems failure and requires high complexity decision making for assessment and support, frequent evaluation and titration of therapies, application of advanced monitoring technologies and extensive interpretation of multiple databases. Critical Care Time devoted to patient care services described in this note  Time spent: 40 minutes     Craig Avery, Geraldo Docker, MD [facility] Pager [phone]  If 7PM-7AM, please contact night-coverage [email or web address] Password [email or web address] 07/22/2019, 1:14 PM

## 2019-07-23 LAB — C-REACTIVE PROTEIN: CRP: 0.7 mg/dL (ref <1.0)

## 2019-07-23 LAB — GLUCOSE, CAPILLARY
Glucose-Capillary: 114 mg/dL — ABNORMAL HIGH (ref 70–99)
Glucose-Capillary: 116 mg/dL — ABNORMAL HIGH (ref 70–99)
Glucose-Capillary: 123 mg/dL — ABNORMAL HIGH (ref 70–99)
Glucose-Capillary: 124 mg/dL — ABNORMAL HIGH (ref 70–99)
Glucose-Capillary: 134 mg/dL — ABNORMAL HIGH (ref 70–99)
Glucose-Capillary: 141 mg/dL — ABNORMAL HIGH (ref 70–99)
Glucose-Capillary: 146 mg/dL — ABNORMAL HIGH (ref 70–99)

## 2019-07-23 LAB — COMPREHENSIVE METABOLIC PANEL
ALT: 174 U/L — ABNORMAL HIGH (ref 0–44)
AST: 57 U/L — ABNORMAL HIGH (ref 15–41)
Albumin: 2.4 g/dL — ABNORMAL LOW (ref 3.5–5.0)
Alkaline Phosphatase: 48 U/L (ref 38–126)
Anion gap: 14 (ref 5–15)
BUN: 62 mg/dL — ABNORMAL HIGH (ref 8–23)
CO2: 23 mmol/L (ref 22–32)
Calcium: 8.5 mg/dL — ABNORMAL LOW (ref 8.9–10.3)
Chloride: 103 mmol/L (ref 98–111)
Creatinine, Ser: 1.17 mg/dL (ref 0.61–1.24)
GFR calc Af Amer: >60 mL/min (ref >60)
GFR calc non Af Amer: 59 mL/min — ABNORMAL LOW (ref >60)
Glucose, Bld: 142 mg/dL — ABNORMAL HIGH (ref 70–99)
Potassium: 4.4 mmol/L (ref 3.5–5.1)
Sodium: 140 mmol/L (ref 135–145)
Total Bilirubin: 0.9 mg/dL (ref 0.3–1.2)
Total Protein: 5.5 g/dL — ABNORMAL LOW (ref 6.5–8.1)

## 2019-07-23 LAB — CBC WITH DIFFERENTIAL/PLATELET
Abs Immature Granulocytes: 0.51 10*3/uL — ABNORMAL HIGH (ref 0.00–0.07)
Basophils Absolute: 0 10*3/uL (ref 0.0–0.1)
Basophils Relative: 0 %
Eosinophils Absolute: 0 10*3/uL (ref 0.0–0.5)
Eosinophils Relative: 0 %
HCT: 30.2 % — ABNORMAL LOW (ref 39.0–52.0)
Hemoglobin: 10 g/dL — ABNORMAL LOW (ref 13.0–17.0)
Immature Granulocytes: 4 %
Lymphocytes Relative: 13 %
Lymphs Abs: 1.5 10*3/uL (ref 0.7–4.0)
MCH: 31 pg (ref 26.0–34.0)
MCHC: 33.1 g/dL (ref 30.0–36.0)
MCV: 93.5 fL (ref 80.0–100.0)
Monocytes Absolute: 0.4 10*3/uL (ref 0.1–1.0)
Monocytes Relative: 3 %
Neutro Abs: 9.2 10*3/uL — ABNORMAL HIGH (ref 1.7–7.7)
Neutrophils Relative %: 80 %
Platelets: 253 10*3/uL (ref 150–400)
RBC: 3.23 MIL/uL — ABNORMAL LOW (ref 4.22–5.81)
RDW: 13.2 % (ref 11.5–15.5)
WBC: 11.5 10*3/uL — ABNORMAL HIGH (ref 4.0–10.5)
nRBC: 0 % (ref 0.0–0.2)

## 2019-07-23 LAB — D-DIMER, QUANTITATIVE: D-Dimer, Quant: 0.64 ug/mL-FEU — ABNORMAL HIGH (ref 0.00–0.50)

## 2019-07-23 MED ORDER — LISINOPRIL 2.5 MG PO TABS
2.5000 mg | ORAL_TABLET | Freq: Every day | ORAL | Status: DC
  Administered 2019-07-23 – 2019-07-24 (×2): 2.5 mg via ORAL
  Filled 2019-07-23 (×2): qty 1

## 2019-07-23 NOTE — TOC Progression Note (Signed)
Transition of Care [facility]) - Progression Note    Patient Details  Name: XYION VIRDEN MRN: [id number] Date of Birth: [birth year]  [facility] CM/SW Contact  Shade Flood, LCSW Phone Number: 07/23/2019, 3:48 PM  Clinical Narrative:     Spoke with pt's daughter to discuss dc planning. She spoke with pt and they have decided that pt will dc home with [facility] when stable. Anticipating dc will be tomorrow.   Updated Santiago Glad at [place]. Pt may need O2 for dc as sats dropped into 70's when working with OT today.  Updated MD. Will follow up in AM.  Expected Discharge Plan: [place] Barriers to Discharge: Continued Medical Work up  Expected Discharge Plan and Services Expected Discharge Plan: [place] In-house Referral: Clinical Social Work   Post Acute Care Choice: [place] arrangements for the past 2 months: [place]: PT, OT [place] Agency: [place] ([place]) Date HH Agency Contacted: 07/16/19 Time [place]: [id number] Representative spoke with at [place]: [place] ([place]) Interventions    Readmission Risk Interventions Readmission Risk Prevention Plan 07/16/2019 07/11/2019  Transportation Screening Complete -  Home Care Screening Complete -  Medication Review (RN CM) Complete -  Medication Review Press photographer) - Complete  Some recent data might be hidden

## 2019-07-23 NOTE — Progress Notes (Addendum)
Occupational Therapy Evaluation Patient Details Name: Craig [facility] MRN: [id number] DOB: 26-Jun-1941 Today's Date: 07/23/2019    History of Present Illness 79 y.o. male with medical history significant of CVA, HTN, HLD, carotid stenosis s/p L CEA who presented to [facility] on 12/25 with fever and weakness.  He initially had nasal congestion and scratchy throat, then developed nausea and decreased appetite.  He presented to the ED after developing fevers, chills and generalized weakness.  He's been treated at [facility] with steroids and remdesivir, but due to increasing O2 requirement, he was transferred to [facility].  Hospitalization complicated by visual hallucinations and the patient was seen by psychiatry.       Clinical Impression   PTA pt lived alone, independent in all ADL, IADL, and mobility tasks. Pt does not ambulate with an assistive device and reports 0 falls in the last 6 months. Pt does not use oxygen at home and is currently on room air. Pt currently independent to min assist for self-care and mobility tasks. Pt able to ambulate to/from bathroom, complete toileting task, and hygiene at the sink. SpO2 decreased to 79% on room air. Pt unable to increase oxygen with pursed lip breathing with 1L oxygen donned. Pt at 92% at rest on 1L at end of session. 2/4 DOE. RN updated. Educated pt on safety strategies and energy conservation techniques with fair understanding. Pt demonstrates decreased strength, endurance, balance, standing tolerance, and activity tolerance impacting ability to complete self-care and functional transfer tasks. Recommend skilled OT services to address above deficits in order to promote function and prevent further decline. Recommend CIR for continued rehab following hospital discharge as pt lives alone and is unable to care for himself at this time.      Follow Up Recommendations   CIR    Equipment Recommendations  None recommended by OT    Recommendations for Other  Services       Precautions / Restrictions Precautions Precautions: Fall;Other (comment) Precaution Comments: Monitor oxygen, desats with activity Restrictions Weight Bearing Restrictions: No      Mobility Bed Mobility Overal bed mobility: Needs Assistance Bed Mobility: Supine to Sit     Supine to sit: Supervision     General bed mobility comments: Use of bedrail  Transfers Overall transfer level: Needs assistance Equipment used: None Transfers: Sit to/from [employer] Transfers Sit to Stand: Min guard Stand pivot transfers: Min guard       General transfer comment: Pt able to transfer to bedside chair with min guard.     Balance Overall balance assessment: Needs assistance Sitting-balance support: Feet supported Sitting balance-Leahy Scale: Good       Standing balance-Leahy Scale: Fair                             ADL either performed or assessed with clinical judgement   ADL Overall ADL's : Needs assistance/impaired Eating/Feeding: Independent;Sitting   Grooming: Min guard;Standing   Upper Body Bathing: Set up;Supervision/ safety;Sitting   Lower Body Bathing: Min guard;Sit to/from stand;Sitting/lateral leans;Minimal assistance   Upper Body Dressing : Supervision/safety;Set up;Sitting   Lower Body Dressing: Min guard;Minimal assistance;Sit to/from stand;Sitting/lateral leans   Toilet Transfer: Set up;Supervision/safety;Regular Toilet;Ambulation;Grab bars   Toileting- Clothing Manipulation and Hygiene: Min guard;Sit to/from stand       Functional mobility during ADLs: Min guard General ADL Comments: Pt able to ambulate to/from bathroom without an assistive device. Noted 0 instances of LOB, however pt unsteady  on feet.     Vision Baseline Vision/History: Wears glasses Wears Glasses: At all times       Perception     Praxis      Pertinent Vitals/Pain Pain Assessment: No/denies pain     Hand Dominance Right    Extremity/Trunk Assessment Upper Extremity Assessment Upper Extremity Assessment: Generalized weakness   Lower Extremity Assessment Lower Extremity Assessment: Defer to PT evaluation       Communication Communication Communication: No difficulties   Cognition Arousal/Alertness: Awake/alert Behavior During Therapy: WFL for tasks assessed/performed Overall Cognitive Status: Within Functional Limits for tasks assessed                                     General Comments  Pt on room air at rest with SpO2 91%. SpO2 dropped to 79% on room air after ambulating to/from bathroom. Pt unable to increase oxygen with pursed lip breathing with 1L O2 donned and oxygen increasing back to 92%.     Exercises     Shoulder Instructions      Home Living Family/patient expects to be discharged to:: Private residence Living Arrangements: Alone Available Help at Discharge: Family Type of Home: House Home Access: Ramped entrance     [place]: One level     Bathroom Shower/Tub: Teacher, early years/pre: Standard Bathroom Accessibility: Yes   Home Equipment: Wheelchair - manual;Bedside commode;Walker - 4 wheels;Grab bars - toilet;Grab bars - tub/shower          Prior Functioning/Environment Level of Independence: Independent        Comments: Pt independent in ADLs, IADLs, and mobility. Pt does not ambulate with an assistive device and reports 0 falls in the last 6 months. Pt still drives. Pt does not use oxygen at home.        OT Problem List: Decreased strength;Decreased activity tolerance;Impaired balance (sitting and/or standing);Cardiopulmonary status limiting activity      OT Treatment/Interventions: Self-care/ADL training;Therapeutic exercise;Neuromuscular education;Energy conservation;DME and/or AE instruction;Therapeutic activities;Patient/family education;Balance training    OT Goals(Current goals can be found in the care plan section) Acute  Rehab OT Goals Patient Stated Goal: To go home Time For Goal Achievement: 08/06/19 Potential to Achieve Goals: Good ADL Goals Pt Will Perform Grooming: Independently;standing Pt Will Perform Lower Body Bathing: Independently;sit to/from stand Pt Will Perform Lower Body Dressing: Independently;sit to/from stand Pt Will Transfer to Toilet: Independently;ambulating;regular height toilet Pt Will Perform Toileting - Clothing Manipulation and hygiene: Independently;sit to/from stand Additional ADL Goal #1: Pt to recall and verbalize 3 energy conservation strategies with 0 verbal cues. Additional ADL Goal #2: Pt to recall and verbalize 3 fall prevention strategies with 0 verbal cues. Additional ADL Goal #3: Pt to tolerate standing up to 5 min independently with SpO2 maintaining above 90%, in preparation for ADLs.  OT Frequency: Min 3X/week   Barriers to D/C:            Co-evaluation              AM-PAC OT "6 Clicks" Daily Activity     Outcome Measure Help from another person eating meals?: None Help from another person taking care of personal grooming?: A Little Help from another person toileting, which includes using toliet, bedpan, or urinal?: A Little Help from another person bathing (including washing, rinsing, drying)?: A Little Help from another person to put on and taking off regular upper body clothing?:  A Little Help from another person to put on and taking off regular lower body clothing?: A Little 6 Click Score: 19   End of Session Equipment Utilized During Treatment: Oxygen Nurse Communication: Mobility status  Activity Tolerance: Patient limited by fatigue(Limited by SOB) Patient left: in chair;with call bell/phone within reach;with chair alarm set  OT Visit Diagnosis: Unsteadiness on feet (R26.81);Muscle weakness (generalized) (M62.81)                Time: 1000-1045 OT Time Calculation (min): 45 min Charges:  OT General Charges $OT Visit: 1 Visit OT  Evaluation $OT Eval Moderate Complexity: 1 Mod OT Treatments $Self Care/Home Management : 8-22 mins $Therapeutic Activity: 8-22 mins  Mauri Brooklyn OTR/L [phone]   Mauri Brooklyn 07/23/2019, 10:55 AM

## 2019-07-23 NOTE — Progress Notes (Signed)
Chaplain providing spiritual support with Craig Avery at bedside.  Provided pastoral presence and space for Craig Avery to engage in life review, honoring values he and his spouse have passed to their daughters, grand children, and great grand children.  Craig Avery spoke with chaplain about the death of his spouse and engaged in grief work.    Craig Avery, MDiv, [facility]  Lead Clinical Chaplain [facility]

## 2019-07-23 NOTE — Progress Notes (Signed)
Ambulated pt in room on RA. Pre ambulation pt was on RA with O2sats 91% at rest. Upon standing pt O2sats 89%, upon ambulation to doorway and back pt O2sat dropped to 78% and pt visibly SOB. Pt placed in bed and 4lnc placed until pt recovered. Pt placed back on RA after recovery and sats read 90-92%.

## 2019-07-23 NOTE — Progress Notes (Signed)
Rehab Admissions Coordinator Note:  Patient was screened by Cleatrice Burke for appropriateness for an Inpatient Acute Rehab Consult per OT recommendations.  At this time, we are recommending [place] with family support 24/7. Pt not in need of an intense inpt rehab admit at min guard assist level.  Cleatrice Burke RN MSN 07/23/2019, 4:03 PM  I can be reached at [phone].

## 2019-07-23 NOTE — Progress Notes (Addendum)
PROGRESS NOTE    Leelen Vizzini [facility]  [id number] DOB: February 06, 1941 DOA: 07/12/2019 PCP: [name], MD   Brief Narrative:  [facility] a 79 y.o.WM PMHx CVA, HTN, HLD, carotid stenosis s/p L CEA    Presented to [facility] on 12/25 with fever and weakness. He initially had nasal congestion and scratchy throat, then developed nausea and decreased appetite. He presented to the ED after developing fevers, chills and generalized weakness. He's been treated at [facility] with steroids and remdesivir, but due to increasing O2 requirement, he was transferred to [facility]. Hospitalization complicated by visual hallucinations and the patient was seen by psychiatry.    Subjective: 1/11 afebrile overnight A/O x4, negative S OB, positive DOE     Assessment & Plan:   Active Problems:   COVID-19 virus infection   Pneumonia due to COVID-19 virus   Acute respiratory failure with hypoxia (HCC)   HCAP (healthcare-associated pneumonia)   Acute renal failure superimposed on stage 3b chronic kidney disease (HCC)   Essential hypertension   Visual hallucination   Anxiety   Panic attack   Elevated LFTs   Depression  Covid pneumonia/acute respiratory failure with hypoxia COVID-19 Labs  Recent Labs    07/21/19 0449 07/22/19 0501 07/23/19 0443  DDIMER 0.75* 0.66* 0.64*  FERRITIN  --  993*  --   CRP 0.6 0.6 0.7   07/06/2019 POC SARS coronavirus positive  -Decadron 6 mg daily -S/p Remdesivir;  -Procalcitonin elevated therefore not eligible for Actemra -1/11 ambulatory SPO2 pending   HCAP -Cefepime x7 days -Results for LYDEN, DEASY (MRN [id number]) as of 07/22/2019 15:00  Ref. Range 07/17/2019 02:30 07/18/2019 01:25 07/19/2019 00:15  Procalcitonin Latest Units: ng/mL 0.50 0.27 0.18    Acute on CKD stage III (baseline Cr 2 05/2019) Recent Labs  Lab 07/19/19 0015 07/20/19 0020 07/21/19 0449 07/22/19 0501 07/23/19 0443  CREATININE 1.30* 1.18 1.21 1.26* 1.17  -Better than  baseline, continue to monitor  Essential HTN -Amlodipine 10 mg daily  -Hydralazine IV PRN -Clonidine 0.1 mg  TID -1/11 restart lisinopril 2.5 mg (much lower dose than previous home dose) -Hold HCTZ  given recent renal function  PVD/Hx CVA - continue aspirin, plavix, statin  Visual hallucinations/anxiety/panic attack -Seen by psychiatry at OSH who recommended low-dose Zyprexa PRN hallucinations -Currently negative hallucinations  Elevated LFTs -RUQ [place] negative -1/7 bumped up today, will monitor closely -1/7 discontinue Lipitor 20 mg until liver enzymes stabilize  Poor p.o. intake -1/6 core track placed.  Feeding started -1/7 sensitive SSI -1/8 patient refusing to eat becoming extremely agitated when I explained he needed the caloric intake. -1/9 patient continues to refuse to eat p.o. food will contact family to see if they would be willing to bring patient food from home. -1/10 patient happily eating his [place] fried chicken meal brought in by his family.  Depressed Mood:  -2/2 prolonged hospital stay, continue to monitor, chaplain  -1/9 change Zyprexa 2.5 mg daily -1/9 Celexa 10 mg daily -1/10 TSH= WNL   Continue home prednisolone acetate eye drops   Anemia of chronic disease -1/9 anemia panel; consistent with anemia of chronic disease -1/9 occult blood pending  Goals of care -PT; recommends SNF.  OT recommendation pending -1/9 consult LCSW; evaluate for SNF -1/11 face-to-face complete   DVT prophylaxis: Lovenox Code Status: Full Family Communication: 07/21/2019 Spoke with Collie Siad (Daughter) counseled her on plan of care answered all questions Disposition Plan:    Consultants:    Procedures/Significant Events:  I have personally reviewed and interpreted all radiology studies and my findings are as above.  VENTILATOR SETTINGS: Room air 1/11 SPO2 92%   Cultures 07/06/2019 POC SARS coronavirus positive 1/1 acute hepatitis panel negative 1/5 HIV  screen nonreactive    Antimicrobials: Anti-infectives (From admission, onward)   Start     Dose/Rate Stop   07/17/19 1300  ceFEPIme (MAXIPIME) 2 g in sodium chloride 0.9 % 100 mL IVPB     2 g 200 mL/hr over 30 Minutes 07/23/19 2359       Devices    LINES / TUBES:      Continuous Infusions: . ceFEPime (MAXIPIME) IV Stopped (07/22/19 2340)  . feeding supplement (OSMOLITE 1.5 CAL) 1,000 mL (07/22/19 2017)     Objective: Vitals:   07/23/19 0000 07/23/19 0400 07/23/19 0451 07/23/19 0848  BP: (!) 146/61 (!) 128/53  (!) 149/66  Pulse: (!) 51 (!) 49  80  Resp: (!) 21 18  16   Temp:  (!) 97.5 F (36.4 C)  (!) 97.5 F (36.4 C)  TempSrc:  Oral  Oral  SpO2: 90% 93%  90%  Weight:   82.6 kg   Height:        Intake/Output Summary (Last 24 hours) at 07/23/2019 1105 Last data filed at 07/23/2019 0400 Gross per 24 hour  Intake 1494.23 ml  Output --  Net 1494.23 ml   Filed Weights   07/21/19 0501 07/22/19 0400 07/23/19 0451  Weight: 91.4 kg 91.6 kg 82.6 kg   Physical Exam:  General: A/O x4, no acute respiratory distress Eyes: negative scleral hemorrhage, negative anisocoria, negative icterus ENT: Negative Runny nose, negative gingival bleeding, Neck:  Negative scars, masses, torticollis, lymphadenopathy, JVD Lungs: Clear to auscultation bilaterally without wheezes or crackles Cardiovascular: Regular rate and rhythm without murmur gallop or rub normal S1 and S2 Abdomen: negative abdominal pain, nondistended, positive soft, bowel sounds, no rebound, no ascites, no appreciable mass Extremities: No significant cyanosis, clubbing, or edema bilateral lower extremities Skin: Negative rashes, lesions, ulcers Psychiatric:  Negative depression, negative anxiety, negative fatigue, negative mania  Central nervous system:  Cranial nerves II through XII intact, tongue/uvula midline, all extremities muscle strength 5/5, sensation intact throughout, negative dysarthria, negative  expressive aphasia, negative receptive aphasia.      Data Reviewed: Care during the described time interval was provided by me .  I have reviewed this patient's available data, including medical history, events of note, physical examination, and all test results as part of my evaluation.   CBC: Recent Labs  Lab 07/19/19 0015 07/20/19 0020 07/21/19 0449 07/22/19 0501 07/23/19 0443  WBC 9.0 9.7 11.2* 10.5 11.5*  NEUTROABS 7.8* 8.3* 9.1* 8.4* 9.2*  HGB 10.4* 9.9* 9.8* 9.7* 10.0*  HCT 31.9* 30.0* 29.2* 29.4* 30.2*  MCV 91.4 93.2 93.3 93.6 93.5  PLT 318 298 280 269 [id number]   Basic Metabolic Panel: Recent Labs  Lab 07/18/19 0125 07/18/19 1717 07/19/19 0015 07/19/19 1637 07/20/19 0020 07/21/19 0449 07/22/19 0501 07/23/19 0443  NA 138  --  138  --  138 139 140 140  K 4.5  --  4.7  --  4.8 4.7 4.6 4.4  CL 104  --  105  --  104 105 106 103  CO2 22  --  23  --  22 24 26 23   GLUCOSE 158*  --  173*  --  188* 169* 148* 142*  BUN 60*  --  59*  --  63* 64* 69* 62*  CREATININE 1.30*  --  1.30*  --  1.18 1.21 1.26* 1.17  CALCIUM 8.6*  --  8.8*  --  9.0 8.9 8.9 8.5*  MG 2.4 2.2 2.1 1.9 1.8  --   --   --   PHOS 2.8 2.8 3.2 2.8 3.1  --   --   --    GFR: Estimated Creatinine Clearance: 48.9 mL/min (by C-G formula based on SCr of 1.17 mg/dL). Liver Function Tests: Recent Labs  Lab 07/19/19 0015 07/20/19 0020 07/21/19 0449 07/22/19 0501 07/23/19 0443  AST 61* 49* 40 52* 57*  ALT 175* 169* 148* 162* 174*  ALKPHOS 50 48 47 44 48  BILITOT 0.5 0.9 0.4 0.8 0.9  PROT 5.9* 5.8* 5.5* 5.4* 5.5*  ALBUMIN 2.3* 2.5* 2.4* 2.4* 2.4*   No results for input(s): LIPASE, AMYLASE in the last 168 hours. No results for input(s): AMMONIA in the last 168 hours. Coagulation Profile: No results for input(s): INR, PROTIME in the last 168 hours. Cardiac Enzymes: No results for input(s): CKTOTAL, CKMB, CKMBINDEX, TROPONINI in the last 168 hours. BNP (last 3 results) No results for input(s): PROBNP in  the last 8760 hours. HbA1C: No results for input(s): HGBA1C in the last 72 hours. CBG: Recent Labs  Lab 07/22/19 1550 07/22/19 1949 07/23/19 0046 07/23/19 0422 07/23/19 0843  GLUCAP 144* 135* 116* 146* 141*   Lipid Profile: No results for input(s): CHOL, HDL, LDLCALC, TRIG, CHOLHDL, LDLDIRECT in the last 72 hours. Thyroid Function Tests: Recent Labs    07/22/19 0501  TSH 1.108   Anemia Panel: Recent Labs    07/22/19 0501  VITAMINB12 531  FOLATE 13.6  FERRITIN 993*  TIBC 246*  IRON 62  RETICCTPCT 1.6   Urine analysis:    Component Value Date/Time   COLORURINE YELLOW 07/14/2012 0825   APPEARANCEUR CLEAR 07/14/2012 0825   LABSPEC 1.020 07/14/2012 0825   PHURINE 7.5 07/14/2012 0825   GLUCOSEU NEGATIVE 07/14/2012 0825   HGBUR NEGATIVE 07/14/2012 0825   BILIRUBINUR NEGATIVE 07/14/2012 0825   KETONESUR NEGATIVE 07/14/2012 0825   PROTEINUR NEGATIVE 07/14/2012 0825   UROBILINOGEN 0.2 07/14/2012 0825   NITRITE NEGATIVE 07/14/2012 0825   LEUKOCYTESUR NEGATIVE 07/14/2012 0825   Sepsis Labs: @LABRCNTIP (procalcitonin:4,lacticidven:4)  ) Recent Results (from the past 240 hour(s))  MRSA PCR Screening     Status: None   Collection Time: 07/17/19  5:41 PM   Specimen: Nasal Mucosa; Nasopharyngeal  Result Value Ref Range Status   MRSA by PCR NEGATIVE NEGATIVE Final    Comment:        The GeneXpert MRSA Assay (FDA approved for NASAL specimens only), is one component of a comprehensive MRSA colonization surveillance program. It is not intended to diagnose MRSA infection nor to guide or monitor treatment for MRSA infections. Performed at [facility], [place] [address] [place], [place] [address]          Radiology Studies: No results found.      Scheduled Meds: . amLODipine  10 mg Oral Daily  . ascorbic acid  500 mg Oral Daily  . aspirin  81 mg Oral Daily  . citalopram  10 mg Oral Daily  . cloNIDine  0.1 mg Oral TID  . clopidogrel  75  mg Oral Daily  . dexamethasone (DECADRON) injection  6 mg Intravenous Q24H  . enoxaparin (LOVENOX) injection  40 mg Subcutaneous Q24H  . feeding supplement (ENSURE ENLIVE)  237 mL Oral TID BM  . feeding supplement (PRO-STAT SUGAR FREE 64)  30 mL Per Tube  BID  . insulin aspart  0-9 Units Subcutaneous Q4H  . OLANZapine  2.5 mg Oral Daily  . prednisoLONE acetate  1 drop Both Eyes QID  . zinc sulfate  220 mg Oral Daily   Continuous Infusions: . ceFEPime (MAXIPIME) IV Stopped (07/22/19 2340)  . feeding supplement (OSMOLITE 1.5 CAL) 1,000 mL (07/22/19 2017)     LOS: 11 days   The patient is critically ill with multiple organ systems failure and requires high complexity decision making for assessment and support, frequent evaluation and titration of therapies, application of advanced monitoring technologies and extensive interpretation of multiple databases. Critical Care Time devoted to patient care services described in this note  Time spent: 40 minutes     Libero Puthoff, Geraldo Docker, MD [facility] Pager [phone]  If 7PM-7AM, please contact night-coverage [email or web address] Password [facility] 07/23/2019, 11:05 AM

## 2019-07-23 NOTE — TOC Progression Note (Signed)
Transition of Care [facility]) - Progression Note    Patient Details  Name: Craig Avery MRN: [id number] Date of Birth: 1941-03-18  Transition of Care [facility]) CM/SW Contact  Shade Flood, LCSW Phone Number: 07/23/2019, 12:17 PM  Clinical Narrative:     TOC following. PT/OT recommending Home with [facility] and supervision. MD indicating pt needs SNF due to weakness and no one to assist at home. Discussed with patient by phone and he indicated that he didn't want to go to SNF rehab. He states that he wants to go home with [facility]. Updated MD who stated that he strongly recommends rehab and asked LCSW to follow up with family. Message left for pt's daughter. Spoke again with pt by phone to encourage rehab referral. Pt states "I'm sorry. I do not want to go lay in a hospital bed for another week while they stare at me, poke pills, and walk me for two minutes and say have a nice day."  Offered encouragement and described rehab care pt would receive at SNF. He stated that he will work on finding someone to stay with him during "first shift" and then his daughter can help him after work. Encouraged pt to talk to his daughter as well.  TOC awaiting return call from daughter. Will follow and further assist with dc planning and SNF referrals if pt will agree.  Expected Discharge Plan: [place] Barriers to Discharge: Continued Medical Work up  Expected Discharge Plan and Services Expected Discharge Plan: [place] In-house Referral: Clinical Social Work   Post Acute Care Choice: [place] arrangements for the past 2 months: [place]: PT, OT [place] Agency: [place] ([facility]) Date [place]: 07/16/19 Time [place]: [id number] Representative spoke with at [place]: [place] ([place]) Interventions    Readmission Risk Interventions Readmission Risk  Prevention Plan 07/16/2019 07/11/2019  Transportation Screening Complete -  Home Care Screening Complete -  Medication Review (RN CM) Complete -  Medication Review Press photographer) - Complete  Some recent data might be hidden

## 2019-07-23 NOTE — Progress Notes (Signed)
Chaplain follow up with pt during rounding due to LOS.    Pt asleep in chair during chaplain rounding.  Will follow up for continued support with pt tomorrow.    Jerene Pitch, MDiv, [facility]  Lead Clinical Chaplain  [facility]

## 2019-07-24 ENCOUNTER — Telehealth: Payer: Self-pay | Admitting: Family Medicine

## 2019-07-24 ENCOUNTER — Encounter: Payer: Self-pay | Admitting: Family Medicine

## 2019-07-24 DIAGNOSIS — F410 Panic disorder [episodic paroxysmal anxiety] without agoraphobia: Secondary | ICD-10-CM

## 2019-07-24 DIAGNOSIS — R441 Visual hallucinations: Secondary | ICD-10-CM

## 2019-07-24 DIAGNOSIS — F419 Anxiety disorder, unspecified: Secondary | ICD-10-CM

## 2019-07-24 DIAGNOSIS — R7989 Other specified abnormal findings of blood chemistry: Secondary | ICD-10-CM

## 2019-07-24 DIAGNOSIS — I10 Essential (primary) hypertension: Secondary | ICD-10-CM

## 2019-07-24 DIAGNOSIS — F320 Major depressive disorder, single episode, mild: Secondary | ICD-10-CM

## 2019-07-24 DIAGNOSIS — N1832 Chronic kidney disease, stage 3b: Secondary | ICD-10-CM

## 2019-07-24 DIAGNOSIS — N179 Acute kidney failure, unspecified: Secondary | ICD-10-CM

## 2019-07-24 LAB — GLUCOSE, CAPILLARY
Glucose-Capillary: 113 mg/dL — ABNORMAL HIGH (ref 70–99)
Glucose-Capillary: 127 mg/dL — ABNORMAL HIGH (ref 70–99)
Glucose-Capillary: 134 mg/dL — ABNORMAL HIGH (ref 70–99)
Glucose-Capillary: 137 mg/dL — ABNORMAL HIGH (ref 70–99)

## 2019-07-24 LAB — C-REACTIVE PROTEIN: CRP: 0.6 mg/dL (ref <1.0)

## 2019-07-24 LAB — D-DIMER, QUANTITATIVE: D-Dimer, Quant: 0.48 ug/mL-FEU (ref 0.00–0.50)

## 2019-07-24 MED ORDER — OLANZAPINE 2.5 MG PO TABS: 2.5000 mg | ORAL_TABLET | Freq: Every day | ORAL | 0 refills | Status: DC

## 2019-07-24 MED ORDER — LISINOPRIL 2.5 MG PO TABS: 2.5000 mg | ORAL_TABLET | Freq: Every day | ORAL | 0 refills | Status: DC

## 2019-07-24 MED ORDER — CLONIDINE HCL 0.1 MG PO TABS: 0.1000 mg | ORAL_TABLET | Freq: Three times a day (TID) | ORAL | 0 refills | Status: DC

## 2019-07-24 MED ORDER — CITALOPRAM HYDROBROMIDE 10 MG PO TABS: 10.0000 mg | ORAL_TABLET | Freq: Every day | ORAL | 0 refills | Status: DC

## 2019-07-24 NOTE — Plan of Care (Signed)
Discharge

## 2019-07-24 NOTE — Consult Note (Signed)
   [facility] CM Inpatient Consult   07/24/2019  Delmar Both [facility] February 24, 1941 [id number]    Patientscreened for 7 day and 30 day readmissions and to check for potential [facility] Care Management services needed as benefit from his Medicare/ NextGen plan witha23% high risk score forunplanned readmission. . Patient's chartreviewedand MD brief narrative shows as:  Margaretha Sheffield a 79 y.o.WM PMHx CVA, HTN, HLD, carotid stenosis s/p L CEA.    Presented to [facility] on 12/25 with fever and weakness, was treated at [facility] with steroids and remdesivir, but due to increasing O2 requirement, he was transferred to [facility]. Hospitalization complicated by visual hallucinations and the patient was seen by psychiatry.  [Covid pneumonia/acute respiratory failure with hypoxia]  Transition of care SW note shows that patient had refused skilled nursing facility; with patient and daughter deciding that he will discharge home with home health ([facility]) in spite MD's strong recommendation for rehab. Also patient needing Home O2 (arranged w/ [employer]).  Called and spoke to patient over the hospital room phone. HIPAA verified. Patient reports that plan is for him to go home.  [facility]) care management services discussed with patient for possible discharge needs buthe repeatedly denies any needs and states having everything that he needs and not needing anybody looking after him. He reports having no issues with medication (managed by daughter); pharmacy (using [facility] with no problem at all); transportation (patient drives, but daughter transports when needed). He statesliving athome alone but daughter Collie Siad) provides assistance with care and family supports him.   He confirms that his primary care provider is Dr. [name] with [place] [facility], listed to provide transition of care follow-up.  Patient has only agreed forEMMIcalls to  follow-up with hisrecoverypost hospitalization and confirmed contact information listed in Epic.  Referral made for EMMI Pneumonia calls to follow-up after discharge.  Please place a [facility] Care Management consult for any changes with post hospital needs for follow-up as appropriate.    For questions and referrals, please contact:  Edwena Felty A. Scott Fix, BSN, RN-BC [facility] Liaison Cell: [phone]

## 2019-07-24 NOTE — Care Management Important Message (Signed)
Important Message  Patient Details  Name: Craig Avery MRN: [id number] Date of Birth: 1940-12-10   Medicare Important Message Given:  Yes - Important Message mailed due to current National Emergency  Verbal consent obtained due to current National Emergency  Relationship to patient: Child Contact Name: Gillian Shields Call Date: 07/24/19  Time: 1324 Phone: [phone] Outcome: No Answer/Busy Important Message mailed to: Patient address on file    Delorse Lek 07/24/2019, 1:24 PM

## 2019-07-24 NOTE — Progress Notes (Signed)
SATURATION QUALIFICATIONS: (This note is used to comply with regulatory documentation for home oxygen)  Patient Saturations on Room Air at Rest = 91%  Patient Saturations on Room Air while Ambulating = 78%  Patient Saturations on 4lnc Liters of oxygen while Ambulating = 89%  Please briefly explain why patient needs home oxygen: Pt O2sats <80% with ambulation on RA. Pt also desats to mid 80's with any exertion.

## 2019-07-24 NOTE — Discharge Summary (Signed)
Physician Discharge Summary  Craig Avery [facility] [id number] DOB: [birth year] DOA: 07/12/2019  PCP: Craig Bush, MD  Admit date: 07/12/2019 Discharge date: 07/24/2019  Time spent: 30 minutes  Recommendations for Outpatient Follow-up:  Covid pneumonia/acute respiratory failure with hypoxia COVID-19 Labs  Recent Labs    07/22/19 0501 07/23/19 0443 07/24/19 0427  DDIMER 0.66* 0.64* 0.48  FERRITIN 993*  --   --   CRP 0.6 0.7 0.6    07/06/2019 POC SARS coronavirus positive  -Decadron 6 mg daily -S/p Remdesivir;  -Procalcitonin elevated therefore not eligible for Actemra -1/11 ambulatory SPO2 pending SATURATION QUALIFICATIONS: (This note is used to comply with regulatory documentation for home oxygen) Patient Saturations on Room Air at Rest = 91% Patient Saturations on Room Air while Ambulating = 78% Patient Saturations on 4lnc Liters of oxygen while Ambulating = 89% Please briefly explain why patient needs home oxygen: Pt O2sats <80% with ambulation on RA. Pt also desats to mid 80's with any exertion.  -Patient meets requirements for home O2 -2 L O2 via [place] titrate to maintain SPO2> 88% -Provide Inogen home portable O2 concentrator   HCAP -Cefepime x7 days   Acute on CKD stage III (baseline Cr 2 05/2019) Recent Labs  Lab 07/19/19 0015 07/20/19 0020 07/21/19 0449 07/22/19 0501 07/23/19 0443  CREATININE 1.30* 1.18 1.21 1.26* 1.17  -Baseline  Essential HTN -Amlodipine 10 mg daily  -Hydralazine IV PRN -Clonidine 0.1 mg  TID -1/11 restart lisinopril 2.5 mg (much lower dose than previous home dose) -Hold HCTZ  given recent renal function  PVD/Hx CVA - continue aspirin, plavix, statin  Visual hallucinations/anxiety/panic attack -Seen by psychiatry at OSH who recommended low-dose Zyprexa PRN hallucinations -Currently negative hallucinations  Elevated LFTs -RUQ [place] negative -1/7 bumped up today, will monitor closely -1/7 discontinue Lipitor 20 mg until  liver enzymes stabilize  Poor p.o. intake -1/6 core track placed.  Feeding started -1/7 sensitive SSI -1/8 patient refusing to eat becoming extremely agitated when I explained he needed the caloric intake. -1/9 patient continues to refuse to eat p.o. food will contact family to see if they would be willing to bring patient food from home. -1/10 patient happily eating his [place] fried chicken meal brought in by his family.  Depressed Mood:  -2/2 prolonged hospital stay, continue to monitor, chaplain -1/9 change Zyprexa 2.5 mg daily -1/9 Celexa 10 mg daily -1/10 TSH= WNL   Continue home prednisolone acetate eye drops   Anemia of chronic disease -1/9 anemia panel; consistent with anemia of chronic disease -1/9 occult blood pending       Discharge Diagnoses:  Active Problems:   COVID-19 virus infection   Pneumonia due to COVID-19 virus   Acute respiratory failure with hypoxia (HCC)   HCAP (healthcare-associated pneumonia)   Acute renal failure superimposed on stage 3b chronic kidney disease (HCC)   Essential hypertension   Visual hallucination   Anxiety   Panic attack   Elevated LFTs   Depression   Discharge Condition: Guarded  Diet recommendation: Dysphagia 3 fluid consistency thin  Filed Weights   07/22/19 0400 07/23/19 0451 07/24/19 0500  Weight: 91.6 kg 82.6 kg 84 kg    History of present illness:  Craig Avery a 79 y.o.WM PMHx CVA, HTN, HLD, carotid stenosis s/p L CEA    Presented to [facility] on 12/25 with fever and weakness. He initially had nasal congestion and scratchy throat, then developed nausea and decreased appetite. He presented to the ED after developing fevers, chills and generalized  weakness. He's been treated at [facility] with steroids and remdesivir, but due to increasing O2 requirement, he was transferred to [facility]. Hospitalization complicated by visual hallucinations and the patient was seen by psychiatry.  Hospital Course:   Hospitalization patient treated for Covid pneumonia/acute respiratory failure with hypoxia with standard protocol responded well.  Patient stay complicated by HCAP patient treated with standard protocol antibiotics responded well.  Patient's stay complicated by extremely poor p.o. intake, which resulted in patient's debilitated state however patient has refused inpatient rehab.  Safe for discharge      Cultures  07/06/2019 POC SARS coronavirus positive 1/1 acute hepatitis panel negative 1/5 HIV screen nonreactive   Antibiotics Anti-infectives (From admission, onward)   Start     Dose/Rate Stop   07/17/19 1300  ceFEPIme (MAXIPIME) 2 g in sodium chloride 0.9 % 100 mL IVPB     2 g 200 mL/hr over 30 Minutes 07/23/19 2215       Discharge Exam: Vitals:   07/23/19 1929 07/23/19 2000 07/24/19 0010 07/24/19 0500  BP:  (!) 148/61 (!) 141/61 (!) 144/54  Pulse: 93 62 (!) 57 (!) 56  Resp: (!) 23 (!) 22 (!) 23 (!) 22  Temp:  98 F (36.7 C) 97.9 F (36.6 C) (!) 97.5 F (36.4 C)  TempSrc:  Oral Oral Oral  SpO2: (!) 79% 92% 93% 92%  Weight:    84 kg  Height:        General: A/O x4, no acute respiratory distress Eyes: negative scleral hemorrhage, negative anisocoria, negative icterus ENT: Negative Runny nose, negative gingival bleeding, Neck:  Negative scars, masses, torticollis, lymphadenopathy, JVD Lungs: Clear to auscultation bilaterally without wheezes or crackles Cardiovascular: Regular rate and rhythm without murmur gallop or rub normal S1 and S2   Discharge Instructions   Allergies as of 07/24/2019   No Known Allergies     Medication List    STOP taking these medications   lisinopril-hydrochlorothiazide 20-12.5 MG tablet Commonly known as: ZESTORETIC     TAKE these medications   amLODipine 10 MG tablet Commonly known as: NORVASC TAKE 1 TABLET BY MOUTH EVERY DAY   ascorbic acid 500 MG tablet Commonly known as: VITAMIN C Take 1 tablet (500 mg total) by mouth  daily.   aspirin 81 MG tablet Take 81 mg by mouth daily.   atorvastatin 20 MG tablet Commonly known as: LIPITOR TAKE 1 TABLET BY MOUTH DAILY AT 6PM What changed:   how much to take  how to take this  when to take this   CENTRUM SILVER ADULT 50+ PO Take 1 tablet by mouth daily.   citalopram 10 MG tablet Commonly known as: CELEXA Take 1 tablet (10 mg total) by mouth daily.   cloNIDine 0.1 MG tablet Commonly known as: CATAPRES Take 1 tablet (0.1 mg total) by mouth 3 (three) times daily.   clopidogrel 75 MG tablet Commonly known as: PLAVIX TAKE 1 TABLET BY MOUTH EVERY DAY   feeding supplement (ENSURE ENLIVE) Liqd Take 237 mLs by mouth 3 (three) times daily.   guaiFENesin 600 MG 12 hr tablet Commonly known as: MUCINEX Take 2 tablets (1,200 mg total) by mouth 2 (two) times daily.   Ipratropium-Albuterol 20-100 MCG/ACT Aers respimat Commonly known as: COMBIVENT Inhale 1 puff into the lungs every 6 (six) hours.   lisinopril 2.5 MG tablet Commonly known as: ZESTRIL Take 1 tablet (2.5 mg total) by mouth daily.   OLANZapine 2.5 MG tablet Commonly known as: ZYPREXA Take 1 tablet (  2.5 mg total) by mouth daily. What changed:   when to take this  reasons to take this   prednisoLONE acetate 1 % ophthalmic suspension Commonly known as: PRED FORTE Place 1 drop into both eyes 4 (four) times daily.   prochlorperazine 10 MG tablet Commonly known as: COMPAZINE Take 1 tablet (10 mg total) by mouth daily as needed (sleep, to be given with benadryl).   VITAMIN B 12 PO Take by mouth daily.   zinc sulfate 220 (50 Zn) MG capsule Take 1 capsule (220 mg total) by mouth daily.            Durable Medical Equipment  (From admission, onward)         Start     Ordered   07/24/19 0727  For home use only DME oxygen  Once    Comments: SATURATION QUALIFICATIONS: (This note is used to comply with regulatory documentation for home oxygen) Patient Saturations on Room Air at  Rest = 91% Patient Saturations on Room Air while Ambulating = 78% Patient Saturations on 4lnc Liters of oxygen while Ambulating = 89% Please briefly explain why patient needs home oxygen: Pt O2sats <80% with ambulation on RA. Pt also desats to mid 80's with any exertion.  -Patient meets requirements for home O2 -2 L O2 via [place] titrate to maintain SPO2> 88% -Provide Inogen home portable O2 concentrator  Question Answer Comment  Length of Need 12 Months   Mode or (Route) Nasal cannula   Liters per Minute 2   Frequency Continuous (stationary and portable oxygen unit needed)   Oxygen conserving device Yes   Oxygen delivery system Gas      07/24/19 0726         No Known Allergies    The results of significant diagnostics from this hospitalization (including imaging, microbiology, ancillary and laboratory) are listed below for reference.    Significant Diagnostic Studies: DG Chest 2 View  Result Date: 07/06/2019 CLINICAL DATA:  Cough and fever.  Shortness of breath and fatigue. EXAM: CHEST - 2 VIEW COMPARISON:  07/21/2012 FINDINGS: Patchy areas of subtle airspace opacity are seen in the lungs bilaterally with a slight lower lung predominance. No pleural effusion. The cardiopericardial silhouette is within normal limits for size. The visualized bony structures of the thorax are intact. IMPRESSION: Patchy areas of subtle ground-glass opacity in both lungs without associated pleural effusion. Imaging features compatible with multifocal pneumonia and viral/atypical etiology is a consideration. Electronically Signed   By: Misty Stanley M.D.   On: 07/06/2019 12:07   DG CHEST PORT 1 VIEW  Result Date: 07/18/2019 CLINICAL DATA:  Hypoxia EXAM: PORTABLE CHEST 1 VIEW COMPARISON:  07/17/2019 FINDINGS: Right upper lobe airspace disease unchanged. Left upper lobe airspace disease also unchanged. No effusion. Heart size within normal limits. IMPRESSION: Bilateral airspace disease unchanged. Electronically  Signed   By: Franchot Gallo M.D.   On: 07/18/2019 08:42   DG CHEST PORT 1 VIEW  Result Date: 07/17/2019 CLINICAL DATA:  Pt hypoxic and desating. Covid + EXAM: PORTABLE CHEST 1 VIEW COMPARISON:  12/11/2019 FINDINGS: Bilateral upper lobe airspace disease is increased density compared to prior. No pleural fluid. No pneumothorax. No acute osseous abnormality. IMPRESSION: Increase in density of bilateral upper lobe airspace disease. Electronically Signed   By: Suzy Bouchard M.D.   On: 07/17/2019 10:18   DG CHEST PORT 1 VIEW  Result Date: 07/13/2019 CLINICAL DATA:  Hypoxia. EXAM: PORTABLE CHEST 1 VIEW COMPARISON:  07/09/2019 FINDINGS: Heart size is  mildly enlarged. Right midlung and diffuse left lung pulmonary opacities are unchanged. No pleural effusion identified. IMPRESSION: Unchanged appearance of bilateral pulmonary opacities. Electronically Signed   By: Kerby Moors M.D.   On: 07/13/2019 10:41   DG Chest Port 1 View  Result Date: 07/09/2019 CLINICAL DATA:  COVID pneumonia. EXAM: PORTABLE CHEST 1 VIEW COMPARISON:  Chest x-ray 07/06/2011 FINDINGS: The cardiac silhouette, mediastinal and hilar contours are within normal limits and stable. Persistent patchy hazy bilateral infiltrates consistent with COVID pneumonia. Findings are slightly progressive. No pleural effusions. The bony thorax is intact. IMPRESSION: Slightly progressive hazy bilateral infiltrates consistent with COVID pneumonia. Electronically Signed   By: Marijo Sanes M.D.   On: 07/09/2019 08:27   US Abdomen Limited RUQ  Result Date: 07/17/2019 CLINICAL DATA:  79 year old male COVID-19.  Elevated liver enzymes. EXAM: ULTRASOUND ABDOMEN LIMITED RIGHT UPPER QUADRANT COMPARISON:  None. FINDINGS: Gallbladder: No gallstones or wall thickening visualized. No sonographic Murphy sign noted by sonographer. Common bile duct: Diameter: 3 millimeters, normal. Liver: Background liver echogenicity is within normal limits (image 22). No discrete liver  lesion. No intrahepatic biliary ductal dilatation. Portal vein is patent on color Doppler imaging with normal direction of blood flow towards the liver. Other: Negative visible right kidney. IMPRESSION: Right upper quadrant ultrasound is within normal limits. Electronically Signed   By: Genevie Ann M.D.   On: 07/17/2019 09:47    Microbiology: Recent Results (from the past 240 hour(s))  MRSA PCR Screening     Status: None   Collection Time: 07/17/19  5:41 PM   Specimen: Nasal Mucosa; Nasopharyngeal  Result Value Ref Range Status   MRSA by PCR NEGATIVE NEGATIVE Final    Comment:        The GeneXpert MRSA Assay (FDA approved for NASAL specimens only), is one component of a comprehensive MRSA colonization surveillance program. It is not intended to diagnose MRSA infection nor to guide or monitor treatment for MRSA infections. Performed at [facility], [place] [address] [place], [place] [address]      Labs: Basic Metabolic Panel: Recent Labs  Lab 07/18/19 0125 07/18/19 1717 07/19/19 0015 07/19/19 1637 07/20/19 0020 07/21/19 0449 07/22/19 0501 07/23/19 0443  NA 138  --  138  --  138 139 140 140  K 4.5  --  4.7  --  4.8 4.7 4.6 4.4  CL 104  --  105  --  104 105 106 103  CO2 22  --  23  --  22 24 26 23   GLUCOSE 158*  --  173*  --  188* 169* 148* 142*  BUN 60*  --  59*  --  63* 64* 69* 62*  CREATININE 1.30*  --  1.30*  --  1.18 1.21 1.26* 1.17  CALCIUM 8.6*  --  8.8*  --  9.0 8.9 8.9 8.5*  MG 2.4 2.2 2.1 1.9 1.8  --   --   --   PHOS 2.8 2.8 3.2 2.8 3.1  --   --   --    Liver Function Tests: Recent Labs  Lab 07/19/19 0015 07/20/19 0020 07/21/19 0449 07/22/19 0501 07/23/19 0443  AST 61* 49* 40 52* 57*  ALT 175* 169* 148* 162* 174*  ALKPHOS 50 48 47 44 48  BILITOT 0.5 0.9 0.4 0.8 0.9  PROT 5.9* 5.8* 5.5* 5.4* 5.5*  ALBUMIN 2.3* 2.5* 2.4* 2.4* 2.4*   No results for input(s): LIPASE, AMYLASE in the last 168 hours. No results for input(s):  AMMONIA in the  last 168 hours. CBC: Recent Labs  Lab 07/19/19 0015 07/20/19 0020 07/21/19 0449 07/22/19 0501 07/23/19 0443  WBC 9.0 9.7 11.2* 10.5 11.5*  NEUTROABS 7.8* 8.3* 9.1* 8.4* 9.2*  HGB 10.4* 9.9* 9.8* 9.7* 10.0*  HCT 31.9* 30.0* 29.2* 29.4* 30.2*  MCV 91.4 93.2 93.3 93.6 93.5  PLT 318 298 280 269 253   Cardiac Enzymes: No results for input(s): CKTOTAL, CKMB, CKMBINDEX, TROPONINI in the last 168 hours. BNP: BNP (last 3 results) Recent Labs    07/06/19 1133  BNP 31.0    ProBNP (last 3 results) No results for input(s): PROBNP in the last 8760 hours.  CBG: Recent Labs  Lab 07/23/19 1226 07/23/19 1550 07/23/19 2138 07/24/19 0007 07/24/19 0528  GLUCAP 114* 134* 124* 137* 134*       Signed:  Dia Crawford, MD [facility] [phone] pager

## 2019-07-24 NOTE — TOC Transition Note (Signed)
Transition of Care [facility]) - CM/SW Discharge Note   Patient Details  Name: Craig Avery MRN: [id number] Date of Birth: 09/21/1940  Transition of Care [facility]) CM/SW Contact:  Shade Flood, LCSW Phone Number: 07/24/2019, 8:17 AM   Clinical Narrative:     Pt with dc orders. Pt needs Home O2. Referred to Zack at [employer]. He will bring portable Oxygen Concentrator to campus for pt. Once it is there, pt can dc home. Plan is for pt's son in law to pick him up. Santiago Glad from [facility] is aware of pt's dc.  Will update pt's RN.  There are no other TOC needs identified.   Final next level of care: [place] Barriers to Discharge: Barriers Resolved   Patient Goals and CMS Choice     Choice offered to / list presented to : Patient  Discharge Placement                       Discharge Plan and Services In-house Referral: Clinical Social Work   Post Acute Care Choice: Home Health          DME Arranged: Oxygen DME Agency: [facility] Date DME Agency Contacted: 07/24/19 Time DME Agency Contacted: [phone] Representative spoke with at DME Agency: [place]: PT, OT [place] Agency: [place] ([place]) Date [place]: 07/16/19 Time [place]: [place] Representative spoke with at [place]: [place] ([place]) Interventions     Readmission Risk Interventions Readmission Risk Prevention Plan 07/16/2019 07/11/2019  Transportation Screening Complete -  Home Care Screening Complete -  Medication Review (RN CM) Complete -  Medication Review Press photographer) - Complete  Some recent data might be hidden

## 2019-07-24 NOTE — Telephone Encounter (Signed)
Noted prolonged hospitalization due to covid pneumonia. Will await discharge (planned next few days) and call for TCM hosp f/u phone call.

## 2019-07-25 ENCOUNTER — Telehealth: Payer: Self-pay

## 2019-07-25 NOTE — Telephone Encounter (Signed)
Transition Care Management Follow-up Telephone Call  Date of discharge and from where:   07/24/2019, [facility]  How have you been since you were released from the hospital? Patient states that he is doing fantastic. No symptoms noted currently.   Any questions or concerns? No   Items Reviewed:  Did the pt receive and understand the discharge instructions provided? Yes   Medications obtained and verified? Yes   Any new allergies since your discharge? No   Dietary orders reviewed? Yes  Do you have support at home? Yes   Functional Questionnaire: (I = Independent and D = Dependent) ADLs: I  Bathing/Dressing- I  Meal Prep- I  Eating- I  Maintaining continence- I  Transferring/Ambulation- I  Managing Meds- I  Follow up appointments reviewed:   PCP Hospital f/u appt confirmed? Yes  Scheduled to see Dr. Danise Mina on 08/07/2019 @ 11:30 am virtually.  [facility] f/u appt confirmed? N/A   Are transportation arrangements needed? No   If their condition worsens, is the pt aware to call PCP or go to the Emergency Dept.? Yes  Was the patient provided with contact information for the PCP's office or ED? Yes  Was to pt encouraged to call back with questions or concerns? Yes

## 2019-07-27 ENCOUNTER — Telehealth: Payer: Self-pay | Admitting: Family Medicine

## 2019-07-27 DIAGNOSIS — F410 Panic disorder [episodic paroxysmal anxiety] without agoraphobia: Secondary | ICD-10-CM | POA: Diagnosis not present

## 2019-07-27 DIAGNOSIS — R441 Visual hallucinations: Secondary | ICD-10-CM | POA: Diagnosis not present

## 2019-07-27 DIAGNOSIS — Z7902 Long term (current) use of antithrombotics/antiplatelets: Secondary | ICD-10-CM | POA: Diagnosis not present

## 2019-07-27 DIAGNOSIS — I129 Hypertensive chronic kidney disease with stage 1 through stage 4 chronic kidney disease, or unspecified chronic kidney disease: Secondary | ICD-10-CM | POA: Diagnosis not present

## 2019-07-27 DIAGNOSIS — Z8673 Personal history of transient ischemic attack (TIA), and cerebral infarction without residual deficits: Secondary | ICD-10-CM | POA: Diagnosis not present

## 2019-07-27 DIAGNOSIS — Z87891 Personal history of nicotine dependence: Secondary | ICD-10-CM | POA: Diagnosis not present

## 2019-07-27 DIAGNOSIS — I739 Peripheral vascular disease, unspecified: Secondary | ICD-10-CM | POA: Diagnosis not present

## 2019-07-27 DIAGNOSIS — D631 Anemia in chronic kidney disease: Secondary | ICD-10-CM | POA: Diagnosis not present

## 2019-07-27 DIAGNOSIS — Z7982 Long term (current) use of aspirin: Secondary | ICD-10-CM | POA: Diagnosis not present

## 2019-07-27 DIAGNOSIS — N1832 Chronic kidney disease, stage 3b: Secondary | ICD-10-CM | POA: Diagnosis not present

## 2019-07-27 DIAGNOSIS — E785 Hyperlipidemia, unspecified: Secondary | ICD-10-CM | POA: Diagnosis not present

## 2019-07-27 DIAGNOSIS — U071 COVID-19: Secondary | ICD-10-CM | POA: Diagnosis not present

## 2019-07-27 DIAGNOSIS — Z9981 Dependence on supplemental oxygen: Secondary | ICD-10-CM | POA: Diagnosis not present

## 2019-07-27 DIAGNOSIS — F329 Major depressive disorder, single episode, unspecified: Secondary | ICD-10-CM | POA: Diagnosis not present

## 2019-07-27 DIAGNOSIS — J1282 Pneumonia due to coronavirus disease 2019: Secondary | ICD-10-CM | POA: Diagnosis not present

## 2019-07-27 DIAGNOSIS — J9601 Acute respiratory failure with hypoxia: Secondary | ICD-10-CM | POA: Diagnosis not present

## 2019-07-27 DIAGNOSIS — I6523 Occlusion and stenosis of bilateral carotid arteries: Secondary | ICD-10-CM | POA: Diagnosis not present

## 2019-07-27 MED ORDER — IPRATROPIUM-ALBUTEROL 20-100 MCG/ACT IN AERS: 1.0000 | INHALATION_SPRAY | Freq: Four times a day (QID) | RESPIRATORY_TRACT | 3 refills | Status: DC

## 2019-07-27 MED ORDER — LISINOPRIL 2.5 MG PO TABS: 2.5000 mg | ORAL_TABLET | Freq: Every day | ORAL | 6 refills | Status: DC

## 2019-07-27 MED ORDER — CLONIDINE HCL 0.1 MG PO TABS: 0.1000 mg | ORAL_TABLET | Freq: Three times a day (TID) | ORAL | 3 refills | Status: DC

## 2019-07-27 NOTE — Addendum Note (Signed)
Addended by: [name] on: 07/27/2019 04:15 PM   Modules accepted: Orders

## 2019-07-27 NOTE — Telephone Encounter (Signed)
Agree with PT orders. He should be on:  Amlodipine 10mg , clonidine 0.1mg  tid, lisinopril 2.5mg  only no longer on hctz as had kidney insufficiency in hospital.  Olanzapine is PRN hallucinations.  Citalopram 10mg  was started in hospital for depression - which could have been situational from being in the hospital, may be doing better at home and not need celexa.  Is he having breathing difficulty? If so would refill combivent.   He had 1 month of all meds printed out on discharge. Let me know if he needs refills sent for BP meds

## 2019-07-27 NOTE — Telephone Encounter (Signed)
combivent refilled.  plz encourage him to fill and use this

## 2019-07-27 NOTE — Telephone Encounter (Signed)
Spoke with Erline Levine informing her Dr. Darnell Level is giving verbal orders for services requested.  Relayed Dr. Synthia Innocent message.  She verbalizes understanding.  Per Erline Levine, Craig Avery is having breathing difficulty.  States Craig Avery's SpO2 is 92% at rest on 3 L O2 (supposed to be on 2L).  And after Craig Avery walked 10 ft and 10 ft back, sat dropped to 83% and took a few mins to increase back to 92% at rest.  Says Craig Avery needs the Combivent refill.

## 2019-07-27 NOTE — Telephone Encounter (Signed)
Erline Levine, [facility],called.  Patient is on 4 medications that isn't in the home. The medications are Citalopram, Clonidine, Combivent, Olanzapine.  Patient has been taking Lisinopril w/ HCTZ in it, but the hospital changed it to just Lisinopril. Patient's taking Lisinopril w/ HCTZ. Erline Levine needs an order for physical therapy 2 x 4,1 x 3.

## 2019-07-28 DIAGNOSIS — I129 Hypertensive chronic kidney disease with stage 1 through stage 4 chronic kidney disease, or unspecified chronic kidney disease: Secondary | ICD-10-CM | POA: Diagnosis not present

## 2019-07-28 DIAGNOSIS — D631 Anemia in chronic kidney disease: Secondary | ICD-10-CM | POA: Diagnosis not present

## 2019-07-28 DIAGNOSIS — J1282 Pneumonia due to coronavirus disease 2019: Secondary | ICD-10-CM | POA: Diagnosis not present

## 2019-07-28 DIAGNOSIS — U071 COVID-19: Secondary | ICD-10-CM | POA: Diagnosis not present

## 2019-07-28 DIAGNOSIS — N1832 Chronic kidney disease, stage 3b: Secondary | ICD-10-CM | POA: Diagnosis not present

## 2019-07-28 DIAGNOSIS — J9601 Acute respiratory failure with hypoxia: Secondary | ICD-10-CM | POA: Diagnosis not present

## 2019-07-30 ENCOUNTER — Other Ambulatory Visit: Payer: Self-pay | Admitting: *Deleted

## 2019-07-30 ENCOUNTER — Telehealth: Payer: Self-pay | Admitting: *Deleted

## 2019-07-30 MED ORDER — ALBUTEROL SULFATE HFA 108 (90 BASE) MCG/ACT IN AERS: 2.0000 | INHALATION_SPRAY | Freq: Four times a day (QID) | RESPIRATORY_TRACT | 3 refills | Status: DC | PRN

## 2019-07-30 MED ORDER — SPIRIVA HANDIHALER 18 MCG IN CAPS: 18.0000 ug | ORAL_CAPSULE | Freq: Every day | RESPIRATORY_TRACT | 6 refills | Status: DC

## 2019-07-30 NOTE — Patient Outreach (Signed)
Telephone outreach in response to a red flag on an Emmi post discharge call regarding pt medications.  Mr. Craig Avery reports he has talked with his doctor and got his questions answered regarding his discharge medication list.  He reports he is doing a lot better. He will see Dr. Danise Mina next week.  No further needs.  Eulah Pont. Myrtie Neither, MSN, [facility] Gerontological Nurse Practitioner [facility] Care Management [phone]

## 2019-07-30 NOTE — Telephone Encounter (Signed)
Patient called wanting to know why combivent was prescribed. Advised patient that Marzetta Board called with his oxygen levels. Patient stated that he was not using this in the hospital and was wondering why he needed it now? Patient stated that with his insurance paying their part the combivent will cost him out of pocket over $400. Patient wants to know if Dr. Danise Mina can prescribe something not as expensive? Pharmacy [facility]

## 2019-07-30 NOTE — Telephone Encounter (Signed)
Spoke with pt relaying Dr. G's message. Pt verbalizes understanding.  

## 2019-07-30 NOTE — Telephone Encounter (Signed)
Price out daily spiriva, then may use albuterol inh PRN.  Both sent to pharmacy.

## 2019-07-30 NOTE — Telephone Encounter (Signed)
DPR mailed

## 2019-07-30 NOTE — Addendum Note (Signed)
Addended by: [name] on: 07/30/2019 02:03 PM   Modules accepted: Orders

## 2019-07-30 NOTE — Telephone Encounter (Signed)
Merry Proud Ward patient's son-in-law called stating that he is helping take care of Mr. Stec. Advised Mr.. Ward that he is not on his DPR. Mr. Leonides Schanz requested that a DPR form be mailed to his father-in-law for the patient to complete and add him since he is helping take care of him.  Please mail a DPR to patient's home address.

## 2019-07-31 ENCOUNTER — Telehealth: Payer: Self-pay

## 2019-07-31 DIAGNOSIS — J1282 Pneumonia due to coronavirus disease 2019: Secondary | ICD-10-CM | POA: Diagnosis not present

## 2019-07-31 DIAGNOSIS — I129 Hypertensive chronic kidney disease with stage 1 through stage 4 chronic kidney disease, or unspecified chronic kidney disease: Secondary | ICD-10-CM | POA: Diagnosis not present

## 2019-07-31 DIAGNOSIS — U071 COVID-19: Secondary | ICD-10-CM | POA: Diagnosis not present

## 2019-07-31 DIAGNOSIS — D631 Anemia in chronic kidney disease: Secondary | ICD-10-CM | POA: Diagnosis not present

## 2019-07-31 DIAGNOSIS — N1832 Chronic kidney disease, stage 3b: Secondary | ICD-10-CM | POA: Diagnosis not present

## 2019-07-31 DIAGNOSIS — J9601 Acute respiratory failure with hypoxia: Secondary | ICD-10-CM | POA: Diagnosis not present

## 2019-07-31 NOTE — Telephone Encounter (Signed)
Mardene Celeste with home health contacted the office stating she needs a verbal order for skilled nursing to come out for medication management and oxygen saturation monitoring - once a week for 5 weeks - Dr. Damita Dunnings, will you ok this is Dr. Synthia Innocent absence?

## 2019-08-01 DIAGNOSIS — J9601 Acute respiratory failure with hypoxia: Secondary | ICD-10-CM | POA: Diagnosis not present

## 2019-08-01 DIAGNOSIS — J1282 Pneumonia due to coronavirus disease 2019: Secondary | ICD-10-CM | POA: Diagnosis not present

## 2019-08-01 DIAGNOSIS — I129 Hypertensive chronic kidney disease with stage 1 through stage 4 chronic kidney disease, or unspecified chronic kidney disease: Secondary | ICD-10-CM | POA: Diagnosis not present

## 2019-08-01 DIAGNOSIS — N1832 Chronic kidney disease, stage 3b: Secondary | ICD-10-CM | POA: Diagnosis not present

## 2019-08-01 DIAGNOSIS — U071 COVID-19: Secondary | ICD-10-CM | POA: Diagnosis not present

## 2019-08-01 DIAGNOSIS — D631 Anemia in chronic kidney disease: Secondary | ICD-10-CM | POA: Diagnosis not present

## 2019-08-01 NOTE — Telephone Encounter (Signed)
Ok to do this. Thank you.  

## 2019-08-01 NOTE — Telephone Encounter (Signed)
Mardene Celeste notified that this is ok per Dr. Synthia Innocent verbal orders. Thanks!

## 2019-08-02 ENCOUNTER — Telehealth: Payer: Self-pay | Admitting: Family Medicine

## 2019-08-02 NOTE — Telephone Encounter (Signed)
Received a call from an Occupational Therapist with [facility] - left a voicemail.  I was not able to understand that call back name left on the voicemail.  Requesting a call back for Verbal Orders for OT services 1 x week x 1 week 2 x week x 2 weeks

## 2019-08-03 DIAGNOSIS — N1832 Chronic kidney disease, stage 3b: Secondary | ICD-10-CM | POA: Diagnosis not present

## 2019-08-03 DIAGNOSIS — I129 Hypertensive chronic kidney disease with stage 1 through stage 4 chronic kidney disease, or unspecified chronic kidney disease: Secondary | ICD-10-CM | POA: Diagnosis not present

## 2019-08-03 DIAGNOSIS — D631 Anemia in chronic kidney disease: Secondary | ICD-10-CM | POA: Diagnosis not present

## 2019-08-03 DIAGNOSIS — J9601 Acute respiratory failure with hypoxia: Secondary | ICD-10-CM | POA: Diagnosis not present

## 2019-08-03 DIAGNOSIS — J1282 Pneumonia due to coronavirus disease 2019: Secondary | ICD-10-CM | POA: Diagnosis not present

## 2019-08-03 DIAGNOSIS — U071 COVID-19: Secondary | ICD-10-CM | POA: Diagnosis not present

## 2019-08-03 NOTE — Telephone Encounter (Signed)
Spoke with [facility] notifying them Dr. Darnell Level is giving verbal orders for services requested.

## 2019-08-03 NOTE — Telephone Encounter (Signed)
Agree with this. Thanks.  

## 2019-08-06 DIAGNOSIS — I129 Hypertensive chronic kidney disease with stage 1 through stage 4 chronic kidney disease, or unspecified chronic kidney disease: Secondary | ICD-10-CM | POA: Diagnosis not present

## 2019-08-06 DIAGNOSIS — J9601 Acute respiratory failure with hypoxia: Secondary | ICD-10-CM | POA: Diagnosis not present

## 2019-08-06 DIAGNOSIS — J1282 Pneumonia due to coronavirus disease 2019: Secondary | ICD-10-CM | POA: Diagnosis not present

## 2019-08-06 DIAGNOSIS — N1832 Chronic kidney disease, stage 3b: Secondary | ICD-10-CM | POA: Diagnosis not present

## 2019-08-06 DIAGNOSIS — D631 Anemia in chronic kidney disease: Secondary | ICD-10-CM | POA: Diagnosis not present

## 2019-08-06 DIAGNOSIS — U071 COVID-19: Secondary | ICD-10-CM | POA: Diagnosis not present

## 2019-08-07 ENCOUNTER — Ambulatory Visit (INDEPENDENT_AMBULATORY_CARE_PROVIDER_SITE_OTHER): Payer: Medicare Other | Admitting: Family Medicine

## 2019-08-07 ENCOUNTER — Other Ambulatory Visit: Payer: Self-pay

## 2019-08-07 ENCOUNTER — Encounter: Payer: Self-pay | Admitting: Family Medicine

## 2019-08-07 ENCOUNTER — Telehealth: Payer: Self-pay | Admitting: Family Medicine

## 2019-08-07 VITALS — BP 138/78 | HR 102 | Resp 18 | Ht 64.0 in

## 2019-08-07 DIAGNOSIS — Z7982 Long term (current) use of aspirin: Secondary | ICD-10-CM

## 2019-08-07 DIAGNOSIS — F410 Panic disorder [episodic paroxysmal anxiety] without agoraphobia: Secondary | ICD-10-CM

## 2019-08-07 DIAGNOSIS — I739 Peripheral vascular disease, unspecified: Secondary | ICD-10-CM

## 2019-08-07 DIAGNOSIS — I129 Hypertensive chronic kidney disease with stage 1 through stage 4 chronic kidney disease, or unspecified chronic kidney disease: Secondary | ICD-10-CM | POA: Diagnosis not present

## 2019-08-07 DIAGNOSIS — J1282 Pneumonia due to coronavirus disease 2019: Secondary | ICD-10-CM | POA: Diagnosis not present

## 2019-08-07 DIAGNOSIS — J9601 Acute respiratory failure with hypoxia: Secondary | ICD-10-CM

## 2019-08-07 DIAGNOSIS — E66811 Obesity, class 1: Secondary | ICD-10-CM

## 2019-08-07 DIAGNOSIS — Z8673 Personal history of transient ischemic attack (TIA), and cerebral infarction without residual deficits: Secondary | ICD-10-CM

## 2019-08-07 DIAGNOSIS — E440 Moderate protein-calorie malnutrition: Secondary | ICD-10-CM

## 2019-08-07 DIAGNOSIS — N1832 Chronic kidney disease, stage 3b: Secondary | ICD-10-CM

## 2019-08-07 DIAGNOSIS — E785 Hyperlipidemia, unspecified: Secondary | ICD-10-CM

## 2019-08-07 DIAGNOSIS — R441 Visual hallucinations: Secondary | ICD-10-CM

## 2019-08-07 DIAGNOSIS — Z7902 Long term (current) use of antithrombotics/antiplatelets: Secondary | ICD-10-CM

## 2019-08-07 DIAGNOSIS — E669 Obesity, unspecified: Secondary | ICD-10-CM

## 2019-08-07 DIAGNOSIS — I6523 Occlusion and stenosis of bilateral carotid arteries: Secondary | ICD-10-CM

## 2019-08-07 DIAGNOSIS — Z9981 Dependence on supplemental oxygen: Secondary | ICD-10-CM

## 2019-08-07 DIAGNOSIS — Z87891 Personal history of nicotine dependence: Secondary | ICD-10-CM

## 2019-08-07 DIAGNOSIS — I10 Essential (primary) hypertension: Secondary | ICD-10-CM | POA: Diagnosis not present

## 2019-08-07 DIAGNOSIS — U071 COVID-19: Secondary | ICD-10-CM

## 2019-08-07 DIAGNOSIS — F429 Obsessive-compulsive disorder, unspecified: Secondary | ICD-10-CM

## 2019-08-07 DIAGNOSIS — D631 Anemia in chronic kidney disease: Secondary | ICD-10-CM

## 2019-08-07 NOTE — Telephone Encounter (Signed)
Noted! Thank you

## 2019-08-07 NOTE — Telephone Encounter (Signed)
Noted.  Entered values in Vitals for today's visit.  FYI to Dr. Darnell Level.

## 2019-08-07 NOTE — Progress Notes (Signed)
Virtual visit completed through [employer]. Due to national recommendations of social distancing due to COVID-19, a virtual visit is felt to be most appropriate for this patient at this time. Reviewed limitations of a virtual visit.   Patient location: home, SIL Merry Proud also on call with him.  Provider location: Financial controller at [facility], office If any vitals were documented, they were collected by patient at home unless specified below.    BP 138/78   Pulse (!) 102   Resp 18   Ht 5\' 4"  (1.626 m)   SpO2 93% Comment: 3L  BMI 31.79 kg/m    Ambulatory pulse ox through HHPT (07/27/2019) - 92% at rest with 3L, drops to 83% with ambulation while using oxygen, back up to 92% after resting 10 min  CC: hosp f/u visit Subjective:    Patient ID: Craig Avery, male    DOB: 15-Sep-1940, 79 y.o.   MRN: [id number]  HPI: Craig Avery is a 79 y.o. male presenting on 08/07/2019 for Hospitalization Follow-up   SIL Preston Fleeting has been helping take care of [place].   Recent prolonged hospitalization for Covid-19 pneumonia. Initial hospitalization at [facility] - transferred to [facility] due to increasing oxygen requirement. Treated with remdesivir, dexamethasone, vitamin C and zinc. Had transient visual hallucinations, thought stress related - treated with zyprexa TID PRN hallucinations per psychiatry recommendations and benadryl/compazine PRN sleep. Treated for bacterial HCAP with cefepime 7d course. Placed on celexa due to situational depressed mood while hospitalized - this improved once discharged.   He is not taking celexa or zyprexa.  Some diminished sense of taste and smell since covid.   Lipitor was held due to mild transaminitis - needs this rechecked.  Noticing more trouble with short term memory as well as progression of postural tremor since hospitalization. No tremor when sitting and resting. Walking is improving - no longer using walker. Denies shuffling gait or extremity stiffness.     Malnutrition - appetite picking up since he's been home. He is drinking at least 2 Ensure a day.   HTN - bp meds changed to amlodipine 10mg  daily, lisinopril 2.5mg  daily. He is not currently taking clonidine.   [facility] involved - SN will come this afternoon for vital check and he will call me with results.   Admit date: 07/06/2019 Transfer: 07/12/2019 to [place] Discharge date: 07/24/2019 TCM hospital f/u phone call completed: 07/25/2019  Discharge Diagnoses:  Covid pneumonia/acute respiratory failure with hypoxia   HCAP (healthcare-associated pneumonia)   Acute renal failure superimposed on stage 3b chronic kidney disease ([place])   Essential hypertension   Visual hallucination   Anxiety   Panic attack   Elevated LFTs   Depression  Discharge Condition: Guarded  Diet recommendation: Dysphagia 3 fluid consistency thin     Relevant past medical, surgical, family and social history reviewed and updated as indicated. Interim medical history since our last visit reviewed. Allergies and medications reviewed and updated. Outpatient Medications Prior to Visit  Medication Sig Dispense Refill  . albuterol (VENTOLIN HFA) 108 (90 Base) MCG/ACT inhaler Inhale 2 puffs into the lungs every 6 (six) hours as needed for wheezing or shortness of breath. 18 g 3  . amLODipine (NORVASC) 10 MG tablet TAKE 1 TABLET BY MOUTH EVERY DAY (Patient taking differently: Take 10 mg by mouth daily. ) 90 tablet 1  . ascorbic acid (VITAMIN C) 500 MG tablet Take 1 tablet (500 mg total) by mouth daily.    Marland Kitchen aspirin 81  MG tablet Take 81 mg by mouth daily.    Marland Kitchen atorvastatin (LIPITOR) 20 MG tablet TAKE 1 TABLET BY MOUTH DAILY AT 6PM (Patient taking differently: Take 20 mg by mouth daily at 6 PM. TAKE 1 TABLET BY MOUTH DAILY AT 6PM) 90 tablet 2  . clopidogrel (PLAVIX) 75 MG tablet TAKE 1 TABLET BY MOUTH EVERY DAY (Patient taking differently: Take 75 mg by mouth daily. ) 90 tablet 3  . Cyanocobalamin (VITAMIN B 12 PO)  Take by mouth daily.    . feeding supplement, ENSURE ENLIVE, (ENSURE ENLIVE) LIQD Take 237 mLs by mouth 3 (three) times daily. 237 mL 12  . guaiFENesin (MUCINEX) 600 MG 12 hr tablet Take 2 tablets (1,200 mg total) by mouth 2 (two) times daily.    Marland Kitchen lisinopril (ZESTRIL) 2.5 MG tablet Take 1 tablet (2.5 mg total) by mouth daily. 30 tablet 6  . Multiple Vitamins-Minerals (CENTRUM SILVER ADULT 50+ PO) Take 1 tablet by mouth daily.    . prednisoLONE acetate (PRED FORTE) 1 % ophthalmic suspension Place 1 drop into both eyes 4 (four) times daily.    . prochlorperazine (COMPAZINE) 10 MG tablet Take 1 tablet (10 mg total) by mouth daily as needed (sleep, to be given with benadryl). 30 tablet 0  . tiotropium (SPIRIVA HANDIHALER) 18 MCG inhalation capsule Place 1 capsule (18 mcg total) into inhaler and inhale daily. 30 capsule 6  . zinc sulfate 220 (50 Zn) MG capsule Take 1 capsule (220 mg total) by mouth daily.    . citalopram (CELEXA) 10 MG tablet Take 1 tablet (10 mg total) by mouth daily. 30 tablet 0  . OLANZapine (ZYPREXA) 2.5 MG tablet Take 1 tablet (2.5 mg total) by mouth daily. 30 tablet 0  . cloNIDine (CATAPRES) 0.1 MG tablet Take 1 tablet (0.1 mg total) by mouth 3 (three) times daily. (Patient not taking: Reported on 08/07/2019) 90 tablet 3   No facility-administered medications prior to visit.     Per HPI unless specifically indicated in ROS section below Review of Systems Objective:    BP 138/78   Pulse (!) 102   Resp 18   Ht 5\' 4"  (1.626 m)   SpO2 93% Comment: 3L  BMI 31.79 kg/m   Wt Readings from Last 3 Encounters:  07/24/19 185 lb 3 oz (84 kg)  07/06/19 203 lb 0.7 oz (92.1 kg)  05/17/19 203 lb 1 oz (92.1 kg)     Physical exam: Gen: alert, NAD, not ill appearing Pulm: speaks in complete sentences without increased work of breathing Psych: normal mood, normal thought content      Lab Results  Component Value Date   CREATININE 1.17 07/23/2019   BUN 62 (H) 07/23/2019   NA  140 07/23/2019   K 4.4 07/23/2019   CL 103 07/23/2019   CO2 23 07/23/2019    Lab Results  Component Value Date   WBC 11.5 (H) 07/23/2019   HGB 10.0 (L) 07/23/2019   HCT 30.2 (L) 07/23/2019   MCV 93.5 07/23/2019   PLT 253 07/23/2019    Lab Results  Component Value Date   HGBA1C 6.1 (H) 07/19/2019   Assessment & Plan:   Problem List Items Addressed This Visit    Protein-calorie malnutrition ([place])    As evidenced by low protein levels on last check. Discussed ensure use.       Pneumonia due to COVID-19 virus - Primary    Reviewed recent prolonged hospitalization due to covid pneumonia s/p ICU stay with  high flow oxygen requirement, fortunately did not need mechanical ventilation.       Obesity, Class I, BMI 30-34.9    18 lb weight loss since hosptialized.       Hyperlipidemia LDL goal <70    Statin currently on hold after covid - will recheck LFTs when he returns.       HTN (hypertension) (Chronic)    Chronic, reviewed med changes - now only on amlodipine 10mg  and lisinopril 2.5mg  daily. No longer on clonidine. Will watch BP control on current regimen.       History of stroke    Continues plavix, aspirin.       Acute respiratory failure with hypoxia (HCC)    Continued oxygen requirement.  RTC 2 wks in office for labs and ambulatory pulse ox and evaluate for continued oxygen requirement.           No orders of the defined types were placed in this encounter.  No orders of the defined types were placed in this encounter.   I discussed the assessment and treatment plan with the patient. The patient was provided an opportunity to ask questions and all were answered. The patient agreed with the plan and demonstrated an understanding of the instructions. The patient was advised to call back or seek an in-person evaluation if the symptoms worsen or if the condition fails to improve as anticipated.   Follow up plan: Return in about 2 weeks (around 08/21/2019) for  follow up visit.  [name], MD

## 2019-08-07 NOTE — Telephone Encounter (Signed)
Nevin Bloodgood from [facility] called with patient's vitals.  BP: 138/78 Heart Rate: 102 Resp: 18 Oxygen: 93

## 2019-08-08 ENCOUNTER — Telehealth: Payer: Self-pay | Admitting: Family Medicine

## 2019-08-08 ENCOUNTER — Ambulatory Visit: Payer: Medicare Other | Admitting: Internal Medicine

## 2019-08-08 DIAGNOSIS — U071 COVID-19: Secondary | ICD-10-CM

## 2019-08-08 DIAGNOSIS — J189 Pneumonia, unspecified organism: Secondary | ICD-10-CM

## 2019-08-08 DIAGNOSIS — J9601 Acute respiratory failure with hypoxia: Secondary | ICD-10-CM

## 2019-08-08 DIAGNOSIS — J1282 Pneumonia due to coronavirus disease 2019: Secondary | ICD-10-CM

## 2019-08-08 MED ORDER — IPRATROPIUM-ALBUTEROL 0.5-2.5 (3) MG/3ML IN SOLN: 3.0000 mL | Freq: Four times a day (QID) | RESPIRATORY_TRACT | 0 refills | Status: DC | PRN

## 2019-08-08 NOTE — Telephone Encounter (Signed)
Pt's daughter called saying that Spiriva is $586. They are wondering if there is anything else that can be prescribed.

## 2019-08-08 NOTE — Telephone Encounter (Addendum)
I have sent in duoneb solution to try in place of spiriva or combivent.  They will need a nebulizer machine as well - will see if [facility] can provide this.  Rosaria Ferries - can HH take him a nebulizer machine? [place] DME order placed. Order printed and in Lisa's box.

## 2019-08-08 NOTE — Addendum Note (Signed)
Addended by: [name] on: 08/08/2019 05:44 PM   Modules accepted: Orders

## 2019-08-09 DIAGNOSIS — I129 Hypertensive chronic kidney disease with stage 1 through stage 4 chronic kidney disease, or unspecified chronic kidney disease: Secondary | ICD-10-CM | POA: Diagnosis not present

## 2019-08-09 DIAGNOSIS — U071 COVID-19: Secondary | ICD-10-CM | POA: Diagnosis not present

## 2019-08-09 DIAGNOSIS — J1282 Pneumonia due to coronavirus disease 2019: Secondary | ICD-10-CM | POA: Diagnosis not present

## 2019-08-09 DIAGNOSIS — N1832 Chronic kidney disease, stage 3b: Secondary | ICD-10-CM | POA: Diagnosis not present

## 2019-08-09 DIAGNOSIS — D631 Anemia in chronic kidney disease: Secondary | ICD-10-CM | POA: Diagnosis not present

## 2019-08-09 DIAGNOSIS — J9601 Acute respiratory failure with hypoxia: Secondary | ICD-10-CM | POA: Diagnosis not present

## 2019-08-09 NOTE — Telephone Encounter (Signed)
[place] notify pt/family and see what they prefer then notify [employer]. Do I need to do anything else?

## 2019-08-09 NOTE — Telephone Encounter (Signed)
Per Jolee Ewing with [employer], items will have to be picked up or sent via FedEx or UPS.  FYI to Dr. Darnell Level.

## 2019-08-09 NOTE — Telephone Encounter (Signed)
Spoke with pt notifying him about duo neb soln and that [place] is working on trying to get him the nebulizer.  Pt verbalizes understanding.

## 2019-08-09 NOTE — Telephone Encounter (Signed)
Spoke with pt asking if he had been contacted about neb.  States [employer] told him it will be mailed to him.  FYI to Dr. Darnell Level.

## 2019-08-10 ENCOUNTER — Other Ambulatory Visit: Payer: Self-pay | Admitting: *Deleted

## 2019-08-10 DIAGNOSIS — N1832 Chronic kidney disease, stage 3b: Secondary | ICD-10-CM | POA: Diagnosis not present

## 2019-08-10 DIAGNOSIS — J9601 Acute respiratory failure with hypoxia: Secondary | ICD-10-CM | POA: Diagnosis not present

## 2019-08-10 DIAGNOSIS — J1282 Pneumonia due to coronavirus disease 2019: Secondary | ICD-10-CM | POA: Diagnosis not present

## 2019-08-10 DIAGNOSIS — I129 Hypertensive chronic kidney disease with stage 1 through stage 4 chronic kidney disease, or unspecified chronic kidney disease: Secondary | ICD-10-CM | POA: Diagnosis not present

## 2019-08-10 DIAGNOSIS — U071 COVID-19: Secondary | ICD-10-CM | POA: Diagnosis not present

## 2019-08-10 DIAGNOSIS — D631 Anemia in chronic kidney disease: Secondary | ICD-10-CM | POA: Diagnosis not present

## 2019-08-10 NOTE — Patient Outreach (Signed)
Red Flag on Emmi call: has not had pneumonia vaccine.  Will encourage to participate in disease management program.  Eulah Pont. Myrtie Neither, MSN, [facility] Gerontological Nurse Practitioner [facility] Care Management [phone]

## 2019-08-13 ENCOUNTER — Other Ambulatory Visit: Payer: Self-pay | Admitting: *Deleted

## 2019-08-13 ENCOUNTER — Encounter: Payer: Self-pay | Admitting: Family Medicine

## 2019-08-13 DIAGNOSIS — J1282 Pneumonia due to coronavirus disease 2019: Secondary | ICD-10-CM | POA: Diagnosis not present

## 2019-08-13 DIAGNOSIS — I129 Hypertensive chronic kidney disease with stage 1 through stage 4 chronic kidney disease, or unspecified chronic kidney disease: Secondary | ICD-10-CM | POA: Diagnosis not present

## 2019-08-13 DIAGNOSIS — N1832 Chronic kidney disease, stage 3b: Secondary | ICD-10-CM | POA: Diagnosis not present

## 2019-08-13 DIAGNOSIS — E46 Unspecified protein-calorie malnutrition: Secondary | ICD-10-CM | POA: Insufficient documentation

## 2019-08-13 DIAGNOSIS — D631 Anemia in chronic kidney disease: Secondary | ICD-10-CM | POA: Diagnosis not present

## 2019-08-13 DIAGNOSIS — J9601 Acute respiratory failure with hypoxia: Secondary | ICD-10-CM | POA: Diagnosis not present

## 2019-08-13 DIAGNOSIS — U071 COVID-19: Secondary | ICD-10-CM | POA: Diagnosis not present

## 2019-08-13 NOTE — Assessment & Plan Note (Signed)
Reviewed recent prolonged hospitalization due to covid pneumonia s/p ICU stay with high flow oxygen requirement, fortunately did not need mechanical ventilation.

## 2019-08-13 NOTE — Patient Outreach (Signed)
Telephone outreach for red flag on Emmi discharge call: Has not received a pneumonia vaccine.  Spoke with pt briefly today. He says he is doing much better and getting stronger. His home health nurse is with him at this time.  He does acknowledge that he has not received a pneumovaccine in the past.  Recommended this and the COVID vaccine when he has recovered from [place].  Encouraged him to call if he has questions and he said he would and that he appreciated the call.  Eulah Pont. Myrtie Neither, MSN, [facility] Gerontological Nurse Practitioner [facility] Care Management [phone]

## 2019-08-13 NOTE — Assessment & Plan Note (Signed)
As evidenced by low protein levels on last check. Discussed ensure use.

## 2019-08-13 NOTE — Assessment & Plan Note (Signed)
Statin currently on hold after covid - will recheck LFTs when he returns.

## 2019-08-13 NOTE — Assessment & Plan Note (Signed)
18 lb weight loss since hosptialized.

## 2019-08-13 NOTE — Assessment & Plan Note (Signed)
Chronic, reviewed med changes - now only on amlodipine 10mg  and lisinopril 2.5mg  daily. No longer on clonidine. Will watch BP control on current regimen.

## 2019-08-13 NOTE — Assessment & Plan Note (Signed)
Continues plavix, aspirin.

## 2019-08-13 NOTE — Assessment & Plan Note (Addendum)
Continued oxygen requirement.  RTC 2 wks in office for labs and ambulatory pulse ox and evaluate for continued oxygen requirement.

## 2019-08-14 DIAGNOSIS — H35372 Puckering of macula, left eye: Secondary | ICD-10-CM | POA: Diagnosis not present

## 2019-08-14 DIAGNOSIS — J9601 Acute respiratory failure with hypoxia: Secondary | ICD-10-CM | POA: Diagnosis not present

## 2019-08-14 DIAGNOSIS — J1282 Pneumonia due to coronavirus disease 2019: Secondary | ICD-10-CM | POA: Diagnosis not present

## 2019-08-14 DIAGNOSIS — N1832 Chronic kidney disease, stage 3b: Secondary | ICD-10-CM | POA: Diagnosis not present

## 2019-08-14 DIAGNOSIS — U071 COVID-19: Secondary | ICD-10-CM | POA: Diagnosis not present

## 2019-08-14 DIAGNOSIS — H35359 Cystoid macular degeneration, unspecified eye: Secondary | ICD-10-CM | POA: Diagnosis not present

## 2019-08-14 DIAGNOSIS — I129 Hypertensive chronic kidney disease with stage 1 through stage 4 chronic kidney disease, or unspecified chronic kidney disease: Secondary | ICD-10-CM | POA: Diagnosis not present

## 2019-08-14 DIAGNOSIS — D631 Anemia in chronic kidney disease: Secondary | ICD-10-CM | POA: Diagnosis not present

## 2019-08-15 DIAGNOSIS — U071 COVID-19: Secondary | ICD-10-CM | POA: Diagnosis not present

## 2019-08-15 DIAGNOSIS — N1832 Chronic kidney disease, stage 3b: Secondary | ICD-10-CM | POA: Diagnosis not present

## 2019-08-15 DIAGNOSIS — D631 Anemia in chronic kidney disease: Secondary | ICD-10-CM | POA: Diagnosis not present

## 2019-08-15 DIAGNOSIS — J9601 Acute respiratory failure with hypoxia: Secondary | ICD-10-CM | POA: Diagnosis not present

## 2019-08-15 DIAGNOSIS — J1282 Pneumonia due to coronavirus disease 2019: Secondary | ICD-10-CM | POA: Diagnosis not present

## 2019-08-15 DIAGNOSIS — I129 Hypertensive chronic kidney disease with stage 1 through stage 4 chronic kidney disease, or unspecified chronic kidney disease: Secondary | ICD-10-CM | POA: Diagnosis not present

## 2019-08-16 DIAGNOSIS — D631 Anemia in chronic kidney disease: Secondary | ICD-10-CM | POA: Diagnosis not present

## 2019-08-16 DIAGNOSIS — U071 COVID-19: Secondary | ICD-10-CM | POA: Diagnosis not present

## 2019-08-16 DIAGNOSIS — N1832 Chronic kidney disease, stage 3b: Secondary | ICD-10-CM | POA: Diagnosis not present

## 2019-08-16 DIAGNOSIS — J9601 Acute respiratory failure with hypoxia: Secondary | ICD-10-CM | POA: Diagnosis not present

## 2019-08-16 DIAGNOSIS — I129 Hypertensive chronic kidney disease with stage 1 through stage 4 chronic kidney disease, or unspecified chronic kidney disease: Secondary | ICD-10-CM | POA: Diagnosis not present

## 2019-08-16 DIAGNOSIS — J1282 Pneumonia due to coronavirus disease 2019: Secondary | ICD-10-CM | POA: Diagnosis not present

## 2019-08-20 DIAGNOSIS — J1282 Pneumonia due to coronavirus disease 2019: Secondary | ICD-10-CM | POA: Diagnosis not present

## 2019-08-20 DIAGNOSIS — J9601 Acute respiratory failure with hypoxia: Secondary | ICD-10-CM | POA: Diagnosis not present

## 2019-08-20 DIAGNOSIS — I129 Hypertensive chronic kidney disease with stage 1 through stage 4 chronic kidney disease, or unspecified chronic kidney disease: Secondary | ICD-10-CM | POA: Diagnosis not present

## 2019-08-20 DIAGNOSIS — U071 COVID-19: Secondary | ICD-10-CM | POA: Diagnosis not present

## 2019-08-20 DIAGNOSIS — N1832 Chronic kidney disease, stage 3b: Secondary | ICD-10-CM | POA: Diagnosis not present

## 2019-08-20 DIAGNOSIS — D631 Anemia in chronic kidney disease: Secondary | ICD-10-CM | POA: Diagnosis not present

## 2019-08-21 DIAGNOSIS — N1832 Chronic kidney disease, stage 3b: Secondary | ICD-10-CM | POA: Diagnosis not present

## 2019-08-21 DIAGNOSIS — I129 Hypertensive chronic kidney disease with stage 1 through stage 4 chronic kidney disease, or unspecified chronic kidney disease: Secondary | ICD-10-CM | POA: Diagnosis not present

## 2019-08-21 DIAGNOSIS — D631 Anemia in chronic kidney disease: Secondary | ICD-10-CM | POA: Diagnosis not present

## 2019-08-21 DIAGNOSIS — J9601 Acute respiratory failure with hypoxia: Secondary | ICD-10-CM | POA: Diagnosis not present

## 2019-08-21 DIAGNOSIS — J1282 Pneumonia due to coronavirus disease 2019: Secondary | ICD-10-CM | POA: Diagnosis not present

## 2019-08-21 DIAGNOSIS — U071 COVID-19: Secondary | ICD-10-CM | POA: Diagnosis not present

## 2019-08-22 ENCOUNTER — Encounter: Payer: Self-pay | Admitting: Family Medicine

## 2019-08-22 ENCOUNTER — Other Ambulatory Visit: Payer: Self-pay

## 2019-08-22 ENCOUNTER — Ambulatory Visit (INDEPENDENT_AMBULATORY_CARE_PROVIDER_SITE_OTHER)
Admission: RE | Admit: 2019-08-22 | Discharge: 2019-08-22 | Disposition: A | Discharge status: Home / Self Care | Payer: Medicare Other | Source: Ambulatory Visit | Attending: Family Medicine | Admitting: Family Medicine

## 2019-08-22 ENCOUNTER — Ambulatory Visit (INDEPENDENT_AMBULATORY_CARE_PROVIDER_SITE_OTHER): Payer: Medicare Other | Admitting: Family Medicine

## 2019-08-22 VITALS — BP 136/64 | HR 90 | Temp 98.0°F | Ht 64.0 in | Wt 186.1 lb

## 2019-08-22 DIAGNOSIS — R7989 Other specified abnormal findings of blood chemistry: Secondary | ICD-10-CM | POA: Diagnosis not present

## 2019-08-22 DIAGNOSIS — I10 Essential (primary) hypertension: Secondary | ICD-10-CM

## 2019-08-22 DIAGNOSIS — J189 Pneumonia, unspecified organism: Secondary | ICD-10-CM

## 2019-08-22 DIAGNOSIS — G933 Postviral fatigue syndrome: Secondary | ICD-10-CM

## 2019-08-22 DIAGNOSIS — E785 Hyperlipidemia, unspecified: Secondary | ICD-10-CM

## 2019-08-22 DIAGNOSIS — J1282 Pneumonia due to coronavirus disease 2019: Secondary | ICD-10-CM | POA: Diagnosis not present

## 2019-08-22 DIAGNOSIS — Z87891 Personal history of nicotine dependence: Secondary | ICD-10-CM

## 2019-08-22 DIAGNOSIS — E440 Moderate protein-calorie malnutrition: Secondary | ICD-10-CM | POA: Diagnosis not present

## 2019-08-22 DIAGNOSIS — N1831 Chronic kidney disease, stage 3a: Secondary | ICD-10-CM | POA: Diagnosis not present

## 2019-08-22 DIAGNOSIS — G9331 Postviral fatigue syndrome: Secondary | ICD-10-CM

## 2019-08-22 DIAGNOSIS — U071 COVID-19: Secondary | ICD-10-CM

## 2019-08-22 LAB — COMPREHENSIVE METABOLIC PANEL
ALT: 21 U/L (ref 0–53)
AST: 17 U/L (ref 0–37)
Albumin: 3.8 g/dL (ref 3.5–5.2)
Alkaline Phosphatase: 71 U/L (ref 39–117)
BUN: 22 mg/dL (ref 6–23)
CO2: 31 mEq/L (ref 19–32)
Calcium: 9.4 mg/dL (ref 8.4–10.5)
Chloride: 103 mEq/L (ref 96–112)
Creatinine, Ser: 1.23 mg/dL (ref 0.40–1.50)
GFR: 56.57 mL/min — ABNORMAL LOW (ref >60.00)
Glucose, Bld: 72 mg/dL (ref 70–99)
Potassium: 4.1 mEq/L (ref 3.5–5.1)
Sodium: 140 mEq/L (ref 135–145)
Total Bilirubin: 0.4 mg/dL (ref 0.2–1.2)
Total Protein: 7.4 g/dL (ref 6.0–8.3)

## 2019-08-22 LAB — CBC WITH DIFFERENTIAL/PLATELET
Basophils Absolute: 0.1 10*3/uL (ref 0.0–0.1)
Basophils Relative: 0.9 % (ref 0.0–3.0)
Eosinophils Absolute: 0.1 10*3/uL (ref 0.0–0.7)
Eosinophils Relative: 1.7 % (ref 0.0–5.0)
HCT: 32.2 % — ABNORMAL LOW (ref 39.0–52.0)
Hemoglobin: 10.4 g/dL — ABNORMAL LOW (ref 13.0–17.0)
Lymphocytes Relative: 37.7 % (ref 12.0–46.0)
Lymphs Abs: 3.3 10*3/uL (ref 0.7–4.0)
MCHC: 32.3 g/dL (ref 30.0–36.0)
MCV: 91.2 fl (ref 78.0–100.0)
Monocytes Absolute: 0.6 10*3/uL (ref 0.1–1.0)
Monocytes Relative: 6.8 % (ref 3.0–12.0)
Neutro Abs: 4.7 10*3/uL (ref 1.4–7.7)
Neutrophils Relative %: 52.9 % (ref 43.0–77.0)
Platelets: 372 10*3/uL (ref 150.0–400.0)
RBC: 3.54 Mil/uL — ABNORMAL LOW (ref 4.22–5.81)
RDW: 15.4 % (ref 11.5–15.5)
WBC: 8.9 10*3/uL (ref 4.0–10.5)

## 2019-08-22 NOTE — Patient Instructions (Addendum)
Try changing duoneb nebulizer treatments from scheduled three times a day to scheduled two times a day with third time if needed.  Xray and labs today. We will be in touch.  We walked you today with and without oxygen - you are maintaining oxygen levels at rest but continue needing supplemental oxygen with activity. Continue home oxygen use, but use only when active. Ok to take off when sitting and resting, unless you feel more short winded. May use at night as well.

## 2019-08-22 NOTE — Progress Notes (Signed)
This visit was conducted in person.  BP 136/64 (BP Location: Left Arm, Patient Position: Sitting, Cuff Size: Normal)   Pulse 90   Temp 98 F (36.7 C) (Temporal)   Ht 5\' 4"  (1.626 m)   Wt 186 lb 1 oz (84.4 kg)   SpO2 96% Comment: 3 L  BMI 31.94 kg/m    O2 sats drop to 91% off oxygen at rest.  Ambulatory pulse ox at rest drops to 86%, improved with supplemental O2.   CC: Covid PNA 2 wk f/u visit Subjective:    Patient ID: Craig Avery, male    DOB: 01-Aug-1940, 79 y.o.   MRN: [id number]  HPI: Craig Avery is a 79 y.o. male presenting on 08/22/2019 for Follow-up (Here for 2 wk f/u. )   See prior note for details.  Recent prolonged hospitalization for Covid-19 PNA (07/12/2019 - 07/24/2019).  Has felt well the past 2 weeks, but staying with post covid fatigue.  Notes ongoing trouble especially going up steps. No further pleurisy. Ongoing mild dyspnea. Continues supplemental oxygen use.  Appetite improving - drinking 1 ensure a day. Maintaining weight (previously lost weight during Covid hospitalization).   HH PT and SN remain involved.   HTN - continues amlodipine 10mg , lisinopril 2.5mg  daily and clonidine 0.1mg  TID (verified with patient by recall).  COPD - he is taking duoneb TID regularly - now has nebulizer machine at home. He is using incentive spirometer regularly.      Relevant past medical, surgical, family and social history reviewed and updated as indicated. Interim medical history since our last visit reviewed. Allergies and medications reviewed and updated. Outpatient Medications Prior to Visit  Medication Sig Dispense Refill  . albuterol (VENTOLIN HFA) 108 (90 Base) MCG/ACT inhaler Inhale 2 puffs into the lungs every 6 (six) hours as needed for wheezing or shortness of breath. 18 g 3  . amLODipine (NORVASC) 10 MG tablet TAKE 1 TABLET BY MOUTH EVERY DAY (Patient taking differently: Take 10 mg by mouth daily. ) 90 tablet 1  . ascorbic acid (VITAMIN C) 500 MG  tablet Take 1 tablet (500 mg total) by mouth daily.    Marland Kitchen aspirin 81 MG tablet Take 81 mg by mouth daily.    Marland Kitchen atorvastatin (LIPITOR) 20 MG tablet TAKE 1 TABLET BY MOUTH DAILY AT 6PM (Patient taking differently: Take 20 mg by mouth daily at 6 PM. TAKE 1 TABLET BY MOUTH DAILY AT 6PM) 90 tablet 2  . cloNIDine (CATAPRES) 0.1 MG tablet Take 1 tablet (0.1 mg total) by mouth 3 (three) times daily. 90 tablet 3  . clopidogrel (PLAVIX) 75 MG tablet TAKE 1 TABLET BY MOUTH EVERY DAY (Patient taking differently: Take 75 mg by mouth daily. ) 90 tablet 3  . Cyanocobalamin (VITAMIN B 12 PO) Take by mouth daily.    . feeding supplement, ENSURE ENLIVE, (ENSURE ENLIVE) LIQD Take 237 mLs by mouth 3 (three) times daily. 237 mL 12  . guaiFENesin (MUCINEX) 600 MG 12 hr tablet Take 2 tablets (1,200 mg total) by mouth 2 (two) times daily.    Marland Kitchen ipratropium-albuterol (DUONEB) 0.5-2.5 (3) MG/3ML SOLN Take 3 mLs by nebulization every 6 (six) hours as needed (dyspnea, wheezing, cough). 360 mL 0  . lisinopril (ZESTRIL) 2.5 MG tablet Take 1 tablet (2.5 mg total) by mouth daily. 30 tablet 6  . Multiple Vitamins-Minerals (CENTRUM SILVER ADULT 50+ PO) Take 1 tablet by mouth daily.    . prednisoLONE acetate (PRED FORTE) 1 % ophthalmic suspension  Place 1 drop into both eyes 4 (four) times daily.    . prochlorperazine (COMPAZINE) 10 MG tablet Take 1 tablet (10 mg total) by mouth daily as needed (sleep, to be given with benadryl). 30 tablet 0  . zinc sulfate 220 (50 Zn) MG capsule Take 1 capsule (220 mg total) by mouth daily.    Marland Kitchen tiotropium (SPIRIVA HANDIHALER) 18 MCG inhalation capsule Place 1 capsule (18 mcg total) into inhaler and inhale daily. 30 capsule 6   No facility-administered medications prior to visit.     Per HPI unless specifically indicated in ROS section below Review of Systems Objective:    BP 136/64 (BP Location: Left Arm, Patient Position: Sitting, Cuff Size: Normal)   Pulse 90   Temp 98 F (36.7 C)  (Temporal)   Ht 5\' 4"  (1.626 m)   Wt 186 lb 1 oz (84.4 kg)   SpO2 96% Comment: 3 L  BMI 31.94 kg/m   Wt Readings from Last 3 Encounters:  08/22/19 186 lb 1 oz (84.4 kg)  07/24/19 185 lb 3 oz (84 kg)  07/06/19 203 lb 0.7 oz (92.1 kg)    Physical Exam Vitals and nursing note reviewed.  Constitutional:      Appearance: Normal appearance. He is not ill-appearing.  Cardiovascular:     Rate and Rhythm: Normal rate and regular rhythm.     Pulses: Normal pulses.     Heart sounds: Normal heart sounds. No murmur.  Pulmonary:     Effort: Pulmonary effort is normal. No respiratory distress.     Breath sounds: Normal breath sounds. No wheezing, rhonchi or rales.     Comments: Overall good air movement Musculoskeletal:     Right lower leg: No edema.  Skin:    General: Skin is warm and dry.     Findings: No erythema or rash.  Neurological:     Mental Status: He is alert.  Psychiatric:        Mood and Affect: Mood normal.        Behavior: Behavior normal.       Results for orders placed or performed in visit on 08/22/19  Comprehensive metabolic panel  Result Value Ref Range   Sodium 140 135 - 145 mEq/L   Potassium 4.1 3.5 - 5.1 mEq/L   Chloride 103 96 - 112 mEq/L   CO2 31 19 - 32 mEq/L   Glucose, Bld 72 70 - 99 mg/dL   BUN 22 6 - 23 mg/dL   Creatinine, Ser 1.23 0.40 - 1.50 mg/dL   Total Bilirubin 0.4 0.2 - 1.2 mg/dL   Alkaline Phosphatase 71 39 - 117 U/L   AST 17 0 - 37 U/L   ALT 21 0 - 53 U/L   Total Protein 7.4 6.0 - 8.3 g/dL   Albumin 3.8 3.5 - 5.2 g/dL   GFR 56.57 (L) >60.00 mL/min   Calcium 9.4 8.4 - 10.5 mg/dL  CBC with Differential/Platelet  Result Value Ref Range   WBC 8.9 4.0 - 10.5 K/uL   RBC 3.54 (L) 4.22 - 5.81 Mil/uL   Hemoglobin 10.4 (L) 13.0 - 17.0 g/dL   HCT 32.2 (L) 39.0 - 52.0 %   MCV 91.2 78.0 - 100.0 fl   MCHC 32.3 30.0 - 36.0 g/dL   RDW 15.4 11.5 - 15.5 %   Platelets 372.0 150.0 - 400.0 K/uL   Neutrophils Relative % 52.9 43.0 - 77.0 %    Lymphocytes Relative 37.7 12.0 - 46.0 %   Monocytes  Relative 6.8 3.0 - 12.0 %   Eosinophils Relative 1.7 0.0 - 5.0 %   Basophils Relative 0.9 0.0 - 3.0 %   Neutro Abs 4.7 1.4 - 7.7 K/uL   Lymphs Abs 3.3 0.7 - 4.0 K/uL   Monocytes Absolute 0.6 0.1 - 1.0 K/uL   Eosinophils Absolute 0.1 0.0 - 0.7 K/uL   Basophils Absolute 0.1 0.0 - 0.1 K/uL    Assessment & Plan:  This visit occurred during the SARS-CoV-2 public health emergency.  Safety protocols were in place, including screening questions prior to the visit, additional usage of staff PPE, and extensive cleaning of exam room while observing appropriate contact time as indicated for disinfecting solutions.   Problem List Items Addressed This Visit    Smoking history   Protein-calorie malnutrition ([place])    Update protein /albumin - continues ensure daily.       Pneumonia due to COVID-19 virus - Primary    Slow recovery but notes improvement each day.  Continued O2 requirement noted. Continue home oxygen but with activity/exertion and at night, may take of when at rest. Update CXR today.  Ex smoker, no known COPD. Continue duonebs for now for extra support post covid PNA. Discussed frequency - will try slow taper.       Relevant Orders   DG Chest 2 View (Completed)   Hyperlipidemia LDL goal <70    Update LFTs - if normal, restart statin.       HTN (hypertension) (Chronic)    Chronic, stable. Continue current regimen of amlodipine 10mg , lisinopril 2.5mg  daily and clonidine 0.1mg  TID      HCAP (healthcare-associated pneumonia)    Completed IV cefepime course for HCAP during hospitalization - update CXR.       Fatigue    Some fatigue present prior to covid but anticipate ongoing trouble due to covid - anticipate slow recovery.       RESOLVED: Elevated LFTs   Relevant Orders   Comprehensive metabolic panel (Completed)   CBC with Differential/Platelet (Completed)   CKD (chronic kidney disease) stage 3, GFR 30-59 ml/min    Update  kidney function.           No orders of the defined types were placed in this encounter.  Orders Placed This Encounter  Procedures  . DG Chest 2 View    Standing Status:   Future    Number of Occurrences:   1    Standing Expiration Date:   10/19/2020    Order Specific Question:   Reason for Exam (SYMPTOM  OR DIAGNOSIS REQUIRED)    Answer:   f/u CXR post covid    Order Specific Question:   Preferred imaging location?    Answer:   [facility]    Order Specific Question:   Radiology Contrast Protocol - do NOT remove file path    Answer:   [email or web address]  . Comprehensive metabolic panel  . CBC with Differential/Platelet   Patient Instructions  Try changing duoneb nebulizer treatments from scheduled three times a day to scheduled two times a day with third time if needed.  Xray and labs today. We will be in touch.  We walked you today with and without oxygen - you are maintaining oxygen levels at rest but continue needing supplemental oxygen with activity. Continue home oxygen use, but use only when active. Ok to take off when sitting and resting, unless you feel more short winded. May use at night as well.    Follow  up plan: Return if symptoms worsen or fail to improve.  [name], MD

## 2019-08-23 ENCOUNTER — Ambulatory Visit: Payer: Medicare Other | Admitting: Family Medicine

## 2019-08-23 DIAGNOSIS — D631 Anemia in chronic kidney disease: Secondary | ICD-10-CM | POA: Diagnosis not present

## 2019-08-23 DIAGNOSIS — J9601 Acute respiratory failure with hypoxia: Secondary | ICD-10-CM | POA: Diagnosis not present

## 2019-08-23 DIAGNOSIS — N1832 Chronic kidney disease, stage 3b: Secondary | ICD-10-CM | POA: Diagnosis not present

## 2019-08-23 DIAGNOSIS — J1282 Pneumonia due to coronavirus disease 2019: Secondary | ICD-10-CM | POA: Diagnosis not present

## 2019-08-23 DIAGNOSIS — U071 COVID-19: Secondary | ICD-10-CM | POA: Diagnosis not present

## 2019-08-23 DIAGNOSIS — I129 Hypertensive chronic kidney disease with stage 1 through stage 4 chronic kidney disease, or unspecified chronic kidney disease: Secondary | ICD-10-CM | POA: Diagnosis not present

## 2019-08-25 NOTE — Assessment & Plan Note (Signed)
Chronic, stable. Continue current regimen of amlodipine 10mg , lisinopril 2.5mg  daily and clonidine 0.1mg  TID

## 2019-08-25 NOTE — Assessment & Plan Note (Addendum)
Completed IV cefepime course for HCAP during hospitalization - update CXR.

## 2019-08-25 NOTE — Assessment & Plan Note (Signed)
Update protein /albumin - continues ensure daily.

## 2019-08-25 NOTE — Assessment & Plan Note (Signed)
Update kidney function.  

## 2019-08-25 NOTE — Assessment & Plan Note (Addendum)
Some fatigue present prior to covid but anticipate ongoing trouble due to covid - anticipate slow recovery.

## 2019-08-25 NOTE — Assessment & Plan Note (Addendum)
Slow recovery but notes improvement each day.  Continued O2 requirement noted. Continue home oxygen but with activity/exertion and at night, may take of when at rest. Update CXR today.  Ex smoker, no known COPD. Continue duonebs for now for extra support post covid PNA. Discussed frequency - will try slow taper.

## 2019-08-25 NOTE — Assessment & Plan Note (Signed)
Update LFTs - if normal, restart statin.

## 2019-08-26 DIAGNOSIS — Z7902 Long term (current) use of antithrombotics/antiplatelets: Secondary | ICD-10-CM | POA: Diagnosis not present

## 2019-08-26 DIAGNOSIS — D631 Anemia in chronic kidney disease: Secondary | ICD-10-CM | POA: Diagnosis not present

## 2019-08-26 DIAGNOSIS — I129 Hypertensive chronic kidney disease with stage 1 through stage 4 chronic kidney disease, or unspecified chronic kidney disease: Secondary | ICD-10-CM | POA: Diagnosis not present

## 2019-08-26 DIAGNOSIS — U071 COVID-19: Secondary | ICD-10-CM | POA: Diagnosis not present

## 2019-08-26 DIAGNOSIS — F329 Major depressive disorder, single episode, unspecified: Secondary | ICD-10-CM | POA: Diagnosis not present

## 2019-08-26 DIAGNOSIS — I739 Peripheral vascular disease, unspecified: Secondary | ICD-10-CM | POA: Diagnosis not present

## 2019-08-26 DIAGNOSIS — J1282 Pneumonia due to coronavirus disease 2019: Secondary | ICD-10-CM | POA: Diagnosis not present

## 2019-08-26 DIAGNOSIS — J9601 Acute respiratory failure with hypoxia: Secondary | ICD-10-CM | POA: Diagnosis not present

## 2019-08-26 DIAGNOSIS — Z7982 Long term (current) use of aspirin: Secondary | ICD-10-CM | POA: Diagnosis not present

## 2019-08-26 DIAGNOSIS — E785 Hyperlipidemia, unspecified: Secondary | ICD-10-CM | POA: Diagnosis not present

## 2019-08-26 DIAGNOSIS — F410 Panic disorder [episodic paroxysmal anxiety] without agoraphobia: Secondary | ICD-10-CM | POA: Diagnosis not present

## 2019-08-26 DIAGNOSIS — Z87891 Personal history of nicotine dependence: Secondary | ICD-10-CM | POA: Diagnosis not present

## 2019-08-26 DIAGNOSIS — Z8673 Personal history of transient ischemic attack (TIA), and cerebral infarction without residual deficits: Secondary | ICD-10-CM | POA: Diagnosis not present

## 2019-08-26 DIAGNOSIS — N1832 Chronic kidney disease, stage 3b: Secondary | ICD-10-CM | POA: Diagnosis not present

## 2019-08-26 DIAGNOSIS — R441 Visual hallucinations: Secondary | ICD-10-CM | POA: Diagnosis not present

## 2019-08-26 DIAGNOSIS — I6523 Occlusion and stenosis of bilateral carotid arteries: Secondary | ICD-10-CM | POA: Diagnosis not present

## 2019-08-26 DIAGNOSIS — Z9981 Dependence on supplemental oxygen: Secondary | ICD-10-CM | POA: Diagnosis not present

## 2019-08-27 ENCOUNTER — Encounter: Payer: Self-pay | Admitting: Lab

## 2019-08-27 DIAGNOSIS — J9601 Acute respiratory failure with hypoxia: Secondary | ICD-10-CM | POA: Diagnosis not present

## 2019-08-27 DIAGNOSIS — U071 COVID-19: Secondary | ICD-10-CM | POA: Diagnosis not present

## 2019-08-27 DIAGNOSIS — D631 Anemia in chronic kidney disease: Secondary | ICD-10-CM | POA: Diagnosis not present

## 2019-08-27 DIAGNOSIS — N1832 Chronic kidney disease, stage 3b: Secondary | ICD-10-CM | POA: Diagnosis not present

## 2019-08-27 DIAGNOSIS — I129 Hypertensive chronic kidney disease with stage 1 through stage 4 chronic kidney disease, or unspecified chronic kidney disease: Secondary | ICD-10-CM | POA: Diagnosis not present

## 2019-08-27 DIAGNOSIS — J1282 Pneumonia due to coronavirus disease 2019: Secondary | ICD-10-CM | POA: Diagnosis not present

## 2019-08-28 DIAGNOSIS — J9601 Acute respiratory failure with hypoxia: Secondary | ICD-10-CM | POA: Diagnosis not present

## 2019-08-28 DIAGNOSIS — U071 COVID-19: Secondary | ICD-10-CM | POA: Diagnosis not present

## 2019-08-28 DIAGNOSIS — D631 Anemia in chronic kidney disease: Secondary | ICD-10-CM | POA: Diagnosis not present

## 2019-08-28 DIAGNOSIS — I129 Hypertensive chronic kidney disease with stage 1 through stage 4 chronic kidney disease, or unspecified chronic kidney disease: Secondary | ICD-10-CM | POA: Diagnosis not present

## 2019-08-28 DIAGNOSIS — N1832 Chronic kidney disease, stage 3b: Secondary | ICD-10-CM | POA: Diagnosis not present

## 2019-08-28 DIAGNOSIS — J1282 Pneumonia due to coronavirus disease 2019: Secondary | ICD-10-CM | POA: Diagnosis not present

## 2019-09-01 ENCOUNTER — Other Ambulatory Visit: Payer: Self-pay | Admitting: Family Medicine

## 2019-09-03 ENCOUNTER — Other Ambulatory Visit: Payer: Self-pay

## 2019-09-03 ENCOUNTER — Ambulatory Visit (INDEPENDENT_AMBULATORY_CARE_PROVIDER_SITE_OTHER): Payer: Medicare Other | Admitting: Internal Medicine

## 2019-09-03 ENCOUNTER — Encounter: Payer: Self-pay | Admitting: Internal Medicine

## 2019-09-03 VITALS — BP 142/78 | HR 92 | Ht 65.0 in | Wt 186.0 lb

## 2019-09-03 DIAGNOSIS — I129 Hypertensive chronic kidney disease with stage 1 through stage 4 chronic kidney disease, or unspecified chronic kidney disease: Secondary | ICD-10-CM | POA: Diagnosis not present

## 2019-09-03 DIAGNOSIS — E785 Hyperlipidemia, unspecified: Secondary | ICD-10-CM

## 2019-09-03 DIAGNOSIS — J1282 Pneumonia due to coronavirus disease 2019: Secondary | ICD-10-CM | POA: Diagnosis not present

## 2019-09-03 DIAGNOSIS — I739 Peripheral vascular disease, unspecified: Secondary | ICD-10-CM | POA: Diagnosis not present

## 2019-09-03 DIAGNOSIS — I10 Essential (primary) hypertension: Secondary | ICD-10-CM

## 2019-09-03 DIAGNOSIS — D631 Anemia in chronic kidney disease: Secondary | ICD-10-CM | POA: Diagnosis not present

## 2019-09-03 DIAGNOSIS — U071 COVID-19: Secondary | ICD-10-CM | POA: Diagnosis not present

## 2019-09-03 DIAGNOSIS — N1832 Chronic kidney disease, stage 3b: Secondary | ICD-10-CM | POA: Diagnosis not present

## 2019-09-03 DIAGNOSIS — J9601 Acute respiratory failure with hypoxia: Secondary | ICD-10-CM | POA: Diagnosis not present

## 2019-09-03 NOTE — Patient Instructions (Addendum)
Medication Instructions:  Your physician recommends that you continue on your current medications as directed. Please refer to the Current Medication list given to you today.  *If you need a refill on your cardiac medications before your next appointment, please call your pharmacy*  Lab Work: none If you have labs (blood work) drawn today and your tests are completely normal, you will receive your results only by: Marland Kitchen MyChart Message (if you have MyChart) OR . A paper copy in the mail If you have any lab test that is abnormal or we need to change your treatment, we will call you to review the results.  Testing/Procedures: none  Follow-Up: At [facility], you and your health needs are our priority.  As part of our continuing mission to provide you with exceptional heart care, we have created designated Provider Care Teams.  These Care Teams include your primary Cardiologist (physician) and Advanced Practice Providers (APPs -  Physician Assistants and Nurse Practitioners) who all work together to provide you with the care you need, when you need it.  Your next appointment:   12 month(s)  The format for your next appointment:   In Person  Provider:    You may see DR Harrell Gave END or one of the following Advanced Practice Providers on your designated Care Team:    Murray Hodgkins, NP  Christell Faith, PA-C  Marrianne Mood, PA-C  Keep appointment with Dr Rockey Situ as well.

## 2019-09-03 NOTE — Telephone Encounter (Signed)
Spoke with pt asking if he still uses Duoneb.  Confirms he does but down to BID.  Says he has plenty and does not need any refills at this time.   Refill denied.

## 2019-09-03 NOTE — Progress Notes (Signed)
Follow-up Outpatient Visit Date: 09/03/2019  Primary Care Provider: [name], MD [place] [address]   Primary Cardiologist: Esmond Plants, MD PhD  Chief Complaint: .Follow-up PAD  HPI: ollowup Craig Avery is a 79 y.o. male with history of carotid artery stenosis complicated by stroke status post left carotid endarterectomy, peripheral vascular disease with moderate bilateral lower extremity outflow disease, hypertension, hyperlipidemia, and obesity, who presents for follow-up of PAD.  I last saw him in 12/2018, which time he reported stable exertional pain with ambulation in both calves, consistent with Rutherford class II claudication.  We discussed angiography (conventional versus CT) and addition of cilostazol, though Craig Avery wished to defer both.  He has continued to follow with Dr. Rockey Situ for management of his other cardiovascular disease.  He contracted COVID-19 in 06/2019 and required hospitalization for hypoxia.  Today, Craig Avery reports that he is still recovering from COVID-19 infection in December.  He is almost off supplemental oxygen but still uses it some at home.  He notes gradually improving exertional dyspnea.  He has not had any chest pain, palpitations, or lightheadedness.  Chronic calf pain with walking is unchanged.  He is able to walk at least 200 yards before needing to stop to rest because of leg pain.  Discomfort resolves quickly, and less than 5 minutes.  He does not have any wounds or sores on his feet.  He is not have any pain at rest.  He remains with his medications and is tolerating them well.  --------------------------------------------------------------------------------------------------  Cardiovascular History & Procedures: Cardiovascular Problems:  Peripheral vascular disease with claudication  Stroke with carotid artery stenosis  Risk Factors:  Peripheral vascular disease, hypertension, hyperlipidemia, male  gender, obesity,and age greater than 56  Cath/PCI:  None  CV Surgery:  Left carotid endarterectomy (07/21/2012, Dr. Trula Slade)  EP Procedures and Devices:  None  Non-Invasive Evaluation(s):  BLE arterial Dopplers with ABIs (12/02/2017): 50 to 74% stenosis in the right CFA. 30 to 49% stenosis in the right SFA. 30 to 49% stenosis in the left popliteal artery. ABIs are normal bilaterally. TBI's are mildly abnormal bilaterally.  AAA screen (09/30/2017): Normal sized abdominal aorta. Bilateral iliac disease greater than 50%, extending to the proximal common femoral artery on the right.  Exercise MPI (05/05/2017): Low risk study without ischemia. Apical thinning versus attenuation artifact noted. LVEF 67%.  Carotid Doppler (02/28/2017): Right internal carotid artery with 1 to 39% stenosis. Patent left carotid endarterectomy site without restenosis. Right external carotid artery stenosis noted. Overall, no change since 02/2016.  Recent CV Pertinent Labs: Lab Results  Component Value Date   CHOL 147 05/17/2019   CHOL 140 09/25/2012   HDL 48.30 05/17/2019   HDL 47 09/25/2012   LDLCALC 81 05/17/2019   LDLCALC 72 09/25/2012   LDLDIRECT 74.0 07/07/2016   TRIG 89.0 05/17/2019   CHOLHDL 3 05/17/2019   INR 0.91 07/14/2012   BNP 31.0 07/06/2019   K 4.1 08/22/2019   MG 1.8 07/20/2019   BUN 22 08/22/2019   CREATININE 1.23 08/22/2019    Past medical and surgical history were reviewed and updated in EPIC.  No outpatient medications have been marked as taking for the 09/03/19 encounter (Appointment) with Tanga Gloor, Harrell Gave, MD.    Allergies: Patient has no known allergies.  Social History   Tobacco Use  . Smoking status: Former Smoker    Packs/day: 1.50    Years: 33.00    Pack years: 49.50    Types: Cigarettes  Quit date: 06/18/1994    Years since quitting: 25.2  . Smokeless tobacco: Never Used  Substance Use Topics  . Alcohol use: Yes    Alcohol/week: 4.0 - 5.0  standard drinks    Types: 4 - 5 Cans of beer per week  . Drug use: No    Family History  Problem Relation Age of Onset  . CAD Brother 73       MI  . Heart disease Brother        before age 80  . Heart attack Brother   . Hypertension Mother   . Cirrhosis Father 38       EtOHic  . Aneurysm Paternal Uncle 10  . Heart disease Daughter   . Hyperlipidemia Daughter     Review of Systems: A 12-system review of systems was performed and was negative except as noted in the HPI.  --------------------------------------------------------------------------------------------------  Physical Exam: There were no vitals taken for this visit.  General: NAD. Lungs: Mildly diminished breath sounds throughout without wheezes or crackles. Neck: No JVD or HJR. Heart: Regular rate and rhythm without murmurs. Abdomen: Soft, nontender, nondistended. Extremities: No lower extremity edema.  Pedal pulses are 1+ bilaterally. Skin: No ulceration or abrasion on either foot.  EKG: Normal sinus rhythm with inferior Q waves.  No significant change from prior tracing on 07/06/2019.  Lab Results  Component Value Date   WBC 8.9 08/22/2019   HGB 10.4 (L) 08/22/2019   HCT 32.2 (L) 08/22/2019   MCV 91.2 08/22/2019   PLT 372.0 08/22/2019    Lab Results  Component Value Date   NA 140 08/22/2019   K 4.1 08/22/2019   CL 103 08/22/2019   CO2 31 08/22/2019   BUN 22 08/22/2019   CREATININE 1.23 08/22/2019   GLUCOSE 72 08/22/2019   ALT 21 08/22/2019    Lab Results  Component Value Date   CHOL 147 05/17/2019   HDL 48.30 05/17/2019   LDLCALC 81 05/17/2019   LDLDIRECT 74.0 07/07/2016   TRIG 89.0 05/17/2019   CHOLHDL 3 05/17/2019    --------------------------------------------------------------------------------------------------  ASSESSMENT AND PLAN: Peripheral vascular disease with claudication: Craig Avery reports stable Rutherford class II claudication.  He is not have any evidence of  critical limb ischemia today.  Most recent ABIs in 12/2018 were mildly reduced on the left and normal on the right.  Given stable symptoms, we will continue with medical therapy.  This will include cilostazol, aspirin, clopidogrel, and atorvastatin.  It may be reasonable to discontinue clopidogrel in the future to lower risk for bleeding, if Dr. Rockey Situ feels this is appropriate.  Hypertension: Blood pressure well controlled today.  Continue current regimen and follow-up with Drs. Rockey Situ and Danise Mina as previously arranged.  Hyperlipidemia: LDL slightly above goal on most recent check in 05/2019.  We will continue atorvastatin 20 mg daily, though dose escalation may need to be considered if LDL remains above target.  COVID-19: Craig Avery is gradually improving from his infection and hospitalization in 06/2019.  Ongoing management per Dr. Danise Mina.  Follow-up: Return to clinic in 1 year.  Nelva Bush, MD 09/03/2019 6:53 AM

## 2019-09-04 ENCOUNTER — Encounter: Payer: Self-pay | Admitting: Internal Medicine

## 2019-09-12 DIAGNOSIS — D631 Anemia in chronic kidney disease: Secondary | ICD-10-CM | POA: Diagnosis not present

## 2019-09-12 DIAGNOSIS — J1282 Pneumonia due to coronavirus disease 2019: Secondary | ICD-10-CM | POA: Diagnosis not present

## 2019-09-12 DIAGNOSIS — N1832 Chronic kidney disease, stage 3b: Secondary | ICD-10-CM | POA: Diagnosis not present

## 2019-09-12 DIAGNOSIS — U071 COVID-19: Secondary | ICD-10-CM | POA: Diagnosis not present

## 2019-09-12 DIAGNOSIS — J9601 Acute respiratory failure with hypoxia: Secondary | ICD-10-CM | POA: Diagnosis not present

## 2019-09-12 DIAGNOSIS — I129 Hypertensive chronic kidney disease with stage 1 through stage 4 chronic kidney disease, or unspecified chronic kidney disease: Secondary | ICD-10-CM | POA: Diagnosis not present

## 2019-09-25 ENCOUNTER — Other Ambulatory Visit: Payer: Self-pay

## 2019-10-10 ENCOUNTER — Telehealth: Payer: Self-pay | Admitting: Family Medicine

## 2019-10-10 ENCOUNTER — Ambulatory Visit: Payer: Medicare Other

## 2019-10-10 DIAGNOSIS — J1282 Pneumonia due to coronavirus disease 2019: Secondary | ICD-10-CM

## 2019-10-10 NOTE — Telephone Encounter (Signed)
Spoke with pt relaying Dr. Synthia Innocent message.  Verbalizes understanding and scheduled NV today at 3:00.

## 2019-10-10 NOTE — Telephone Encounter (Signed)
Pt came in for NV.  Information documented in pt's chart in Care Coordination Note under Problem List.  FYI to Dr. Darnell Level.

## 2019-10-10 NOTE — Telephone Encounter (Signed)
Patient called today. He stated he is no longer using oxygen. He spoke with [facility] about picking this up and they stated that they could not do so until his provider reaches out to them letting them know that it can be picked up and patient is no longer using Please advise

## 2019-10-10 NOTE — Telephone Encounter (Signed)
I'm glad he's doing better and is being able to wean off oxygen.  Would recommend nurse visit for ambulatory pulse ox to ensure maintaining saturations off oxygen with activity prior to discontinuing home use. If passed, we will send request to [facility] to pick up tanks.

## 2019-10-10 NOTE — Progress Notes (Signed)
Pt here for NV today for ambulatory pulse ox to discontinue home O2 use.  The following info is documented in chart in Problem List under Care Coordination Note.   10/10/2019- Ambulatory pulse ox on RA Starting SpO2: 97% on RA Dropped to 92% while ambulating on RA Returned to 96% at rest on RA

## 2019-10-11 NOTE — Telephone Encounter (Addendum)
Noted.  [place] Msg notifying Darlina Guys, Jonn Shingles and Skeet Latch of [facility].

## 2019-10-11 NOTE — Telephone Encounter (Signed)
Thanks .  Order placed to remove oxygen.  plz send to [facility] agency.

## 2019-10-11 NOTE — Progress Notes (Signed)
Thank you. I have written order for DME removal of oxygen. Placed in chart. plz ensure [facility] agency has received it.

## 2019-10-11 NOTE — Addendum Note (Signed)
Addended by: [name] on: 10/11/2019 07:35 AM   Modules accepted: Orders

## 2019-10-22 ENCOUNTER — Encounter: Payer: Self-pay | Admitting: Family Medicine

## 2019-10-23 ENCOUNTER — Other Ambulatory Visit: Payer: Self-pay | Admitting: Family Medicine

## 2019-11-14 ENCOUNTER — Ambulatory Visit (INDEPENDENT_AMBULATORY_CARE_PROVIDER_SITE_OTHER): Payer: Medicare Other

## 2019-11-14 ENCOUNTER — Other Ambulatory Visit: Payer: Self-pay | Admitting: Family Medicine

## 2019-11-14 ENCOUNTER — Other Ambulatory Visit (INDEPENDENT_AMBULATORY_CARE_PROVIDER_SITE_OTHER): Payer: Medicare Other

## 2019-11-14 DIAGNOSIS — Z0000 Encounter for general adult medical examination without abnormal findings: Secondary | ICD-10-CM

## 2019-11-14 DIAGNOSIS — D649 Anemia, unspecified: Secondary | ICD-10-CM | POA: Insufficient documentation

## 2019-11-14 DIAGNOSIS — E785 Hyperlipidemia, unspecified: Secondary | ICD-10-CM

## 2019-11-14 DIAGNOSIS — N183 Chronic kidney disease, stage 3 unspecified: Secondary | ICD-10-CM

## 2019-11-14 LAB — CBC WITH DIFFERENTIAL/PLATELET
Basophils Absolute: 0 10*3/uL (ref 0.0–0.1)
Basophils Relative: 0.4 % (ref 0.0–3.0)
Eosinophils Absolute: 0.2 10*3/uL (ref 0.0–0.7)
Eosinophils Relative: 1.7 % (ref 0.0–5.0)
HCT: 39.2 % (ref 39.0–52.0)
Hemoglobin: 13.2 g/dL (ref 13.0–17.0)
Lymphocytes Relative: 60.6 % — ABNORMAL HIGH (ref 12.0–46.0)
Lymphs Abs: 6.1 10*3/uL — ABNORMAL HIGH (ref 0.7–4.0)
MCHC: 33.7 g/dL (ref 30.0–36.0)
MCV: 87.8 fl (ref 78.0–100.0)
Monocytes Absolute: 0.4 10*3/uL (ref 0.1–1.0)
Monocytes Relative: 4.3 % (ref 3.0–12.0)
Neutro Abs: 3.3 10*3/uL (ref 1.4–7.7)
Neutrophils Relative %: 33 % — ABNORMAL LOW (ref 43.0–77.0)
Platelets: 246 10*3/uL (ref 150.0–400.0)
RBC: 4.47 Mil/uL (ref 4.22–5.81)
RDW: 15.5 % (ref 11.5–15.5)
WBC: 10.1 10*3/uL (ref 4.0–10.5)

## 2019-11-14 LAB — COMPREHENSIVE METABOLIC PANEL
ALT: 18 U/L (ref 0–53)
AST: 18 U/L (ref 0–37)
Albumin: 4.2 g/dL (ref 3.5–5.2)
Alkaline Phosphatase: 58 U/L (ref 39–117)
BUN: 27 mg/dL — ABNORMAL HIGH (ref 6–23)
CO2: 29 mEq/L (ref 19–32)
Calcium: 9 mg/dL (ref 8.4–10.5)
Chloride: 107 mEq/L (ref 96–112)
Creatinine, Ser: 1.45 mg/dL (ref 0.40–1.50)
GFR: 46.76 mL/min — ABNORMAL LOW (ref >60.00)
Glucose, Bld: 102 mg/dL — ABNORMAL HIGH (ref 70–99)
Potassium: 4.3 mEq/L (ref 3.5–5.1)
Sodium: 142 mEq/L (ref 135–145)
Total Bilirubin: 0.4 mg/dL (ref 0.2–1.2)
Total Protein: 6.6 g/dL (ref 6.0–8.3)

## 2019-11-14 LAB — LIPID PANEL
Cholesterol: 168 mg/dL (ref 0–200)
HDL: 60.4 mg/dL (ref >39.00)
LDL Cholesterol: 91 mg/dL (ref 0–99)
NonHDL: 107.63
Total CHOL/HDL Ratio: 3
Triglycerides: 81 mg/dL (ref 0.0–149.0)
VLDL: 16.2 mg/dL (ref 0.0–40.0)

## 2019-11-14 LAB — VITAMIN D 25 HYDROXY (VIT D DEFICIENCY, FRACTURES): VITD: 47.17 ng/mL (ref 30.00–100.00)

## 2019-11-14 NOTE — Patient Instructions (Signed)
Craig Avery , Thank you for taking time to come for your Medicare Wellness Visit. I appreciate your ongoing commitment to your health goals. Please review the following plan we discussed and let me know if I can assist you in the future.   Screening recommendations/referrals: Colonoscopy: Up to date, completed 10/14/2015 Recommended yearly ophthalmology/optometry visit for glaucoma screening and checkup Recommended yearly dental visit for hygiene and checkup  Vaccinations: Influenza vaccine: Up to date, completed 05/17/2019 Pneumococcal vaccine: declined Tdap vaccine: Up to date, completed 11/16/2014 Shingles vaccine: discussed    Advanced directives: Advance directive discussed with you today. Even though you declined this today please call our office should you change your mind and we can give you the proper paperwork for you to fill out.  Conditions/risks identified: hypertension, hyperlipidemia  Next appointment: 11/19/2019 @ 11:30 am   Preventive Care 81 Years and Older, Male Preventive care refers to lifestyle choices and visits with your health care provider that can promote health and wellness. What does preventive care include?  A yearly physical exam. This is also called an annual well check.  Dental exams once or twice a year.  Routine eye exams. Ask your health care provider how often you should have your eyes checked.  Personal lifestyle choices, including:  Daily care of your teeth and gums.  Regular physical activity.  Eating a healthy diet.  Avoiding tobacco and drug use.  Limiting alcohol use.  Practicing safe sex.  Taking low doses of aspirin every day.  Taking vitamin and mineral supplements as recommended by your health care provider. What happens during an annual well check? The services and screenings done by your health care provider during your annual well check will depend on your age, overall health, lifestyle risk factors, and family history of  disease. Counseling  Your health care provider may ask you questions about your:  Alcohol use.  Tobacco use.  Drug use.  Emotional well-being.  Home and relationship well-being.  Sexual activity.  Eating habits.  History of falls.  Memory and ability to understand (cognition).  Work and work Statistician. Screening  You may have the following tests or measurements:  Height, weight, and BMI.  Blood pressure.  Lipid and cholesterol levels. These may be checked every 5 years, or more frequently if you are over 81 years old.  Skin check.  Lung cancer screening. You may have this screening every year starting at age 81 if you have a 30-pack-year history of smoking and currently smoke or have quit within the past 15 years.  Fecal occult blood test (FOBT) of the stool. You may have this test every year starting at age 81.  Flexible sigmoidoscopy or colonoscopy. You may have a sigmoidoscopy every 5 years or a colonoscopy every 10 years starting at age 81.  Prostate cancer screening. Recommendations will vary depending on your family history and other risks.  Hepatitis C blood test.  Hepatitis B blood test.  Sexually transmitted disease (STD) testing.  Diabetes screening. This is done by checking your blood sugar (glucose) after you have not eaten for a while (fasting). You may have this done every 1-3 years.  Abdominal aortic aneurysm (AAA) screening. You may need this if you are a current or former smoker.  Osteoporosis. You may be screened starting at age 81 if you are at high risk. Talk with your health care provider about your test results, treatment options, and if necessary, the need for more tests. Vaccines  Your health care provider  may recommend certain vaccines, such as:  Influenza vaccine. This is recommended every year.  Tetanus, diphtheria, and acellular pertussis (Tdap, Td) vaccine. You may need a Td booster every 10 years.  Zoster vaccine. You may  need this after age 81.  Pneumococcal 13-valent conjugate (PCV13) vaccine. One dose is recommended after age 81.  Pneumococcal polysaccharide (PPSV23) vaccine. One dose is recommended after age 81. Talk to your health care provider about which screenings and vaccines you need and how often you need them. This information is not intended to replace advice given to you by your health care provider. Make sure you discuss any questions you have with your health care provider. Document Released: 07/25/2015 Document Revised: 03/17/2016 Document Reviewed: 04/29/2015 Elsevier Interactive Patient Education  2017 [place] Prevention in the Home Falls can cause injuries. They can happen to people of all ages. There are many things you can do to make your home safe and to help prevent falls. What can I do on the outside of my home?  Regularly fix the edges of walkways and driveways and fix any cracks.  Remove anything that might make you trip as you walk through a door, such as a raised step or threshold.  Trim any bushes or trees on the path to your home.  Use bright outdoor lighting.  Clear any walking paths of anything that might make someone trip, such as rocks or tools.  Regularly check to see if handrails are loose or broken. Make sure that both sides of any steps have handrails.  Any raised decks and porches should have guardrails on the edges.  Have any leaves, snow, or ice cleared regularly.  Use sand or salt on walking paths during winter.  Clean up any spills in your garage right away. This includes oil or grease spills. What can I do in the bathroom?  Use night lights.  Install grab bars by the toilet and in the tub and shower. Do not use towel bars as grab bars.  Use non-skid mats or decals in the tub or shower.  If you need to sit down in the shower, use a plastic, non-slip stool.  Keep the floor dry. Clean up any water that spills on the floor as soon as it  happens.  Remove soap buildup in the tub or shower regularly.  Attach bath mats securely with double-sided non-slip rug tape.  Do not have throw rugs and other things on the floor that can make you trip. What can I do in the bedroom?  Use night lights.  Make sure that you have a light by your bed that is easy to reach.  Do not use any sheets or blankets that are too big for your bed. They should not hang down onto the floor.  Have a firm chair that has side arms. You can use this for support while you get dressed.  Do not have throw rugs and other things on the floor that can make you trip. What can I do in the kitchen?  Clean up any spills right away.  Avoid walking on wet floors.  Keep items that you use a lot in easy-to-reach places.  If you need to reach something above you, use a strong step stool that has a grab bar.  Keep electrical cords out of the way.  Do not use floor polish or wax that makes floors slippery. If you must use wax, use non-skid floor wax.  Do not have throw rugs  and other things on the floor that can make you trip. What can I do with my stairs?  Do not leave any items on the stairs.  Make sure that there are handrails on both sides of the stairs and use them. Fix handrails that are broken or loose. Make sure that handrails are as long as the stairways.  Check any carpeting to make sure that it is firmly attached to the stairs. Fix any carpet that is loose or worn.  Avoid having throw rugs at the top or bottom of the stairs. If you do have throw rugs, attach them to the floor with carpet tape.  Make sure that you have a light switch at the top of the stairs and the bottom of the stairs. If you do not have them, ask someone to add them for you. What else can I do to help prevent falls?  Wear shoes that:  Do not have high heels.  Have rubber bottoms.  Are comfortable and fit you well.  Are closed at the toe. Do not wear sandals.  If you  use a stepladder:  Make sure that it is fully opened. Do not climb a closed stepladder.  Make sure that both sides of the stepladder are locked into place.  Ask someone to hold it for you, if possible.  Clearly mark and make sure that you can see:  Any grab bars or handrails.  First and last steps.  Where the edge of each step is.  Use tools that help you move around (mobility aids) if they are needed. These include:  Canes.  Walkers.  Scooters.  Crutches.  Turn on the lights when you go into a dark area. Replace any light bulbs as soon as they burn out.  Set up your furniture so you have a clear path. Avoid moving your furniture around.  If any of your floors are uneven, fix them.  If there are any pets around you, be aware of where they are.  Review your medicines with your doctor. Some medicines can make you feel dizzy. This can increase your chance of falling. Ask your doctor what other things that you can do to help prevent falls. This information is not intended to replace advice given to you by your health care provider. Make sure you discuss any questions you have with your health care provider. Document Released: 04/24/2009 Document Revised: 12/04/2015 Document Reviewed: 08/02/2014 Elsevier Interactive Patient Education  2017 [employer].

## 2019-11-14 NOTE — Progress Notes (Signed)
Subjective:   Craig Avery is a 79 y.o. male who presents for Medicare Annual/Subsequent preventive examination.  Review of Systems: N/A   This visit is being conducted through telemedicine via telephone at the nurse health advisor's home address due to the COVID-19 pandemic. This patient has given me verbal consent via doximity to conduct this visit, patient states they are participating from their home address. Patient and myself are on the telephone call. There is no referral for this visit. Some vital signs may be absent or patient reported.    Patient identification: identified by name, DOB, and current address   Cardiac Risk Factors include: advanced age (>32men, >47 women);hypertension;dyslipidemia;male gender     Objective:    Vitals: There were no vitals taken for this visit.  There is no height or weight on file to calculate BMI.  Advanced Directives 11/14/2019 07/14/2019 07/06/2019 11/10/2018 03/07/2017 01/04/2017 02/23/2016  Does Patient Have a Medical Advance Directive? No No No No No No No  Would patient like information on creating a medical advance directive? No - Patient declined No - Patient declined No - Patient declined No - Patient declined - - Yes - Scientist, clinical (histocompatibility and immunogenetics) given  Pre-existing out of facility DNR order (yellow form or pink MOST form) - - - - - - -    Tobacco Social History   Tobacco Use  Smoking Status Former Smoker  . Packs/day: 1.50  . Years: 33.00  . Pack years: 49.50  . Types: Cigarettes  . Quit date: 06/18/1994  . Years since quitting: 25.4  Smokeless Tobacco Never Used     Counseling given: Not Answered   Clinical Intake:  Pre-visit preparation completed: Yes  Pain : No/denies pain     Nutritional Risks: None Diabetes: No  How often do you need to have someone help you when you read instructions, pamphlets, or other written materials from your doctor or pharmacy?: 1 - Never What is the last grade level you completed in  school?: 7th  Interpreter Needed?: No  Information entered by :: CJohnson, LPN  Past Medical History:  Diagnosis Date  . Carotid stenosis 01/2013   bilateral, s/p L CEA, plan rpt [place] qyear  . History of chicken pox   . HLD (hyperlipidemia)   . HTN (hypertension)   . 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 Obesity   . Pneumonia due to COVID-19 virus 06/2019   s/p hospitalization with PNA  . Stroke [facility]) 05/2012   R PCA territory, presented with L paresthesias   Past Surgical History:  Procedure Laterality Date  . CAROTID ENDARTERECTOMY Left 07-21-12   cea  . CATARACT EXTRACTION  2004   bilateral  . COLONOSCOPY WITH PROPOFOL N/A 06/24/2015   3 polyps, one with high grade dysplasia, rpt 3 mo; [name], MD  . COLONOSCOPY WITH PROPOFOL N/A 10/14/2015   hyperplastic polyp; f/u PRN; [name], MD  . ENDARTERECTOMY  07/21/2012   Left CEA; Surgeon: [name], MD  . EYE SURGERY    . MELANOMA EXCISION  12/30/2016   right side of head above ear  . PATCH ANGIOPLASTY  07/21/2012   Procedure: PATCH ANGIOPLASTY;  Surgeon: [name], MD;  Location: [facility] OR;  Service: Vascular;  Laterality: Left;  Using 1 cm x 6 cm Vascu Guard patch   . TEE WITHOUT CARDIOVERSION  05/22/2012   Procedure: TRANSESOPHAGEAL ECHOCARDIOGRAM (TEE);  Surgeon: [name],  MD;  Location: [facility] ENDOSCOPY;  Service: Cardiovascular;  Laterality: N/A;  . WISDOM TOOTH EXTRACTION     Family History  Problem Relation Age of Onset  . CAD Brother 42       MI  . Heart disease Brother        before age 32  . Heart attack Brother   . Hypertension Mother   . Cirrhosis Father 36       EtOHic  . Aneurysm Paternal Uncle 74  . Heart disease Daughter   . Hyperlipidemia Daughter    Social History   Socioeconomic History  . Marital status: Married    Spouse name: Not on file  . Number of children: Not on file  . Years of education: Not on file    . Highest education level: Not on file  Occupational History  . Not on file  Tobacco Use  . Smoking status: Former Smoker    Packs/day: 1.50    Years: 33.00    Pack years: 49.50    Types: Cigarettes    Quit date: 06/18/1994    Years since quitting: 25.4  . Smokeless tobacco: Never Used  Substance and Sexual Activity  . Alcohol use: Yes    Alcohol/week: 4.0 - 5.0 standard drinks    Types: 4 - 5 Cans of beer per week  . Drug use: No  . Sexual activity: Yes  Other Topics Concern  . Not on file  Social History Narrative   Caffeine: light coffee in am, working on cutting back   Lives with wife, 1 dog.  Daughter next door.  Other children nearby.   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   Diet: not much water, some fruits/vegetables       Advanced directives - does not want prolonged life support if irreversible damage. Would want wife and daughters to be HCPOA. Requests info today.   Social Determinants of Health   Financial Resource Strain: Low Risk   . Difficulty of Paying Living Expenses: Not hard at all  Food Insecurity: No Food Insecurity  . Worried About Charity fundraiser in the Last Year: Never true  . Ran Out of Food in the Last Year: Never true  Transportation Needs: No Transportation Needs  . Lack of Transportation (Medical): No  . Lack of Transportation (Non-Medical): No  Physical Activity: Inactive  . Days of Exercise per Week: 0 days  . Minutes of Exercise per Session: 0 min  Stress: No Stress Concern Present  . Feeling of Stress : Not at all  Social Connections:   . Frequency of Communication with Friends and Family:   . Frequency of Social Gatherings with Friends and Family:   . Attends Religious Services:   . Active Member of Clubs or Organizations:   . Attends Archivist Meetings:   Marland Kitchen Marital Status:     Outpatient Encounter Medications as of  11/14/2019  Medication Sig  . albuterol (VENTOLIN HFA) 108 (90 Base) MCG/ACT inhaler Inhale 2 puffs into the lungs every 6 (six) hours as needed for wheezing or shortness of breath.  Marland Kitchen amLODipine (NORVASC) 10 MG tablet TAKE 1 TABLET BY MOUTH EVERY DAY (Patient taking differently: Take 10 mg by mouth daily. )  . ascorbic acid (VITAMIN C) 500 MG tablet Take 1 tablet (500 mg total) by mouth daily.  Marland Kitchen aspirin 81 MG tablet Take 81 mg by  mouth daily.  Marland Kitchen atorvastatin (LIPITOR) 20 MG tablet TAKE 1 TABLET BY MOUTH DAILY AT 6PM (Patient taking differently: Take 20 mg by mouth daily at 6 PM. TAKE 1 TABLET BY MOUTH DAILY AT 6PM)  . cloNIDine (CATAPRES) 0.1 MG tablet TAKE 1 TABLET BY MOUTH 3 TIMES DAILY.  Marland Kitchen clopidogrel (PLAVIX) 75 MG tablet TAKE 1 TABLET BY MOUTH EVERY DAY (Patient taking differently: Take 75 mg by mouth daily. )  . Cyanocobalamin (VITAMIN B 12 PO) Take by mouth daily.  . feeding supplement, ENSURE ENLIVE, (ENSURE ENLIVE) LIQD Take 237 mLs by mouth 3 (three) times daily.  Marland Kitchen guaiFENesin (MUCINEX) 600 MG 12 hr tablet Take 2 tablets (1,200 mg total) by mouth 2 (two) times daily.  Marland Kitchen ipratropium-albuterol (DUONEB) 0.5-2.5 (3) MG/3ML SOLN Take 3 mLs by nebulization every 6 (six) hours as needed (dyspnea, wheezing, cough).  Marland Kitchen lisinopril (ZESTRIL) 2.5 MG tablet Take 1 tablet (2.5 mg total) by mouth daily.  . Multiple Vitamins-Minerals (CENTRUM SILVER ADULT 50+ PO) Take 1 tablet by mouth daily.  . prednisoLONE acetate (PRED FORTE) 1 % ophthalmic suspension Place 1 drop into both eyes 4 (four) times daily.  . prochlorperazine (COMPAZINE) 10 MG tablet Take 1 tablet (10 mg total) by mouth daily as needed (sleep, to be given with benadryl).  . zinc sulfate 220 (50 Zn) MG capsule Take 1 capsule (220 mg total) by mouth daily.   No facility-administered encounter medications on file as of 11/14/2019.    Activities of Daily Living In your present state of health, do you have any difficulty performing the  following activities: 11/14/2019 07/13/2019  Hearing? N N  Vision? N N  Difficulty concentrating or making decisions? N N  Walking or climbing stairs? N N  Dressing or bathing? N Y  Doing errands, shopping? N N  Preparing Food and eating ? N -  Using the Toilet? N -  In the past six months, have you accidently leaked urine? N -  Do you have problems with loss of bowel control? N -  Managing your Medications? N -  Managing your Finances? N -  Housekeeping or managing your Housekeeping? N -  Some recent data might be hidden    Patient Care Team: [name], MD as PCP - General (Family Medicine) Minna Merritts, MD as PCP - Cardiology (Cardiology)   Assessment:   This is a routine wellness examination for Bard.  Exercise Activities and Dietary recommendations Current Exercise Habits: The patient does not participate in regular exercise at present, Exercise limited by: None identified  Goals    . Patient Stated     Starting 11/10/18, I will continue to take medications as prescribed.     . Patient Stated     11/14/2019, I will maintain and continue medications as prescribed.       Fall Risk Fall Risk  11/14/2019 11/10/2018 01/04/2017 06/17/2016 05/23/2015  Falls in the past year? 0 0 No No No  Comment - - - Emmi Telephone Survey: data to providers prior to load -  Number falls in past yr: 0 - - - -  Injury with Fall? 0 - - - -  Risk for fall due to : Medication side effect - - - -  Follow up Falls evaluation completed;Falls prevention discussed - - - -   Is the patient's home free of loose throw rugs in walkways, pet beds, electrical cords, etc?   yes      Grab bars in the bathroom?  yes      Handrails on the stairs?   yes      Adequate lighting?   yes  Timed Get Up and Go Performed: N/A  Depression Screen PHQ 2/9 Scores 11/14/2019 11/10/2018 01/04/2017 05/23/2015  PHQ - 2 Score 0 0 0 0  PHQ- 9 Score 0 0 - -    Cognitive Function MMSE - Mini Mental State Exam 11/14/2019  11/10/2018 01/04/2017  Orientation to time 5 5 5   Orientation to Place 5 5 5   Registration 3 3 3   Attention/ Calculation 5 0 0  Recall 3 3 3   Language- name 2 objects - 0 0  Language- repeat 1 1 1   Language- follow 3 step command - 0 3  Language- read & follow direction - 0 0  Write a sentence - 0 0  Copy design - 0 0  Total score - 17 20  Mini Cog  Mini-Cog screen was completed. Maximum score is 22. A value of 0 denotes this part of the MMSE was not completed or the patient failed this part of the Mini-Cog screening.       Immunization History  Administered Date(s) Administered  . Fluad Quad(high Dose 65+) 05/17/2019  . Influenza-Unspecified 03/12/2014  . PFIZER SARS-COV-2 Vaccination 10/25/2019  . Tdap 11/16/2014    Qualifies for Shingles Vaccine: Yes  Screening Tests Health Maintenance  Topic Date Due  . DTAP VACCINES (1) 06/12/1941  . COVID-19 Vaccine (2 - Pfizer 2-dose series) 11/15/2019  . PNA vac Low Risk Adult (1 of 2 - PCV13) 01/04/2049 (Originally 04/11/2004)  . INFLUENZA VACCINE  02/10/2020  . DTaP/Tdap/Td (2 - Td) 11/15/2024  . TETANUS/TDAP  11/15/2024   Cancer Screenings: Lung: Low Dose CT Chest recommended if Age 51-80 years, 30 pack-year currently smoking OR have quit w/in 15 years. Patient does not qualify. Colorectal: completed 10/14/2015  Additional Screenings:  Hepatitis C Screening: N/A      Plan:    Patient will maintain and continue medications as prescribed.   I have personally reviewed and noted the following in the patient's chart:   . Medical and social history . Use of alcohol, tobacco or illicit drugs  . Current medications and supplements . Functional ability and status . Nutritional status . Physical activity . Advanced directives . List of other physicians . Hospitalizations, surgeries, and ER visits in previous 12 months . Vitals . Screenings to include cognitive, depression, and falls . Referrals and appointments  In  addition, I have reviewed and discussed with patient certain preventive protocols, quality metrics, and best practice recommendations. A written personalized care plan for preventive services as well as general preventive health recommendations were provided to patient.     Andrez Grime, LPN  [id number]

## 2019-11-14 NOTE — Progress Notes (Signed)
PCP notes:  Health Maintenance: Pneumonia vaccines- declined   Abnormal Screenings: none   Patient concerns: none   Nurse concerns: none   Next PCP appt.: 11/19/2019 @ 11:30 am

## 2019-11-15 ENCOUNTER — Other Ambulatory Visit: Payer: Self-pay | Admitting: Family Medicine

## 2019-11-15 DIAGNOSIS — D72820 Lymphocytosis (symptomatic): Secondary | ICD-10-CM

## 2019-11-16 LAB — PATHOLOGIST SMEAR REVIEW

## 2019-11-19 ENCOUNTER — Ambulatory Visit (INDEPENDENT_AMBULATORY_CARE_PROVIDER_SITE_OTHER)
Admission: RE | Admit: 2019-11-19 | Discharge: 2019-11-19 | Disposition: A | Discharge status: Home / Self Care | Payer: Medicare Other | Source: Ambulatory Visit | Attending: Family Medicine | Admitting: Family Medicine

## 2019-11-19 ENCOUNTER — Ambulatory Visit (INDEPENDENT_AMBULATORY_CARE_PROVIDER_SITE_OTHER): Payer: Medicare Other | Admitting: Family Medicine

## 2019-11-19 ENCOUNTER — Encounter: Payer: Self-pay | Admitting: Family Medicine

## 2019-11-19 ENCOUNTER — Other Ambulatory Visit: Payer: Self-pay

## 2019-11-19 VITALS — BP 134/70 | HR 78 | Temp 97.9°F | Ht 62.75 in | Wt 186.1 lb

## 2019-11-19 DIAGNOSIS — D649 Anemia, unspecified: Secondary | ICD-10-CM

## 2019-11-19 DIAGNOSIS — N1831 Chronic kidney disease, stage 3a: Secondary | ICD-10-CM | POA: Diagnosis not present

## 2019-11-19 DIAGNOSIS — D72820 Lymphocytosis (symptomatic): Secondary | ICD-10-CM | POA: Diagnosis not present

## 2019-11-19 DIAGNOSIS — G933 Postviral fatigue syndrome: Secondary | ICD-10-CM

## 2019-11-19 DIAGNOSIS — U071 COVID-19: Secondary | ICD-10-CM

## 2019-11-19 DIAGNOSIS — L821 Other seborrheic keratosis: Secondary | ICD-10-CM | POA: Diagnosis not present

## 2019-11-19 DIAGNOSIS — Z87891 Personal history of nicotine dependence: Secondary | ICD-10-CM | POA: Diagnosis not present

## 2019-11-19 DIAGNOSIS — J1282 Pneumonia due to coronavirus disease 2019: Secondary | ICD-10-CM | POA: Diagnosis not present

## 2019-11-19 DIAGNOSIS — E785 Hyperlipidemia, unspecified: Secondary | ICD-10-CM

## 2019-11-19 DIAGNOSIS — I739 Peripheral vascular disease, unspecified: Secondary | ICD-10-CM

## 2019-11-19 DIAGNOSIS — L570 Actinic keratosis: Secondary | ICD-10-CM | POA: Diagnosis not present

## 2019-11-19 DIAGNOSIS — Z85828 Personal history of other malignant neoplasm of skin: Secondary | ICD-10-CM | POA: Diagnosis not present

## 2019-11-19 DIAGNOSIS — D485 Neoplasm of uncertain behavior of skin: Secondary | ICD-10-CM | POA: Diagnosis not present

## 2019-11-19 DIAGNOSIS — I10 Essential (primary) hypertension: Secondary | ICD-10-CM | POA: Diagnosis not present

## 2019-11-19 DIAGNOSIS — Z8673 Personal history of transient ischemic attack (TIA), and cerebral infarction without residual deficits: Secondary | ICD-10-CM

## 2019-11-19 DIAGNOSIS — Z7189 Other specified counseling: Secondary | ICD-10-CM

## 2019-11-19 DIAGNOSIS — G9331 Postviral fatigue syndrome: Secondary | ICD-10-CM

## 2019-11-19 DIAGNOSIS — Z08 Encounter for follow-up examination after completed treatment for malignant neoplasm: Secondary | ICD-10-CM | POA: Diagnosis not present

## 2019-11-19 LAB — CBC WITH DIFFERENTIAL/PLATELET
Basophils Absolute: 0 10*3/uL (ref 0.0–0.1)
Basophils Relative: 0.5 % (ref 0.0–3.0)
Eosinophils Absolute: 0.2 10*3/uL (ref 0.0–0.7)
Eosinophils Relative: 2.2 % (ref 0.0–5.0)
HCT: 38.5 % — ABNORMAL LOW (ref 39.0–52.0)
Hemoglobin: 13.1 g/dL (ref 13.0–17.0)
Lymphocytes Relative: 39.5 % (ref 12.0–46.0)
Lymphs Abs: 3.1 10*3/uL (ref 0.7–4.0)
MCHC: 33.9 g/dL (ref 30.0–36.0)
MCV: 87.4 fl (ref 78.0–100.0)
Monocytes Absolute: 0.5 10*3/uL (ref 0.1–1.0)
Monocytes Relative: 5.8 % (ref 3.0–12.0)
Neutro Abs: 4 10*3/uL (ref 1.4–7.7)
Neutrophils Relative %: 52 % (ref 43.0–77.0)
Platelets: 242 10*3/uL (ref 150.0–400.0)
RBC: 4.41 Mil/uL (ref 4.22–5.81)
RDW: 15.5 % (ref 11.5–15.5)
WBC: 7.7 10*3/uL (ref 4.0–10.5)

## 2019-11-19 NOTE — Assessment & Plan Note (Signed)
Overall largely improved post-covid.

## 2019-11-19 NOTE — Assessment & Plan Note (Signed)
Continues plavix and aspirin. Will increase statin dose.

## 2019-11-19 NOTE — Assessment & Plan Note (Signed)
Deterioration noted. Check SPEP today.

## 2019-11-19 NOTE — Assessment & Plan Note (Signed)
Will increase atorvastatin to 40mg  daily- he will let me know when running low on his 20mg  dose, will take 2 tab daily.  The ASCVD Risk score Craig Bussing DC Jr., et al., 2013) failed to calculate for the following reasons:   The 2013 ASCVD risk score is only valid for ages 58 to 22

## 2019-11-19 NOTE — Assessment & Plan Note (Signed)
Anemia has normalized. Reviewed iron rich diet options.

## 2019-11-19 NOTE — Assessment & Plan Note (Signed)
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 Patient overall asymptomatic. Will update CBC, and add SPEP and flow cytometry to further evaluate this. Discussed possible differential and possible heme referral pending results.

## 2019-11-19 NOTE — Assessment & Plan Note (Signed)
Advanced directives - doesn't have living will. Does not want prolonged life support if terminal condition - doesn't want to be "burden". Would want daughters to be HCPOA. Packet provided today.

## 2019-11-19 NOTE — Assessment & Plan Note (Addendum)
Largely resolved from this. No longer on supplemental oxygen, no more fatigue or dyspnea.

## 2019-11-19 NOTE — Assessment & Plan Note (Signed)
Saw PAD specialist 08/2019, note reviewed. Not on pletal

## 2019-11-19 NOTE — Assessment & Plan Note (Signed)
Chronic, stable. Continue current regimen. 

## 2019-11-19 NOTE — Progress Notes (Signed)
This visit was conducted in person.  BP 134/70 (BP Location: Left Arm, Patient Position: Sitting, Cuff Size: Normal)   Pulse 78   Temp 97.9 F (36.6 C) (Temporal)   Ht 5' 2.75" (1.594 m)   Wt 186 lb 1 oz (84.4 kg)   SpO2 96%   BMI 33.22 kg/m    CC: AMW f/u visit  Subjective:    Patient ID: Craig Avery, male    DOB: Jul 17, 1940, 79 y.o.   MRN: [id number]  HPI: Craig Avery is a 79 y.o. male presenting on 11/19/2019 for Annual Exam (Prt 2. )   Saw health advisor last week for medicare wellness visit. Note reviewed.   No exam data present    Clinical Support from 11/14/2019 in [place] at [facility] Total Score  0      Fall Risk  11/14/2019 11/10/2018 01/04/2017 06/17/2016 05/23/2015  Falls in the past year? 0 0 No No No  Comment - - - Emmi Telephone Survey: data to providers prior to load -  Number falls in past yr: 0 - - - -  Injury with Fall? 0 - - - -  Risk for fall due to : Medication side effect - - - -  Follow up Falls evaluation completed;Falls prevention discussed - - - -      Recent prolonged hospitalization for [id number] PNA complicated by HCAP treated with cefepime IV (07/12/2019-07/24/2019). Did have some post-covid fatigue. No longer using oxygen. Still occasionally notes dry cough and soreness to mid posterior chest with hard cough/sneeze.   COPD - no longer using respiratory medication. Ex smoker. No previous h/o COPD.   HTN - stable period on amlodipine, lisinopril, clonidine.    Absolute lymphocytosis noted today, as well as worsening kidney function. Denies fevers/chills, unexpected weight loss. Some longstanding night sweats. Post covid fatigue has improved  Preventative: COLONOSCOPY WITH PROPOFOL 06/24/2015 3 polyps, one with high grade dysplasia, rpt 3 mo; [name], MD COLONOSCOPY WITH PROPOFOL 10/14/2015 hyperplastic polyp; f/u PRN; Wallie Char - aged out.  Prostate cancer screening -aged out  Needle phobia  Flu shot -  declines  Pneumovax -discussed,declines  Tdap 11/2014  zostavax - declines  COVID vaccine - completed Pfizer vaccines 11/2019! Advanced directives - doesn't have living will. Does not want prolonged life support if terminal condition - doesn't want to be "burden". Would want daughters to be HCPOA. Packet provided today.  Seat belt use discussed.  Sunscreen use discussed. Recent BCC removed R scalp at [facility]. Has appt with derm this afternoon.  Ex smoker - quit 1995  Alcohol - 3-4 drinks/wk  Dentist - full dentures but doesn't use - no appetite since Christmas 2020 - has stopped ensure.  Eye exam yearly - postponed  Bowels - no constipation  Bladder - no incontinence   Caffeine: light coffee in am, working on cutting back Lives alone. Widower (08/2017). Daughter next door.Other children nearby. 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 Interim medical history since our last visit reviewed. Allergies and medications reviewed and updated. Outpatient Medications Prior to Visit  Medication Sig Dispense Refill  . amLODipine (NORVASC) 10 MG tablet TAKE 1 TABLET BY MOUTH EVERY DAY (Patient taking differently: Take 10 mg by mouth daily. ) 90 tablet 1  .  aspirin 81 MG tablet Take 81 mg by mouth daily.    Marland Kitchen atorvastatin (LIPITOR) 40 MG tablet Take 1 tablet (40 mg total) by mouth daily.    . clopidogrel (PLAVIX) 75 MG tablet TAKE 1 TABLET BY MOUTH EVERY DAY (Patient taking differently: Take 75 mg by mouth daily. ) 90 tablet 3  . Docusate Calcium (STOOL SOFTENER PO) Take by mouth.    Marland Kitchen lisinopril (ZESTRIL) 2.5 MG tablet Take 1 tablet (2.5 mg total) by mouth daily. 30 tablet 6  . loratadine (CLARITIN) 10 MG tablet Take 10 mg by mouth daily.    . Multiple  Vitamins-Minerals (CENTRUM SILVER ADULT 50+ PO) Take 1 tablet by mouth daily.    Marland Kitchen atorvastatin (LIPITOR) 20 MG tablet TAKE 1 TABLET BY MOUTH DAILY AT 6PM (Patient taking differently: Take 20 mg by mouth daily at 6 PM. TAKE 1 TABLET BY MOUTH DAILY AT 6PM) 90 tablet 2  . cloNIDine (CATAPRES) 0.1 MG tablet TAKE 1 TABLET BY MOUTH 3 TIMES DAILY. (Patient not taking: Reported on 11/19/2019) 270 tablet 0  . prochlorperazine (COMPAZINE) 10 MG tablet Take 1 tablet (10 mg total) by mouth daily as needed (sleep, to be given with benadryl). (Patient not taking: Reported on 11/19/2019) 30 tablet 0  . albuterol (VENTOLIN HFA) 108 (90 Base) MCG/ACT inhaler Inhale 2 puffs into the lungs every 6 (six) hours as needed for wheezing or shortness of breath. 18 g 3  . ascorbic acid (VITAMIN C) 500 MG tablet Take 1 tablet (500 mg total) by mouth daily.    . Cyanocobalamin (VITAMIN B 12 PO) Take by mouth daily.    . feeding supplement, ENSURE ENLIVE, (ENSURE ENLIVE) LIQD Take 237 mLs by mouth 3 (three) times daily. 237 mL 12  . guaiFENesin (MUCINEX) 600 MG 12 hr tablet Take 2 tablets (1,200 mg total) by mouth 2 (two) times daily.    Marland Kitchen ipratropium-albuterol (DUONEB) 0.5-2.5 (3) MG/3ML SOLN Take 3 mLs by nebulization every 6 (six) hours as needed (dyspnea, wheezing, cough). 360 mL 0  . prednisoLONE acetate (PRED FORTE) 1 % ophthalmic suspension Place 1 drop into both eyes 4 (four) times daily.    Marland Kitchen zinc sulfate 220 (50 Zn) MG capsule Take 1 capsule (220 mg total) by mouth daily.     No facility-administered medications prior to visit.     Per HPI unless specifically indicated in ROS section below Review of Systems Objective:  BP 134/70 (BP Location: Left Arm, Patient Position: Sitting, Cuff Size: Normal)   Pulse 78   Temp 97.9 F (36.6 C) (Temporal)   Ht 5' 2.75" (1.594 m)   Wt 186 lb 1 oz (84.4 kg)   SpO2 96%   BMI 33.22 kg/m   Wt Readings from Last 3 Encounters:  11/19/19 186 lb 1 oz (84.4 kg)  09/03/19 186 lb  (84.4 kg)  08/22/19 186 lb 1 oz (84.4 kg)      Physical Exam Vitals and nursing note reviewed.  Constitutional:      General: He is not in acute distress.    Appearance: Normal appearance. He is well-developed. He is obese. He is not ill-appearing.  HENT:     Head: Normocephalic and atraumatic.     Right Ear: Hearing, tympanic membrane, ear canal and external ear normal.     Left Ear: Hearing, tympanic membrane, ear canal and external ear normal.  Eyes:     General: No scleral icterus.    Extraocular Movements: Extraocular movements intact.  Conjunctiva/sclera: Conjunctivae normal.     Pupils: Pupils are equal, round, and reactive to light.  Cardiovascular:     Rate and Rhythm: Normal rate and regular rhythm.     Pulses: Normal pulses.          Radial pulses are 2+ on the right side and 2+ on the left side.     Heart sounds: Normal heart sounds. No murmur.  Pulmonary:     Effort: Pulmonary effort is normal. No respiratory distress.     Breath sounds: Normal breath sounds. No wheezing, rhonchi or rales.  Abdominal:     General: Bowel sounds are normal. There is no distension.     Palpations: Abdomen is soft. There is no hepatomegaly, splenomegaly or mass.     Tenderness: There is no abdominal tenderness. There is no right CVA tenderness, left CVA tenderness, guarding or rebound.     Hernia: No hernia is present.  Musculoskeletal:        General: Normal range of motion.     Cervical back: Normal range of motion and neck supple.     Right lower leg: No edema.     Left lower leg: No edema.  Lymphadenopathy:     Cervical: No cervical adenopathy.  Skin:    General: Skin is warm and dry.     Findings: No rash.  Neurological:     General: No focal deficit present.     Mental Status: He is alert and oriented to person, place, and time.     Comments: CN grossly intact, station and gait intact  Psychiatric:        Mood and Affect: Mood normal.        Behavior: Behavior normal.         Thought Content: Thought content normal.        Judgment: Judgment normal.       Results for orders placed or performed in visit on 11/15/19  Pathologist smear review  Result Value Ref Range   Path Review     Assessment & Plan:  This visit occurred during the SARS-CoV-2 public health emergency.  Safety protocols were in place, including screening questions prior to the visit, additional usage of staff PPE, and extensive cleaning of exam room while observing appropriate contact time as indicated for disinfecting solutions.   Problem List Items Addressed This Visit    Smoking history    Ex smoker, quit remotely.       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 Not on pletal      Relevant Medications   atorvastatin (LIPITOR) 40 MG tablet   Pneumonia due to COVID-19 virus    Largely resolved from this. No longer on supplemental oxygen, no more fatigue or dyspnea.       Relevant Medications   loratadine (CLARITIN) 10 MG tablet   Other Relevant Orders   DG Chest 2 View   Lymphocytosis    New absolute lymphocytosis confirmed on peripheral smear. Patient overall asymptomatic. Will update CBC, and add SPEP and flow cytometry to further evaluate this. Discussed possible differential and possible heme referral pending results.       Relevant Orders   LEUKEMIA/LYMPHOMA EVALUATION PANEL   Serum protein electrophoresis with reflex   CBC with Differential/Platelet   Hyperlipidemia LDL goal <70    Will increase atorvastatin to 40mg  daily- he will let me know when running low on his 20mg  dose, will take  2 tab daily.  The ASCVD Risk score Mikey Bussing DC Jr., et al., 2013) failed to calculate for the following reasons:   The 2013 ASCVD risk score is only valid for ages 74 to 6       Relevant Medications   atorvastatin (LIPITOR) 40 MG tablet   HTN (hypertension) (Chronic)    Chronic, stable. Continue current regimen.         Relevant Medications   atorvastatin (LIPITOR) 40 MG tablet   History of stroke    Continues plavix and aspirin. Will increase statin dose.       Fatigue    Overall largely improved post-covid.       CKD (chronic kidney disease) stage 3, GFR 30-59 ml/min    Deterioration noted. Check SPEP today.       Relevant Orders   Serum protein electrophoresis with reflex   Anemia    Anemia has normalized. Reviewed iron rich diet options.       Advanced care planning/counseling discussion - Primary    Advanced directives - doesn't have living will. Does not want prolonged life support if terminal condition - doesn't want to be "burden". Would want daughters to be HCPOA. Packet provided today.           No orders of the defined types were placed in this encounter.  Orders Placed This Encounter  Procedures  . DG Chest 2 View    Standing Status:   Future    Number of Occurrences:   1    Standing Expiration Date:   01/18/2021    Order Specific Question:   Reason for Exam (SYMPTOM  OR DIAGNOSIS REQUIRED)    Answer:   f/u COVID PNA    Order Specific Question:   Preferred imaging location?    Answer:   [facility]    Order Specific Question:   Radiology Contrast Protocol - do NOT remove file path    Answer:   [email or web address]  . LEUKEMIA/LYMPHOMA EVALUATION PANEL  . Serum protein electrophoresis with reflex  . CBC with Differential/Platelet    Patient instructions: Follow up chest xray today.  Repeat labs today.  Call to set up follow up with Dr Rockey Situ for 01/2020.  Increase lipitor (atorvastatin) to 40mg  daily. May take 2 until you run out, then new dose will be 40mg  daily. Let us know when you run out of 20mg  dose.  You are doing well today  Return in 6 months for follow up visit.   Follow up plan: Return in about 6 months (around 05/21/2020) for follow up visit.  [name], MD

## 2019-11-19 NOTE — Assessment & Plan Note (Signed)
Ex smoker, quit remotely.

## 2019-11-19 NOTE — Patient Instructions (Addendum)
Follow up chest xray today.  Repeat labs today.  Call to set up follow up with Dr Rockey Situ for 01/2020.  Increase lipitor (atorvastatin) to 40mg  daily. May take 2 until you run out, then new dose will be 40mg  daily. Let us know when you run out of 20mg  dose.  You are doing well today  Return in 6 months for follow up visit.   Health Maintenance After Age 81 After age 13, you are at a higher risk for certain long-term diseases and infections as well as injuries from falls. Falls are a major cause of broken bones and head injuries in people who are older than age 9. Getting regular preventive care can help to keep you healthy and well. Preventive care includes getting regular testing and making lifestyle changes as recommended by your health care provider. Talk with your health care provider about:  Which screenings and tests you should have. A screening is a test that checks for a disease when you have no symptoms.  A diet and exercise plan that is right for you. What should I know about screenings and tests to prevent falls? Screening and testing are the best ways to find a health problem early. Early diagnosis and treatment give you the best chance of managing medical conditions that are common after age 75. Certain conditions and lifestyle choices may make you more likely to have a fall. Your health care provider may recommend:  Regular vision checks. Poor vision and conditions such as cataracts can make you more likely to have a fall. If you wear glasses, make sure to get your prescription updated if your vision changes.  Medicine review. Work with your health care provider to regularly review all of the medicines you are taking, including over-the-counter medicines. Ask your health care provider about any side effects that may make you more likely to have a fall. Tell your health care provider if any medicines that you take make you feel dizzy or sleepy.  Osteoporosis screening. Osteoporosis is  a condition that causes the bones to get weaker. This can make the bones weak and cause them to break more easily.  Blood pressure screening. Blood pressure changes and medicines to control blood pressure can make you feel dizzy.  Strength and balance checks. Your health care provider may recommend certain tests to check your strength and balance while standing, walking, or changing positions.  Foot health exam. Foot pain and numbness, as well as not wearing proper footwear, can make you more likely to have a fall.  Depression screening. You may be more likely to have a fall if you have a fear of falling, feel emotionally low, or feel unable to do activities that you used to do.  Alcohol use screening. Using too much alcohol can affect your balance and may make you more likely to have a fall. What actions can I take to lower my risk of falls? General instructions  Talk with your health care provider about your risks for falling. Tell your health care provider if: ? You fall. Be sure to tell your health care provider about all falls, even ones that seem minor. ? You feel dizzy, sleepy, or off-balance.  Take over-the-counter and prescription medicines only as told by your health care provider. These include any supplements.  Eat a healthy diet and maintain a healthy weight. A healthy diet includes low-fat dairy products, low-fat (lean) meats, and fiber from whole grains, beans, and lots of fruits and vegetables. Home safety  Remove any tripping hazards, such as rugs, cords, and clutter.  Install safety equipment such as grab bars in bathrooms and safety rails on stairs.  Keep rooms and walkways well-lit. Activity   Follow a regular exercise program to stay fit. This will help you maintain your balance. Ask your health care provider what types of exercise are appropriate for you.  If you need a cane or walker, use it as recommended by your health care provider.  Wear supportive shoes  that have nonskid soles. Lifestyle  Do not drink alcohol if your health care provider tells you not to drink.  If you drink alcohol, limit how much you have: ? 0-1 drink a day for women. ? 0-2 drinks a day for men.  Be aware of how much alcohol is in your drink. In the [place] one drink equals one typical bottle of beer (12 oz), one-half glass of wine (5 oz), or one shot of hard liquor (1 oz).  Do not use any products that contain nicotine or tobacco, such as cigarettes and e-cigarettes. If you need help quitting, ask your health care provider. Summary  Having a healthy lifestyle and getting preventive care can help to protect your health and wellness after age 67.  Screening and testing are the best way to find a health problem early and help you avoid having a fall. Early diagnosis and treatment give you the best chance for managing medical conditions that are more common for people who are older than age 61.  Falls are a major cause of broken bones and head injuries in people who are older than age 68. Take precautions to prevent a fall at home.  Work with your health care provider to learn what changes you can make to improve your health and wellness and to prevent falls. This information is not intended to replace advice given to you by your health care provider. Make sure you discuss any questions you have with your health care provider. Document Revised: 10/19/2018 Document Reviewed: 05/11/2017 Elsevier Patient Education  2020 [employer].

## 2019-11-20 ENCOUNTER — Encounter: Payer: Self-pay | Admitting: Family Medicine

## 2019-11-23 ENCOUNTER — Encounter: Payer: Self-pay | Admitting: Family Medicine

## 2019-11-23 DIAGNOSIS — D472 Monoclonal gammopathy: Secondary | ICD-10-CM | POA: Insufficient documentation

## 2019-11-26 LAB — PROTEIN ELECTROPHORESIS, SERUM, WITH REFLEX
Albumin ELP: 3.8 g/dL (ref 3.8–4.8)
Alpha 1: 0.3 g/dL (ref 0.2–0.3)
Alpha 2: 0.8 g/dL (ref 0.5–0.9)
Beta 2: 0.3 g/dL (ref 0.2–0.5)
Beta Globulin: 0.4 g/dL (ref 0.4–0.6)
Gamma Globulin: 0.9 g/dL (ref 0.8–1.7)
Total Protein: 6.5 g/dL (ref 6.1–8.1)

## 2019-11-26 LAB — LEUKEMIA/LYMPHOMA EVALUATION PANEL
NUMBER OF MARKERS:: 24
VIABILITY:: 75 %

## 2019-11-26 LAB — IFE INTERPRETATION

## 2019-12-24 DIAGNOSIS — D0422 Carcinoma in situ of skin of left ear and external auricular canal: Secondary | ICD-10-CM | POA: Diagnosis not present

## 2020-01-17 ENCOUNTER — Other Ambulatory Visit: Payer: Self-pay | Admitting: Family Medicine

## 2020-01-19 ENCOUNTER — Other Ambulatory Visit: Payer: Self-pay | Admitting: Family Medicine

## 2020-01-20 ENCOUNTER — Other Ambulatory Visit: Payer: Self-pay | Admitting: Cardiovascular Disease

## 2020-01-25 ENCOUNTER — Encounter: Payer: Self-pay | Admitting: Family Medicine

## 2020-01-25 MED ORDER — ATORVASTATIN CALCIUM 40 MG PO TABS: 40.0000 mg | ORAL_TABLET | Freq: Every day | ORAL | 3 refills | Status: DC

## 2020-01-25 NOTE — Telephone Encounter (Addendum)
E-scribed new rx, atorvastatin 40 mg daily. (see OV, 5/0/21)

## 2020-02-23 ENCOUNTER — Emergency Department
Admission: EM | Admit: 2020-02-23 | Discharge: 2020-02-23 | Disposition: A | Discharge status: Home / Self Care | Payer: Medicare Other | Attending: Emergency Medicine | Admitting: Emergency Medicine

## 2020-02-23 ENCOUNTER — Other Ambulatory Visit: Payer: Self-pay

## 2020-02-23 ENCOUNTER — Emergency Department: Payer: Medicare Other

## 2020-02-23 DIAGNOSIS — Z79899 Other long term (current) drug therapy: Secondary | ICD-10-CM | POA: Diagnosis not present

## 2020-02-23 DIAGNOSIS — Y9289 Other specified places as the place of occurrence of the external cause: Secondary | ICD-10-CM | POA: Diagnosis not present

## 2020-02-23 DIAGNOSIS — I129 Hypertensive chronic kidney disease with stage 1 through stage 4 chronic kidney disease, or unspecified chronic kidney disease: Secondary | ICD-10-CM | POA: Diagnosis not present

## 2020-02-23 DIAGNOSIS — F17210 Nicotine dependence, cigarettes, uncomplicated: Secondary | ICD-10-CM | POA: Diagnosis not present

## 2020-02-23 DIAGNOSIS — Y998 Other external cause status: Secondary | ICD-10-CM | POA: Diagnosis not present

## 2020-02-23 DIAGNOSIS — S61301A Unspecified open wound of left index finger with damage to nail, initial encounter: Secondary | ICD-10-CM | POA: Diagnosis not present

## 2020-02-23 DIAGNOSIS — N1831 Chronic kidney disease, stage 3a: Secondary | ICD-10-CM | POA: Insufficient documentation

## 2020-02-23 DIAGNOSIS — S61209A Unspecified open wound of unspecified finger without damage to nail, initial encounter: Secondary | ICD-10-CM

## 2020-02-23 DIAGNOSIS — W312XXA Contact with powered woodworking and forming machines, initial encounter: Secondary | ICD-10-CM | POA: Insufficient documentation

## 2020-02-23 DIAGNOSIS — Y9389 Activity, other specified: Secondary | ICD-10-CM | POA: Diagnosis not present

## 2020-02-23 DIAGNOSIS — S68111A Complete traumatic metacarpophalangeal amputation of left index finger, initial encounter: Secondary | ICD-10-CM | POA: Diagnosis not present

## 2020-02-23 MED ORDER — CEPHALEXIN 500 MG PO CAPS
500.0000 mg | ORAL_CAPSULE | Freq: Once | ORAL | Status: AC
  Administered 2020-02-23: 500 mg via ORAL
  Filled 2020-02-23: qty 1

## 2020-02-23 MED ORDER — CEPHALEXIN 500 MG PO CAPS
500.0000 mg | ORAL_CAPSULE | Freq: Three times a day (TID) | ORAL | 0 refills | Status: DC
Start: 2020-02-23 — End: 2020-09-04

## 2020-02-23 MED ORDER — LIDOCAINE HCL (PF) 1 % IJ SOLN
5.0000 mL | Freq: Once | INTRAMUSCULAR | Status: AC
  Administered 2020-02-23: 5 mL via INTRADERMAL
  Filled 2020-02-23: qty 5

## 2020-02-23 NOTE — ED Provider Notes (Signed)
[facility] Emergency Department Provider Note  ____________________________________________   First MD Initiated Contact with Patient 02/23/20 2111     (approximate)  I have reviewed the triage vital signs and the nursing notes.   HISTORY  Chief Complaint Laceration    HPI Craig Avery is a 79 y.o. male presents emergency department with a laceration to the left index finger.  Patient was using a table saw earlier and it cut part of his finger off.  States it continues to bleed and is afraid he might of cut an artery.  Unsure of his last tetanus but his chart does show that his tetanus was updated in 2016.    Past Medical History:  Diagnosis Date  . Carotid stenosis 01/2013   bilateral, s/p L CEA, plan rpt [place] qyear  . HCAP (healthcare-associated pneumonia) 07/21/2019  . History of chicken pox   . HLD (hyperlipidemia)   . HTN (hypertension)   . 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 Obesity   . Pneumonia due to COVID-19 virus 06/2019   s/p hospitalization with PNA  . Stroke [facility]) 05/2012   R PCA territory, presented with L paresthesias    Patient Active Problem List   Diagnosis Date Noted  . 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 Lymphocytosis 11/19/2019  . Anemia 11/14/2019  . Pneumonia due to COVID-19 virus 07/21/2019  . Depression 07/21/2019  . PVD (peripheral vascular disease) with claudication ([place]) 11/16/2017  . Smoking history 04/27/2017  . Right shoulder pain 11/24/2015  . Personal history of colonic polyps   . Vasovagal syncope 11/20/2014  . Constipation 11/20/2014  . Medicare annual wellness visit, subsequent 03/25/2014  . Advanced care planning/counseling discussion 03/25/2014  . CKD (chronic kidney disease) stage 3, GFR 30-59 ml/min 03/25/2014  . Fatigue 12/13/2012  . Disturbance of skin sensation 08/10/2012  . Obesity, Class I,  BMI 30-34.9   . TIA (transient ischemic attack) 05/21/2012  . Hyperlipidemia LDL goal <70 05/21/2012  . HTN (hypertension) 05/18/2012  . History of stroke 05/18/2012  . Carotid stenosis, bilateral 05/18/2012  . Left homonymous hemianopsia 05/12/2012    Past Surgical History:  Procedure Laterality Date  . CAROTID ENDARTERECTOMY Left 07-21-12   cea  . CATARACT EXTRACTION  2004   bilateral  . COLONOSCOPY WITH PROPOFOL N/A 06/24/2015   3 polyps, one with high grade dysplasia, rpt 3 mo; [name], MD  . COLONOSCOPY WITH PROPOFOL N/A 10/14/2015   hyperplastic polyp; f/u PRN; [name], MD  . ENDARTERECTOMY  07/21/2012   Left CEA; Surgeon: [name], MD  . EYE SURGERY    . MELANOMA EXCISION  12/30/2016   right side of head above ear  . PATCH ANGIOPLASTY  07/21/2012   Procedure: PATCH ANGIOPLASTY;  Surgeon: [name], MD;  Location: [facility] OR;  Service: Vascular;  Laterality: Left;  Using 1 cm x 6 cm Vascu Guard patch   . TEE WITHOUT CARDIOVERSION  05/22/2012   Procedure: TRANSESOPHAGEAL ECHOCARDIOGRAM (TEE);  Surgeon: [name], MD;  Location: [facility] ENDOSCOPY;  Service: Cardiovascular;  Laterality: N/A;  . WISDOM TOOTH EXTRACTION      Prior to Admission medications   Medication Sig Start Date End Date Taking? Authorizing Provider  amLODipine (NORVASC) 10 MG tablet TAKE 1 TABLET BY MOUTH EVERY DAY Patient taking differently: Take 10 mg by mouth daily.  07/03/19   [name], MD  aspirin 81 MG tablet Take 81 mg by mouth daily.    [provider]  atorvastatin (LIPITOR) 40 MG tablet Take 1 tablet (40 mg total) by mouth daily. 01/25/20   [name], MD  cephALEXin (KEFLEX) 500 MG capsule Take 1 capsule (500 mg total) by mouth 3 (three) times daily. 02/23/20   Giavanni Odonovan, Linden Dolin, PA-C  cloNIDine (CATAPRES) 0.1 MG tablet TAKE 1 TABLET BY MOUTH 3 TIMES DAILY. 01/17/20   [name], MD  clopidogrel (PLAVIX) 75 MG tablet TAKE 1 TABLET BY MOUTH EVERY  DAY Patient taking differently: Take 75 mg by mouth daily.  12/18/18   [name], MD  Docusate Calcium (STOOL SOFTENER PO) Take by mouth.    [provider]  lisinopril (ZESTRIL) 2.5 MG tablet TAKE 1 TABLET BY MOUTH EVERY DAY 01/21/20   [name], MD  loratadine (CLARITIN) 10 MG tablet Take 10 mg by mouth daily.    [provider]  Multiple Vitamins-Minerals (CENTRUM SILVER ADULT 50+ PO) Take 1 tablet by mouth daily.    [provider]  prochlorperazine (COMPAZINE) 10 MG tablet Take 1 tablet (10 mg total) by mouth daily as needed (sleep, to be given with benadryl). Patient not taking: Reported on 11/19/2019 07/11/19   Sidney Ace, MD    Allergies Patient has no known allergies.  Family History  Problem Relation Age of Onset  . CAD Brother 23       MI  . Heart disease Brother        before age 26  . Heart attack Brother   . Hypertension Mother   . Cirrhosis Father 4       EtOHic  . Aneurysm Paternal Uncle 72  . Heart disease Daughter   . Hyperlipidemia Daughter     Social History Social History   Tobacco Use  . Smoking status: Former Smoker    Packs/day: 1.50    Years: 33.00    Pack years: 49.50    Types: Cigarettes    Quit date: 06/18/1994    Years since quitting: 25.7  . Smokeless tobacco: Never Used  Vaping Use  . Vaping Use: Never used  Substance Use Topics  . Alcohol use: Yes    Alcohol/week: 4.0 - 5.0 standard drinks    Types: 4 - 5 Cans of beer per week  . Drug use: No    Review of Systems  Constitutional: No fever/chills Eyes: No visual changes. ENT: No sore throat. Respiratory: Denies cough Genitourinary: Negative for dysuria. Musculoskeletal: Negative for back pain.  Positive for left index finger pain and laceration Skin: Negative for rash. Psychiatric: no mood changes,     ____________________________________________   PHYSICAL EXAM:  VITAL SIGNS: ED Triage Vitals  Enc Vitals Group     BP  02/23/20 2100 (!) 207/79     Pulse Rate 02/23/20 2100 71     Resp 02/23/20 2100 19     Temp --      Temp src --      SpO2 02/23/20 2100 99 %     Weight 02/23/20 2058 190 lb (86.2 kg)     Height 02/23/20 2058 5\' 3"  (1.6 m)     Head Circumference --      Peak Flow --      Pain Score 02/23/20 2058 0     Pain Loc --      Pain Edu? --      Excl. in [place]? --     Constitutional: Alert  and oriented. Well appearing and in no acute distress. Eyes: Conjunctivae are normal.  Head: Atraumatic. Nose: No congestion/rhinnorhea. Mouth/Throat: Mucous membranes are moist.   Neck:  supple no lymphadenopathy noted Cardiovascular: Normal rate, regular rhythm.  Respiratory: Normal respiratory effort.  No retractions,  GU: deferred Musculoskeletal: FROM all extremities, warm and well perfused, large avulsion noted at the distal area of the left index finger, partial nail involvement, no obvious bone is exposed, neurovascular is intact, area is bleeding copiously Neurologic:  Normal speech and language.  Skin:  Skin is warm, dry . No rash noted. Psychiatric: Mood and affect are normal. Speech and behavior are normal.  ____________________________________________   LABS (all labs ordered are listed, but only abnormal results are displayed)  Labs Reviewed - No data to display ____________________________________________   ____________________________________________  RADIOLOGY  X-ray of the left index finger is negative for fracture or foreign body  ____________________________________________   PROCEDURES  Procedure(s) performed:   .Nerve Block  Date/Time: 02/23/2020 10:58 PM Performed by: Versie Starks, PA-C Authorized by: Versie Starks, PA-C   Consent:    Consent obtained:  Verbal   Consent given by:  Patient   Risks discussed:  Allergic reaction, infection, nerve damage, swelling and unsuccessful block Indications:    Indications:  Procedural anesthesia Location:    Body  area:  Upper extremity Pre-procedure details:    Skin preparation:  Alcohol Skin anesthesia (see MAR for exact dosages):    Skin anesthesia method:  Local infiltration Procedure details (see MAR for exact dosages):    Block needle gauge:  21 G   Anesthetic injected:  Lidocaine 1% w/o epi   Injection procedure:  Anatomic landmarks identified, incremental injection, negative aspiration for blood, anatomic landmarks palpated and introduced needle Post-procedure details:    Outcome:  Anesthesia achieved   Patient tolerance of procedure:  Tolerated well, no immediate complications Comments:     Area was cleaned with normal saline, bleeding controlled with surgicel, pt tolerated procedure well      ____________________________________________   INITIAL IMPRESSION / ASSESSMENT AND PLAN / ED COURSE  Pertinent labs & imaging results that were available during my care of the patient were reviewed by me and considered in my medical decision making (see chart for details).   Patient is 79 year old male presents emergency department with a partial finger avulsion.  See HPI  Physical exam shows patient appear well.  Partial finger avulsion noted distally to the left index finger.  No foreign body.  No fracture noted  X-ray of the left index finger does not show a fracture or foreign body  Did explain the findings to the patient.  Digital block was performed for pain control and to cleanse the area control of bleeding.  Turnicot was applied, bleeding was controlled, area was cleaned, Surgicel was applied, bleeding was controlled after the turnicot was removed, dressing applied by nursing staff   I did explain all findings to the patient and his daughter.  His concerns for elevated blood pressure.  His son-in-law is a EMT so he will be able to recheck the blood pressure at home.  Explained to them that if it is still elevated above 140/150/86 they should follow-up with his regular doctor  concerning that elevation.  They state they understand.  He is to follow-up with orthopedics for a wound check next week.  He is to call make an appointment on Monday morning.  Is given a prescription for Keflex.  Tylenol for pain as  needed.  Return if worsening.    Craig Avery was evaluated in Emergency Department on 02/23/2020 for the symptoms described in the history of present illness. He was evaluated in the context of the global COVID-19 pandemic, which necessitated consideration that the patient might be at risk for infection with the SARS-CoV-2 virus that causes COVID-19. Institutional protocols and algorithms that pertain to the evaluation of patients at risk for COVID-19 are in a state of rapid change based on information released by regulatory bodies including the CDC and federal and state organizations. These policies and algorithms were followed during the patient's care in the ED.    As part of my medical decision making, I reviewed the following data within the [place] History obtained from family, Nursing notes reviewed and incorporated, Old chart reviewed, Radiograph reviewed , Notes from prior ED visits and [place] Controlled Substance Database  ____________________________________________   FINAL CLINICAL IMPRESSION(S) / ED DIAGNOSES  Final diagnoses:  Avulsion of finger tip, initial encounter      NEW MEDICATIONS STARTED DURING THIS VISIT:  Discharge Medication List as of 02/23/2020 10:05 PM    START taking these medications   Details  cephALEXin (KEFLEX) 500 MG capsule Take 1 capsule (500 mg total) by mouth 3 (three) times daily., Starting Sat 02/23/2020, Normal         Note:  This document was prepared using Dragon voice recognition software and may include unintentional dictation errors.    Versie Starks, PA-C 02/23/20 2304    Nance Pear, MD 02/24/20 1500

## 2020-02-23 NOTE — Discharge Instructions (Addendum)
If your blood pressure is still elevated above 140/80 you should follow-up with your regular doctor to discuss blood pressure medication. [facility].  You will need to have the wound rechecked.  Take the antibiotic as prescribed.  Do not change the dressing on the finger unless you bleed through the gauze.  Let the orthopedic doctor remove this dressing You did not need a Tdap.  Your last tetanus shot was in 2016

## 2020-02-23 NOTE — ED Triage Notes (Signed)
Patient reports cut left index finger on table saw earlier.    Laceration actively bleeding noted to left index finger.

## 2020-02-29 DIAGNOSIS — S61311A Laceration without foreign body of left index finger with damage to nail, initial encounter: Secondary | ICD-10-CM | POA: Diagnosis not present

## 2020-03-07 DIAGNOSIS — S61311D Laceration without foreign body of left index finger with damage to nail, subsequent encounter: Secondary | ICD-10-CM | POA: Diagnosis not present

## 2020-03-14 DIAGNOSIS — S61311D Laceration without foreign body of left index finger with damage to nail, subsequent encounter: Secondary | ICD-10-CM | POA: Diagnosis not present

## 2020-04-30 DIAGNOSIS — D225 Melanocytic nevi of trunk: Secondary | ICD-10-CM | POA: Diagnosis not present

## 2020-04-30 DIAGNOSIS — C44219 Basal cell carcinoma of skin of left ear and external auricular canal: Secondary | ICD-10-CM | POA: Diagnosis not present

## 2020-04-30 DIAGNOSIS — Z85828 Personal history of other malignant neoplasm of skin: Secondary | ICD-10-CM | POA: Diagnosis not present

## 2020-04-30 DIAGNOSIS — D2262 Melanocytic nevi of left upper limb, including shoulder: Secondary | ICD-10-CM | POA: Diagnosis not present

## 2020-04-30 DIAGNOSIS — D0439 Carcinoma in situ of skin of other parts of face: Secondary | ICD-10-CM | POA: Diagnosis not present

## 2020-04-30 DIAGNOSIS — X32XXXA Exposure to sunlight, initial encounter: Secondary | ICD-10-CM | POA: Diagnosis not present

## 2020-04-30 DIAGNOSIS — L570 Actinic keratosis: Secondary | ICD-10-CM | POA: Diagnosis not present

## 2020-04-30 DIAGNOSIS — D2261 Melanocytic nevi of right upper limb, including shoulder: Secondary | ICD-10-CM | POA: Diagnosis not present

## 2020-04-30 DIAGNOSIS — D2271 Melanocytic nevi of right lower limb, including hip: Secondary | ICD-10-CM | POA: Diagnosis not present

## 2020-04-30 DIAGNOSIS — D485 Neoplasm of uncertain behavior of skin: Secondary | ICD-10-CM | POA: Diagnosis not present

## 2020-05-13 ENCOUNTER — Other Ambulatory Visit: Payer: Self-pay | Admitting: Family Medicine

## 2020-05-14 NOTE — Telephone Encounter (Signed)
Plavix Last filled:  09/16/19, #90 Last OV:  11/19/19, AWV prt 2 Next OV:  05/21/20, 6 mo f/u

## 2020-05-21 ENCOUNTER — Encounter: Payer: Self-pay | Admitting: Family Medicine

## 2020-05-21 ENCOUNTER — Ambulatory Visit (INDEPENDENT_AMBULATORY_CARE_PROVIDER_SITE_OTHER): Payer: Medicare Other | Admitting: Family Medicine

## 2020-05-21 ENCOUNTER — Other Ambulatory Visit: Payer: Self-pay

## 2020-05-21 VITALS — BP 158/76 | HR 73 | Temp 98.0°F | Ht 63.0 in | Wt 192.1 lb

## 2020-05-21 DIAGNOSIS — N1831 Chronic kidney disease, stage 3a: Secondary | ICD-10-CM | POA: Diagnosis not present

## 2020-05-21 DIAGNOSIS — E785 Hyperlipidemia, unspecified: Secondary | ICD-10-CM

## 2020-05-21 DIAGNOSIS — I10 Essential (primary) hypertension: Secondary | ICD-10-CM

## 2020-05-21 DIAGNOSIS — Z23 Encounter for immunization: Secondary | ICD-10-CM | POA: Diagnosis not present

## 2020-05-21 MED ORDER — CLONIDINE HCL 0.1 MG PO TABS: 0.1000 mg | ORAL_TABLET | Freq: Two times a day (BID) | ORAL | 1 refills | Status: DC

## 2020-05-21 MED ORDER — LISINOPRIL 5 MG PO TABS: 5.0000 mg | ORAL_TABLET | Freq: Every day | ORAL | 1 refills | Status: DC

## 2020-05-21 NOTE — Assessment & Plan Note (Signed)
Atorvastatin does increased to 40mg  11/2019 - will check FLP when he returns fasting for blood work. Goal LDL <70 given stroke history

## 2020-05-21 NOTE — Progress Notes (Signed)
This visit was conducted in person.  BP (!) 158/76 (BP Location: Right Arm, Cuff Size: Large)   Pulse 73   Temp 98 F (36.7 C) (Temporal)   Ht 5\' 3"  (1.6 m)   Wt 192 lb 1 oz (87.1 kg)   SpO2 96%   BMI 34.02 kg/m    CC: 6 mo f/u visit  Subjective:    Patient ID: Craig Avery, male    DOB: 19-Dec-1940, 79 y.o.   MRN: [id number]  HPI: Craig Avery is a 79 y.o. male presenting on 05/21/2020 for Follow-up (Here for 6 mo f/u.)   COVID illness 06/2019 with prolonged recovery. No appetite since COVID. Taste and smell intact.   HTN - Compliant with current antihypertensive regimen of amlodipine 10mg  daily, clonidine 0.1mg  TID (actually only taking BID - as trouble getting mid day dose in), lisinopril 2.5mg  daily. Does not check blood pressures at home. No low blood pressure readings or symptoms of dizziness/syncope. Denies HA, vision changes, CP/tightness, SOB, leg swelling.    Known PAD - denies significant claudication at this time.  No upcoming cardiology appt scheduled.      Relevant past medical, surgical, family and social history reviewed and updated as indicated. Interim medical history since our last visit reviewed. Allergies and medications reviewed and updated. Outpatient Medications Prior to Visit  Medication Sig Dispense Refill  . amLODipine (NORVASC) 10 MG tablet TAKE 1 TABLET BY MOUTH EVERY DAY 90 tablet 1  . aspirin 81 MG tablet Take 81 mg by mouth daily.    Marland Kitchen atorvastatin (LIPITOR) 40 MG tablet Take 1 tablet (40 mg total) by mouth daily. 90 tablet 3  . cephALEXin (KEFLEX) 500 MG capsule Take 1 capsule (500 mg total) by mouth 3 (three) times daily. 21 capsule 0  . clopidogrel (PLAVIX) 75 MG tablet TAKE 1 TABLET BY MOUTH EVERY DAY 90 tablet 3  . Docusate Calcium (STOOL SOFTENER PO) Take by mouth.    . loratadine (CLARITIN) 10 MG tablet Take 10 mg by mouth daily.    . Multiple Vitamins-Minerals (CENTRUM SILVER ADULT 50+ PO) Take 1 tablet by mouth daily.    .  prochlorperazine (COMPAZINE) 10 MG tablet Take 1 tablet (10 mg total) by mouth daily as needed (sleep, to be given with benadryl). 30 tablet 0  . cloNIDine (CATAPRES) 0.1 MG tablet TAKE 1 TABLET BY MOUTH 3 TIMES DAILY. (Patient taking differently: Takes twice a day) 270 tablet 3  . lisinopril (ZESTRIL) 2.5 MG tablet TAKE 1 TABLET BY MOUTH EVERY DAY 90 tablet 3   No facility-administered medications prior to visit.     Per HPI unless specifically indicated in ROS section below Review of Systems Objective:  BP (!) 158/76 (BP Location: Right Arm, Cuff Size: Large)   Pulse 73   Temp 98 F (36.7 C) (Temporal)   Ht 5\' 3"  (1.6 m)   Wt 192 lb 1 oz (87.1 kg)   SpO2 96%   BMI 34.02 kg/m   Wt Readings from Last 3 Encounters:  05/21/20 192 lb 1 oz (87.1 kg)  02/23/20 190 lb (86.2 kg)  11/19/19 186 lb 1 oz (84.4 kg)      Physical Exam Constitutional:      Appearance: Normal appearance. He is not ill-appearing.  Cardiovascular:     Rate and Rhythm: Normal rate and regular rhythm.     Pulses: Normal pulses.     Heart sounds: Normal heart sounds. No murmur heard.   Pulmonary:  Effort: Pulmonary effort is normal. No respiratory distress.     Breath sounds: Normal breath sounds. No wheezing, rhonchi or rales.  Musculoskeletal:     Right lower leg: No edema.     Left lower leg: No edema.  Skin:    General: Skin is warm and dry.     Findings: Erythema and rash present.     Comments: Healing lesion to L cheek after recent derm treatment  Neurological:     Mental Status: He is alert.  Psychiatric:        Mood and Affect: Mood normal.        Behavior: Behavior normal.       Lab Results  Component Value Date   CREATININE 1.45 11/14/2019   BUN 27 (H) 11/14/2019   NA 142 11/14/2019   K 4.3 11/14/2019   CL 107 11/14/2019   CO2 29 11/14/2019    Assessment & Plan:  This visit occurred during the SARS-CoV-2 public health emergency.  Safety protocols were in place, including  screening questions prior to the visit, additional usage of staff PPE, and extensive cleaning of exam room while observing appropriate contact time as indicated for disinfecting solutions.   Problem List Items Addressed This Visit    Hyperlipidemia LDL goal <70    Atorvastatin does increased to 40mg  11/2019 - will check FLP when he returns fasting for blood work. Goal LDL <70 given stroke history       Relevant Medications   lisinopril (ZESTRIL) 5 MG tablet   cloNIDine (CATAPRES) 0.1 MG tablet   Other Relevant Orders   Lipid panel   Comprehensive metabolic panel   HTN (hypertension) - Primary (Chronic)    Chronic, deteriorated. He's been taking clonidine 0.1mg  only BID. Will increase lisinopril to 5mg  dose, RTC 2 wks lab visit only. Update [place] with home readings in 3-4 wks.       Relevant Medications   lisinopril (ZESTRIL) 5 MG tablet   cloNIDine (CATAPRES) 0.1 MG tablet   CKD (chronic kidney disease) stage 3, GFR 30-59 ml/min (HCC)   Relevant Orders   Comprehensive metabolic panel    Other Visit Diagnoses    Need for influenza vaccination       Relevant Orders   Flu Vaccine QUAD High Dose(Fluad) (Completed)       Meds ordered this encounter  Medications  . lisinopril (ZESTRIL) 5 MG tablet    Sig: Take 1 tablet (5 mg total) by mouth daily.    Dispense:  90 tablet    Refill:  1    Note new sig  . cloNIDine (CATAPRES) 0.1 MG tablet    Sig: Take 1 tablet (0.1 mg total) by mouth 2 (two) times daily.    Dispense:  180 tablet    Refill:  1    Note new sig   Orders Placed This Encounter  Procedures  . Flu Vaccine QUAD High Dose(Fluad)  . Lipid panel    Standing Status:   Future    Standing Expiration Date:   05/21/2021  . Comprehensive metabolic panel    Standing Status:   Future    Standing Expiration Date:   05/21/2021    Patient Instructions  You can get COVID booster anytime now (6 months from last [place] shot). Flu shot today  Blood pressures was staying elevated  today - increase lisinopril to 5mg  daily (double up on 2.5mg  tablets at home until you run out). New dose will be at pharmacy. Start monitoring blood  pressures at home and let me know readings.  Call to schedule follow up appointment with cardiology Dr Donivan Scull office as overdue.  Return 2 weeks after starting higher lisinopril dose for fasting lab visit one morning.   Follow up plan: Return in about 6 months (around 11/18/2020) for medicare wellness visit.  [name], MD

## 2020-05-21 NOTE — Assessment & Plan Note (Signed)
Chronic, deteriorated. He's been taking clonidine 0.1mg  only BID. Will increase lisinopril to 5mg  dose, RTC 2 wks lab visit only. Update [place] with home readings in 3-4 wks.

## 2020-05-21 NOTE — Patient Instructions (Addendum)
You can get COVID booster anytime now (6 months from last [place] shot). Flu shot today  Blood pressures was staying elevated today - increase lisinopril to 5mg  daily (double up on 2.5mg  tablets at home until you run out). New dose will be at pharmacy. Start monitoring blood pressures at home and let me know readings.  Call to schedule follow up appointment with cardiology Dr Donivan Scull office as overdue.  Return 2 weeks after starting higher lisinopril dose for fasting lab visit one morning.

## 2020-06-03 ENCOUNTER — Other Ambulatory Visit (INDEPENDENT_AMBULATORY_CARE_PROVIDER_SITE_OTHER): Payer: Medicare Other

## 2020-06-03 ENCOUNTER — Other Ambulatory Visit: Payer: Self-pay

## 2020-06-03 DIAGNOSIS — E785 Hyperlipidemia, unspecified: Secondary | ICD-10-CM | POA: Diagnosis not present

## 2020-06-03 DIAGNOSIS — N1831 Chronic kidney disease, stage 3a: Secondary | ICD-10-CM

## 2020-06-03 LAB — COMPREHENSIVE METABOLIC PANEL
ALT: 17 U/L (ref 0–53)
AST: 18 U/L (ref 0–37)
Albumin: 4 g/dL (ref 3.5–5.2)
Alkaline Phosphatase: 58 U/L (ref 39–117)
BUN: 20 mg/dL (ref 6–23)
CO2: 27 mEq/L (ref 19–32)
Calcium: 9 mg/dL (ref 8.4–10.5)
Chloride: 104 mEq/L (ref 96–112)
Creatinine, Ser: 1.54 mg/dL — ABNORMAL HIGH (ref 0.40–1.50)
GFR: 42.15 mL/min — ABNORMAL LOW (ref >60.00)
Glucose, Bld: 107 mg/dL — ABNORMAL HIGH (ref 70–99)
Potassium: 4.3 mEq/L (ref 3.5–5.1)
Sodium: 138 mEq/L (ref 135–145)
Total Bilirubin: 0.6 mg/dL (ref 0.2–1.2)
Total Protein: 6.6 g/dL (ref 6.0–8.3)

## 2020-06-03 LAB — LIPID PANEL
Cholesterol: 140 mg/dL (ref 0–200)
HDL: 61.9 mg/dL (ref >39.00)
LDL Cholesterol: 63 mg/dL (ref 0–99)
NonHDL: 77.96
Total CHOL/HDL Ratio: 2
Triglycerides: 76 mg/dL (ref 0.0–149.0)
VLDL: 15.2 mg/dL (ref 0.0–40.0)

## 2020-06-04 ENCOUNTER — Other Ambulatory Visit: Payer: Medicare Other

## 2020-06-11 DIAGNOSIS — B358 Other dermatophytoses: Secondary | ICD-10-CM | POA: Diagnosis not present

## 2020-06-11 DIAGNOSIS — C44229 Squamous cell carcinoma of skin of left ear and external auricular canal: Secondary | ICD-10-CM | POA: Diagnosis not present

## 2020-06-11 DIAGNOSIS — C44219 Basal cell carcinoma of skin of left ear and external auricular canal: Secondary | ICD-10-CM | POA: Diagnosis not present

## 2020-06-11 DIAGNOSIS — D485 Neoplasm of uncertain behavior of skin: Secondary | ICD-10-CM | POA: Diagnosis not present

## 2020-08-22 ENCOUNTER — Other Ambulatory Visit: Payer: Self-pay

## 2020-08-22 DIAGNOSIS — I6523 Occlusion and stenosis of bilateral carotid arteries: Secondary | ICD-10-CM

## 2020-08-27 ENCOUNTER — Encounter: Payer: Self-pay | Admitting: Family Medicine

## 2020-08-28 NOTE — Telephone Encounter (Signed)
Placed form in Dr. G's box.  

## 2020-08-29 NOTE — Telephone Encounter (Signed)
Patient returned your call. Please call him back. EM

## 2020-08-29 NOTE — Telephone Encounter (Signed)
Lvm asking pt to call back.  Need the answers to Dr. Synthia Innocent questions.

## 2020-08-29 NOTE — Telephone Encounter (Signed)
Sure I can fill out plz call pt - is he able to walk 200 ft without stopping to rest? Does he use cane when hip bothers him?

## 2020-08-29 NOTE — Telephone Encounter (Signed)
Spoke with pt.  States he does not need to use a cane.  But when his hip hurts, he does have to stop and rest when walking a "good" distance.

## 2020-08-30 NOTE — Telephone Encounter (Signed)
plz specify "a good distance" to see if it's more or less than walking from exam room to parking lot.  Need to quantify distance for eligibility criteria for DMV placard.

## 2020-09-01 NOTE — Telephone Encounter (Signed)
Spoke with pt to get details on the distance he can walk with hip pain.  Used example of exam room to parking lot.  Pt states he would probably just make it to the parking lot but not further than that before he would need to stop.

## 2020-09-01 NOTE — Telephone Encounter (Signed)
Filled and in Lisa's box 

## 2020-09-01 NOTE — Telephone Encounter (Signed)
Notified pt by phn form is ready.  Pt expresses his thanks and asks that it be mailed.   Mailed form.

## 2020-09-04 ENCOUNTER — Other Ambulatory Visit
Admission: RE | Admit: 2020-09-04 | Discharge: 2020-09-04 | Disposition: A | Discharge status: Home / Self Care | Payer: Medicare Other | Attending: Internal Medicine | Admitting: Internal Medicine

## 2020-09-04 ENCOUNTER — Encounter: Payer: Self-pay | Admitting: Internal Medicine

## 2020-09-04 ENCOUNTER — Other Ambulatory Visit: Payer: Self-pay

## 2020-09-04 ENCOUNTER — Ambulatory Visit (INDEPENDENT_AMBULATORY_CARE_PROVIDER_SITE_OTHER): Payer: Medicare Other | Admitting: Internal Medicine

## 2020-09-04 VITALS — BP 160/78 | HR 80 | Ht 64.0 in | Wt 198.0 lb

## 2020-09-04 DIAGNOSIS — I10 Essential (primary) hypertension: Secondary | ICD-10-CM | POA: Diagnosis not present

## 2020-09-04 DIAGNOSIS — I739 Peripheral vascular disease, unspecified: Secondary | ICD-10-CM | POA: Diagnosis not present

## 2020-09-04 DIAGNOSIS — I6523 Occlusion and stenosis of bilateral carotid arteries: Secondary | ICD-10-CM | POA: Diagnosis not present

## 2020-09-04 DIAGNOSIS — E785 Hyperlipidemia, unspecified: Secondary | ICD-10-CM | POA: Diagnosis not present

## 2020-09-04 LAB — BASIC METABOLIC PANEL
Anion gap: 10 (ref 5–15)
BUN: 26 mg/dL — ABNORMAL HIGH (ref 8–23)
CO2: 26 mmol/L (ref 22–32)
Calcium: 8.9 mg/dL (ref 8.9–10.3)
Chloride: 103 mmol/L (ref 98–111)
Creatinine, Ser: 1.58 mg/dL — ABNORMAL HIGH (ref 0.61–1.24)
GFR, Estimated: 44 mL/min — ABNORMAL LOW (ref >60)
Glucose, Bld: 94 mg/dL (ref 70–99)
Potassium: 4.4 mmol/L (ref 3.5–5.1)
Sodium: 139 mmol/L (ref 135–145)

## 2020-09-04 MED ORDER — LISINOPRIL 5 MG PO TABS: 10.0000 mg | ORAL_TABLET | Freq: Every day | ORAL | 1 refills | Status: DC

## 2020-09-04 NOTE — Patient Instructions (Signed)
Medication Instructions:  Your physician has recommended you make the following change in your medication:  1- STOP Aspirin. 2- INCREASE Lisinopril to 10 mg by mouth once a day.  *If you need a refill on your cardiac medications before your next appointment, please call your pharmacy*  Lab Work: 1- Your physician recommends that you return for lab work in: [place] at [employer].  2- Your physician recommends that you return for lab work in: Prior to next appointment before you come down to our office. Stop by the Medical mall for BMET. - Please go to the [facility]. You will check in at the front desk to the right as you walk into the atrium. Valet Parking is offered if needed. - No appointment needed. You may go any day between 7 am and 6 pm.  If you have labs (blood work) drawn today and your tests are completely normal, you will receive your results only by: Marland Kitchen MyChart Message (if you have MyChart) OR . A paper copy in the mail If you have any lab test that is abnormal or we need to change your treatment, we will call you to review the results.   Testing/Procedures:  Your physician has requested that you have an ankle brachial index (ABI) IN 6 MONTHS PRIOR TO appointment WITH DR END. During this test an ultrasound and blood pressure cuff are used to evaluate the arteries that supply the arms and legs with blood. Allow thirty minutes for this exam. There are no restrictions or special instructions.     Follow-Up:  Your physician recommends that you schedule a follow-up appointment in: 2-3 WEEKS WITH AN APP.   At [facility], you and your health needs are our priority.  As part of our continuing mission to provide you with exceptional heart care, we have created designated Provider Care Teams.  These Care Teams include your primary Cardiologist (physician) and Advanced Practice Providers (APPs -  Physician Assistants and Nurse Practitioners) who all work together  to provide you with the care you need, when you need it.  We recommend signing up for the patient portal called "MyChart".  Sign up information is provided on this After Visit Summary.  MyChart is used to connect with patients for Virtual Visits (Telemedicine).  Patients are able to view lab/test results, encounter notes, upcoming appointments, etc.  Non-urgent messages can be sent to your provider as well.   To learn more about what you can do with MyChart, go to [email or web address].    Your next appointment:   6 month(s)  The format for your next appointment:   In Person  Provider:   Nelva Bush, MD

## 2020-09-04 NOTE — Progress Notes (Signed)
Follow-up Outpatient Visit Date: 09/04/2020  Primary Care Provider: [name], MD [place] [address]   Primary Cardiologist: Esmond Plants, MD PhD  Chief Complaint: Follow-up PAD  HPI:  Craig Avery is a 79 y.o. male with history of carotid artery stenosis complicated by stroke status post left carotid endarterectomy, peripheral vascular disease with moderate bilateral lower extremity outflow disease, hypertension, hyperlipidemia, and obesity, who presents for follow-up of PAD.  Dr. Rockey Situ is his primary cardiologist, though it does not appear the to have met since 01/2019.  I last saw him a year ago, at which time Craig Avery was recovering from COVID-19 infection.  He reported gradually improving exertional dyspnea.  He had stable claudication when walking greater than 200 yards.  There was no evidence of critical limb ischemia.  Decision was made to continue with medical therapy.  Today, Craig Avery reports that he has stable crampy pain in his calves when he walks more than 2 to 300 yards.  Pain resolves promptly when he stops for a minute or 2.  He does not have any symptoms at rest nor wounds on his feet.  He has stable exertional dyspnea that has been present for years.  He denies chest pain, palpitations, lightheadedness, and edema.  He has not been checking his blood pressure regularly at home.  He bruises easily but otherwise has not had any significant bleeding.  --------------------------------------------------------------------------------------------------  Cardiovascular History & Procedures: Cardiovascular Problems:  Peripheral vascular disease with claudication  Stroke with carotid artery stenosis  Risk Factors:  Peripheral vascular disease, hypertension, hyperlipidemia, male gender, obesity,and age greater than 49  Cath/PCI:  None  CV Surgery:  Left carotid endarterectomy (07/21/2012, Dr. Trula Slade)  EP Procedures and  Devices:  None  Non-Invasive Evaluation(s):  Carotid Doppler (02/05/2019): 1-39% stenosis in both ICAs.  Greater than 50% right ECA stenosis.  Question proximal vertebral artery stenosis.  Patent subclavian flow.  ABIs (01/01/2019): 1.3 on the right, 0.94 on the left.  TBI 0.61 on the right, 0.39 on the left.  BLE arterial Dopplers with ABIs (12/02/2017): 50 to 74% stenosis in the right CFA. 30 to 49% stenosis in the right SFA. 30 to 49% stenosis in the left popliteal artery. ABIs are normal bilaterally. TBI's are mildly abnormal bilaterally.  AAA screen (09/30/2017): Normal sized abdominal aorta. Bilateral iliac disease greater than 50%, extending to the proximal common femoral artery on the right.  Exercise MPI (05/05/2017): Low risk study without ischemia. Apical thinning versus attenuation artifact noted. LVEF 67%.  Carotid Doppler (02/28/2017): Right internal carotid artery with 1-39% stenosis. Patent left carotid endarterectomy site without restenosis. Right external carotid artery stenosis noted. Overall, no change since 02/2016.   Recent CV Pertinent Labs: Lab Results  Component Value Date   CHOL 140 06/03/2020   CHOL 140 09/25/2012   HDL 61.90 06/03/2020   HDL 47 09/25/2012   LDLCALC 63 06/03/2020   LDLCALC 72 09/25/2012   LDLDIRECT 74.0 07/07/2016   TRIG 76.0 06/03/2020   CHOLHDL 2 06/03/2020   INR 0.91 07/14/2012   BNP 31.0 07/06/2019   K 4.3 06/03/2020   MG 1.8 07/20/2019   BUN 20 06/03/2020   CREATININE 1.54 (H) 06/03/2020    Past medical and surgical history were reviewed and updated in EPIC.  Current Meds  Medication Sig  . amLODipine (NORVASC) 10 MG tablet TAKE 1 TABLET BY MOUTH EVERY DAY  . aspirin 81 MG tablet Take 81 mg by mouth daily.  Marland Kitchen  atorvastatin (LIPITOR) 40 MG tablet Take 1 tablet (40 mg total) by mouth daily.  . cloNIDine (CATAPRES) 0.1 MG tablet Take 1 tablet (0.1 mg total) by mouth 2 (two) times daily.  . clopidogrel (PLAVIX) 75 MG  tablet TAKE 1 TABLET BY MOUTH EVERY DAY  . Docusate Calcium (STOOL SOFTENER PO) Take by mouth.  Marland Kitchen lisinopril (ZESTRIL) 5 MG tablet Take 1 tablet (5 mg total) by mouth daily.  Marland Kitchen loratadine (CLARITIN) 10 MG tablet Take 10 mg by mouth daily.  . Multiple Vitamins-Minerals (CENTRUM SILVER ADULT 50+ PO) Take 1 tablet by mouth daily.  . prochlorperazine (COMPAZINE) 10 MG tablet Take 1 tablet (10 mg total) by mouth daily as needed (sleep, to be given with benadryl).    Allergies: Patient has no known allergies.  Social History   Tobacco Use  . Smoking status: Former Smoker    Packs/day: 1.50    Years: 33.00    Pack years: 49.50    Types: Cigarettes    Quit date: 06/18/1994    Years since quitting: 26.2  . Smokeless tobacco: Never Used  Vaping Use  . Vaping Use: Never used  Substance Use Topics  . Alcohol use: Yes    Alcohol/week: 8.0 - 10.0 standard drinks    Types: 8 - 10 Cans of beer per week    Comment: weekly  . Drug use: No    Family History  Problem Relation Age of Onset  . CAD Brother 76       MI  . Heart disease Brother        before age 60  . Heart attack Brother   . Hypertension Mother   . Cirrhosis Father 24       EtOHic  . Aneurysm Paternal Uncle 24  . Heart disease Daughter   . Hyperlipidemia Daughter     Review of Systems: A 12-system review of systems was performed and was negative except as noted in the HPI.  --------------------------------------------------------------------------------------------------  Physical Exam: BP (!) 160/78 (BP Location: Left Arm, Patient Position: Sitting, Cuff Size: Large)   Pulse 80   Ht 5' 4"  (1.626 m)   Wt 198 lb (89.8 kg)   SpO2 97%   BMI 33.99 kg/m   Repeat BP 144/66  General:  NAD. Lungs: Clear to auscultation bilaterally without wheezes or crackles. Heart: Regular rate and rhythm without murmurs, rubs, or gallops. Abdomen: Soft, nontender, nondistended. Extremities: No lower extremity edema.  Pedal pulses  are trace bilaterally.  No wounds on either foot.  EKG: Normal sinus rhythm with inferior infarct and poor R wave progression.  No significant change from prior tracing on 09/03/2019.  Lab Results  Component Value Date   WBC 7.7 11/19/2019   HGB 13.1 11/19/2019   HCT 38.5 (L) 11/19/2019   MCV 87.4 11/19/2019   PLT 242.0 11/19/2019    Lab Results  Component Value Date   NA 138 06/03/2020   K 4.3 06/03/2020   CL 104 06/03/2020   CO2 27 06/03/2020   BUN 20 06/03/2020   CREATININE 1.54 (H) 06/03/2020   GLUCOSE 107 (H) 06/03/2020   ALT 17 06/03/2020    Lab Results  Component Value Date   CHOL 140 06/03/2020   HDL 61.90 06/03/2020   LDLCALC 63 06/03/2020   LDLDIRECT 74.0 07/07/2016   TRIG 76.0 06/03/2020   CHOLHDL 2 06/03/2020    --------------------------------------------------------------------------------------------------  ASSESSMENT AND PLAN: Claudication with PAD: Symptoms stable, consistent with [place] class II claudication.  No evidence of  CLI.  We have discussed importance of regular walking in an attempt to improve claudication symptoms.  I think it is reasonable to discontinue aspirin and continue monotherapy with clopidogrel.  We will plan to repeat ABIs shortly before follow-up visit in 6 months.  Carotid artery stenosis: Follow-up Dopplers scheduled for next week, with ongoing management per Dr. Trula Slade.  Hypertension: Blood pressure suboptimally controlled today.  We will check a BMP today with plans to increase lisinopril to 10 mg daily if renal function and potassium allow.  Continue current doses of amlodipine and clonidine, though long-term I think it would be beneficial to wean Craig Avery off clonidine due to side effect profile.  I will have Craig Avery return in 2 to 3 weeks for repeat BMP and blood pressure check with an APP.  Hyperlipidemia: LDL at goal on last check.  Continue atorvastatin 40 mg daily.  Follow-up: Return to clinic with APP  in 2 to 3 weeks.  Further follow-up with Dr. Rockey Situ for management of cardiac issues to be scheduled at that time.  I will see Craig Avery back in 6 months for reassessment of his claudication.  Nelva Bush, MD 09/04/2020 8:30 AM

## 2020-09-08 ENCOUNTER — Ambulatory Visit (INDEPENDENT_AMBULATORY_CARE_PROVIDER_SITE_OTHER): Payer: Medicare Other | Admitting: Physician Assistant

## 2020-09-08 ENCOUNTER — Ambulatory Visit (HOSPITAL_COMMUNITY)
Admission: RE | Admit: 2020-09-08 | Discharge: 2020-09-08 | Disposition: A | Discharge status: Home / Self Care | Payer: Medicare Other | Source: Ambulatory Visit | Attending: Surgery | Admitting: Surgery

## 2020-09-08 ENCOUNTER — Other Ambulatory Visit: Payer: Self-pay

## 2020-09-08 VITALS — BP 192/72 | HR 59 | Temp 98.7°F | Resp 20 | Ht 64.0 in | Wt 195.3 lb

## 2020-09-08 DIAGNOSIS — I6523 Occlusion and stenosis of bilateral carotid arteries: Secondary | ICD-10-CM

## 2020-09-08 NOTE — Progress Notes (Addendum)
History of Present Illness:  Patient is a 80 y.o. year old male who presents for evaluation of carotid stenosis.  S/P left CEA on 07/21/2012 by Dr. Trula Slade.  The patient denies symptoms of TIA, amaurosis, or stroke.  Hehas a history of right brain stroke as manifested by tingling on the left side of his body and severe hypertension in 2014.    Pt meds include: Statin : Yes ASA: Yes Other anticoagulants/antiplatelets: Plavix  Past Medical History:  Diagnosis Date  . Carotid stenosis 01/2013   bilateral, s/p L CEA, plan rpt [place] qyear  . HCAP (healthcare-associated pneumonia) 07/21/2019  . History of chicken pox   . HLD (hyperlipidemia)   . HTN (hypertension)   . 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 Obesity   . Pneumonia due to COVID-19 virus 06/2019   s/p hospitalization with PNA  . Stroke [facility]) 05/2012   R [facility] territory, presented with L paresthesias    Past Surgical History:  Procedure Laterality Date  . CAROTID ENDARTERECTOMY Left 07-21-12   cea  . CATARACT EXTRACTION  2004   bilateral  . COLONOSCOPY WITH PROPOFOL N/A 06/24/2015   3 polyps, one with high grade dysplasia, rpt 3 mo; [name], MD  . COLONOSCOPY WITH PROPOFOL N/A 10/14/2015   hyperplastic polyp; f/u PRN; [name], MD  . ENDARTERECTOMY  07/21/2012   Left CEA; Surgeon: [name], MD  . EYE SURGERY    . MELANOMA EXCISION  12/30/2016   right side of head above ear  . PATCH ANGIOPLASTY  07/21/2012   Procedure: PATCH ANGIOPLASTY;  Surgeon: [name], MD;  Location: [facility] OR;  Service: Vascular;  Laterality: Left;  Using 1 cm x 6 cm Vascu Guard patch   . TEE WITHOUT CARDIOVERSION  05/22/2012   Procedure: TRANSESOPHAGEAL ECHOCARDIOGRAM (TEE);  Surgeon: [name], MD;  Location: [facility] ENDOSCOPY;  Service: Cardiovascular;  Laterality: N/A;  . WISDOM TOOTH EXTRACTION       Social History Social History   Tobacco Use  .  Smoking status: Former Smoker    Packs/day: 1.50    Years: 33.00    Pack years: 49.50    Types: Cigarettes    Quit date: 06/18/1994    Years since quitting: 26.2  . Smokeless tobacco: Never Used  Vaping Use  . Vaping Use: Never used  Substance Use Topics  . Alcohol use: Yes    Alcohol/week: 8.0 - 10.0 standard drinks    Types: 8 - 10 Cans of beer per week    Comment: weekly  . Drug use: No    Family History Family History  Problem Relation Age of Onset  . CAD Brother 22       MI  . Heart disease Brother        before age 49  . Heart attack Brother   . Hypertension Mother   . Cirrhosis Father 45       EtOHic  . Aneurysm Paternal Uncle 24  . Heart disease Daughter   . Hyperlipidemia Daughter     Allergies  No Known Allergies   Current Outpatient Medications  Medication Sig Dispense Refill  . amLODipine (NORVASC) 10 MG tablet TAKE 1 TABLET BY MOUTH EVERY DAY 90 tablet 1  . atorvastatin (LIPITOR) 40 MG tablet Take 1 tablet (40 mg total) by mouth daily. 90 tablet 3  . cloNIDine (CATAPRES) 0.1 MG tablet  Take 1 tablet (0.1 mg total) by mouth 2 (two) times daily. 180 tablet 1  . clopidogrel (PLAVIX) 75 MG tablet TAKE 1 TABLET BY MOUTH EVERY DAY 90 tablet 3  . Docusate Calcium (STOOL SOFTENER PO) Take by mouth.    Marland Kitchen lisinopril (ZESTRIL) 5 MG tablet Take 2 tablets (10 mg total) by mouth daily. 90 tablet 1  . loratadine (CLARITIN) 10 MG tablet Take 10 mg by mouth daily.    . Multiple Vitamins-Minerals (CENTRUM SILVER ADULT 50+ PO) Take 1 tablet by mouth daily.    . prochlorperazine (COMPAZINE) 10 MG tablet Take 1 tablet (10 mg total) by mouth daily as needed (sleep, to be given with benadryl). 30 tablet 0   No current facility-administered medications for this visit.    ROS:   General:  No weight loss, Fever, chills  HEENT: No recent headaches, no nasal bleeding, no visual changes, no sore throat  Neurologic: No dizziness, blackouts, seizures. No recent symptoms of  stroke or mini- stroke. No recent episodes of slurred speech, or temporary blindness.  Cardiac: No recent episodes of chest pain/pressure, no shortness of breath at rest.  No shortness of breath with exertion.  Denies history of atrial fibrillation or irregular heartbeat  Vascular: No history of rest pain in feet.  No history of claudication.  No history of non-healing ulcer, No history of DVT   Pulmonary: No home oxygen, no productive cough, no hemoptysis,  No asthma or wheezing  Musculoskeletal:  [ ]  Arthritis, [ ]  Low back pain,  [x ] Joint pain  Hematologic:No history of hypercoagulable state.  No history of easy bleeding.  No history of anemia  Gastrointestinal: No hematochezia or melena,  No gastroesophageal reflux, no trouble swallowing  Urinary: [ ]  chronic Kidney disease, [ ]  on HD - [ ]  MWF or [ ]  TTHS, [ ]  Burning with urination, [ ]  Frequent urination, [ ]  Difficulty urinating;   Skin: No rashes  Psychological: No history of anxiety,  No history of depression   Physical Examination  Vitals:   09/08/20 1025 09/08/20 1027  BP: (!) 183/72 (!) 192/72  Pulse: (!) 59   Resp: 20   Temp: 98.7 F (37.1 C)   TempSrc: Temporal   SpO2: 98%   Weight: 195 lb 4.8 oz (88.6 kg)   Height: 5\' 4"  (1.626 m)     Body mass index is 33.52 kg/m.  General:  Alert and oriented, no acute distress HEENT: Normal Neck: No bruit or JVD Pulmonary: Clear to auscultation bilaterally Cardiac: IRRegular Rate and Rhythm without murmur Gastrointestinal: Soft, non-tender, non-distended, no mass, no scars Skin: No rash Extremity Pulses:  2+ radial  Musculoskeletal: No deformity or edema  Neurologic: Upper and lower extremity motor 5/5 and symmetric  DATA:   Study Result      Right Carotid Findings:  +----------+--------+--------+--------+------------------+--------+       PSV cm/sEDV cm/sStenosisPlaque DescriptionComments   +----------+--------+--------+--------+------------------+--------+  CCA Prox 90   11                      +----------+--------+--------+--------+------------------+--------+  CCA Mid  62   12                      +----------+--------+--------+--------+------------------+--------+  CCA Distal62   10       heterogenous         +----------+--------+--------+--------+------------------+--------+  ICA Prox 64   13   1-39%  heterogenous         +----------+--------+--------+--------+------------------+--------+  ICA Mid  155   24                      +----------+--------+--------+--------+------------------+--------+  ICA Distal123   16                      +----------+--------+--------+--------+------------------+--------+  ECA    521       >50%  heterogenous         +----------+--------+--------+--------+------------------+--------+   +----------+--------+-------+----------------+-------------------+       PSV cm/sEDV cmsDescribe    Arm Pressure (mmHG)  +----------+--------+-------+----------------+-------------------+  [id number]       Multiphasic, WNL            +----------+--------+-------+----------------+-------------------+   +---------+--------+--+--------+--+---------+  VertebralPSV cm/s65EDV cm/s11Antegrade  +---------+--------+--+--------+--+---------+      Left Carotid Findings:  +----------+--------+--------+--------+------------------+--------+       PSV cm/sEDV cm/sStenosisPlaque DescriptionComments  +----------+--------+--------+--------+------------------+--------+  CCA Prox 108   13                      +----------+--------+--------+--------+------------------+--------+  CCA Mid  90    14                      +----------+--------+--------+--------+------------------+--------+  CCA Distal76   14                      +----------+--------+--------+--------+------------------+--------+  ICA Prox 55   12   1-39%  heterogenous         +----------+--------+--------+--------+------------------+--------+  ICA Mid  91   22                      +----------+--------+--------+--------+------------------+--------+  ICA Distal83   17                      +----------+--------+--------+--------+------------------+--------+  ECA    109                          +----------+--------+--------+--------+------------------+--------+   +----------+--------+--------+----------------+-------------------+       PSV cm/sEDV cm/sDescribe    Arm Pressure (mmHG)  +----------+--------+--------+----------------+-------------------+  [id number]       Multiphasic, WNL            +----------+--------+--------+----------------+-------------------+   +---------+--------+--+--------+--+---------+  VertebralPSV cm/s60EDV cm/s13Antegrade  +---------+--------+--+--------+--+---------+        Summary:  Right Carotid: Velocities in the right ICA are consistent with a 1-39%  stenosis.         The ECA appears >50% stenosed.   Left Carotid: Velocities in the left ICA are consistent with a 1-39%  stenosis.   Vertebrals: Bilateral vertebral arteries demonstrate antegrade flow.  Subclavians: Normal flow hemodynamics were seen in bilateral subclavian        arteries.      ASSESSMENT:  S/P left CEA in 2014 by Dr. Trula Slade with history of TIA right brain in 2014 no subsequent TIA or stroke. His duplex has not changed and her is < 39% stenosis B ICA.  PLAN: He will f/u in 1  year for continued surveillance and repeat carotid duplex.  If he develops symptoms of stroke or TIA he will call 911, other wise He takes a daily statin, ASA, and Plavix.    Roxy Horseman PA-C [facility] Office: [phone]  MD in clinic [place]

## 2020-09-24 NOTE — Progress Notes (Signed)
Cardiology Office Note:    Date:  09/25/2020   ID:  Craig Avery, DOB Apr 08, 1941, MRN [id number]  PCP:  [name], MD  [facility] Cardiologist:  Ida Rogue, MD  [facility] Electrophysiologist:  None   Referring MD: [name], MD   Chief Complaint: 3 week follow-up BP  History of Present Illness:    Craig Avery is a 80 y.o. male with a hx of carotid artery stenosis complicated by stroke s/p left carotid endarectomy, PAD with moderate bilateral lower extremity out flow disease, HTN, HLD, obesity who presents for follow-up. Has been seen by Dr. Rockey Situ in the past, but more recently, Dr. Saunders Revel. He reported stable claudication when walking greater than 200 yards. There was no evidence of critical limb ischemia.   He was last seen 09/04/20 reporting claudication symptoms, but no evidence of CLI.  Aspirin was discontinued and he was contineud on plavix. Plan for repeat ABIs in 6 months.BP suboptimally controlled and lisinopril was increased to 10 mg daily.   BP today is still significantly high at 188/80. He has not been taking BP at home. He has 3 cuffs and none of them work. He says he will purchase one today. Re-check int he office showed SBP over 200 and he was given hydralazine 25mg  once. Re-check showed BP 196/82. He was given a second Hydralazine 25mg  in the office. BP was better but still high at 186/88, and he was given a hydralazine to take at home. Of note his brother died of a stroke. Patient is completely asymptomatic. He denies chest pain. He has chronic sob, this is unchanged. No LLE, orthopnea, pnd. No fever, chills, nausea or vomiting. No headaches dizziness, lightheaded. He does eat salt. Does not do any formal activity. Part of the issues is the claudication. Generally diet is not good, this was discussed in detail.  Past Medical History:  Diagnosis Date  . Carotid stenosis 01/2013   bilateral, s/p L CEA, plan rpt [place] qyear  . HCAP  (healthcare-associated pneumonia) 07/21/2019  . History of chicken pox   . HLD (hyperlipidemia)   . HTN (hypertension)   . 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 Obesity   . Pneumonia due to COVID-19 virus 06/2019   s/p hospitalization with PNA  . Stroke [facility]) 05/2012   R PCA territory, presented with L paresthesias    Past Surgical History:  Procedure Laterality Date  . CAROTID ENDARTERECTOMY Left 07-21-12   cea  . CATARACT EXTRACTION  2004   bilateral  . COLONOSCOPY WITH PROPOFOL N/A 06/24/2015   3 polyps, one with high grade dysplasia, rpt 3 mo; [name], MD  . COLONOSCOPY WITH PROPOFOL N/A 10/14/2015   hyperplastic polyp; f/u PRN; [name], MD  . ENDARTERECTOMY  07/21/2012   Left CEA; Surgeon: [name], MD  . EYE SURGERY    . MELANOMA EXCISION  12/30/2016   right side of head above ear  . PATCH ANGIOPLASTY  07/21/2012   Procedure: PATCH ANGIOPLASTY;  Surgeon: [name], MD;  Location: [facility] OR;  Service: Vascular;  Laterality: Left;  Using 1 cm x 6 cm Vascu Guard patch   . TEE WITHOUT CARDIOVERSION  05/22/2012   Procedure: TRANSESOPHAGEAL ECHOCARDIOGRAM (TEE);  Surgeon: [name], MD;  Location: [facility] ENDOSCOPY;  Service: Cardiovascular;  Laterality: N/A;  . WISDOM TOOTH EXTRACTION      Current Medications: Current Meds  Medication  Sig  . amLODipine (NORVASC) 10 MG tablet TAKE 1 TABLET BY MOUTH EVERY DAY  . atorvastatin (LIPITOR) 40 MG tablet Take 1 tablet (40 mg total) by mouth daily.  . carvedilol (COREG) 12.5 MG tablet Take 1 tablet (12.5 mg total) by mouth 2 (two) times daily.  . clopidogrel (PLAVIX) 75 MG tablet TAKE 1 TABLET BY MOUTH EVERY DAY  . Docusate Calcium (STOOL SOFTENER PO) Take by mouth.  Marland Kitchen lisinopril (ZESTRIL) 5 MG tablet Take 2 tablets (10 mg total) by mouth daily.  Marland Kitchen loratadine (CLARITIN) 10 MG tablet Take 10 mg by mouth daily.  . Multiple Vitamins-Minerals (CENTRUM  SILVER ADULT 50+ PO) Take 1 tablet by mouth daily.  . prochlorperazine (COMPAZINE) 10 MG tablet Take 1 tablet (10 mg total) by mouth daily as needed (sleep, to be given with benadryl).  . [DISCONTINUED] cloNIDine (CATAPRES) 0.1 MG tablet Take 1 tablet (0.1 mg total) by mouth 2 (two) times daily.     Allergies:   Patient has no known allergies.   Social History   Socioeconomic History  . Marital status: Married    Spouse name: Not on file  . Number of children: Not on file  . Years of education: Not on file  . Highest education level: Not on file  Occupational History  . Not on file  Tobacco Use  . Smoking status: Former Smoker    Packs/day: 1.50    Years: 33.00    Pack years: 49.50    Types: Cigarettes    Quit date: 06/18/1994    Years since quitting: 26.2  . Smokeless tobacco: Never Used  Vaping Use  . Vaping Use: Never used  Substance and Sexual Activity  . Alcohol use: Yes    Alcohol/week: 8.0 - 10.0 standard drinks    Types: 8 - 10 Cans of beer per week    Comment: weekly  . Drug use: No  . Sexual activity: Yes  Other Topics Concern  . Not on file  Social History Narrative   Caffeine: light coffee in am, working on cutting back   Lives alone. Widower (08/2017). Daughter next door.Other children nearby.   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 Would want wife and daughters to be HCPOA. Requests info today.   Social Determinants of Health   Financial Resource Strain: Low Risk   . Difficulty of Paying Living Expenses: Not hard at all  Food Insecurity: No Food Insecurity  . Worried About Charity fundraiser in the Last Year: Never true  . Ran Out of Food in the Last Year: Never true  Transportation Needs: No Transportation Needs  . Lack  of Transportation (Medical): No  . Lack of Transportation (Non-Medical): No  Physical Activity: Inactive  . Days of Exercise per Week: 0 days  . Minutes of Exercise per Session: 0 min  Stress: No Stress Concern Present  . Feeling of Stress : Not at all  Social Connections: Not on file     Family History: The patient's family history includes Aneurysm (age of onset: 22) in his paternal uncle; CAD (age of onset: 15) in his brother; Cirrhosis (age of onset: 67) in his father; Heart attack in his brother; Heart disease in his brother and daughter; Hyperlipidemia in his  daughter; Hypertension in his mother.  ROS:   Please see the history of present illness.     All other systems reviewed and are negative.  EKGs/Labs/Other Studies Reviewed:    The following studies were reviewed today:  Carotid US 09/08/20 Summary:  Right Carotid: Velocities in the right ICA are consistent with a 1-39%  stenosis.         The ECA appears >50% stenosed.   Left Carotid: Velocities in the left ICA are consistent with a 1-39%  stenosis.   Vertebrals: Bilateral vertebral arteries demonstrate antegrade flow.  Subclavians: Normal flow hemodynamics were seen in bilateral subclavian        arteries.   ABIs  Summary:  Right: Resting right ankle-brachial index is within normal range. No  evidence of significant right lower extremity arterial disease. The right  toe-brachial index is abnormal.   Left: Resting left ankle-brachial index indicates mild left lower  extremity arterial disease. The left toe-brachial index is abnormal.   Myoview stress test 2018 Exercise myocardial perfusion imaging study with no significant  Ischemia Small region of apical thinning noted, likely attenuation artifact Normal wall motion, EF estimated at 67% No EKG changes concerning for ischemia at peak stress or in recovery. Low risk scan   Signed, Esmond Plants, MD, Ph.D [facility]   EKG:  EKG is not  ordered today.    Recent Labs: 11/19/2019: Hemoglobin 13.1; Platelets 242.0 06/03/2020: ALT 17 09/04/2020: BUN 26; Creatinine, Ser 1.58; Potassium 4.4; Sodium 139  Recent Lipid Panel    Component Value Date/Time   CHOL 140 06/03/2020 1008   CHOL 140 09/25/2012 0805   TRIG 76.0 06/03/2020 1008   HDL 61.90 06/03/2020 1008   HDL 47 09/25/2012 0805   CHOLHDL 2 06/03/2020 1008   VLDL 15.2 06/03/2020 1008   LDLCALC 63 06/03/2020 1008   LDLCALC 72 09/25/2012 0805   LDLDIRECT 74.0 07/07/2016 1004    Physical Exam:    VS:  BP (!) 186/88 (BP Location: Left Arm, Patient Position: Sitting, Cuff Size: Normal)   Pulse 98   Ht 5\' 4"  (1.626 m)   Wt 196 lb 6 oz (89.1 kg)   SpO2 98%   BMI 33.71 kg/m     Wt Readings from Last 3 Encounters:  09/25/20 196 lb 6 oz (89.1 kg)  09/08/20 195 lb 4.8 oz (88.6 kg)  09/04/20 198 lb (89.8 kg)     GEN: Well nourished, well developed in no acute distress HEENT: Normal NECK: No JVD; No carotid bruits LYMPHATICS: No lymphadenopathy CARDIAC: RRR, no murmurs, rubs, gallops RESPIRATORY:  Clear to auscultation without rales, wheezing or rhonchi  ABDOMEN: Soft, non-tender, non-distended MUSCULOSKELETAL:  No edema; No deformity  SKIN: Warm and dry NEUROLOGIC:  Alert and oriented x 3 PSYCHIATRIC:  Normal affect   ASSESSMENT:    1. Essential hypertension   2. Carotid stenosis, bilateral   3. Hyperlipidemia LDL goal <70   4. Claudication in peripheral vascular disease ([place])   5. PAD (peripheral artery disease) ([place])   6. Hypertensive urgency    PLAN:    In order of problems listed above:  Hypertensive Urgency HTN BP significantly elevated today, 188/80. Re-check showed SBP over 200 and he was given hydralazine 25mg  once. After 15 minutes BP was still elevated and he was given a second hydralazine. Final BP 186/88 and he was given a third hydrazine 25mg  to take at home. He is not checking BP at home since he does not have a working  cuff. He says he  will purchase a cuff today. Lisinopril was increased at the last visit. BMET showed stable creatinine, but still has level on CKD. Due to CKD will hold clonidine and only take if SBP persistently >189mmHg. I will start Coreg 12.5mg  BID. Conitnue current dose of lisinopril, although suspect this will need further tiration vs change to stronger ACE. Patient will take his BP daily and call us if it is still significantly high or is symptomatic. We will see him back in 1 week for BP check. In review looks like it has been high for at least 2 years. Consider echo at follow-up and work-up for secondary hypertension.   Claudication with PAD Reports claudication symptoms are unchanged. Continue plavix. Lifestyle changes were discussed in detail. Plan for repeat ABIs for 6 month follow-up.   Carotid artery stenosis Dopplers showed 1-39% bilaterally. Follows with Dr. Desmond Dike  HLD Continue atorvastatin 40mg  daily. LDL at goal in 05/2020 at 63.  Disposition: Follow up in 1 week(s) with APP    Signed, Merita Hawks Ninfa Meeker, PA-C  09/25/2020 3:34 PM    [place]

## 2020-09-25 ENCOUNTER — Encounter: Payer: Self-pay | Admitting: Physician Assistant

## 2020-09-25 ENCOUNTER — Ambulatory Visit (INDEPENDENT_AMBULATORY_CARE_PROVIDER_SITE_OTHER): Payer: Medicare Other | Admitting: Medical

## 2020-09-25 ENCOUNTER — Other Ambulatory Visit: Payer: Self-pay

## 2020-09-25 VITALS — BP 186/88 | HR 98 | Ht 64.0 in | Wt 196.4 lb

## 2020-09-25 DIAGNOSIS — I739 Peripheral vascular disease, unspecified: Secondary | ICD-10-CM | POA: Diagnosis not present

## 2020-09-25 DIAGNOSIS — I160 Hypertensive urgency: Secondary | ICD-10-CM

## 2020-09-25 DIAGNOSIS — I10 Essential (primary) hypertension: Secondary | ICD-10-CM

## 2020-09-25 DIAGNOSIS — E785 Hyperlipidemia, unspecified: Secondary | ICD-10-CM

## 2020-09-25 DIAGNOSIS — I6523 Occlusion and stenosis of bilateral carotid arteries: Secondary | ICD-10-CM

## 2020-09-25 MED ORDER — HYDRALAZINE HCL 10 MG PO TABS
25.0000 mg | ORAL_TABLET | Freq: Once | ORAL | Status: AC
  Administered 2020-09-25: 25 mg via ORAL

## 2020-09-25 MED ORDER — CLONIDINE HCL 0.1 MG PO TABS: 0.1000 mg | ORAL_TABLET | Freq: Two times a day (BID) | ORAL | 1 refills | Status: DC | PRN

## 2020-09-25 MED ORDER — HYDRALAZINE HCL 10 MG PO TABS
25.0000 mg | ORAL_TABLET | Freq: Once | ORAL | Status: AC
Start: 2020-09-25 — End: 2020-09-25
  Administered 2020-09-25: 25 mg via ORAL

## 2020-09-25 MED ORDER — CARVEDILOL 12.5 MG PO TABS
12.5000 mg | ORAL_TABLET | Freq: Two times a day (BID) | ORAL | 3 refills | Status: DC
Start: 2020-09-25 — End: 2021-05-06

## 2020-09-25 NOTE — Patient Instructions (Addendum)
Medication Instructions:   1) CHANGE taking Clonidine 0.1mg  to ONLY AS NEEDED for systolic (top number) blood pressure greater than 160.  May take up to two per day as needed. Please call our office if you have taken 2 tablets in one day with no change in blood pressure.   2) START Carvedilol (Coreg) 12.5mg  TWICE daily  *If you need a refill on your cardiac medications before your next appointment, please call your pharmacy*   Lab Work: None ordered   Testing/Procedures: None ordered   Follow-Up: At [facility], you and your health needs are our priority.  As part of our continuing mission to provide you with exceptional heart care, we have created designated Provider Care Teams.  These Care Teams include your primary Cardiologist (physician) and Advanced Practice Providers (APPs -  Physician Assistants and Nurse Practitioners) who all work together to provide you with the care you need, when you need it.  We recommend signing up for the patient portal called "MyChart".  Sign up information is provided on this After Visit Summary.  MyChart is used to connect with patients for Virtual Visits (Telemedicine).  Patients are able to view lab/test results, encounter notes, upcoming appointments, etc.  Non-urgent messages can be sent to your provider as well.   To learn more about what you can do with MyChart, go to [email or web address].    Your next appointment:   1 week(s)  The format for your next appointment:   In Person  Provider:   You may see Ida Rogue, MD or one of the following Advanced Practice Providers on your designated Care Team:    Murray Hodgkins, NP  Christell Faith, PA-C  Marrianne Mood, PA-C  Cadence Kathlen Mody, [place]  Laurann Montana, NP    Other Instructions  Please get a blood pressure cuff as discussed in clinic.  Monitor blood pressures daily. Contact our office if BP consistently elevated.   Please keep a log of blood pressure readings and bring  with you to follow up appointment.

## 2020-10-01 ENCOUNTER — Other Ambulatory Visit
Admission: RE | Admit: 2020-10-01 | Discharge: 2020-10-01 | Disposition: A | Discharge status: Home / Self Care | Payer: Medicare Other | Source: Ambulatory Visit | Attending: Physician Assistant | Admitting: Physician Assistant

## 2020-10-01 ENCOUNTER — Encounter: Payer: Self-pay | Admitting: Physician Assistant

## 2020-10-01 ENCOUNTER — Other Ambulatory Visit: Payer: Self-pay

## 2020-10-01 ENCOUNTER — Ambulatory Visit (INDEPENDENT_AMBULATORY_CARE_PROVIDER_SITE_OTHER): Payer: Medicare Other | Admitting: Physician Assistant

## 2020-10-01 VITALS — BP 138/78 | HR 58 | Ht 64.0 in | Wt 197.0 lb

## 2020-10-01 DIAGNOSIS — I6523 Occlusion and stenosis of bilateral carotid arteries: Secondary | ICD-10-CM

## 2020-10-01 DIAGNOSIS — I739 Peripheral vascular disease, unspecified: Secondary | ICD-10-CM

## 2020-10-01 DIAGNOSIS — I10 Essential (primary) hypertension: Secondary | ICD-10-CM

## 2020-10-01 DIAGNOSIS — E785 Hyperlipidemia, unspecified: Secondary | ICD-10-CM | POA: Diagnosis not present

## 2020-10-01 LAB — BASIC METABOLIC PANEL
Anion gap: 7 (ref 5–15)
BUN: 31 mg/dL — ABNORMAL HIGH (ref 8–23)
CO2: 25 mmol/L (ref 22–32)
Calcium: 9.1 mg/dL (ref 8.9–10.3)
Chloride: 108 mmol/L (ref 98–111)
Creatinine, Ser: 1.54 mg/dL — ABNORMAL HIGH (ref 0.61–1.24)
GFR, Estimated: 45 mL/min — ABNORMAL LOW (ref >60)
Glucose, Bld: 109 mg/dL — ABNORMAL HIGH (ref 70–99)
Potassium: 5.1 mmol/L (ref 3.5–5.1)
Sodium: 140 mmol/L (ref 135–145)

## 2020-10-01 MED ORDER — ATORVASTATIN CALCIUM 80 MG PO TABS: 80.0000 mg | ORAL_TABLET | Freq: Every day | ORAL | 1 refills | Status: DC

## 2020-10-01 NOTE — Patient Instructions (Addendum)
Medication Instructions:   Increase Atorvastatin to 80 mg daily.  *If you need a refill on your cardiac medications before your next appointment, please call your pharmacy*   Lab Work: Your physician recommends that you have lab work today at the [facility]: BMET   If you have labs (blood work) drawn today and your tests are completely normal, you will receive your results only by: Marland Kitchen MyChart Message (if you have MyChart) OR . A paper copy in the mail If you have any lab test that is abnormal or we need to change your treatment, we will call you to review the results.   Testing/Procedures: Per Jacob Moores, please move LEA doppler appointment up to be done sooner.   Follow-Up: At [facility], you and your health needs are our priority.  As part of our continuing mission to provide you with exceptional heart care, we have created designated Provider Care Teams.  These Care Teams include your primary Cardiologist (physician) and Advanced Practice Providers (APPs -  Physician Assistants and Nurse Practitioners) who all work together to provide you with the care you need, when you need it.  We recommend signing up for the patient portal called "MyChart".  Sign up information is provided on this After Visit Summary.  MyChart is used to connect with patients for Virtual Visits (Telemedicine).  Patients are able to view lab/test results, encounter notes, upcoming appointments, etc.  Non-urgent messages can be sent to your provider as well.   To learn more about what you can do with MyChart, go to [email or web address].    Your next appointment:   Please keep your follow-up appointment with Dr. Saunders Revel as scheduled.

## 2020-10-01 NOTE — Progress Notes (Addendum)
Office Visit    Patient Name: Craig Avery [id number] Date of Encounter: 10/01/2020  PCP:  Craig Avery, [place]  Cardiologist:  Dr. Rockey Avery Advanced Practice Provider:  No care team member to display Electrophysiologist:  None   Chief Complaint    Chief Complaint  Patient presents with  . Follow-up    Blood pressure--pt has readings with his from the last 35 days    80 year old male with history of coronary artery stenosis complicated by stroke s/p left carotid endarterectomy, PVD with moderate bilateral lower extremity outflow disease, hypertension, hyperlipidemia, obesity, and she presents today for follow-up of PAD with recent elevated pressures.  Past Medical History    Past Medical History:  Diagnosis Date  . Carotid stenosis 01/2013   bilateral, s/p L CEA, plan rpt [place] qyear  . HCAP (healthcare-associated pneumonia) 07/21/2019  . History of chicken pox   . HLD (hyperlipidemia)   . HTN (hypertension)   . Left homonymous hemianopsia 05/2012   from CVA - completely resolved as of 08/2012 ophtho eval (Craig Avery)  . Obesity   . Pneumonia due to COVID-19 virus 06/2019   s/p hospitalization with PNA  . Stroke [facility]) 05/2012   R PCA territory, presented with L paresthesias   Past Surgical History:  Procedure Laterality Date  . CAROTID ENDARTERECTOMY Left 07-21-12   cea  . CATARACT EXTRACTION  2004   bilateral  . COLONOSCOPY WITH PROPOFOL N/A 06/24/2015   3 polyps, one with high grade dysplasia, rpt 3 mo; Craig Lame, MD  . COLONOSCOPY WITH PROPOFOL N/A 10/14/2015   hyperplastic polyp; f/u PRN; Craig Lame, MD  . ENDARTERECTOMY  07/21/2012   Left CEA; Surgeon: Craig Mitchell, MD  . EYE SURGERY    . MELANOMA EXCISION  12/30/2016   right side of head above ear  . PATCH ANGIOPLASTY  07/21/2012   Procedure: PATCH ANGIOPLASTY;  Surgeon: Craig Mitchell, MD;  Location: [facility] OR;  Service: Vascular;  Laterality: Left;  Using 1 cm x 6 cm Vascu Guard  patch   . TEE WITHOUT CARDIOVERSION  05/22/2012   Procedure: TRANSESOPHAGEAL ECHOCARDIOGRAM (TEE);  Surgeon: Craig Perla, MD;  Location[facility] ENDOSCOPY;  Service: Cardiovascular;  Laterality: N/A;  . WISDOM TOOTH EXTRACTION      Allergies  No Known Allergies  History of Present Illness    Craig Avery is a 80 y.o. male with PMH as above.  He is normally followed by Dr. Rockey Avery, though he has been seen most recently by Dr. Harrell Gave Avery.  He has history of carotid artery stenosis, complicated by strokes s/p left carotid endarterectomy.  He has PVD with moderate bilateral lower extremity outflow disease.  He also has hypertension, hyperlipidemia, and obesity. His brother reportedly died of a stroke.   He has history of COVID-19 infection in 2021.  He states this occurred at Christmas over a year ago.  He reports gradually improving exertional dyspnea since that time.  He has had stable claudication with walking greater than 200 yards.  There has been no evidence of critical limb ischemia, however.  ASA was discontinued, and he was continued on Plavix.  Plan was for repeat ABIs in 6 months.  BP was suboptimal and lisinopril increased to 10 mg daily.  When seen in clinic 3/17, BP was uncontrolled at 188/80.  He reported 3 blood pressure cuffs at home, none of which were working appropriately.  At initial check in the office, SBP  over 200.  He received hydralazine 25 mg x 1.  Repeat check showed BP 196/82 with a second hydralazine 25 mg administered.  BP then improved to 186/88 with a third hydralazine given.  He reports he is asymptomatic with elevated blood pressures.  Today, 10/01/2020, he denies chest pain.  He continues to note dyspnea, which she states is chronic.  He notes claudication when walking more than a few steps.  He states he cannot walk very far before his leg pain and dyspnea causes him to need to stop and rest.  He reports he feels his leg pain is worse than previous visits.   Claudication today is described more of similar to a burning than cramping.  He reports feeling his heart beating at times, though not constant and usually resolving with rest.  He denies any signs or symptoms of volume overload.  He reports that he spends a lot of time with his great-grandchildren, which is enjoyable for him.  He does drink alcohol, estimated at 3-4 beverages.  He reports that he does not drink as much water as he should, and usually drinks everything else except for water.  He is uncertain of how much salt that he gets through his diet, but on review of diet, does report heavy prepackaged foods.  Mrs. Craig Avery was discussed with patient agreeable.  He reports intermittent amaurosis fugax.  He reports BP better controlled since his previous visit. SBP 130-160s with rare 170s. DBP 60-70s.  He has not had to take any clonidine since last in the office.    Home Medications    Current Outpatient Medications on File Prior to Visit  Medication Sig Dispense Refill  . amLODipine (NORVASC) 10 MG tablet TAKE 1 TABLET BY MOUTH EVERY DAY 90 tablet 1  . atorvastatin (LIPITOR) 40 MG tablet Take 1 tablet (40 mg total) by mouth daily. 90 tablet 3  . carvedilol (COREG) 12.5 MG tablet Take 1 tablet (12.5 mg total) by mouth 2 (two) times daily. 180 tablet 3  . cloNIDine (CATAPRES) 0.1 MG tablet Take 1 tablet (0.1 mg total) by mouth 2 (two) times daily as needed (for systolic BP greater than 696.). 180 tablet 1  . clopidogrel (PLAVIX) 75 MG tablet TAKE 1 TABLET BY MOUTH EVERY DAY 90 tablet 3  . Docusate Calcium (STOOL SOFTENER PO) Take by mouth.    Marland Kitchen lisinopril (ZESTRIL) 5 MG tablet Take 2 tablets (10 mg total) by mouth daily. 90 tablet 1  . loratadine (CLARITIN) 10 MG tablet Take 10 mg by mouth daily.    . Multiple Vitamins-Minerals (CENTRUM SILVER ADULT 50+ PO) Take 1 tablet by mouth daily.    . prochlorperazine (COMPAZINE) 10 MG tablet Take 1 tablet (10 mg total) by mouth daily as needed (sleep, to be  given with benadryl). 30 tablet 0   No current facility-administered medications on file prior to visit.    Review of Systems    He reports chronic dyspnea, claudication symptoms with minimal walking, amaurosis fugax.   He reports occasional tachypalpitations. He denies chest pain, pnd, orthopnea, n, v, syncope, edema, weight gain, or early satiety. All other systems reviewed and are otherwise negative except as noted above.  Physical Exam    VS:  BP 138/78   Pulse (!) 58   Ht 5\' 4"  (1.626 m)   Wt 197 lb (89.4 kg)   BMI 33.81 kg/m  , BMI Body mass index is 33.81 kg/m. GEN: Well nourished, well developed, in no acute distress.  HEENT: normal. Neck: Supple, no JVD, carotid bruits, or masses. Cardiac: RRR, no murmurs, rubs, or gallops. No clubbing, cyanosis, edema.  Radials/DP/PT 2+ and equal bilaterally.  Respiratory:  Respirations regular and unlabored, clear to auscultation bilaterally. GI: Soft, nontender, nondistended, BS + x 4. MS: no deformity or atrophy. Skin: warm and dry, no rash. Neuro:  Strength and sensation are intact. Psych: Normal affect.  Accessory Clinical Findings    ECG personally reviewed by me today - No EKG - no acute changes.  VITALS Reviewed today   Temp Readings from Last 3 Encounters:  09/08/20 98.7 F (37.1 C) (Temporal)  05/21/20 98 F (36.7 C) (Temporal)  11/19/19 97.9 F (36.6 C) (Temporal)   BP Readings from Last 3 Encounters:  10/01/20 138/78  09/25/20 (!) 186/88  09/08/20 (!) 192/72   Pulse Readings from Last 3 Encounters:  10/01/20 (!) 58  09/25/20 98  09/08/20 (!) 59    Wt Readings from Last 3 Encounters:  10/01/20 197 lb (89.4 kg)  09/25/20 196 lb 6 oz (89.1 kg)  09/08/20 195 lb 4.8 oz (88.6 kg)     LABS  reviewed today   Lab Results  Component Value Date   WBC 7.7 11/19/2019   HGB 13.1 11/19/2019   HCT 38.5 (L) 11/19/2019   MCV 87.4 11/19/2019   PLT 242.0 11/19/2019   Lab Results  Component Value Date    CREATININE 1.58 (H) 09/04/2020   BUN 26 (H) 09/04/2020   NA 139 09/04/2020   K 4.4 09/04/2020   CL 103 09/04/2020   CO2 26 09/04/2020   Lab Results  Component Value Date   ALT 17 06/03/2020   AST 18 06/03/2020   ALKPHOS 58 06/03/2020   BILITOT 0.6 06/03/2020   Lab Results  Component Value Date   CHOL 140 06/03/2020   HDL 61.90 06/03/2020   LDLCALC 63 06/03/2020   LDLDIRECT 74.0 07/07/2016   TRIG 76.0 06/03/2020   CHOLHDL 2 06/03/2020    Lab Results  Component Value Date   HGBA1C 6.1 (H) 07/19/2019   Lab Results  Component Value Date   TSH 1.108 07/22/2019     STUDIES/PROCEDURES reviewed today   Carotid US 09/08/20 Summary:  Right Carotid: Velocities in the right ICA are consistent with a 1-39%  stenosis.         The ECA appears >50% stenosed.   Left Carotid: Velocities in the left ICA are consistent with a 1-39%  stenosis.   Vertebrals: Bilateral vertebral arteries demonstrate antegrade flow.  Subclavians: Normal flow hemodynamics were seen in bilateral subclavian        arteries.   ABIs  Summary:  Right: Resting right ankle-brachial index is within normal range. No  evidence of significant right lower extremity arterial disease. The right  toe-brachial index is abnormal.   Left: Resting left ankle-brachial index indicates mild left lower  extremity arterial disease. The left toe-brachial index is abnormal.   Myoview stress test 2018 Exercise myocardial perfusion imaging study with no significant Ischemia Small region of apical thinning noted, likely attenuation artifact Normal wall motion, EF estimated at 67% No EKG changes concerning for ischemia at peak stress or in recovery. Low risk scan   Echo 2013 - Left ventricle: Systolic function was vigorous. The  estimated ejection fraction was in the range of 65% to  70%. Wall motion was normal; there were no regional wall  motion abnormalities.  - Aortic valve: No evidence of  vegetation. Mild  regurgitation.  -  Mitral valve: No evidence of vegetation.  - Left atrium: No evidence of thrombus in the atrial cavity  or appendage.  - Atrial septum: No defect or patent foramen ovale was  identified.  - Tricuspid valve: No evidence of vegetation.  - Pulmonic valve: No evidence of vegetation.  Impressions:  - Negative saline microcavitation study.  Transesophageal echocardiography. 2D and color Doppler.  Patient status: Inpatient. Location: Endoscopy.    Assessment & Plan    HTN, BP goal 130/80 --BP significantly improved from previous clinic visit at 138/78.  SBP still borderline as discussed today, though suspect that this can be improved with staying away from salt.  Reviewed salt and fluid recommendations.  Discussed that his blood pressure will be labile with increases in salt and fluid.  Home BPs reviewed with SBP 1 10-1 50s, reflecting this labile BP.  At his 2/24 visit, lisinopril was increased for BP control.  Recommend a BMET today to ensure stable renal function since his increased dose lisinopril.  At his 3/17 visit, was started on Coreg 12.5 mg twice daily due to SBP significantly elevated in office with multiple rounds of hydralazine administered.  He was continued on his previously increased dose of lisinopril.  Given HR 58 bpm today, will defer from increasing carvedilol at this time.  Given need to repeat BMET and known CKD, will defer from escalation of lisinopril at this time. Could consider transition to valsartan. Clonidine may be continued for SBP greater than 160 mmHg.  As discussed today, goal is to wean him completely off of clonidine in the near future.  He reports today that he has not taken any clonidine since last seen in the office.  If BP well controlled at RTC, consider completely discontinuing this medication.  If BP still uncontrolled at RTC, ensure that low salt and fluid diet is followed, after which time we may need to consider  additional changes to his antihypertensive regimen.  Claudication symptoms Peripheral arterial disease --Reports ongoing and now worsening symptoms of claudication. Describes a burning, in addition to his cramping. Previously, no evidence of CLI.  Stressed the importance of regular walking to improve claudication symptoms.  Continue clopidogrel.  Plan for repeat ABIs as previously ordered -today, he requests that ABIs occur sooner, given his sx.  Will ensure that these studies do occur and are scheduled sooner, given his report of worsening sx. Discussed recommendation for cholesterol, glycemic, heart rate, and blood pressure control.  Ambulation encouraged.  Carotid artery stenosis --Previous dopplers showed 1-39% bilateral stenosis. He reports amaurosis fugax today.  He is followed by Dr. Charleston Ropes, to whom I will defer further workup and treatment.  Discussed recommendation for cholesterol, glycemic, heart rate, and blood pressure control.  HLD, LDL goal < 70 --LDL has been at goal and most recently 83 on 05/2020 check; however, given his vascular disease with worsening sx and poor diet reported, will increase his statin for aggressive risk factor modification at this time.     RTC: As scheduled with primary cardiologist   Arvil Chaco, PA-C 10/01/2020

## 2020-10-06 ENCOUNTER — Other Ambulatory Visit: Payer: Self-pay

## 2020-10-06 DIAGNOSIS — I10 Essential (primary) hypertension: Secondary | ICD-10-CM

## 2020-10-06 NOTE — Progress Notes (Unsigned)
v

## 2020-10-20 ENCOUNTER — Other Ambulatory Visit: Payer: Self-pay | Admitting: Internal Medicine

## 2020-10-20 DIAGNOSIS — I739 Peripheral vascular disease, unspecified: Secondary | ICD-10-CM

## 2020-10-27 ENCOUNTER — Other Ambulatory Visit: Payer: Self-pay

## 2020-10-27 ENCOUNTER — Ambulatory Visit (INDEPENDENT_AMBULATORY_CARE_PROVIDER_SITE_OTHER): Payer: Medicare Other | Admitting: Physician Assistant

## 2020-10-27 ENCOUNTER — Ambulatory Visit (INDEPENDENT_AMBULATORY_CARE_PROVIDER_SITE_OTHER): Payer: Medicare Other

## 2020-10-27 DIAGNOSIS — I6523 Occlusion and stenosis of bilateral carotid arteries: Secondary | ICD-10-CM | POA: Diagnosis not present

## 2020-10-27 DIAGNOSIS — I10 Essential (primary) hypertension: Secondary | ICD-10-CM | POA: Diagnosis not present

## 2020-10-27 DIAGNOSIS — I739 Peripheral vascular disease, unspecified: Secondary | ICD-10-CM | POA: Diagnosis not present

## 2020-10-27 NOTE — Patient Instructions (Addendum)
BMET drawn today per order from [facility], [place].

## 2020-10-28 ENCOUNTER — Encounter: Payer: Self-pay | Admitting: Physician Assistant

## 2020-10-28 LAB — BASIC METABOLIC PANEL
BUN/Creatinine Ratio: 11 (ref 10–24)
BUN: 19 mg/dL (ref 8–27)
CO2: 22 mmol/L (ref 20–29)
Calcium: 8.8 mg/dL (ref 8.6–10.2)
Chloride: 107 mmol/L — ABNORMAL HIGH (ref 96–106)
Creatinine, Ser: 1.68 mg/dL — ABNORMAL HIGH (ref 0.76–1.27)
Glucose: 88 mg/dL (ref 65–99)
Potassium: 4.3 mmol/L (ref 3.5–5.2)
Sodium: 145 mmol/L — ABNORMAL HIGH (ref 134–144)
eGFR: 41 mL/min/{1.73_m2} — ABNORMAL LOW (ref >59)

## 2020-10-28 NOTE — Progress Notes (Deleted)
BMET drawn today per Marrianne Mood, PA.

## 2020-10-30 DIAGNOSIS — D0439 Carcinoma in situ of skin of other parts of face: Secondary | ICD-10-CM | POA: Diagnosis not present

## 2020-10-30 DIAGNOSIS — D2272 Melanocytic nevi of left lower limb, including hip: Secondary | ICD-10-CM | POA: Diagnosis not present

## 2020-10-30 DIAGNOSIS — L570 Actinic keratosis: Secondary | ICD-10-CM | POA: Diagnosis not present

## 2020-10-30 DIAGNOSIS — D2262 Melanocytic nevi of left upper limb, including shoulder: Secondary | ICD-10-CM | POA: Diagnosis not present

## 2020-10-30 DIAGNOSIS — D2271 Melanocytic nevi of right lower limb, including hip: Secondary | ICD-10-CM | POA: Diagnosis not present

## 2020-10-30 DIAGNOSIS — Z85828 Personal history of other malignant neoplasm of skin: Secondary | ICD-10-CM | POA: Diagnosis not present

## 2020-10-30 DIAGNOSIS — D225 Melanocytic nevi of trunk: Secondary | ICD-10-CM | POA: Diagnosis not present

## 2020-11-14 ENCOUNTER — Telehealth: Payer: Self-pay | Admitting: Family Medicine

## 2020-11-14 NOTE — Telephone Encounter (Signed)
Noted  

## 2020-11-14 NOTE — Telephone Encounter (Signed)
E-scribed refill.   Looks like pt was scheduled for AWV cpe on Tues, 11/18/20.  It's too late for wellness with [facility].  But, will you see if pt can come in for fasting cpe labs on Mon, 11/17/20?

## 2020-11-14 NOTE — Telephone Encounter (Signed)
Patient is scheduled EM 

## 2020-11-16 ENCOUNTER — Other Ambulatory Visit: Payer: Self-pay | Admitting: Family Medicine

## 2020-11-17 ENCOUNTER — Other Ambulatory Visit: Payer: Self-pay | Admitting: Family Medicine

## 2020-11-17 ENCOUNTER — Other Ambulatory Visit (INDEPENDENT_AMBULATORY_CARE_PROVIDER_SITE_OTHER): Payer: Medicare Other

## 2020-11-17 ENCOUNTER — Other Ambulatory Visit: Payer: Self-pay

## 2020-11-17 DIAGNOSIS — D72820 Lymphocytosis (symptomatic): Secondary | ICD-10-CM

## 2020-11-17 DIAGNOSIS — E785 Hyperlipidemia, unspecified: Secondary | ICD-10-CM

## 2020-11-17 DIAGNOSIS — N1831 Chronic kidney disease, stage 3a: Secondary | ICD-10-CM

## 2020-11-17 DIAGNOSIS — D472 Monoclonal gammopathy: Secondary | ICD-10-CM | POA: Diagnosis not present

## 2020-11-17 DIAGNOSIS — I10 Essential (primary) hypertension: Secondary | ICD-10-CM

## 2020-11-17 LAB — COMPREHENSIVE METABOLIC PANEL
ALT: 23 U/L (ref 0–53)
AST: 19 U/L (ref 0–37)
Albumin: 4.2 g/dL (ref 3.5–5.2)
Alkaline Phosphatase: 56 U/L (ref 39–117)
BUN: 25 mg/dL — ABNORMAL HIGH (ref 6–23)
CO2: 28 mEq/L (ref 19–32)
Calcium: 8.8 mg/dL (ref 8.4–10.5)
Chloride: 107 mEq/L (ref 96–112)
Creatinine, Ser: 1.72 mg/dL — ABNORMAL HIGH (ref 0.40–1.50)
GFR: 36.8 mL/min — ABNORMAL LOW (ref >60.00)
Glucose, Bld: 106 mg/dL — ABNORMAL HIGH (ref 70–99)
Potassium: 4.3 mEq/L (ref 3.5–5.1)
Sodium: 142 mEq/L (ref 135–145)
Total Bilirubin: 0.5 mg/dL (ref 0.2–1.2)
Total Protein: 6.5 g/dL (ref 6.0–8.3)

## 2020-11-17 LAB — CBC WITH DIFFERENTIAL/PLATELET
Basophils Absolute: 0.1 10*3/uL (ref 0.0–0.1)
Basophils Relative: 0.7 % (ref 0.0–3.0)
Eosinophils Absolute: 0.2 10*3/uL (ref 0.0–0.7)
Eosinophils Relative: 2.2 % (ref 0.0–5.0)
HCT: 36.8 % — ABNORMAL LOW (ref 39.0–52.0)
Hemoglobin: 12.7 g/dL — ABNORMAL LOW (ref 13.0–17.0)
Lymphocytes Relative: 46.7 % — ABNORMAL HIGH (ref 12.0–46.0)
Lymphs Abs: 3.7 10*3/uL (ref 0.7–4.0)
MCHC: 34.4 g/dL (ref 30.0–36.0)
MCV: 91.8 fl (ref 78.0–100.0)
Monocytes Absolute: 0.5 10*3/uL (ref 0.1–1.0)
Monocytes Relative: 6.7 % (ref 3.0–12.0)
Neutro Abs: 3.5 10*3/uL (ref 1.4–7.7)
Neutrophils Relative %: 43.7 % (ref 43.0–77.0)
Platelets: 269 10*3/uL (ref 150.0–400.0)
RBC: 4.01 Mil/uL — ABNORMAL LOW (ref 4.22–5.81)
RDW: 13.6 % (ref 11.5–15.5)
WBC: 8 10*3/uL (ref 4.0–10.5)

## 2020-11-17 LAB — LIPID PANEL
Cholesterol: 136 mg/dL (ref 0–200)
HDL: 49.6 mg/dL (ref >39.00)
LDL Cholesterol: 67 mg/dL (ref 0–99)
NonHDL: 86.49
Total CHOL/HDL Ratio: 3
Triglycerides: 98 mg/dL (ref 0.0–149.0)
VLDL: 19.6 mg/dL (ref 0.0–40.0)

## 2020-11-17 LAB — TSH: TSH: 4.5 u[IU]/mL (ref 0.35–4.50)

## 2020-11-17 LAB — VITAMIN D 25 HYDROXY (VIT D DEFICIENCY, FRACTURES): VITD: 39.5 ng/mL (ref 30.00–100.00)

## 2020-11-17 NOTE — Addendum Note (Signed)
Addended by: Ellamae Sia on: 11/17/2020 02:00 PM   Modules accepted: Orders

## 2020-11-18 ENCOUNTER — Encounter: Payer: Self-pay | Admitting: Family Medicine

## 2020-11-18 ENCOUNTER — Ambulatory Visit (INDEPENDENT_AMBULATORY_CARE_PROVIDER_SITE_OTHER): Payer: Medicare Other | Admitting: Family Medicine

## 2020-11-18 ENCOUNTER — Other Ambulatory Visit: Payer: Self-pay

## 2020-11-18 VITALS — BP 132/70 | HR 54 | Temp 98.0°F | Ht 63.0 in | Wt 193.4 lb

## 2020-11-18 DIAGNOSIS — E669 Obesity, unspecified: Secondary | ICD-10-CM | POA: Diagnosis not present

## 2020-11-18 DIAGNOSIS — H53462 Homonymous bilateral field defects, left side: Secondary | ICD-10-CM

## 2020-11-18 DIAGNOSIS — I739 Peripheral vascular disease, unspecified: Secondary | ICD-10-CM | POA: Diagnosis not present

## 2020-11-18 DIAGNOSIS — K5900 Constipation, unspecified: Secondary | ICD-10-CM | POA: Diagnosis not present

## 2020-11-18 DIAGNOSIS — I10 Essential (primary) hypertension: Secondary | ICD-10-CM | POA: Diagnosis not present

## 2020-11-18 DIAGNOSIS — E785 Hyperlipidemia, unspecified: Secondary | ICD-10-CM

## 2020-11-18 DIAGNOSIS — D472 Monoclonal gammopathy: Secondary | ICD-10-CM

## 2020-11-18 DIAGNOSIS — I6523 Occlusion and stenosis of bilateral carotid arteries: Secondary | ICD-10-CM | POA: Diagnosis not present

## 2020-11-18 DIAGNOSIS — D649 Anemia, unspecified: Secondary | ICD-10-CM

## 2020-11-18 DIAGNOSIS — D72820 Lymphocytosis (symptomatic): Secondary | ICD-10-CM

## 2020-11-18 DIAGNOSIS — N1832 Chronic kidney disease, stage 3b: Secondary | ICD-10-CM

## 2020-11-18 DIAGNOSIS — Z7189 Other specified counseling: Secondary | ICD-10-CM | POA: Diagnosis not present

## 2020-11-18 DIAGNOSIS — Z0000 Encounter for general adult medical examination without abnormal findings: Secondary | ICD-10-CM | POA: Diagnosis not present

## 2020-11-18 DIAGNOSIS — Z8673 Personal history of transient ischemic attack (TIA), and cerebral infarction without residual deficits: Secondary | ICD-10-CM

## 2020-11-18 NOTE — Assessment & Plan Note (Deleted)
Advanced directives - doesn't have living will. Does not want prolonged life support if terminal condition - doesn't want to be "burden". Would want daughters to be HCPOA. Packet provided today.  

## 2020-11-18 NOTE — Assessment & Plan Note (Addendum)
Residual from CVA 05/2012.  L vision trouble noted today - encouraged return to eye doctor.

## 2020-11-18 NOTE — Patient Instructions (Addendum)
You had trouble with left eye vision today - touch base with eye doctor about this.  Let us know date of COVID booster  Consider pneumonia shot as you're due. Consider shingles shots.  Continue working on advanced directive/living will - bring [place] a copy when completed. Advanced directive packet provided today  Kidneys returned impaired - increase water intake. I recommend repeat testing in 3 months - schedule lab visit for this.  Return in 6 months for follow up visit.   Health Maintenance After Age 65 After age 56, you are at a higher risk for certain long-term diseases and infections as well as injuries from falls. Falls are a major cause of broken bones and head injuries in people who are older than age 35. Getting regular preventive care can help to keep you healthy and well. Preventive care includes getting regular testing and making lifestyle changes as recommended by your health care provider. Talk with your health care provider about:  Which screenings and tests you should have. A screening is a test that checks for a disease when you have no symptoms.  A diet and exercise plan that is right for you. What should I know about screenings and tests to prevent falls? Screening and testing are the best ways to find a health problem early. Early diagnosis and treatment give you the best chance of managing medical conditions that are common after age 40. Certain conditions and lifestyle choices may make you more likely to have a fall. Your health care provider may recommend:  Regular vision checks. Poor vision and conditions such as cataracts can make you more likely to have a fall. If you wear glasses, make sure to get your prescription updated if your vision changes.  Medicine review. Work with your health care provider to regularly review all of the medicines you are taking, including over-the-counter medicines. Ask your health care provider about any side effects that may make you more likely  to have a fall. Tell your health care provider if any medicines that you take make you feel dizzy or sleepy.  Osteoporosis screening. Osteoporosis is a condition that causes the bones to get weaker. This can make the bones weak and cause them to break more easily.  Blood pressure screening. Blood pressure changes and medicines to control blood pressure can make you feel dizzy.  Strength and balance checks. Your health care provider may recommend certain tests to check your strength and balance while standing, walking, or changing positions.  Foot health exam. Foot pain and numbness, as well as not wearing proper footwear, can make you more likely to have a fall.  Depression screening. You may be more likely to have a fall if you have a fear of falling, feel emotionally low, or feel unable to do activities that you used to do.  Alcohol use screening. Using too much alcohol can affect your balance and may make you more likely to have a fall. What actions can I take to lower my risk of falls? General instructions  Talk with your health care provider about your risks for falling. Tell your health care provider if: ? You fall. Be sure to tell your health care provider about all falls, even ones that seem minor. ? You feel dizzy, sleepy, or off-balance.  Take over-the-counter and prescription medicines only as told by your health care provider. These include any supplements.  Eat a healthy diet and maintain a healthy weight. A healthy diet includes low-fat dairy products, low-fat (lean)  meats, and fiber from whole grains, beans, and lots of fruits and vegetables. Home safety  Remove any tripping hazards, such as rugs, cords, and clutter.  Install safety equipment such as grab bars in bathrooms and safety rails on stairs.  Keep rooms and walkways well-lit. Activity  Follow a regular exercise program to stay fit. This will help you maintain your balance. Ask your health care provider what  types of exercise are appropriate for you.  If you need a cane or walker, use it as recommended by your health care provider.  Wear supportive shoes that have nonskid soles.   Lifestyle  Do not drink alcohol if your health care provider tells you not to drink.  If you drink alcohol, limit how much you have: ? 0-1 drink a day for women. ? 0-2 drinks a day for men.  Be aware of how much alcohol is in your drink. In the [place] one drink equals one typical bottle of beer (12 oz), one-half glass of wine (5 oz), or one shot of hard liquor (1 oz).  Do not use any products that contain nicotine or tobacco, such as cigarettes and e-cigarettes. If you need help quitting, ask your health care provider. Summary  Having a healthy lifestyle and getting preventive care can help to protect your health and wellness after age 38.  Screening and testing are the best way to find a health problem early and help you avoid having a fall. Early diagnosis and treatment give you the best chance for managing medical conditions that are more common for people who are older than age 72.  Falls are a major cause of broken bones and head injuries in people who are older than age 53. Take precautions to prevent a fall at home.  Work with your health care provider to learn what changes you can make to improve your health and wellness and to prevent falls. This information is not intended to replace advice given to you by your health care provider. Make sure you discuss any questions you have with your health care provider. Document Revised: 10/19/2018 Document Reviewed: 05/11/2017 Elsevier Patient Education  2021 [employer].

## 2020-11-18 NOTE — Progress Notes (Signed)
Patient ID: Craig Avery, male    DOB: [birth year], 80 y.o.   MRN: [id number]  This visit was conducted in person.  BP 132/70   Pulse (!) 54   Temp 98 F (36.7 C) (Temporal)   Ht 5\' 3"  (1.6 m)   Wt 193 lb 6 oz (87.7 kg)   SpO2 97%   BMI 34.25 kg/m    CC: AMW  Subjective:   HPI: Craig Avery is a 80 y.o. male presenting on 11/18/2020 for Medicare Wellness   Did not see health advisor this year   Hearing Screening   125Hz  250Hz  500Hz  1000Hz  2000Hz  3000Hz  4000Hz  6000Hz  8000Hz   Right ear:   40 25 20  0    Left ear:   40 20 20  0      Visual Acuity Screening   Right eye Left eye Both eyes  Without correction:     With correction: 20/25 20/70 20/25     Flowsheet Row Office Visit from 11/18/2020 in [place] at [facility]  PHQ-2 Total Score 0    Encouraged he schedule f/u with eye doctor for failed L eye visiual acuity Fall Risk  11/18/2020 11/14/2019 11/10/2018 01/04/2017 06/17/2016  Falls in the past year? 0 0 0 No No  Comment - - - - Emmi Telephone Survey: data to providers prior to load  Number falls in past yr: - 0 - - -  Injury with Fall? - 0 - - -  Risk for fall due to : - Medication side effect - - -  Follow up - Falls evaluation completed;Falls prevention discussed - - -    Daughter notices hand tremor - he notices it occasionally while eating, but it is not really bothersome.   Brings BP log - 135/65, 151-159/59-67 this week   Preventative: COLONOSCOPY WITH PROPOFOL 06/24/2015 3 polyps, one with high grade dysplasia, rpt 3 mo; [name], MD COLONOSCOPY WITH PROPOFOL 10/14/2015 hyperplastic polyp; f/u PRN; Wallie Char- aged out. Prostate cancer screening -aged out  Lung cancer screening - not eligible Needle phobia  Flu shot - declines  COVID vaccine - 10/2019, 11/2019, booster x1  Pneumovax -discussed,declines  Tdap 11/2014  zostavax - declines  Advanced directives - doesn't have living will. Does not want prolonged life support if  terminal condition - doesn't want to be "burden". Would want daughters to be HCPOA. Packet provided today.  Seat belt use discussed.  Sunscreen use discussed. Recent BCC removed R scalp at [facility]. Has appt with derm later this month  Ex smoker - quit 1995  Alcohol - 3 beer/day  Dentist - full dentures but doesn't use - no appetite since COVID 06/2019 Eye exam yearly - overdue  Bowels - no diarrhea/constipation  Bladder - no incontinence  Caffeine: light coffee in am, working on cutting back Lives alone. Widower (08/2017). Daughter next door.Other children nearby. 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 Interim medical history since our last visit reviewed. Allergies and medications reviewed and updated. Outpatient Medications Prior to Visit  Medication Sig Dispense Refill  . amLODipine (NORVASC) 10 MG tablet TAKE 1 TABLET BY MOUTH EVERY DAY 90 tablet 0  . atorvastatin (LIPITOR) 80 MG tablet Take 1 tablet (80 mg total) by mouth daily. 90 tablet 1  .  carvedilol (COREG) 12.5 MG tablet Take 1 tablet (12.5 mg total) by mouth 2 (two) times daily. 180 tablet 3  . cloNIDine (CATAPRES) 0.1 MG tablet Take 1 tablet (0.1 mg total) by mouth 2 (two) times daily as needed (for systolic BP greater than 867.). 180 tablet 1  . clopidogrel (PLAVIX) 75 MG tablet TAKE 1 TABLET BY MOUTH EVERY DAY 90 tablet 3  . Docusate Calcium (STOOL SOFTENER PO) Take by mouth.    . loratadine (CLARITIN) 10 MG tablet Take 10 mg by mouth daily.    . Multiple Vitamins-Minerals (CENTRUM SILVER ADULT 50+ PO) Take 1 tablet by mouth daily.    . prochlorperazine (COMPAZINE) 10 MG tablet Take 1 tablet (10 mg total) by mouth daily as needed (sleep, to be given with benadryl). 30 tablet 0  . lisinopril  (ZESTRIL) 5 MG tablet Take 2 tablets (10 mg total) by mouth daily. 90 tablet 1   No facility-administered medications prior to visit.     Per HPI unless specifically indicated in ROS section below Review of Systems Objective:  BP 132/70   Pulse (!) 54   Temp 98 F (36.7 C) (Temporal)   Ht 5\' 3"  (1.6 m)   Wt 193 lb 6 oz (87.7 kg)   SpO2 97%   BMI 34.25 kg/m   Wt Readings from Last 3 Encounters:  11/18/20 193 lb 6 oz (87.7 kg)  10/01/20 197 lb (89.4 kg)  09/25/20 196 lb 6 oz (89.1 kg)      Physical Exam Vitals and nursing note reviewed.  Constitutional:      General: He is not in acute distress.    Appearance: Normal appearance. He is well-developed. He is not ill-appearing.  HENT:     Head: Normocephalic and atraumatic.     Right Ear: Hearing, tympanic membrane, ear canal and external ear normal.     Left Ear: Hearing, tympanic membrane, ear canal and external ear normal.  Eyes:     General: No scleral icterus.    Extraocular Movements: Extraocular movements intact.     Conjunctiva/sclera: Conjunctivae normal.     Pupils: Pupils are equal, round, and reactive to light.  Neck:     Thyroid: No thyroid mass or thyromegaly.     Vascular: Carotid bruit (R) present.  Cardiovascular:     Rate and Rhythm: Normal rate and regular rhythm.     Pulses:          Radial pulses are 2+ on the right side and 2+ on the left side.     Heart sounds: Normal heart sounds. No murmur heard.   Pulmonary:     Effort: Pulmonary effort is normal. No respiratory distress.     Breath sounds: Normal breath sounds. No wheezing, rhonchi or rales.  Abdominal:     General: Bowel sounds are normal. There is no distension.     Palpations: Abdomen is soft. There is no mass.     Tenderness: There is no abdominal tenderness. There is no guarding or rebound.     Hernia: No hernia is present.  Musculoskeletal:        General: Normal range of motion.     Cervical back: Normal range of motion and neck  supple.     Right lower leg: No edema.     Left lower leg: No edema.  Lymphadenopathy:     Cervical: No cervical adenopathy.  Skin:    General: Skin is warm and dry.     Findings: No  rash.  Neurological:     General: No focal deficit present.     Mental Status: He is alert and oriented to person, place, and time.     Comments:  Recall 3/3 Calculation 5/5 DLROW  Psychiatric:        Mood and Affect: Mood normal.        Behavior: Behavior normal.        Thought Content: Thought content normal.        Judgment: Judgment normal.       Results for orders placed or performed in visit on 11/17/20  VITAMIN D 25 Hydroxy (Vit-D Deficiency, Fractures)  Result Value Ref Range   VITD 39.50 30.00 - 100.00 ng/mL  Serum protein electrophoresis with reflex  Result Value Ref Range   Total Protein 6.6 6.1 - 8.1 g/dL   Albumin ELP 4.0 3.8 - 4.8 g/dL   Alpha 1 0.3 0.2 - 0.3 g/dL   Alpha 2 0.7 0.5 - 0.9 g/dL   Beta Globulin 0.4 0.4 - 0.6 g/dL   Beta 2 0.3 0.2 - 0.5 g/dL   Gamma Globulin 0.9 0.8 - 1.7 g/dL   Abnormal Protein Band1 NOTE NONE DETECTED g/dL   SPE Interp.    CBC with Differential/Platelet  Result Value Ref Range   WBC 8.0 4.0 - 10.5 K/uL   RBC 4.01 (L) 4.22 - 5.81 Mil/uL   Hemoglobin 12.7 (L) 13.0 - 17.0 g/dL   HCT 36.8 (L) 39.0 - 52.0 %   MCV 91.8 78.0 - 100.0 fl   MCHC 34.4 30.0 - 36.0 g/dL   RDW 13.6 11.5 - 15.5 %   Platelets 269.0 150.0 - 400.0 K/uL   Neutrophils Relative % 43.7 43.0 - 77.0 %   Lymphocytes Relative 46.7 (H) 12.0 - 46.0 %   Monocytes Relative 6.7 3.0 - 12.0 %   Eosinophils Relative 2.2 0.0 - 5.0 %   Basophils Relative 0.7 0.0 - 3.0 %   Neutro Abs 3.5 1.4 - 7.7 K/uL   Lymphs Abs 3.7 0.7 - 4.0 K/uL   Monocytes Absolute 0.5 0.1 - 1.0 K/uL   Eosinophils Absolute 0.2 0.0 - 0.7 K/uL   Basophils Absolute 0.1 0.0 - 0.1 K/uL  TSH  Result Value Ref Range   TSH 4.50 0.35 - 4.50 uIU/mL  Comprehensive metabolic panel  Result Value Ref Range   Sodium 142 135 -  145 mEq/L   Potassium 4.3 3.5 - 5.1 mEq/L   Chloride 107 96 - 112 mEq/L   CO2 28 19 - 32 mEq/L   Glucose, Bld 106 (H) 70 - 99 mg/dL   BUN 25 (H) 6 - 23 mg/dL   Creatinine, Ser 1.72 (H) 0.40 - 1.50 mg/dL   Total Bilirubin 0.5 0.2 - 1.2 mg/dL   Alkaline Phosphatase 56 39 - 117 U/L   AST 19 0 - 37 U/L   ALT 23 0 - 53 U/L   Total Protein 6.5 6.0 - 8.3 g/dL   Albumin 4.2 3.5 - 5.2 g/dL   GFR 36.80 (L) >60.00 mL/min   Calcium 8.8 8.4 - 10.5 mg/dL  Lipid panel  Result Value Ref Range   Cholesterol 136 0 - 200 mg/dL   Triglycerides 98.0 0.0 - 149.0 mg/dL   HDL 49.60 >39.00 mg/dL   VLDL 19.6 0.0 - 40.0 mg/dL   LDL Cholesterol 67 0 - 99 mg/dL   Total CHOL/HDL Ratio 3    NonHDL 86.49   IFE Interpretation  Result Value Ref Range  Immunofix Electr Int NO MONOCLONAL PROTEIN DETECTED    Assessment & Plan:  This visit occurred during the SARS-CoV-2 public health emergency.  Safety protocols were in place, including screening questions prior to the visit, additional usage of staff PPE, and extensive cleaning of exam room while observing appropriate contact time as indicated for disinfecting solutions.   Problem List Items Addressed This Visit    HTN (hypertension) (Chronic)    Chronic, stable. Continue amlodipine ,carvedilol, clonidine, and lisinopril.       History of stroke   Carotid stenosis, bilateral    Stable period s/p L CEA. Continue statin, plavix.       Hyperlipidemia LDL goal <70    Continue full dose atorvastatin 80mg  daily. Goal LDL <70 The ASCVD Risk score Mikey Bussing DC Jr., et al., 2013) failed to calculate for the following reasons:   The 2013 ASCVD risk score is only valid for ages 33 to 58       Obesity, Class I, BMI 30-34.9   Left homonymous hemianopsia    Residual from CVA 05/2012.  L vision trouble noted today - encouraged return to eye doctor.       Medicare annual wellness visit, subsequent - Primary    I have personally reviewed the Medicare Annual Wellness  questionnaire and have noted 1. The patient's medical and social history 2. Their use of alcohol, tobacco or illicit drugs 3. Their current medications and supplements 4. The patient's functional ability including ADL's, fall risks, home safety risks and hearing or visual impairment. Cognitive function has been assessed and addressed as indicated.  5. Diet and physical activity 6. Evidence for depression or mood disorders The patients weight, height, BMI have been recorded in the chart. I have made referrals, counseling and provided education to the patient based on review of the above and I have provided the pt with a written personalized care plan for preventive services. Provider list updated.. See scanned questionairre as needed for further documentation. Reviewed preventative protocols and updated unless pt declined.       Advanced care planning/counseling discussion    Advanced directives - doesn't have living will. Does not want prolonged life support if terminal condition - doesn't want to be "burden". Would want daughters to be HCPOA. Packet provided today.       CKD (chronic kidney disease) stage 3, GFR 30-59 ml/min (HCC)    Update renal panel. Check SPEP      Constipation    Continue regular colace.       PVD (peripheral vascular disease) with claudication (HCC)   Anemia    H/o normocytic anemia - update CBC      Lymphocytosis    Update CBC       MGUS (monoclonal gammopathy of unknown significance)    Update SPEP           No orders of the defined types were placed in this encounter.  No orders of the defined types were placed in this encounter.   Patient instructions: You had trouble with left eye vision today - touch base with eye doctor about this.  Let us know date of COVID booster  Consider pneumonia shot as you're due. Consider shingles shots.  Continue working on advanced directive/living will - bring [place] a copy when completed.  Kidneys returned  impaired - increase water intake. I recommend repeat testing in 3 months - schedule lab visit for this.  Advanced directive packet provided today  Return in 6 months for  follow up visit.   Follow up plan: Return in about 6 months (around 05/21/2021), or if symptoms worsen or fail to improve, for follow up visit.  [name], MD

## 2020-11-19 NOTE — Telephone Encounter (Signed)
FYI--Called pt to inform him of Rx change to one 10 mg tablet daily. Left detailed message on home phone--ok per DPR.

## 2020-11-20 LAB — PROTEIN ELECTROPHORESIS, SERUM, WITH REFLEX
Albumin ELP: 4 g/dL (ref 3.8–4.8)
Alpha 1: 0.3 g/dL (ref 0.2–0.3)
Alpha 2: 0.7 g/dL (ref 0.5–0.9)
Beta 2: 0.3 g/dL (ref 0.2–0.5)
Beta Globulin: 0.4 g/dL (ref 0.4–0.6)
Gamma Globulin: 0.9 g/dL (ref 0.8–1.7)
Total Protein: 6.6 g/dL (ref 6.1–8.1)

## 2020-11-20 LAB — IFE INTERPRETATION: Immunofix Electr Int: NOT DETECTED

## 2020-11-20 NOTE — Progress Notes (Incomplete)
Patient ID: Craig Avery, male    DOB: [birth year], 80 y.o.   MRN: [id number]  This visit was conducted in person.  BP 132/70   Pulse (!) 54   Temp 98 F (36.7 C) (Temporal)   Ht 5\' 3"  (1.6 m)   Wt 193 lb 6 oz (87.7 kg)   SpO2 97%   BMI 34.25 kg/m    CC: AMW  Subjective:   HPI: Craig Avery is a 80 y.o. male presenting on 11/18/2020 for Medicare Wellness   Did not see health advisor this year   Hearing Screening   125Hz  250Hz  500Hz  1000Hz  2000Hz  3000Hz  4000Hz  6000Hz  8000Hz   Right ear:   40 25 20  0    Left ear:   40 20 20  0      Visual Acuity Screening   Right eye Left eye Both eyes  Without correction:     With correction: 20/25 20/70 20/25     Flowsheet Row Office Visit from 11/18/2020 in [place] at [facility]  PHQ-2 Total Score 0    Encouraged he schedule f/u with eye doctor for failed L eye visiual acuity Fall Risk  11/18/2020 11/14/2019 11/10/2018 01/04/2017 06/17/2016  Falls in the past year? 0 0 0 No No  Comment - - - - Emmi Telephone Survey: data to providers prior to load  Number falls in past yr: - 0 - - -  Injury with Fall? - 0 - - -  Risk for fall due to : - Medication side effect - - -  Follow up - Falls evaluation completed;Falls prevention discussed - - -    Daughter notices hand tremor - he notices it occasionally while eating, but it is not really bothersome.   Brings BP log - 135/65, 151-159/59-67 this week   Preventative: COLONOSCOPY WITH PROPOFOL 06/24/2015 3 polyps, one with high grade dysplasia, rpt 3 mo; Craig Lame, MD COLONOSCOPY WITH PROPOFOL 10/14/2015 hyperplastic polyp; f/u PRN; Craig Avery- aged out. Prostate cancer screening -aged out  Lung cancer screening - not eligible Needle phobia  Flu shot - declines  COVID vaccine - 10/2019, 11/2019, booster x1  Pneumovax -discussed,declines  Tdap 11/2014  zostavax - declines  Advanced directives - doesn't have living will. Does not want prolonged life support if  terminal condition - doesn't want to be "burden". Would want daughters to be HCPOA. Packet provided today.  Seat belt use discussed.  Sunscreen use discussed. Recent BCC removed R scalp at [facility]. Has appt with derm later this month  Ex smoker - quit 1995  Alcohol - 3 beer/day  Dentist - full dentures but doesn't use - no appetite since COVID 06/2019 Eye exam yearly - overdue  Bowels - no diarrhea/constipation  Bladder - no incontinence  Caffeine: light coffee in am, working on cutting back Lives alone. Widower (08/2017). Daughter next door.Other children nearby. 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 Interim medical history since our last visit reviewed. Allergies and medications reviewed and updated. Outpatient Medications Prior to Visit  Medication Sig Dispense Refill  . amLODipine (NORVASC) 10 MG tablet TAKE 1 TABLET BY MOUTH EVERY DAY 90 tablet 0  . atorvastatin (LIPITOR) 80 MG tablet Take 1 tablet (80 mg total) by mouth daily. 90 tablet 1  .  carvedilol (COREG) 12.5 MG tablet Take 1 tablet (12.5 mg total) by mouth 2 (two) times daily. 180 tablet 3  . cloNIDine (CATAPRES) 0.1 MG tablet Take 1 tablet (0.1 mg total) by mouth 2 (two) times daily as needed (for systolic BP greater than 867.). 180 tablet 1  . clopidogrel (PLAVIX) 75 MG tablet TAKE 1 TABLET BY MOUTH EVERY DAY 90 tablet 3  . Docusate Calcium (STOOL SOFTENER PO) Take by mouth.    . loratadine (CLARITIN) 10 MG tablet Take 10 mg by mouth daily.    . Multiple Vitamins-Minerals (CENTRUM SILVER ADULT 50+ PO) Take 1 tablet by mouth daily.    . prochlorperazine (COMPAZINE) 10 MG tablet Take 1 tablet (10 mg total) by mouth daily as needed (sleep, to be given with benadryl). 30 tablet 0  . lisinopril  (ZESTRIL) 5 MG tablet Take 2 tablets (10 mg total) by mouth daily. 90 tablet 1   No facility-administered medications prior to visit.     Per HPI unless specifically indicated in ROS section below Review of Systems Objective:  BP 132/70   Pulse (!) 54   Temp 98 F (36.7 C) (Temporal)   Ht 5\' 3"  (1.6 m)   Wt 193 lb 6 oz (87.7 kg)   SpO2 97%   BMI 34.25 kg/m   Wt Readings from Last 3 Encounters:  11/18/20 193 lb 6 oz (87.7 kg)  10/01/20 197 lb (89.4 kg)  09/25/20 196 lb 6 oz (89.1 kg)      Physical Exam Vitals and nursing note reviewed.  Constitutional:      General: He is not in acute distress.    Appearance: Normal appearance. He is well-developed. He is not ill-appearing.  HENT:     Head: Normocephalic and atraumatic.     Right Ear: Hearing, tympanic membrane, ear canal and external ear normal.     Left Ear: Hearing, tympanic membrane, ear canal and external ear normal.  Eyes:     General: No scleral icterus.    Extraocular Movements: Extraocular movements intact.     Conjunctiva/sclera: Conjunctivae normal.     Pupils: Pupils are equal, round, and reactive to light.  Neck:     Thyroid: No thyroid mass or thyromegaly.     Vascular: Carotid bruit (R) present.  Cardiovascular:     Rate and Rhythm: Normal rate and regular rhythm.     Pulses:          Radial pulses are 2+ on the right side and 2+ on the left side.     Heart sounds: Normal heart sounds. No murmur heard.   Pulmonary:     Effort: Pulmonary effort is normal. No respiratory distress.     Breath sounds: Normal breath sounds. No wheezing, rhonchi or rales.  Abdominal:     General: Bowel sounds are normal. There is no distension.     Palpations: Abdomen is soft. There is no mass.     Tenderness: There is no abdominal tenderness. There is no guarding or rebound.     Hernia: No hernia is present.  Musculoskeletal:        General: Normal range of motion.     Cervical back: Normal range of motion and neck  supple.     Right lower leg: No edema.     Left lower leg: No edema.  Lymphadenopathy:     Cervical: No cervical adenopathy.  Skin:    General: Skin is warm and dry.     Findings: No  rash.  Neurological:     General: No focal deficit present.     Mental Status: He is alert and oriented to person, place, and time.     Comments:  Recall 3/3 Calculation 5/5 DLROW  Psychiatric:        Mood and Affect: Mood normal.        Behavior: Behavior normal.        Thought Content: Thought content normal.        Judgment: Judgment normal.       Results for orders placed or performed in visit on 11/17/20  VITAMIN D 25 Hydroxy (Vit-D Deficiency, Fractures)  Result Value Ref Range   VITD 39.50 30.00 - 100.00 ng/mL  Serum protein electrophoresis with reflex  Result Value Ref Range   Total Protein 6.6 6.1 - 8.1 g/dL   Albumin ELP 4.0 3.8 - 4.8 g/dL   Alpha 1 0.3 0.2 - 0.3 g/dL   Alpha 2 0.7 0.5 - 0.9 g/dL   Beta Globulin 0.4 0.4 - 0.6 g/dL   Beta 2 0.3 0.2 - 0.5 g/dL   Gamma Globulin 0.9 0.8 - 1.7 g/dL   Abnormal Protein Band1 NOTE NONE DETECTED g/dL   SPE Interp.    CBC with Differential/Platelet  Result Value Ref Range   WBC 8.0 4.0 - 10.5 K/uL   RBC 4.01 (L) 4.22 - 5.81 Mil/uL   Hemoglobin 12.7 (L) 13.0 - 17.0 g/dL   HCT 36.8 (L) 39.0 - 52.0 %   MCV 91.8 78.0 - 100.0 fl   MCHC 34.4 30.0 - 36.0 g/dL   RDW 13.6 11.5 - 15.5 %   Platelets 269.0 150.0 - 400.0 K/uL   Neutrophils Relative % 43.7 43.0 - 77.0 %   Lymphocytes Relative 46.7 (H) 12.0 - 46.0 %   Monocytes Relative 6.7 3.0 - 12.0 %   Eosinophils Relative 2.2 0.0 - 5.0 %   Basophils Relative 0.7 0.0 - 3.0 %   Neutro Abs 3.5 1.4 - 7.7 K/uL   Lymphs Abs 3.7 0.7 - 4.0 K/uL   Monocytes Absolute 0.5 0.1 - 1.0 K/uL   Eosinophils Absolute 0.2 0.0 - 0.7 K/uL   Basophils Absolute 0.1 0.0 - 0.1 K/uL  TSH  Result Value Ref Range   TSH 4.50 0.35 - 4.50 uIU/mL  Comprehensive metabolic panel  Result Value Ref Range   Sodium 142 135 -  145 mEq/L   Potassium 4.3 3.5 - 5.1 mEq/L   Chloride 107 96 - 112 mEq/L   CO2 28 19 - 32 mEq/L   Glucose, Bld 106 (H) 70 - 99 mg/dL   BUN 25 (H) 6 - 23 mg/dL   Creatinine, Ser 1.72 (H) 0.40 - 1.50 mg/dL   Total Bilirubin 0.5 0.2 - 1.2 mg/dL   Alkaline Phosphatase 56 39 - 117 U/L   AST 19 0 - 37 U/L   ALT 23 0 - 53 U/L   Total Protein 6.5 6.0 - 8.3 g/dL   Albumin 4.2 3.5 - 5.2 g/dL   GFR 36.80 (L) >60.00 mL/min   Calcium 8.8 8.4 - 10.5 mg/dL  Lipid panel  Result Value Ref Range   Cholesterol 136 0 - 200 mg/dL   Triglycerides 98.0 0.0 - 149.0 mg/dL   HDL 49.60 >39.00 mg/dL   VLDL 19.6 0.0 - 40.0 mg/dL   LDL Cholesterol 67 0 - 99 mg/dL   Total CHOL/HDL Ratio 3    NonHDL 86.49   IFE Interpretation  Result Value Ref Range  Immunofix Electr Int NO MONOCLONAL PROTEIN DETECTED    Assessment & Plan:  This visit occurred during the SARS-CoV-2 public health emergency.  Safety protocols were in place, including screening questions prior to the visit, additional usage of staff PPE, and extensive cleaning of exam room while observing appropriate contact time as indicated for disinfecting solutions.   Problem List Items Addressed This Visit    Left homonymous hemianopsia    L vision trouble noted today - encouraged return to eye doctor.       Advance directive discussed with patient    Advanced directives - doesn't have living will. Does not want prolonged life support if terminal condition - doesn't want to be "burden". Would want daughters to be HCPOA. Packet provided today.           No orders of the defined types were placed in this encounter.  No orders of the defined types were placed in this encounter.   Patient instructions: You had trouble with left eye vision today - touch base with eye doctor about this.  Let us know date of COVID booster  Consider pneumonia shot as you're due. Consider shingles shots.  Continue working on advanced directive/living will - bring [place] a  copy when completed.  Kidneys returned impaired - increase water intake. I recommend repeat testing in 3 months - schedule lab visit for this.  Advanced directive packet provided today  Return in 6 months for follow up visit.   Follow up plan: Return in about 6 months (around 05/21/2021), or if symptoms worsen or fail to improve, for follow up visit.  [name], MD

## 2020-11-21 NOTE — Assessment & Plan Note (Signed)
Chronic, stable. Continue amlodipine ,carvedilol, clonidine, and lisinopril.

## 2020-11-21 NOTE — Assessment & Plan Note (Signed)
H/o normocytic anemia - update CBC

## 2020-11-21 NOTE — Assessment & Plan Note (Signed)
Continue full dose atorvastatin 80mg  daily. Goal LDL <70 The ASCVD Risk score Mikey Bussing DC Jr., et al., 2013) failed to calculate for the following reasons:   The 2013 ASCVD risk score is only valid for ages 52 to 37

## 2020-11-21 NOTE — Assessment & Plan Note (Signed)
Continue regular colace.

## 2020-11-21 NOTE — Assessment & Plan Note (Signed)

## 2020-11-21 NOTE — Assessment & Plan Note (Signed)
Update CBC. 

## 2020-11-21 NOTE — Assessment & Plan Note (Signed)
Update renal panel. Check SPEP

## 2020-11-21 NOTE — Assessment & Plan Note (Signed)
Advanced directives - doesn't have living will. Does not want prolonged life support if terminal condition - doesn't want to be "burden". Would want daughters to be HCPOA. Packet provided today.  

## 2020-11-21 NOTE — Assessment & Plan Note (Signed)
Stable period s/p L CEA. Continue statin, plavix.

## 2020-11-21 NOTE — Assessment & Plan Note (Signed)
Update SPEP 

## 2020-12-01 DIAGNOSIS — D0439 Carcinoma in situ of skin of other parts of face: Secondary | ICD-10-CM | POA: Diagnosis not present

## 2021-01-06 ENCOUNTER — Other Ambulatory Visit: Payer: Self-pay | Admitting: Family Medicine

## 2021-01-07 NOTE — Telephone Encounter (Signed)
Spoke with pt asking if he resumed med.  States he has not.  Says [facility] called him asking did he want a refill on it, he was sure what it was for and said yes.  Now, he's realizing he doesn't use it anymore and apologizes for the confusion.  Request denied.

## 2021-02-07 ENCOUNTER — Other Ambulatory Visit: Payer: Self-pay | Admitting: Family Medicine

## 2021-02-16 ENCOUNTER — Other Ambulatory Visit: Payer: Self-pay | Admitting: Family Medicine

## 2021-02-16 DIAGNOSIS — N1832 Chronic kidney disease, stage 3b: Secondary | ICD-10-CM

## 2021-02-18 ENCOUNTER — Other Ambulatory Visit (INDEPENDENT_AMBULATORY_CARE_PROVIDER_SITE_OTHER): Payer: Medicare Other

## 2021-02-18 ENCOUNTER — Other Ambulatory Visit: Payer: Self-pay

## 2021-02-18 DIAGNOSIS — N1832 Chronic kidney disease, stage 3b: Secondary | ICD-10-CM

## 2021-02-18 LAB — RENAL FUNCTION PANEL
Albumin: 4 g/dL (ref 3.5–5.2)
BUN: 17 mg/dL (ref 6–23)
CO2: 26 mEq/L (ref 19–32)
Calcium: 9 mg/dL (ref 8.4–10.5)
Chloride: 105 mEq/L (ref 96–112)
Creatinine, Ser: 1.53 mg/dL — ABNORMAL HIGH (ref 0.40–1.50)
GFR: 42.27 mL/min — ABNORMAL LOW (ref >60.00)
Glucose, Bld: 97 mg/dL (ref 70–99)
Phosphorus: 3.4 mg/dL (ref 2.3–4.6)
Potassium: 4.8 mEq/L (ref 3.5–5.1)
Sodium: 140 mEq/L (ref 135–145)

## 2021-02-19 LAB — PARATHYROID HORMONE, INTACT (NO CA): PTH: 34 pg/mL (ref 16–77)

## 2021-02-25 ENCOUNTER — Other Ambulatory Visit: Payer: Self-pay

## 2021-02-25 ENCOUNTER — Encounter: Payer: Self-pay | Admitting: Internal Medicine

## 2021-02-25 ENCOUNTER — Ambulatory Visit (INDEPENDENT_AMBULATORY_CARE_PROVIDER_SITE_OTHER): Payer: Medicare Other | Admitting: Internal Medicine

## 2021-02-25 VITALS — BP 180/78 | HR 61 | Ht 64.0 in | Wt 198.0 lb

## 2021-02-25 DIAGNOSIS — I6523 Occlusion and stenosis of bilateral carotid arteries: Secondary | ICD-10-CM | POA: Diagnosis not present

## 2021-02-25 DIAGNOSIS — I739 Peripheral vascular disease, unspecified: Secondary | ICD-10-CM

## 2021-02-25 DIAGNOSIS — E785 Hyperlipidemia, unspecified: Secondary | ICD-10-CM | POA: Diagnosis not present

## 2021-02-25 DIAGNOSIS — I10 Essential (primary) hypertension: Secondary | ICD-10-CM

## 2021-02-25 DIAGNOSIS — Z79899 Other long term (current) drug therapy: Secondary | ICD-10-CM | POA: Diagnosis not present

## 2021-02-25 MED ORDER — LISINOPRIL 20 MG PO TABS
20.0000 mg | ORAL_TABLET | Freq: Every day | ORAL | 1 refills | Status: DC
Start: 2021-02-25 — End: 2021-05-06

## 2021-02-25 NOTE — Patient Instructions (Signed)
Medication Instructions:   Your physician has recommended you make the following change in your medication:   INCREASE Lisinopril 20 mg daily - A new Rx was sent to your pharmacy. You may double your current dose (2 tablets for total of 20 mg) until you fill new Rx.   *If you need a refill on your cardiac medications before your next appointment, please call your pharmacy*   Lab Work:  Your physician recommends that you return for lab work in: [place] (BMET) You do NOT have to be fasting for this lab.  -  Please go to the [facility]. You will check in at the front desk to the right as you walk into the atrium. Valet Parking is offered if needed. - No appointment needed. You may go any day between 7 am and 6 pm.   Testing/Procedures:  None ordered   Follow-Up: At [facility], you and your health needs are our priority.  As part of our continuing mission to provide you with exceptional heart care, we have created designated Provider Care Teams.  These Care Teams include your primary Cardiologist (physician) and Advanced Practice Providers (APPs -  Physician Assistants and Nurse Practitioners) who all work together to provide you with the care you need, when you need it.  We recommend signing up for the patient portal called "MyChart".  Sign up information is provided on this After Visit Summary.  MyChart is used to connect with patients for Virtual Visits (Telemedicine).  Patients are able to view lab/test results, encounter notes, upcoming appointments, etc.  Non-urgent messages can be sent to your provider as well.   To learn more about what you can do with MyChart, go to [email or web address].    Your next appointment:   6 week(s)  The format for your next appointment:   In Person  Provider:   You will see one of the following Advanced Practice Providers on your designated Care Team:   Murray Hodgkins, NP Christell Faith, PA-C Marrianne Mood, PA-C Cadence [place],  [place]

## 2021-02-25 NOTE — Progress Notes (Signed)
Follow-up Outpatient Visit Date: 02/25/2021  Primary Care Provider: [name], MD [place] [id number]   Primary Cardiologist: Esmond Plants, MD PhD  Chief Complaint: Follow-up claudication  HPI:  Mr. Craig Avery is a 80 y.o. male with history of carotid artery stenosis complicated by stroke status post left carotid endarterectomy, peripheral vascular disease with moderate bilateral lower extremity outflow disease, hypertension, hyperlipidemia, and obesity, who presents for follow-up of peripheral arterial disease.  I last saw him in February, at which time Mr. Cuppett reported stable bilateral claudication in his legs when walking 200 to 300 yards.  He was last seen in our office in March by Marrianne Mood, PA, at which time he reported stable exertional dyspnea.  He reported that his leg pain seem to come on after just taking a few steps.  ABIs in April were stable (normal ABIs but abnormal TBI's bilaterally).  Today, Mr. Franssen reports feeling about the same as at our prior visit.  He feels like he can still walk 200 to 300 yards before having to stop to rest due to pain and cramping in his legs.  He reports that he has been "lazy" since our last visit and not doing much activity.  He notes that his home blood pressures are typically elevated, though not as high as today's reading in the office.  He denies chest pain, shortness of breath, palpitations, lightheadedness, and edema.  He does not have any rest pain in his legs nor wounds/ulcers on either lower extremity.  --------------------------------------------------------------------------------------------------  Cardiovascular History & Procedures: Cardiovascular Problems: Peripheral vascular disease with claudication Stroke with carotid artery stenosis   Risk Factors: Peripheral vascular disease, hypertension, hyperlipidemia, male gender, obesity, and age greater than 87   Cath/PCI: None   CV  Surgery: Left carotid endarterectomy (07/21/2012, Dr. Trula Slade)   EP Procedures and Devices: None   Non-Invasive Evaluation(s): ABIs (10/27/2020): Normal ABIs bilaterally (0.99 on the right and 1.1 on the left).  TBI's mildly abnormal (0.55 on the right and 0.46 on the left).  No significant change from prior study. Carotid Doppler (09/08/2020): Mild bilateral ICA stenoses (less than 40%).  Greater than 50% right ECA stenosis present.  Antegrade vertebral artery flow.  Normal flow dynamics in both subclavian arteries. Carotid Doppler (02/05/2019): 1-39% stenosis in both ICAs.  Greater than 50% right ECA stenosis.  Question proximal vertebral artery stenosis.  Patent subclavian flow. ABIs (01/01/2019): 1.3 on the right, 0.94 on the left.  TBI 0.61 on the right, 0.39 on the left. BLE arterial Dopplers with ABIs (12/02/2017): 50 to 74% stenosis in the right CFA.  30 to 49% stenosis in the right SFA.  30 to 49% stenosis in the left popliteal artery.  ABIs are normal bilaterally.  TBI's are mildly abnormal bilaterally. AAA screen (09/30/2017): Normal sized abdominal aorta.  Bilateral iliac disease greater than 50%, extending to the proximal common femoral artery on the right. Exercise MPI (05/05/2017): Low risk study without ischemia.  Apical thinning versus attenuation artifact noted.  LVEF 67%. Carotid Doppler (02/28/2017): Right internal carotid artery with 1-39% stenosis.  Patent left carotid endarterectomy site without restenosis.  Right external carotid artery stenosis noted.  Overall, no change since 02/2016.  Recent CV Pertinent Labs: Lab Results  Component Value Date   CHOL 136 11/17/2020   CHOL 140 09/25/2012   HDL 49.60 11/17/2020   HDL 47 09/25/2012   LDLCALC 67 11/17/2020   LDLCALC 72 09/25/2012   LDLDIRECT 74.0 07/07/2016  TRIG 98.0 11/17/2020   CHOLHDL 3 11/17/2020   INR 0.91 07/14/2012   BNP 31.0 07/06/2019   K 4.8 02/18/2021   MG 1.8 07/20/2019   BUN 17 02/18/2021   BUN 19  10/27/2020   CREATININE 1.53 (H) 02/18/2021    Past medical and surgical history were reviewed and updated in EPIC.  Current Meds  Medication Sig   amLODipine (NORVASC) 10 MG tablet TAKE 1 TABLET BY MOUTH EVERY DAY   atorvastatin (LIPITOR) 80 MG tablet Take 1 tablet (80 mg total) by mouth daily.   carvedilol (COREG) 12.5 MG tablet Take 1 tablet (12.5 mg total) by mouth 2 (two) times daily.   cloNIDine (CATAPRES) 0.1 MG tablet Take 1 tablet (0.1 mg total) by mouth 2 (two) times daily as needed (for systolic BP greater than 0000000.).   clopidogrel (PLAVIX) 75 MG tablet TAKE 1 TABLET BY MOUTH EVERY DAY   Docusate Calcium (STOOL SOFTENER PO) Take by mouth daily.   lisinopril (ZESTRIL) 10 MG tablet Take 1 tablet (10 mg total) by mouth daily.   loratadine (CLARITIN) 10 MG tablet Take 10 mg by mouth daily.   Multiple Vitamins-Minerals (CENTRUM SILVER ADULT 50+ PO) Take 1 tablet by mouth daily.    Allergies: Patient has no known allergies.  Social History   Tobacco Use   Smoking status: Former    Packs/day: 1.50    Years: 33.00    Pack years: 49.50    Types: Cigarettes    Quit date: 06/18/1994    Years since quitting: 26.7   Smokeless tobacco: Never  Vaping Use   Vaping Use: Never used  Substance Use Topics   Alcohol use: Yes    Alcohol/week: 8.0 - 10.0 standard drinks    Types: 8 - 10 Cans of beer per week    Comment: weekly (beer)   Drug use: No    Family History  Problem Relation Age of Onset   CAD Brother 76       MI   Heart disease Brother        before age 45   Heart attack Brother    Hypertension Mother    Cirrhosis Father 52       EtOHic   Aneurysm Paternal Uncle 88   Heart disease Daughter    Hyperlipidemia Daughter     Review of Systems: A 12-system review of systems was performed and was negative except as noted in the HPI.  --------------------------------------------------------------------------------------------------  Physical Exam: BP (!) 180/78 (BP  Location: Left Arm, Patient Position: Sitting, Cuff Size: Large)   Pulse 61   Ht '5\' 4"'$  (1.626 m)   Wt 198 lb (89.8 kg)   SpO2 96%   BMI 33.99 kg/m   General:  NAD. Neck: No JVD or HJR. Lungs: Clear to auscultation bilaterally without wheezes or crackles. Heart: Regular rate and rhythm without murmurs, rubs, or gallops. Abdomen: Soft, nontender, nondistended. Extremities: No lower extremity edema.  Pedal pulses trace bilaterally.  EKG: Normal sinus rhythm with sinus arrhythmia and possible inferior infarct.  No significant change since 09/04/2020.  Lab Results  Component Value Date   WBC 8.0 11/17/2020   HGB 12.7 (L) 11/17/2020   HCT 36.8 (L) 11/17/2020   MCV 91.8 11/17/2020   PLT 269.0 11/17/2020    Lab Results  Component Value Date   NA 140 02/18/2021   K 4.8 02/18/2021   CL 105 02/18/2021   CO2 26 02/18/2021   BUN 17 02/18/2021   CREATININE 1.53 (H)  02/18/2021   GLUCOSE 97 02/18/2021   ALT 23 11/17/2020    Lab Results  Component Value Date   CHOL 136 11/17/2020   HDL 49.60 11/17/2020   LDLCALC 67 11/17/2020   LDLDIRECT 74.0 07/07/2016   TRIG 98.0 11/17/2020   CHOLHDL 3 11/17/2020    --------------------------------------------------------------------------------------------------  ASSESSMENT AND PLAN: Claudication and peripheral vascular disease: Mr. Destefano reports stable Rutherford class I claudication.  Vascular studies this April showed stable and normal ABIs and abnormal TBI's suggestive of small vessel disease.  Given that his symptoms have not progressed, we have agreed to defer invasive testing.  I encouraged Mr. Kulpa to try to increase his walking.  We will continue clopidogrel and atorvastatin for secondary prevention.  Carotid artery stenosis: No symptoms reported.  Stable mild bilateral ICA disease status post left CEA noted on most recent Doppler in 08/2020.  Continue follow-up with Dr. Trula Slade (vascular surgery).  Uncontrolled  hypertension: Blood pressure not well controlled today.  We have agreed to increase lisinopril to 20 mg daily and continue current doses of amlodipine and carvedilol as well as as needed clonidine for SBP greater than 160 mmHg.  In the setting of CKD, we will plan to repeat a BMP in 2 weeks and have Mr. Dirusso follow-up in the office in 6 weeks for blood pressure check.  Hyperlipidemia: LDL and triglycerides at goal on last check in May.  Continue atorvastatin 80 mg daily.  Follow-up: Return to clinic in 6 weeks for blood pressure check.  Nelva Bush, MD 02/25/2021 9:43 AM

## 2021-02-27 ENCOUNTER — Encounter: Payer: Self-pay | Admitting: Internal Medicine

## 2021-03-13 ENCOUNTER — Other Ambulatory Visit
Admission: RE | Admit: 2021-03-13 | Discharge: 2021-03-13 | Disposition: A | Discharge status: Home / Self Care | Payer: Medicare Other | Attending: Internal Medicine | Admitting: Internal Medicine

## 2021-03-13 DIAGNOSIS — I10 Essential (primary) hypertension: Secondary | ICD-10-CM | POA: Insufficient documentation

## 2021-03-13 DIAGNOSIS — Z79899 Other long term (current) drug therapy: Secondary | ICD-10-CM | POA: Insufficient documentation

## 2021-03-13 LAB — BASIC METABOLIC PANEL
Anion gap: 6 (ref 5–15)
BUN: 27 mg/dL — ABNORMAL HIGH (ref 8–23)
CO2: 27 mmol/L (ref 22–32)
Calcium: 8.9 mg/dL (ref 8.9–10.3)
Chloride: 108 mmol/L (ref 98–111)
Creatinine, Ser: 1.57 mg/dL — ABNORMAL HIGH (ref 0.61–1.24)
GFR, Estimated: 44 mL/min — ABNORMAL LOW (ref >60)
Glucose, Bld: 115 mg/dL — ABNORMAL HIGH (ref 70–99)
Potassium: 4.8 mmol/L (ref 3.5–5.1)
Sodium: 141 mmol/L (ref 135–145)

## 2021-03-19 ENCOUNTER — Encounter: Payer: Self-pay | Admitting: Family Medicine

## 2021-03-19 ENCOUNTER — Other Ambulatory Visit: Payer: Self-pay | Admitting: Internal Medicine

## 2021-03-23 ENCOUNTER — Other Ambulatory Visit: Payer: Self-pay | Admitting: *Deleted

## 2021-03-23 MED ORDER — ATORVASTATIN CALCIUM 80 MG PO TABS: 80.0000 mg | ORAL_TABLET | Freq: Every day | ORAL | 0 refills | Status: DC

## 2021-04-07 NOTE — Progress Notes (Signed)
Cardiology Office Note:    Date:  04/09/2021   ID:  Craig Avery, DOB 01/03/1941, MRN [id number]  PCP:  [name], MD  [facility] Cardiologist:  Ida Rogue, MD  [facility] Electrophysiologist:  None   Referring MD: [name], MD   Chief Complaint: 6 week follow-up  History of Present Illness:    Craig Avery is a 80 y.o. male with a hx of carotid artery stenosis complicated by stroke s/p left carotid endarterectomy, PVD with moderate b/l lower extremity outflow disease, HTN, HLD, and obesity who presents for follow-up.   He has history of COVID-19 infection in 2021.  He states this occurred at Christmas over a year ago.  He reports gradually improving exertional dyspnea since that time.  He has had stable claudication with walking greater than 200 yards.  There has been no evidence of critical limb ischemia, however.  ASA was discontinued, and he was continued on Plavix.  Plan was for repeat ABIs in 6 months.  BP was suboptimal and lisinopril increased to 10 mg daily.    Last seen 02/25/21 reporting DOE and leg cramping. Vascular studies April 2022 showed stable and normal ABIs and abnormal TBIs suggestive of small vessel disease. Invasive testing deferred at that time. He was continued on plavix and statin.   Today, the patient reports he has been doing the same since the last visit. He has pain in his legs only when he walks, can walk 100-200yds and stops and a couple minutes it goes away. He has not increased his walking. No chest pain or shortness of breath. No LLE, orthopnea pnd. BP elevated. He is not sure how he is taking the clonidine.BP at home is generally high.   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   from CVA -  completely resolved as of 08/2012 ophtho eval ([place])   Obesity    Pneumonia due to COVID-19 virus 06/2019   s/p hospitalization with PNA   Stroke ([place]) 05/2012   R PCA territory, presented with L paresthesias    Past Surgical History:  Procedure Laterality Date   CAROTID ENDARTERECTOMY Left 07-21-12   cea   CATARACT EXTRACTION  2004   bilateral   COLONOSCOPY WITH PROPOFOL N/A 06/24/2015   3 polyps, one with high grade dysplasia, rpt 3 mo; [name], MD   COLONOSCOPY WITH PROPOFOL N/A 10/14/2015   hyperplastic polyp; f/u PRN; [name], MD   ENDARTERECTOMY  07/21/2012   Left CEA; Surgeon: [name], MD   EYE SURGERY     MELANOMA EXCISION  12/30/2016   right side of head above ear   PATCH ANGIOPLASTY  07/21/2012   Procedure: PATCH ANGIOPLASTY;  Surgeon: [name], MD;  Location: [facility];  Service: Vascular;  Laterality: Left;  Using 1 cm x 6 cm Vascu Guard patch    TEE WITHOUT CARDIOVERSION  05/22/2012   Procedure: TRANSESOPHAGEAL ECHOCARDIOGRAM (TEE);  Surgeon: [name], MD;  Location: [facility] END[facility];  Service: Cardiovascular;  Laterality: N/A;   WISDOM TOOTH EXTRACTION      Current Medications: Current Meds  Medication Sig   amLODipine (NORVASC) 10 MG tablet TAKE 1 TABLET BY MOUTH EVERY DAY   atorvastatin (LIPITOR) 80 MG tablet  Take 1 tablet (80 mg total) by mouth daily.   carvedilol (COREG) 12.5 MG tablet Take 1 tablet (12.5 mg total) by mouth 2 (two) times daily.   cloNIDine (CATAPRES) 0.1 MG tablet Take 1 tablet (0.1 mg total) by mouth 2 (two) times daily as needed (for systolic BP greater than 371.).   clopidogrel (PLAVIX) 75 MG tablet TAKE 1 TABLET BY MOUTH EVERY DAY   Docusate Calcium (STOOL SOFTENER PO) Take by mouth daily.   lisinopril (ZESTRIL) 20 MG tablet Take 1 tablet (20 mg total) by mouth daily.   loratadine (CLARITIN) 10 MG tablet Take 10 mg by mouth daily.   Multiple Vitamins-Minerals (CENTRUM SILVER ADULT 50+ PO) Take 1 tablet by mouth daily.      Allergies:   Patient has no known allergies.   Social History   Socioeconomic History   Marital status: Married    Spouse name: Not on file   Number of children: Not on file   Years of education: Not on file   Highest education level: Not on file  Occupational History   Not on file  Tobacco Use   Smoking status: Former    Packs/day: 1.50    Years: 33.00    Pack years: 49.50    Types: Cigarettes    Quit date: 06/18/1994    Years since quitting: 26.8   Smokeless tobacco: Never  Vaping Use   Vaping Use: Never used  Substance and Sexual Activity   Alcohol use: Yes    Alcohol/week: 8.0 - 10.0 standard drinks    Types: 8 - 10 Cans of beer per week    Comment: weekly (beer)   Drug use: No   Sexual activity: Yes  Other Topics Concern   Not on file  Social History Narrative   Caffeine: light coffee in am, working on cutting back   Lives alone. Widower (08/2017). Daughter next door. Other children nearby.   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 Would want wife and daughters to be HCPOA. Requests info today.   Social Determinants of Health   Financial Resource Strain: Not on file  Food Insecurity: Not on file  Transportation Needs: Not on file  Physical Activity: Not on file  Stress: Not on file  Social Connections: Not on file     Family History: The patient's family history includes Aneurysm (age of onset: 65) in his paternal uncle; CAD (age of onset: 43) in his brother; Cirrhosis (age of onset: 75) in his father; Heart attack in his brother; Heart disease in his brother and daughter; Hyperlipidemia in his daughter; Hypertension in his mother.  ROS:   Please see the history of present illness.     All other systems reviewed and are  negative.  EKGs/Labs/Other Studies Reviewed:    The following studies were reviewed today:  Myoview 04/2020 Narrative & Impression  Exercise myocardial perfusion imaging study with no significant  Ischemia Small region of apical thinning noted, likely attenuation artifact Normal wall motion, EF estimated at 67% No EKG changes concerning for ischemia at peak stress or in recovery. Low risk scan     EKG:  EKG is  ordered today.  The ekg ordered today demonstrates SB, 53bpm, no ST/T wave changes  Recent Labs: 11/17/2020: ALT 23; Hemoglobin 12.7; Platelets 269.0; TSH 4.50 03/13/2021: BUN 27; Creatinine, Ser 1.57; Potassium 4.8; Sodium 141  Recent Lipid Panel    Component Value Date/Time   CHOL 136 11/17/2020 1353   CHOL 140 09/25/2012 0805   TRIG 98.0 11/17/2020 1353   HDL 49.60 11/17/2020 1353   HDL 47 09/25/2012 0805   CHOLHDL 3 11/17/2020 1353   VLDL 19.6 11/17/2020 1353   LDLCALC 67 11/17/2020 1353   LDLCALC 72 09/25/2012 0805   LDLDIRECT 74.0 07/07/2016 1004   Physical Exam:    VS:  BP (!) 160/70 (BP Location: Left Arm, Patient Position: Sitting, Cuff Size: Normal)   Pulse (!) 53   Ht 5\' 4"  (1.626 m)   Wt 197 lb 8 oz (89.6 kg)   SpO2 97%   BMI 33.90 kg/m     Wt Readings from Last 3 Encounters:  04/09/21 197 lb 8 oz (89.6 kg)  02/25/21 198 lb (89.8 kg)  11/18/20 193 lb 6 oz (87.7 kg)     GEN:  Well nourished, well developed in no acute distress HEENT: Normal NECK: No JVD; No carotid bruits LYMPHATICS: No lymphadenopathy CARDIAC: RRR, no murmurs, rubs, gallops RESPIRATORY:  Clear to auscultation without rales, wheezing or rhonchi  ABDOMEN: Soft, non-tender, non-distended MUSCULOSKELETAL:  No edema; No deformity  SKIN: Warm and dry NEUROLOGIC:  Alert and oriented x 3 PSYCHIATRIC:  Normal affect   ASSESSMENT:    1. Claudication in peripheral vascular disease ([place])   2. Hyperlipidemia LDL goal <70   3. PAD (peripheral artery disease) ([place])   4. Essential  hypertension   5. Carotid stenosis, bilateral    PLAN:    In order of problems listed above:  Claudication and PVD Patient reports he has stable claudication symptoms. Prior normal AIS  and abnormal TBIs, suspected small vessel disease. He has not increased his walking, it was encouraged he do so. Given symptoms are stable no plan for invasive testing at this time. Continue Plavix and statin.  Carotid artery stenosis Stable mild b/l ICA s/p left CEA on most recent doppler 08/2020. He follows with [facility].  HTN BP elevated today. Unsure if he is taking clonidine daily or PRN, encouraged to take as needed. Given CKD do not want to increase lisinopril. Cannot increase coreg with heart rate of 53bpm. I will start hydralazine 25mg  TID. Recommend daily Bps. Continue lisinopril and coreg.   HLD LDL 67 11/2020. Continue Atorvastatin 80mg  daily.   Disposition: Follow up in 4-6 week(s) with MD/APP    Signed, Challen Spainhour Ninfa Meeker, PA-C  04/09/2021 11:00 AM    [facility]

## 2021-04-08 ENCOUNTER — Ambulatory Visit: Payer: Medicare Other | Admitting: Medical

## 2021-04-08 DIAGNOSIS — D225 Melanocytic nevi of trunk: Secondary | ICD-10-CM | POA: Diagnosis not present

## 2021-04-08 DIAGNOSIS — L570 Actinic keratosis: Secondary | ICD-10-CM | POA: Diagnosis not present

## 2021-04-08 DIAGNOSIS — D2272 Melanocytic nevi of left lower limb, including hip: Secondary | ICD-10-CM | POA: Diagnosis not present

## 2021-04-08 DIAGNOSIS — L821 Other seborrheic keratosis: Secondary | ICD-10-CM | POA: Diagnosis not present

## 2021-04-08 DIAGNOSIS — D2262 Melanocytic nevi of left upper limb, including shoulder: Secondary | ICD-10-CM | POA: Diagnosis not present

## 2021-04-08 DIAGNOSIS — D2271 Melanocytic nevi of right lower limb, including hip: Secondary | ICD-10-CM | POA: Diagnosis not present

## 2021-04-08 DIAGNOSIS — D2261 Melanocytic nevi of right upper limb, including shoulder: Secondary | ICD-10-CM | POA: Diagnosis not present

## 2021-04-08 DIAGNOSIS — X32XXXA Exposure to sunlight, initial encounter: Secondary | ICD-10-CM | POA: Diagnosis not present

## 2021-04-09 ENCOUNTER — Ambulatory Visit (INDEPENDENT_AMBULATORY_CARE_PROVIDER_SITE_OTHER): Payer: Medicare Other | Admitting: Medical

## 2021-04-09 ENCOUNTER — Other Ambulatory Visit: Payer: Self-pay

## 2021-04-09 ENCOUNTER — Encounter: Payer: Self-pay | Admitting: Medical

## 2021-04-09 VITALS — BP 160/70 | HR 53 | Ht 64.0 in | Wt 197.5 lb

## 2021-04-09 DIAGNOSIS — I6523 Occlusion and stenosis of bilateral carotid arteries: Secondary | ICD-10-CM

## 2021-04-09 DIAGNOSIS — I10 Essential (primary) hypertension: Secondary | ICD-10-CM | POA: Diagnosis not present

## 2021-04-09 DIAGNOSIS — E785 Hyperlipidemia, unspecified: Secondary | ICD-10-CM | POA: Diagnosis not present

## 2021-04-09 DIAGNOSIS — I739 Peripheral vascular disease, unspecified: Secondary | ICD-10-CM | POA: Diagnosis not present

## 2021-04-09 MED ORDER — HYDRALAZINE HCL 25 MG PO TABS: 25.0000 mg | ORAL_TABLET | Freq: Three times a day (TID) | ORAL | 3 refills | Status: DC

## 2021-04-09 NOTE — Patient Instructions (Signed)
Medication Instructions: Start Hydralazine 25 mg 3 times a day   *If you need a refill on your cardiac medications before your next appointment, please call your pharmacy*   Lab Work: None ordered   If you have labs (blood work) drawn today and your tests are completely normal, you will receive your results only by: [place] (if you have MyChart) OR A paper copy in the mail If you have any lab test that is abnormal or we need to change your treatment, we will call you to review the results.   Testing/Procedures: None ordered    Follow-Up: At [facility], you and your health needs are our priority.  As part of our continuing mission to provide you with exceptional heart care, we have created designated Provider Care Teams.  These Care Teams include your primary Cardiologist (physician) and Advanced Practice Providers (APPs -  Physician Assistants and Nurse Practitioners) who all work together to provide you with the care you need, when you need it.  We recommend signing up for the patient portal called "MyChart".  Sign up information is provided on this After Visit Summary.  MyChart is used to connect with patients for Virtual Visits (Telemedicine).  Patients are able to view lab/test results, encounter notes, upcoming appointments, etc.  Non-urgent messages can be sent to your provider as well.   To learn more about what you can do with MyChart, go to [email or web address].    Your next appointment:   4 week(s)  The format for your next appointment:   In Person  Provider:   You may see Ida Rogue, MD or one of the following Advanced Practice Providers on your designated Care Team:   Murray Hodgkins, NP Christell Faith, PA-C Marrianne Mood, PA-C Cadence Kathlen Mody, [place]   Other Instructions Check your blood pressure 2 hours after you take your medications

## 2021-05-05 ENCOUNTER — Other Ambulatory Visit: Payer: Self-pay | Admitting: Family Medicine

## 2021-05-05 NOTE — Telephone Encounter (Signed)
Refill request Plavix Last refill 05/15/20 #90/3 Last office visit 12/19/20 Upcoming appointment 05/22/21

## 2021-05-06 ENCOUNTER — Other Ambulatory Visit: Payer: Self-pay

## 2021-05-06 ENCOUNTER — Encounter: Payer: Self-pay | Admitting: Cardiovascular Disease

## 2021-05-06 ENCOUNTER — Ambulatory Visit (INDEPENDENT_AMBULATORY_CARE_PROVIDER_SITE_OTHER): Payer: Medicare Other | Admitting: Cardiovascular Disease

## 2021-05-06 VITALS — BP 158/60 | HR 58 | Ht 64.0 in | Wt 201.0 lb

## 2021-05-06 DIAGNOSIS — I6523 Occlusion and stenosis of bilateral carotid arteries: Secondary | ICD-10-CM | POA: Diagnosis not present

## 2021-05-06 DIAGNOSIS — N1832 Chronic kidney disease, stage 3b: Secondary | ICD-10-CM | POA: Diagnosis not present

## 2021-05-06 DIAGNOSIS — I10 Essential (primary) hypertension: Secondary | ICD-10-CM | POA: Diagnosis not present

## 2021-05-06 DIAGNOSIS — E785 Hyperlipidemia, unspecified: Secondary | ICD-10-CM | POA: Diagnosis not present

## 2021-05-06 DIAGNOSIS — I739 Peripheral vascular disease, unspecified: Secondary | ICD-10-CM | POA: Diagnosis not present

## 2021-05-06 MED ORDER — ATORVASTATIN CALCIUM 80 MG PO TABS: 80.0000 mg | ORAL_TABLET | Freq: Every day | ORAL | 3 refills | Status: DC

## 2021-05-06 MED ORDER — AMLODIPINE BESYLATE 10 MG PO TABS: 10.0000 mg | ORAL_TABLET | Freq: Every day | ORAL | 3 refills | Status: DC

## 2021-05-06 MED ORDER — HYDRALAZINE HCL 25 MG PO TABS: 25.0000 mg | ORAL_TABLET | Freq: Three times a day (TID) | ORAL | 3 refills | Status: DC | PRN

## 2021-05-06 MED ORDER — LISINOPRIL 20 MG PO TABS: 20.0000 mg | ORAL_TABLET | Freq: Every day | ORAL | 3 refills | Status: DC

## 2021-05-06 MED ORDER — CARVEDILOL 12.5 MG PO TABS: 12.5000 mg | ORAL_TABLET | Freq: Two times a day (BID) | ORAL | 3 refills | Status: DC

## 2021-05-06 MED ORDER — CLONIDINE HCL 0.1 MG PO TABS: 0.1000 mg | ORAL_TABLET | Freq: Two times a day (BID) | ORAL | 3 refills | Status: DC

## 2021-05-06 NOTE — Patient Instructions (Addendum)
Monitor heart rate and blood pressure at home  Medication Instructions:  Please START clonidine 0.1 mg twice a day Stay on: Amlodipine in the morning Lisinopril in the PM Coreg twice a day  Change  hydralazine 25  AS NEEDED  Up to three times a day for blood pressure >160  If you need a refill on your cardiac medications before your next appointment, please call your pharmacy.   Lab work: No new labs needed  Testing/Procedures: No new testing needed  Follow-Up: At [facility], you and your health needs are our priority.  As part of our continuing mission to provide you with exceptional heart care, we have created designated Provider Care Teams.  These Care Teams include your primary Cardiologist (physician) and Advanced Practice Providers (APPs -  Physician Assistants and Nurse Practitioners) who all work together to provide you with the care you need, when you need it.  You will need a follow up appointment in 6 months  Providers on your designated Care Team:   Murray Hodgkins, NP Christell Faith, PA-C Cadence Kathlen [place], [place]  COVID-19 Vaccine Information can be found at: [email or web address].uk For questions related to vaccine distribution or appointments, please email vaccine@[email or web address] or call [phone].

## 2021-05-06 NOTE — Progress Notes (Signed)
Patient ID: Craig Avery, male   DOB: [birth year], 80 y.o.   MRN: [id number] Cardiology Office Note  Date:  05/06/2021   ID:  Craig Avery, DOB 16-Feb-1941, MRN [id number]  PCP:  [name], MD   Chief Complaint  Patient presents with   PHQ-9 4 Week Follow-up    Patient c/o bilateral leg pain when walking; does take a few minutes to relieve once he stops walking. Medications reviewed by the patient verbally.     HPI:  80 year-old gentleman with history of hypertension, long history of poorly controlled peripheral vascular disease, Carotid endarterectomy on the left July 2014, smoking history , 20 to 30 yr recent hospitalization x2 at [facility], L sided paresthesias, Found to have stroke (acute right PCA distribution ischemic infarction involving the right occipital region and right thalamus along with severe R PCA), returned a few days later to ER hypertensive to >196 systolic,  With medication management for blood pressure Chronic renal failure creatinine 1.5 He presents for follow-up of his PAD, hypertension  In follow-up today, not really checking blood pressure at home Not taking hydralazine in middle of day,  Otherwise reports that he feels well, No regular exercise program , " sitting too much" Weight stable but running high  Denies shortness of breath or chest pain on exertion Used to work in his workshop, not doing much in there at this time, " lazy" Daughters help take care of him Chronic back pain  Imaging studies reviewed Velocities in the left ICA are consistent with a 1-39%  Resting left ankle-brachial index is within normal range.  Lab work reviewed HBA1C 5.6  Other past medical history reviewed LE arterial Right: 50-74% stenosis noted in the common femoral artery. 30-49% stenosis noted in the superficial femoral artery. Left: 30-49% stenosis noted in the popliteal artery.  Initial MRI/MRA: 1. Small acute infarcts in the right hippocampus and right  occipital lobe, implicating right PCA territory ischemia. No mass effect or hemorrhage.   2. Chronic small vessel ischemia in the left basal ganglia and possibly also the medial left thalamus.   1. High-grade stenosis or focal near occlusion of the right PCA distal P2 segment with attenuated more distal right PCA flow.   2. Moderate to severe focal stenosis of the left PCA P2 segment.   3. 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   4. Mild to moderate anterior circulation atherosclerosis, most pronounced in the MCA M1 segments.   PMH:   has a past medical history of Carotid stenosis (01/2013), HCAP (healthcare-associated pneumonia) (07/21/2019), History of chicken pox, HLD (hyperlipidemia), HTN (hypertension), Left homonymous hemianopsia (05/2012), Obesity, Pneumonia due to COVID-19 virus (06/2019), and Stroke ([place]) (05/2012).  PSH:    Past Surgical History:  Procedure Laterality Date   CAROTID ENDARTERECTOMY Left 07-21-12   cea   CATARACT EXTRACTION  2004   bilateral   COLONOSCOPY WITH PROPOFOL N/A 06/24/2015   3 polyps, one with high grade dysplasia, rpt 3 mo; [name], MD   COLONOSCOPY WITH PROPOFOL N/A 10/14/2015   hyperplastic polyp; f/u PRN; [name], MD   ENDARTERECTOMY  07/21/2012   Left CEA; Surgeon: [name], MD   EYE SURGERY     MELANOMA EXCISION  12/30/2016   right side of head above ear   PATCH ANGIOPLASTY  07/21/2012   Procedure: PATCH ANGIOPLASTY;  Surgeon: [name], MD;  Location: [facility];  Service: Vascular;  Laterality: Left;  Using 1 cm x 6  cm Vascu Guard patch    TEE WITHOUT CARDIOVERSION  05/22/2012   Procedure: TRANSESOPHAGEAL ECHOCARDIOGRAM (TEE);  Surgeon: [name], MD;  Location: [facility] ENDOSCOPY;  Service: Cardiovascular;  Laterality: N/A;   WISDOM TOOTH EXTRACTION      Current Outpatient Medications  Medication Sig Dispense Refill   clopidogrel (PLAVIX) 75 MG tablet TAKE 1 TABLET BY MOUTH EVERY DAY 90 tablet 3   Docusate  Calcium (STOOL SOFTENER PO) Take by mouth daily.     loratadine (CLARITIN) 10 MG tablet Take 10 mg by mouth daily.     Multiple Vitamins-Minerals (CENTRUM SILVER ADULT 50+ PO) Take 1 tablet by mouth daily.     amLODipine (NORVASC) 10 MG tablet Take 1 tablet (10 mg total) by mouth daily. 90 tablet 3   atorvastatin (LIPITOR) 80 MG tablet Take 1 tablet (80 mg total) by mouth daily. 90 tablet 3   carvedilol (COREG) 12.5 MG tablet Take 1 tablet (12.5 mg total) by mouth 2 (two) times daily. 180 tablet 3   cloNIDine (CATAPRES) 0.1 MG tablet Take 1 tablet (0.1 mg total) by mouth 2 (two) times daily. 180 tablet 3   hydrALAZINE (APRESOLINE) 25 MG tablet Take 1 tablet (25 mg total) by mouth 3 (three) times daily as needed. Take for SBP >160 180 tablet 3   lisinopril (ZESTRIL) 20 MG tablet Take 1 tablet (20 mg total) by mouth daily. 30 tablet 3   No current facility-administered medications for this visit.     Allergies:   Patient has no known allergies.   Social History:  The patient  reports that he quit smoking about 26 years ago. His smoking use included cigarettes. He has a 49.50 pack-year smoking history. He has never used smokeless tobacco. He reports current alcohol use of about 8.0 - 10.0 standard drinks per week. He reports that he does not use drugs.   Family History:   family history includes Aneurysm (age of onset: 67) in his paternal uncle; CAD (age of onset: 35) in his brother; Cirrhosis (age of onset: 28) in his father; Heart attack in his brother; Heart disease in his brother and daughter; Hyperlipidemia in his daughter; Hypertension in his mother.    Review of Systems: Review of Systems  Constitutional: Negative.   Respiratory: Negative.    Cardiovascular: Negative.   Gastrointestinal: Negative.   Musculoskeletal: Negative.   Neurological: Negative.   Psychiatric/Behavioral: Negative.    All other systems reviewed and are negative.   PHYSICAL EXAM: VS:  BP (!) 158/60 (BP  Location: Left Arm, Patient Position: Sitting, Cuff Size: Normal)   Pulse (!) 58   Ht 5\' 4"  (1.626 m)   Wt 201 lb (91.2 kg)   SpO2 98%   BMI 34.50 kg/m  , BMI Body mass index is 34.5 kg/m. Repeat blood pressure 140/70 Constitutional:  oriented to person, place, and time. No distress.  HENT:  Head: Grossly normal Eyes:  no discharge. No scleral icterus.  Neck: No JVD, no carotid bruits  Cardiovascular: Regular rate and rhythm, no murmurs appreciated Pulmonary/Chest: Clear to auscultation bilaterally, no wheezes or rails Abdominal: Soft.  no distension.  no tenderness.  Musculoskeletal: Normal range of motion Neurological:  normal muscle tone. Coordination normal. No atrophy Skin: Skin warm and dry Psychiatric: normal affect, pleasant   Recent Labs: 11/17/2020: ALT 23; Hemoglobin 12.7; Platelets 269.0; TSH 4.50 03/13/2021: BUN 27; Creatinine, Ser 1.57; Potassium 4.8; Sodium 141    Lipid Panel Lab Results  Component Value Date  CHOL 136 11/17/2020   HDL 49.60 11/17/2020   LDLCALC 67 11/17/2020   TRIG 98.0 11/17/2020      Wt Readings from Last 3 Encounters:  05/06/21 201 lb (91.2 kg)  04/09/21 197 lb 8 oz (89.6 kg)  02/25/21 198 lb (89.8 kg)      ASSESSMENT AND PLAN:  Essential hypertension -  he is not taking hydralazine middle of the day Appears blood pressure poorly controlled Wife puts medications in dispenser for a.m. and p.m. Currently on clonidine as needed but does not check his blood pressure so does not take it We have recommended he start clonidine 0.1 twice daily, change hydralazine to as needed.  We will need to monitor pulse rate given potential for bradycardia with clonidine.  For bradycardia could cut back slightly on the carvedilol none  Hyperlipidemia Prior history of myalgias, reports symptoms stable Cholesterol at goal, no medication changes made, diet and exercise recommended  Carotid stenosis, bilateral Ultrasound results reviewed with him in  detail.  Stable mild disease bilaterally, followed by vascular  CKD (chronic kidney disease) stage 3, GFR 30-59 ml/min Stable renal function, creatinine 1.57 On lisinopril Avoid NSAIDs    Total encounter time more than 25 minutes  Greater than 50% was spent in counseling and coordination of care with the patient    Orders Placed This Encounter  Procedures   EKG 12-Lead      Signed, Esmond Plants, M.D., Ph.D. 05/06/2021  [place], [address]

## 2021-05-22 ENCOUNTER — Ambulatory Visit (INDEPENDENT_AMBULATORY_CARE_PROVIDER_SITE_OTHER): Payer: Medicare Other | Admitting: Family Medicine

## 2021-05-22 ENCOUNTER — Other Ambulatory Visit: Payer: Self-pay

## 2021-05-22 ENCOUNTER — Encounter: Payer: Self-pay | Admitting: Family Medicine

## 2021-05-22 VITALS — BP 120/60 | HR 66 | Temp 97.9°F | Ht 63.0 in | Wt 202.6 lb

## 2021-05-22 DIAGNOSIS — Z23 Encounter for immunization: Secondary | ICD-10-CM | POA: Diagnosis not present

## 2021-05-22 DIAGNOSIS — I6523 Occlusion and stenosis of bilateral carotid arteries: Secondary | ICD-10-CM | POA: Diagnosis not present

## 2021-05-22 DIAGNOSIS — I10 Essential (primary) hypertension: Secondary | ICD-10-CM | POA: Diagnosis not present

## 2021-05-22 NOTE — Progress Notes (Signed)
Patient ID: MANU RUBEY, male    DOB: 1940-10-18, 80 y.o.   MRN: [id number]  This visit was conducted in person.  BP 120/60 Comment: home readings  Pulse 66   Temp 97.9 F (36.6 C) (Temporal)   Ht 5\' 3"  (1.6 m)   Wt 202 lb 9 oz (91.9 kg)   SpO2 96%   BMI 35.88 kg/m   Home readings 120s/50-60s  CC: 6 mo f/u visit  Subjective:   HPI: Craig Avery is a 80 y.o. male presenting on 05/22/2021 for Follow-up (Here for 6 mo f/u.)   HTN - Compliant with current antihypertensive regimen of amlodipine 10mg  daily, carvedilol 12.5mg  bid, clonidine 0.1mg  BID, lisinopril 20mg  daily, and hydralazine 25mg  TID PRN SBP >160 - hasn't needed to take this.  He didn't take meds yet this morning - will take when he gets home. Does check blood pressures at home: 120s - much better controlled on current regimen. No low blood pressure readings or symptoms of dizziness/syncope. Denies HA, vision changes, CP/tightness, SOB, leg swelling.  Daughter manages meds - fills 3 pill boxes for 3 weeks.   He's been feeling well.   PVD with claudication - continues plavix and atorvastatin for secondary prevention. Regularly sees Dr End cardiology and Dr Trula Slade [facility].      Relevant past medical, surgical, family and social history reviewed and updated as indicated. Interim medical history since our last visit reviewed. Allergies and medications reviewed and updated. Outpatient Medications Prior to Visit  Medication Sig Dispense Refill   amLODipine (NORVASC) 10 MG tablet Take 1 tablet (10 mg total) by mouth daily. 90 tablet 3   atorvastatin (LIPITOR) 80 MG tablet Take 1 tablet (80 mg total) by mouth daily. 90 tablet 3   carvedilol (COREG) 12.5 MG tablet Take 1 tablet (12.5 mg total) by mouth 2 (two) times daily. 180 tablet 3   cloNIDine (CATAPRES) 0.1 MG tablet Take 1 tablet (0.1 mg total) by mouth 2 (two) times daily. 180 tablet 3   clopidogrel (PLAVIX) 75 MG tablet TAKE 1 TABLET BY MOUTH EVERY DAY 90 tablet 3    Docusate Calcium (STOOL SOFTENER PO) Take by mouth daily.     hydrALAZINE (APRESOLINE) 25 MG tablet Take 1 tablet (25 mg total) by mouth 3 (three) times daily as needed. Take for SBP >160 180 tablet 3   lisinopril (ZESTRIL) 20 MG tablet Take 1 tablet (20 mg total) by mouth daily. 30 tablet 3   loratadine (CLARITIN) 10 MG tablet Take 10 mg by mouth daily.     Multiple Vitamins-Minerals (CENTRUM SILVER ADULT 50+ PO) Take 1 tablet by mouth daily.     No facility-administered medications prior to visit.     Per HPI unless specifically indicated in ROS section below Review of Systems  Objective:  BP 120/60 Comment: home readings  Pulse 66   Temp 97.9 F (36.6 C) (Temporal)   Ht 5\' 3"  (1.6 m)   Wt 202 lb 9 oz (91.9 kg)   SpO2 96%   BMI 35.88 kg/m   Wt Readings from Last 3 Encounters:  05/22/21 202 lb 9 oz (91.9 kg)  05/06/21 201 lb (91.2 kg)  04/09/21 197 lb 8 oz (89.6 kg)      Physical Exam Vitals and nursing note reviewed.  Constitutional:      Appearance: Normal appearance. He is not ill-appearing.  Eyes:     Extraocular Movements: Extraocular movements intact.     Pupils: Pupils are equal,  round, and reactive to light.  Neck:     Vascular: Carotid bruit (R sided) present.  Cardiovascular:     Rate and Rhythm: Normal rate and regular rhythm.     Pulses: Normal pulses.     Heart sounds: Normal heart sounds. No murmur heard. Pulmonary:     Effort: Pulmonary effort is normal. No respiratory distress.     Breath sounds: Normal breath sounds. No wheezing, rhonchi or rales.  Musculoskeletal:     Cervical back: Normal range of motion and neck supple.     Right lower leg: No edema.     Left lower leg: No edema.  Neurological:     Mental Status: He is alert.  Psychiatric:        Mood and Affect: Mood normal.        Behavior: Behavior normal.      Results for orders placed or performed during the hospital encounter of 16/96/78  Basic Metabolic Panel (BMET)  Result Value  Ref Range   Sodium 141 135 - 145 mmol/L   Potassium 4.8 3.5 - 5.1 mmol/L   Chloride 108 98 - 111 mmol/L   CO2 27 22 - 32 mmol/L   Glucose, Bld 115 (H) 70 - 99 mg/dL   BUN 27 (H) 8 - 23 mg/dL   Creatinine, Ser 1.57 (H) 0.61 - 1.24 mg/dL   Calcium 8.9 8.9 - 10.3 mg/dL   GFR, Estimated 44 (L) >60 mL/min   Anion gap 6 5 - 15    Assessment & Plan:  This visit occurred during the SARS-CoV-2 public health emergency.  Safety protocols were in place, including screening questions prior to the visit, additional usage of staff PPE, and extensive cleaning of exam room while observing appropriate contact time as indicated for disinfecting solutions.   Problem List Items Addressed This Visit     HTN (hypertension) - Primary (Chronic)    Chronic, stable.  Home readings largely well controlled, diastolics somewhat low - will continue to monitor.  BP elevated in office however he hasn't taken AM meds yet this morning - will go home and take this.       Carotid stenosis, bilateral    R bruit heard today - anticipate from R ECA stenosis as ICA 1-39% stenosed on latest carotid US (08/2020). Regularly sees [facility].       Other Visit Diagnoses     Need for influenza vaccination       Relevant Orders   Flu Vaccine QUAD High Dose(Fluad) (Completed)        No orders of the defined types were placed in this encounter.  Orders Placed This Encounter  Procedures   Flu Vaccine QUAD High Dose(Fluad)     Patient Instructions  Flu shot today  BP elevated in office today however you haven't taken your medicines this morning yet.  Return as needed or in 6 months for medicare wellness visit   Follow up plan: Return in about 6 months (around 11/19/2021) for medicare wellness visit.  [name], MD

## 2021-05-22 NOTE — Patient Instructions (Addendum)
Flu shot today  BP elevated in office today however you haven't taken your medicines this morning yet.  Return as needed or in 6 months for medicare wellness visit

## 2021-05-22 NOTE — Assessment & Plan Note (Signed)
R bruit heard today - anticipate from R ECA stenosis as ICA 1-39% stenosed on latest carotid US (08/2020). Regularly sees [facility].

## 2021-05-22 NOTE — Assessment & Plan Note (Signed)
Chronic, stable.  Home readings largely well controlled, diastolics somewhat low - will continue to monitor.  BP elevated in office however he hasn't taken AM meds yet this morning - will go home and take this.

## 2021-06-08 DIAGNOSIS — H35373 Puckering of macula, bilateral: Secondary | ICD-10-CM | POA: Diagnosis not present

## 2021-06-11 DIAGNOSIS — U071 COVID-19: Secondary | ICD-10-CM

## 2021-06-11 HISTORY — DX: COVID-19: U07.1

## 2021-06-24 ENCOUNTER — Telehealth: Payer: Self-pay | Admitting: Family Medicine

## 2021-06-24 NOTE — Chronic Care Management (AMB) (Signed)
°  Chronic Care Management   Outreach Note  06/24/2021 Name: CHRISTY FRIEDE MRN: [id number] DOB: 16-Jan-1941  Referred by: [name], MD Reason for referral : No chief complaint on file.   An unsuccessful telephone outreach was attempted today. The patient was referred to the pharmacist for assistance with care management and care coordination.   Follow Up Plan:   Tatjana Dellinger Upstream Scheduler

## 2021-06-29 ENCOUNTER — Telehealth: Payer: Self-pay | Admitting: Family Medicine

## 2021-06-29 NOTE — Chronic Care Management (AMB) (Signed)
°  Chronic Care Management   Note  06/29/2021 Name: Craig Avery MRN: [id number] DOB: 1941/05/22  Craig Avery is a 80 y.o. year old male who is a primary care patient of [name], MD. I reached out to [name] by phone today in response to a referral sent by [name]'s PCP, [name], MD.   [name] was given information about Chronic Care Management services today including:  CCM service includes personalized support from designated clinical staff supervised by his physician, including individualized plan of care and coordination with other care providers 24/7 contact phone numbers for assistance for urgent and routine care needs. Service will only be billed when office clinical staff spend 20 minutes or more in a month to coordinate care. Only one practitioner may furnish and bill the service in a calendar month. The patient may stop CCM services at any time (effective at the end of the month) by phone call to the office staff.   Patient agreed to services and verbal consent obtained.   Follow up plan:   Tatjana Secretary/administrator

## 2021-06-30 ENCOUNTER — Telehealth (INDEPENDENT_AMBULATORY_CARE_PROVIDER_SITE_OTHER): Payer: Medicare Other | Admitting: Family Medicine

## 2021-06-30 ENCOUNTER — Other Ambulatory Visit: Payer: Self-pay

## 2021-06-30 ENCOUNTER — Encounter: Payer: Self-pay | Admitting: Family Medicine

## 2021-06-30 DIAGNOSIS — I6523 Occlusion and stenosis of bilateral carotid arteries: Secondary | ICD-10-CM | POA: Diagnosis not present

## 2021-06-30 DIAGNOSIS — U071 COVID-19: Secondary | ICD-10-CM | POA: Insufficient documentation

## 2021-06-30 MED ORDER — MOLNUPIRAVIR EUA 200MG CAPSULE: 4.0000 | ORAL_CAPSULE | Freq: Two times a day (BID) | ORAL | 0 refills | Status: AC

## 2021-06-30 NOTE — Assessment & Plan Note (Addendum)
Reviewed currently approved EUA treatments.  Reviewed expected course of illness, anticipated course of recovery, as well as red flags to suggest COVID pneumonia and/or to seek urgent in-person care. Reviewed latest CDC isolation/quarantine guidelines.  Encouraged fluids and rest. Reviewed further supportive care measures at home including vit C 500mg  bid, vit D 2000 IU daily, zinc 100mg  daily, tylenol PRN, pepcid 20mg  BID PRN.   Recommend:  Molnupiravir Paxlovid drug interactions:  Amlodipine, atorvastatin, carvedilol, clopidogrel

## 2021-06-30 NOTE — Progress Notes (Signed)
Patient ID: Craig Avery, male    DOB: 03/06/1941, 80 y.o.   MRN: [id number]  Virtual visit completed through [employer], a video enabled telemedicine application. Due to national recommendations of social distancing due to COVID-19, a virtual visit is felt to be most appropriate for this patient at this time. Reviewed limitations, risks, security and privacy concerns of performing a virtual visit and the availability of in person appointments. I also reviewed that there may be a patient responsible charge related to this service. The patient agreed to proceed.   Patient location: home Provider location: [place] at [facility], office Persons participating in this virtual visit: patient, provider   If any vitals were documented, they were collected by patient at home unless specified below.   BP 132/74    Pulse 74    Temp 98.6 F (37 C)    Ht 5\' 3"  (1.6 m)    Wt 199 lb (90.3 kg)    BMI 35.25 kg/m    CC: COVID positive  Subjective:   HPI: Craig Avery is a 80 y.o. male presenting on 06/30/2021 for Covid Positive (C/o cough, head congestion and ST- at night.  Sxs started 06/28/21.  Pos home COVID test- 06/29/21. Tried Dayquil/Nyquil and OTC Cold/Flu/ST. )   First day of symptoms: 06/28/2021 Tested COVID positive: 06/29/2021  Current symptoms: mild cough, head congestion, ST  No: fevers/chills, loss of taste/smell, abd pain, nausea or diarrhea, dyspnea or wheezing.  Treatments to date: nyquil, dayquil, OTC cold and flu medication (tylenol, dextromethorphan, guaifenesin, phenylephrine) Risk factors include: age, HTN, CVA, obesity, CKD   COVID vaccination status: [place] 10/2019, 11/2019, thinks has had 1 booster as well  H/o COVID pneumonia s/p prolonged hospitalization 06/2019.  Decreased appetite since COVID 06/2019.      Relevant past medical, surgical, family and social history reviewed and updated as indicated. Interim medical history since our last visit  reviewed. Allergies and medications reviewed and updated. Outpatient Medications Prior to Visit  Medication Sig Dispense Refill   amLODipine (NORVASC) 10 MG tablet Take 1 tablet (10 mg total) by mouth daily. 90 tablet 3   atorvastatin (LIPITOR) 80 MG tablet Take 1 tablet (80 mg total) by mouth daily. 90 tablet 3   carvedilol (COREG) 12.5 MG tablet Take 1 tablet (12.5 mg total) by mouth 2 (two) times daily. 180 tablet 3   cloNIDine (CATAPRES) 0.1 MG tablet Take 1 tablet (0.1 mg total) by mouth 2 (two) times daily. 180 tablet 3   clopidogrel (PLAVIX) 75 MG tablet TAKE 1 TABLET BY MOUTH EVERY DAY 90 tablet 3   Docusate Calcium (STOOL SOFTENER PO) Take by mouth daily.     hydrALAZINE (APRESOLINE) 25 MG tablet Take 1 tablet (25 mg total) by mouth 3 (three) times daily as needed. Take for SBP >160 180 tablet 3   lisinopril (ZESTRIL) 20 MG tablet Take 1 tablet (20 mg total) by mouth daily. 30 tablet 3   loratadine (CLARITIN) 10 MG tablet Take 10 mg by mouth daily.     Multiple Vitamins-Minerals (CENTRUM SILVER ADULT 50+ PO) Take 1 tablet by mouth daily.     No facility-administered medications prior to visit.    Per HPI unless specifically indicated in ROS section below Review of Systems Objective:  BP 132/74    Pulse 74    Temp 98.6 F (37 C)    Ht 5\' 3"  (1.6 m)    Wt 199 lb (90.3 kg)    BMI 35.25 kg/m  Wt Readings from Last 3 Encounters:  06/30/21 199 lb (90.3 kg)  05/22/21 202 lb 9 oz (91.9 kg)  05/06/21 201 lb (91.2 kg)       Physical exam: Gen: alert, NAD, not ill appearing Pulm: speaks in complete sentences without increased work of breathing Psych: normal mood, normal thought content      Results for orders placed or performed during the hospital encounter of 62/26/33  Basic Metabolic Panel (BMET)  Result Value Ref Range   Sodium 141 135 - 145 mmol/L   Potassium 4.8 3.5 - 5.1 mmol/L   Chloride 108 98 - 111 mmol/L   CO2 27 22 - 32 mmol/L   Glucose, Bld 115 (H) 70 - 99  mg/dL   BUN 27 (H) 8 - 23 mg/dL   Creatinine, Ser 1.57 (H) 0.61 - 1.24 mg/dL   Calcium 8.9 8.9 - 10.3 mg/dL   GFR, Estimated 44 (L) >60 mL/min   Anion gap 6 5 - 15   Assessment & Plan:   Problem List Items Addressed This Visit     COVID-19 virus infection    Reviewed currently approved EUA treatments.  Reviewed expected course of illness, anticipated course of recovery, as well as red flags to suggest COVID pneumonia and/or to seek urgent in-person care. Reviewed latest CDC isolation/quarantine guidelines.  Encouraged fluids and rest. Reviewed further supportive care measures at home including vit C 500mg  bid, vit D 2000 IU daily, zinc 100mg  daily, tylenol PRN, pepcid 20mg  BID PRN.   Recommend:  Molnupiravir Paxlovid drug interactions:  Amlodipine, atorvastatin, carvedilol, clopidogrel       Relevant Medications   molnupiravir EUA (LAGEVRIO) 200 mg CAPS capsule     Meds ordered this encounter  Medications   molnupiravir EUA (LAGEVRIO) 200 mg CAPS capsule    Sig: Take 4 capsules (800 mg total) by mouth 2 (two) times daily for 5 days.    Dispense:  40 capsule    Refill:  0   No orders of the defined types were placed in this encounter.   I discussed the assessment and treatment plan with the patient. The patient was provided an opportunity to ask questions and all were answered. The patient agreed with the plan and demonstrated an understanding of the instructions. The patient was advised to call back or seek an in-person evaluation if the symptoms worsen or if the condition fails to improve as anticipated.  Follow up plan: No follow-ups on file.  [name], MD

## 2021-08-12 ENCOUNTER — Telehealth: Payer: Self-pay

## 2021-08-12 NOTE — Chronic Care Management (AMB) (Signed)
Chronic Care Management Pharmacy Assistant   Name: Craig Avery  MRN: [id number] DOB: 12/24/1940  Craig Avery is an 81 y.o. year old male who presents for his initial CCM visit with the clinical pharmacist.  Reason for Encounter: Initial Questions   Conditions to be addressed/monitored: HTN, HLD, and CKD Stage 3   Recent office visits:  06/30/21-PCP-Javier Gutierrez,MD-Telemedicine-Patient presented for positive Covid-19 test.Start Molnupiravir EUA 200mg  take 4 capsules 2 times a day for 5 days  05/22/21-PCP-Javier Gutierrez,MD-Patient presented for follow up hypertension.Flu shot given,follow up 6 months  Recent consult visits:  05/06/21-Cardiology- Christia Reading Gollan,MD-Patient presented for follow up vascular disease.Imaging discussed,we have recommended he start clonidine 0.1 twice daily, change hydralazine to as needed,recommend diet and exercise, avoid nsaids,start to monitor heart rate and BP at home.  04/09/21-Cardiology-Cadence Furth,PA-Patient presented for follow up heart disease.I will start hydralazine 25mg  TID. Recommend daily Bps 03/13/21-Cardiology-Christopher End,MD- Lab work only 02/25/21-Cardiology-Christopher End,MD-Patient presented for claudication. EKG,  We have agreed to increase lisinopril to 20 mg daily,labs ordered.  Hospital visits:  None in previous 6 months  Medications: Outpatient Encounter Medications as of 08/12/2021  Medication Sig   amLODipine (NORVASC) 10 MG tablet Take 1 tablet (10 mg total) by mouth daily.   atorvastatin (LIPITOR) 80 MG tablet Take 1 tablet (80 mg total) by mouth daily.   carvedilol (COREG) 12.5 MG tablet Take 1 tablet (12.5 mg total) by mouth 2 (two) times daily.   cloNIDine (CATAPRES) 0.1 MG tablet Take 1 tablet (0.1 mg total) by mouth 2 (two) times daily.   clopidogrel (PLAVIX) 75 MG tablet TAKE 1 TABLET BY MOUTH EVERY DAY   Docusate Calcium (STOOL SOFTENER PO) Take by mouth daily.   hydrALAZINE (APRESOLINE) 25 MG tablet  Take 1 tablet (25 mg total) by mouth 3 (three) times daily as needed. Take for SBP >160   lisinopril (ZESTRIL) 20 MG tablet Take 1 tablet (20 mg total) by mouth daily.   loratadine (CLARITIN) 10 MG tablet Take 10 mg by mouth daily.   Multiple Vitamins-Minerals (CENTRUM SILVER ADULT 50+ PO) Take 1 tablet by mouth daily.   No facility-administered encounter medications on file as of 08/12/2021.    Lab Results  Component Value Date/Time   HGBA1C 6.1 (H) 07/19/2019 12:15 AM   HGBA1C 5.6 11/10/2018 11:03 AM   MICROALBUR 0.5 11/10/2018 02:57 PM   MICROALBUR <0.7 11/24/2015 08:41 AM     BP Readings from Last 3 Encounters:  06/30/21 132/74  05/22/21 120/60  05/06/21 (!) 158/60    Patient contacted to review initial questions prior to visit with Charlene Brooke.  Have you seen any other providers since your last visit with PCP? No  Any changes in your medications or health? No  Any side effects from any medications? No  Do you have an symptoms or problems not managed by your medications? No  Any concerns about your health right now? No  Has your provider asked that you check blood pressure, blood sugar, or follow special diet at home? Yes  The patient reports he now has BP monitor and takes BP's often  Do you get any type of exercise on a regular basis? No The patient reports he tries to walk for exercise but has vascular claudication and legs can be painful  Can you think of a goal you would like to reach for your health? No  Do you have any problems getting your medications? No  [facility] is  good to the patient  Is there anything that you would like to discuss during the appointment? No   Spoke with patient and reminded them to have all medications, supplements and any blood glucose and blood pressure readings available for review with pharmacist, at their telephone visit on 08/14/21 at 9:00am.   Star Rating Drugs:  Medication:  Last Fill: Day  Supply Atorvastatin 80mg  06/08/21 90   Care Gaps: Annual wellness visit in last year? Yes Most Recent BP reading:132/74  74-P    Marjo Bicker  CPP notified  Avel Sensor, [place] Assistant [phone]  Total time spent for month CPA: 40 min.

## 2021-08-14 ENCOUNTER — Ambulatory Visit (INDEPENDENT_AMBULATORY_CARE_PROVIDER_SITE_OTHER): Payer: Medicare Other | Admitting: Pharmacist

## 2021-08-14 ENCOUNTER — Other Ambulatory Visit: Payer: Self-pay

## 2021-08-14 DIAGNOSIS — I10 Essential (primary) hypertension: Secondary | ICD-10-CM

## 2021-08-14 DIAGNOSIS — I739 Peripheral vascular disease, unspecified: Secondary | ICD-10-CM

## 2021-08-14 DIAGNOSIS — Z8673 Personal history of transient ischemic attack (TIA), and cerebral infarction without residual deficits: Secondary | ICD-10-CM

## 2021-08-14 DIAGNOSIS — E785 Hyperlipidemia, unspecified: Secondary | ICD-10-CM

## 2021-08-14 DIAGNOSIS — N1832 Chronic kidney disease, stage 3b: Secondary | ICD-10-CM

## 2021-08-14 NOTE — Patient Instructions (Signed)
Visit Information  Phone number for Pharmacist: [phone]  Thank you for meeting with me to discuss your medications! I look forward to working with you to achieve your health care goals. Below is a summary of what we talked about during the visit:   Goals Addressed             This Visit's Progress    Track and Manage My Blood Pressure-Hypertension       Timeframe:  Long-Range Goal Priority:  Medium Start Date:        08/14/21                     Expected End Date:  08/14/22                     Follow Up Date Feb 2024   - check blood pressure daily - choose a place to take my blood pressure (home, clinic or office, retail store) - write blood pressure results in a log or diary    Why is this important?   You won't feel high blood pressure, but it can still hurt your blood vessels.  High blood pressure can cause heart or kidney problems. It can also cause a stroke.  Making lifestyle changes like losing a little weight or eating less salt will help.  Checking your blood pressure at home and at different times of the day can help to control blood pressure.  If the doctor prescribes medicine remember to take it the way the doctor ordered.  Call the office if you cannot afford the medicine or if there are questions about it.     Notes:         Care Plan : [place]  Updates made by Charlton Haws, RPH since 08/14/2021 12:00 AM     Problem: Hypertension, Hyperlipidemia, and Chronic Kidney Disease, PAD   Priority: High     Long-Range Goal: Disease mgmt   Start Date: 08/14/2021  Expected End Date: 08/14/2022  This Visit's Progress: On track  Priority: High  Note:   Current Barriers:  Unable to independently monitor therapeutic efficacy  Pharmacist Clinical Goal(s):  Patient will achieve adherence to monitoring guidelines and medication adherence to achieve therapeutic efficacy through collaboration with PharmD and provider.   Interventions: 1:1  collaboration with [name], MD regarding development and update of comprehensive plan of care as evidenced by provider attestation and co-signature Inter-disciplinary care team collaboration (see longitudinal plan of care) Comprehensive medication review performed; medication list updated in electronic medical record  Hypertension / CKD stage 3b (BP goal <140/90) -Controlled - BP is at goal in recent office visits; pt checks BP daily in AM, if SBP over 160 he takes hydralazine -Current treatment: Amlodipine 10 mg daily AM Carvedilol 12.5 mg BID Clonidine 0.1 mg BID Hydralazine 25 mg TID SBP > 160 Lisinopril 20 mg daily PM -Medications previously tried: n/a  -Denies hypotensive/hypertensive symptoms -Educated on BP goals and benefits of medications for prevention of heart attack, stroke and kidney damage; -Counseled to monitor BP at home daily, document, and provide log at future appointments -Recommended to continue current medication  Hyperlipidemia: (LDL goal < 70) -Controlled - LDL is at goal; pt endorses compliance with statin; denies bleeding issues on clopidogrel -Hx stroke, PVD -Current treatment: Atorvastatin 80 mg daily Clopidogrel 75 mg daily AM -Educated on Cholesterol goals; Benefits of statin for ASCVD risk reduction; -Recommended to continue current medication  Health Maintenance -  Vaccine gaps: Prevnar, Shingrix, covid booster -Advised to get Prevnar at upcoming PCP visit -Current therapy:  Docusate Loratadine 10 mg AM Multivitamin B complex -Patient is satisfied with current therapy and denies issues -Recommended to continue current medication  Patient Goals/Self-Care Activities Patient will:  - take medications as prescribed as evidenced by patient report and record review focus on medication adherence by pill box check blood pressure daily, document, and provide at future appointments      Mr. Nienhaus was given information about Chronic Care  Management services today including:  CCM service includes personalized support from designated clinical staff supervised by his physician, including individualized plan of care and coordination with other care providers 24/7 contact phone numbers for assistance for urgent and routine care needs. Standard insurance, coinsurance, copays and deductibles apply for chronic care management only during months in which we provide at least 20 minutes of these services. Most insurances cover these services at 100%, however patients may be responsible for any copay, coinsurance and/or deductible if applicable. This service may help you avoid the need for more expensive face-to-face services. Only one practitioner may furnish and bill the service in a calendar month. The patient may stop CCM services at any time (effective at the end of the month) by phone call to the office staff.  Patient agreed to services and verbal consent obtained.   Patient verbalizes understanding of instructions and care plan provided today and agrees to view in [place]. Active MyChart status confirmed with patient.   Telephone follow up appointment with pharmacy team member scheduled for: 1 year  Charlene Brooke, PharmD, [facility] Clinical Pharmacist [place] [facility] [phone]

## 2021-08-14 NOTE — Progress Notes (Signed)
Chronic Care Management Pharmacy Note  08/14/2021 Name:  Craig Avery MRN:  [id number] DOB:  1940-12-06  Summary: -Initial CCM visit: pt endorses compliance with medications as prescribed; daughter sets up pill box for him -BP is at goal; pt is checking BP daily  Recommendations/Changes made from today's visit: -No med changes -Advised Prevnar vaccine at next PCP visit - pt agreed  Plan: -[name] will call patient 6 months for adherence review -Pharmacist follow up televisit scheduled for 1 year -PCP 31-monthf/u 11/20/21   Subjective: Craig Avery an 81y.o. year old male who is a primary patient of GRia Bush MD.  The CCM team was consulted for assistance with disease management and care coordination needs.    Engaged with patient by telephone for initial visit in response to provider referral for pharmacy case management and/or care coordination services.   Consent to Services:  The patient was given the following information about Chronic Care Management services today, agreed to services, and gave verbal consent: 1. CCM service includes personalized support from designated clinical staff supervised by the primary care provider, including individualized plan of care and coordination with other care providers 2. 24/7 contact phone numbers for assistance for urgent and routine care needs. 3. Service will only be billed when office clinical staff spend 20 minutes or more in a month to coordinate care. 4. Only one practitioner may furnish and bill the service in a calendar month. 5.The patient may stop CCM services at any time (effective at the end of the month) by phone call to the office staff. 6. The patient will be responsible for cost sharing (co-pay) of up to 20% of the service fee (after annual deductible is met). Patient agreed to services and consent obtained.  Patient Care Team: GRia Bush MD as PCP - General (Family Medicine) GRockey Situ TKathlene November MD as PCP - Cardiology (Cardiology) FCharlton Haws [facility] Hospitalas Pharmacist (Pharmacist)  Recent office visits: 06/30/21-PCP-Javier Gutierrez,MD-Telemedicine-Patient presented for positive Covid-19 test.Start Molnupiravir EUA 2090mtake 4 capsules 2 times a day for 5 days  05/22/21-PCP-Javier Gutierrez,MD-Patient presented for follow up hypertension.Flu shot given,follow up 6 months  Recent consult visits: 05/06/21-Cardiology- TiChristia Readingollan,MD-Patient presented for follow up vascular disease.Imaging discussed, start clonidine 0.1 twice daily, change hydralazine to as needed,recommend diet and exercise, avoid nsaids,start to monitor heart rate and BP at home.   04/09/21-Cardiology-Cadence Furth,PA-Patient presented for follow up heart disease. start hydralazine 2587mID. Recommend daily BP  02/25/21-Cardiology-Christopher End,MD-Patient presented for claudication. EKG, Increase lisinopril to 20 mg daily,labs ordered.  Hospital visits: None in previous 6 months   Objective:  Lab Results  Component Value Date   CREATININE 1.57 (H) 03/13/2021   BUN 27 (H) 03/13/2021   GFR 42.27 (L) 02/18/2021   EGFR 41 (L) 10/27/2020   GFRNONAA 44 (L) 03/13/2021   GFRAA >60 07/23/2019   NA 141 03/13/2021   K 4.8 03/13/2021   CALCIUM 8.9 03/13/2021   CO2 27 03/13/2021   GLUCOSE 115 (H) 03/13/2021    Lab Results  Component Value Date/Time   HGBA1C 6.1 (H) 07/19/2019 12:15 AM   HGBA1C 5.6 11/10/2018 11:03 AM   GFR 42.27 (L) 02/18/2021 08:10 AM   GFR 36.80 (L) 11/17/2020 01:53 PM   MICROALBUR 0.5 11/10/2018 02:57 PM   MICROALBUR <0.7 11/24/2015 08:41 AM    Last diabetic Eye exam: No results found for: HMDIABEYEEXA  Last diabetic Foot exam: No results found for: HMDIABFOOTEX   Lab  Results  Component Value Date   CHOL 136 11/17/2020   HDL 49.60 11/17/2020   LDLCALC 67 11/17/2020   LDLDIRECT 74.0 07/07/2016   TRIG 98.0 11/17/2020   CHOLHDL 3 11/17/2020    Hepatic Function  Latest Ref Rng & Units 02/18/2021 11/17/2020 11/17/2020  Total Protein 6.1 - 8.1 g/dL - 6.6 6.5  Albumin 3.5 - 5.2 g/dL 4.0 - 4.2  AST 0 - 37 U/L - - 19  ALT 0 - 53 U/L - - 23  Alk Phosphatase 39 - 117 U/L - - 56  Total Bilirubin 0.2 - 1.2 mg/dL - - 0.5  Bilirubin, Direct 0.00 - 0.40 mg/dL - - -    Lab Results  Component Value Date/Time   TSH 4.50 11/17/2020 01:53 PM   TSH 1.108 07/22/2019 05:01 AM   TSH 4.71 (H) 11/10/2018 11:03 AM    CBC Latest Ref Rng & Units 11/17/2020 11/19/2019 11/14/2019  WBC 4.0 - 10.5 K/uL 8.0 7.7 10.1  Hemoglobin 13.0 - 17.0 g/dL 12.7(L) 13.1 13.2  Hematocrit 39.0 - 52.0 % 36.8(L) 38.5(L) 39.2  Platelets 150.0 - 400.0 K/uL 269.0 242.0 246.0    Lab Results  Component Value Date/Time   VD25OH 39.50 11/17/2020 01:53 PM   VD25OH 47.17 11/14/2019 09:46 AM    Clinical ASCVD: Yes  The ASCVD Risk score (Arnett DK, et al., 2019) failed to calculate for the following reasons:   The 2019 ASCVD risk score is only valid for ages 81 to 8    Depression screen PHQ 2/9 11/18/2020 11/14/2019 11/10/2018  Decreased Interest 0 0 0  Down, Depressed, Hopeless 0 0 0  PHQ - 2 Score 0 0 0  Altered sleeping 0 0 0  Tired, decreased energy 1 0 0  Change in appetite 0 0 0  Feeling bad or failure about yourself  0 0 0  Trouble concentrating 0 0 0  Moving slowly or fidgety/restless 0 0 0  Suicidal thoughts 0 0 0  PHQ-9 Score 1 0 0  Difficult doing work/chores - Not difficult at all Not difficult at all     Social History   Tobacco Use  Smoking Status Former   Packs/day: 1.50   Years: 33.00   Pack years: 49.50   Types: Cigarettes   Quit date: 06/18/1994   Years since quitting: 27.1  Smokeless Tobacco Never   BP Readings from Last 3 Encounters:  06/30/21 132/74  05/22/21 120/60  05/06/21 (!) 158/60   Pulse Readings from Last 3 Encounters:  06/30/21 74  05/22/21 66  05/06/21 (!) 58   Wt Readings from Last 3 Encounters:  06/30/21 199 lb (90.3 kg)  05/22/21 202 lb 9 oz  (91.9 kg)  05/06/21 201 lb (91.2 kg)   BMI Readings from Last 3 Encounters:  06/30/21 35.25 kg/m  05/22/21 35.88 kg/m  05/06/21 34.50 kg/m    Assessment/Interventions: Review of patient past medical history, allergies, medications, health status, including review of consultants reports, laboratory and other test data, was performed as part of comprehensive evaluation and provision of chronic care management services.   SDOH:  (Social Determinants of Health) assessments and interventions performed: Yes SDOH Interventions    Flowsheet Row Most Recent Value  SDOH Interventions   Food Insecurity Interventions Intervention Not Indicated  Financial Strain Interventions Intervention Not Indicated      SDOH Screenings   Alcohol Screen: Not on file  Depression (PHQ2-9): Low Risk    PHQ-2 Score: 1  Financial Resource Strain: Low Risk  Difficulty of Paying Living Expenses: Not hard at all  Food Insecurity: No Food Insecurity   Worried About Charity fundraiser in the Last Year: Never true   Ran Out of Food in the Last Year: Never true  Housing: Not on file  Physical Activity: Not on file  Social Connections: Not on file  Stress: Not on file  Tobacco Use: Medium Risk   Smoking Tobacco Use: Former   Smokeless Tobacco Use: Never   Passive Exposure: Not on file  Transportation Needs: Not on file    [place]  No Known Allergies  Medications Reviewed Today     Reviewed by Charlton Haws, [facility] (Pharmacist) on 08/14/21 at [phone]  Med List Status: <None>   Medication Order Taking? Sig Documenting Provider Last Dose Status Informant  amLODipine (NORVASC) 10 MG tablet [id number] Yes Take 1 tablet (10 mg total) by mouth daily. Minna Merritts, MD Taking Active   atorvastatin (LIPITOR) 80 MG tablet [id number] Yes Take 1 tablet (80 mg total) by mouth daily. Minna Merritts, MD Taking Active   carvedilol (COREG) 12.5 MG tablet [id number] Yes Take 1 tablet (12.5 mg total) by  mouth 2 (two) times daily. Minna Merritts, MD Taking Active   cloNIDine (CATAPRES) 0.1 MG tablet [id number] Yes Take 1 tablet (0.1 mg total) by mouth 2 (two) times daily. Minna Merritts, MD Taking Active   clopidogrel (PLAVIX) 75 MG tablet [id number] Yes TAKE 1 TABLET BY MOUTH EVERY DAY [name], MD Taking Active   Docusate Calcium (STOOL SOFTENER PO) [id number] Yes Take by mouth daily. [provider] Taking Active   hydrALAZINE (APRESOLINE) 25 MG tablet [id number] Yes Take 1 tablet (25 mg total) by mouth 3 (three) times daily as needed. Take for SBP >160 Gollan, Kathlene November, MD Taking Active   lisinopril (ZESTRIL) 20 MG tablet [id number]  Take 1 tablet (20 mg total) by mouth daily. Minna Merritts, MD  Expired 07/05/21 2359   loratadine (CLARITIN) 10 MG tablet [id number] Yes Take 10 mg by mouth daily. [provider] Taking Active   Multiple Vitamins-Minerals (CENTRUM SILVER ADULT 50+ PO) [id number] Yes Take 1 tablet by mouth daily. 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   Lymphocytosis 11/19/2019   Anemia 11/14/2019   Pneumonia due to COVID-19 virus 07/21/2019   Claudication in peripheral vascular disease ([place]) 01/02/2019   PVD (peripheral vascular disease) with claudication ([place]) 11/16/2017   Ex-smoker 04/27/2017   BCC (basal cell carcinoma), face 12/30/2016   Right shoulder pain 11/24/2015   Personal history of colonic polyps    Vasovagal syncope 11/20/2014   Constipation 11/20/2014   Medicare annual wellness visit, subsequent 03/25/2014   Advanced care planning/counseling discussion 03/25/2014   CKD (chronic kidney disease) stage 3, GFR 30-59 ml/min ([place]) 03/25/2014   Advance directive discussed with patient 03/25/2014   Fatigue 12/13/2012   Disturbance of skin sensation 08/10/2012    Obesity, Class I, BMI 30-34.9    TIA (transient ischemic attack) 05/21/2012   Hyperlipidemia LDL goal <70 05/21/2012   HTN (hypertension) 05/18/2012   History of stroke 05/18/2012   Carotid stenosis, bilateral 05/18/2012   Left homonymous hemianopsia 05/12/2012    Immunization History  Administered Date(s) Administered   Fluad Quad(high Dose 65+) 05/17/2019, 05/21/2020, 05/22/2021  Influenza-Unspecified 03/12/2014   PFIZER(Purple Top)SARS-COV-2 Vaccination 10/25/2019, 11/15/2019   Tdap 11/16/2014    Conditions to be addressed/monitored:  Hypertension, Hyperlipidemia, and Chronic Kidney Disease, PAD  Care Plan : [place]  Updates made by Charlton Haws, [place] since 08/14/2021 12:00 AM     Problem: Hypertension, Hyperlipidemia, and Chronic Kidney Disease, PAD   Priority: High     Long-Range Goal: Disease mgmt   Start Date: 08/14/2021  Expected End Date: 08/14/2022  This Visit's Progress: On track  Priority: High  Note:   Current Barriers:  Unable to independently monitor therapeutic efficacy  Pharmacist Clinical Goal(s):  Patient will achieve adherence to monitoring guidelines and medication adherence to achieve therapeutic efficacy through collaboration with PharmD and provider.   Interventions: 1:1 collaboration with [name], MD regarding development and update of comprehensive plan of care as evidenced by provider attestation and co-signature Inter-disciplinary care team collaboration (see longitudinal plan of care) Comprehensive medication review performed; medication list updated in electronic medical record  Hypertension / CKD stage 3b (BP goal <140/90) -Controlled - BP is at goal in recent office visits; pt checks BP daily in AM, if SBP over 160 he takes hydralazine -Current treatment: Amlodipine 10 mg daily AM Carvedilol 12.5 mg BID Clonidine 0.1 mg BID Hydralazine 25 mg TID SBP > 160 Lisinopril 20 mg daily PM -Medications  previously tried: n/a  -Denies hypotensive/hypertensive symptoms -Educated on BP goals and benefits of medications for prevention of heart attack, stroke and kidney damage; -Counseled to monitor BP at home daily, document, and provide log at future appointments -Recommended to continue current medication  Hyperlipidemia: (LDL goal < 70) -Controlled - LDL is at goal; pt endorses compliance with statin; denies bleeding issues on clopidogrel -Hx stroke, PVD -Current treatment: Atorvastatin 80 mg daily Clopidogrel 75 mg daily AM -Educated on Cholesterol goals; Benefits of statin for ASCVD risk reduction; -Recommended to continue current medication  Health Maintenance -Vaccine gaps: Prevnar, Shingrix, covid booster -Advised to get Prevnar at upcoming PCP visit -Current therapy:  Docusate Loratadine 10 mg AM Multivitamin B complex -Patient is satisfied with current therapy and denies issues -Recommended to continue current medication  Patient Goals/Self-Care Activities Patient will:  - take medications as prescribed as evidenced by patient report and record review focus on medication adherence by pill box check blood pressure daily, document, and provide at future appointments      Medication Assistance: None required.  Patient affirms current coverage meets needs.  Compliance/Adherence/Medication fill history: Care Gaps: NONE  Star-Rating Drugs: Lisinopril - LF 07/31/21 x 30 ds; Fernando Salinas 100%  Patient[facility]  [id number] [address] [place]Phone: [phone]: [address]Phone: [phone]: [phone] Uses pill box? Yes - daughter fills pillbox x 3 weeks Pt endorses 100% compliance  We discussed: Current pharmacy is preferred with insurance plan and patient is satisfied with pharmacy  services Patient decided to: Continue current medication management strategy  Care Plan and Follow Up Patient Decision:  Patient agrees to Care Plan and Follow-up.  Plan: Telephone follow up appointment with care management team member scheduled for:  1 year  LCharlene Brooke PharmD, BFairview Regional Medical CenterClinical Pharmacist [facility]Hamilton[facility] at [phone]

## 2021-08-26 ENCOUNTER — Other Ambulatory Visit: Payer: Self-pay | Admitting: Cardiovascular Disease

## 2021-09-08 DIAGNOSIS — E785 Hyperlipidemia, unspecified: Secondary | ICD-10-CM | POA: Diagnosis not present

## 2021-09-08 DIAGNOSIS — I10 Essential (primary) hypertension: Secondary | ICD-10-CM | POA: Diagnosis not present

## 2021-09-11 DIAGNOSIS — Z1152 Encounter for screening for COVID-19: Secondary | ICD-10-CM | POA: Diagnosis not present

## 2021-09-11 DIAGNOSIS — Z20828 Contact with and (suspected) exposure to other viral communicable diseases: Secondary | ICD-10-CM | POA: Diagnosis not present

## 2021-09-22 DIAGNOSIS — Z20822 Contact with and (suspected) exposure to covid-19: Secondary | ICD-10-CM | POA: Diagnosis not present

## 2021-10-18 DIAGNOSIS — Z20822 Contact with and (suspected) exposure to covid-19: Secondary | ICD-10-CM | POA: Diagnosis not present

## 2021-10-29 ENCOUNTER — Other Ambulatory Visit: Payer: Self-pay

## 2021-10-29 DIAGNOSIS — I6523 Occlusion and stenosis of bilateral carotid arteries: Secondary | ICD-10-CM

## 2021-11-06 DIAGNOSIS — Z20822 Contact with and (suspected) exposure to covid-19: Secondary | ICD-10-CM | POA: Diagnosis not present

## 2021-11-09 ENCOUNTER — Encounter: Payer: Self-pay | Admitting: Surgery

## 2021-11-09 ENCOUNTER — Ambulatory Visit (INDEPENDENT_AMBULATORY_CARE_PROVIDER_SITE_OTHER): Payer: Medicare Other | Admitting: Surgery

## 2021-11-09 ENCOUNTER — Ambulatory Visit (HOSPITAL_COMMUNITY)
Admission: RE | Admit: 2021-11-09 | Discharge: 2021-11-09 | Disposition: A | Discharge status: Home / Self Care | Payer: Medicare Other | Source: Ambulatory Visit | Attending: Surgery | Admitting: Surgery

## 2021-11-09 VITALS — BP 213/101 | HR 75 | Temp 98.4°F | Resp 20 | Ht 63.0 in | Wt 202.0 lb

## 2021-11-09 DIAGNOSIS — I6523 Occlusion and stenosis of bilateral carotid arteries: Secondary | ICD-10-CM

## 2021-11-09 DIAGNOSIS — I6522 Occlusion and stenosis of left carotid artery: Secondary | ICD-10-CM

## 2021-11-09 NOTE — Progress Notes (Signed)
? ?[facility] ? ?Patient name: Craig Avery MRN: [id number] DOB: 04/07/1941 Sex: male ? ? ?REASON FOR VISIT:  ? ? ?Follow-up ? ?HISOTRY OF PRESENT ILLNESS:  ? ? ?Craig Avery is a 81 y.o. male who is status post left carotid endarterectomy for asymptomatic stenosis on 07/21/2012.  Intraoperative findings included a 90% stenosis.  He is back today for follow-up.  He denies any new symptoms.  He is without numbness or weakness in either extremity, slurred speech, or amaurosis fugax. ? ?Patient has a history of a right brain stroke.  This was felt to be secondary to hypotension and other factors as he did not have significant right carotid stenosis.  He has not had any new symptoms.  He continues to be a non-smoker.  He takes a statin for hypercholesterolemia.  He is medically managed for hypertension.  He denies symptoms of claudication. ? ? ?PAST MEDICAL HISTORY:  ? ?Past Medical History:  ?Diagnosis Date  ? Carotid stenosis 01/2013  ? bilateral, s/p L CEA, plan rpt [place] qyear  ? HCAP (healthcare-associated pneumonia) 07/21/2019  ? History of chicken pox   ? HLD (hyperlipidemia)   ? HTN (hypertension)   ? 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 from CVA - completely resolved as of 08/2012 ophtho eval ([facility])  ? Obesity   ? Pneumonia due to COVID-19 virus 06/2019  ? s/p hospitalization with PNA  ? Stroke [facility]) 05/2012  ? R PCA territory, presented with L paresthesias  ? ? ? ?FAMILY HISTORY:  ? ?Family History  ?Problem Relation Age of Onset  ? CAD Brother 98  ?     MI  ? Heart disease Brother   ?     before age 43  ? Heart attack Brother   ? Hypertension Mother   ? Cirrhosis Father 39  ?     EtOHic  ? Aneurysm Paternal Uncle 51  ? Heart disease Daughter   ? Hyperlipidemia Daughter   ? ? ?SOCIAL HISTORY:  ? ?Social History  ? ?Tobacco Use  ? Smoking status: Former  ?  Packs/day: 1.50  ?  Years: 33.00  ?  Pack years: 49.50  ?  Types: Cigarettes  ?  Quit  date: 06/18/1994  ?  Years since quitting: 27.4  ? Smokeless tobacco: Never  ?Substance Use Topics  ? Alcohol use: Yes  ?  Alcohol/week: 8.0 - 10.0 standard drinks  ?  Types: 8 - 10 Cans of beer per week  ?  Comment: weekly (beer)  ? ? ? ?ALLERGIES:  ? ?No Known Allergies ? ? ?CURRENT MEDICATIONS:  ? ?Current Outpatient Medications  ?Medication Sig Dispense Refill  ? amLODipine (NORVASC) 10 MG tablet Take 1 tablet (10 mg total) by mouth daily. 90 tablet 3  ? atorvastatin (LIPITOR) 80 MG tablet Take 1 tablet (80 mg total) by mouth daily. 90 tablet 3  ? carvedilol (COREG) 12.5 MG tablet Take 1 tablet (12.5 mg total) by mouth 2 (two) times daily. 180 tablet 3  ? cloNIDine (CATAPRES) 0.1 MG tablet Take 1 tablet (0.1 mg total) by mouth 2 (two) times daily. 180 tablet 3  ? clopidogrel (PLAVIX) 75 MG tablet TAKE 1 TABLET BY MOUTH EVERY DAY 90 tablet 3  ? Docusate Calcium (STOOL SOFTENER PO) Take by mouth daily.    ? hydrALAZINE (APRESOLINE) 25 MG tablet Take 1 tablet (25 mg total) by mouth 3 (three) times daily as needed. Take for SBP >160 180 tablet 3  ?  lisinopril (ZESTRIL) 20 MG tablet TAKE 1 TABLET BY MOUTH EVERY DAY 90 tablet 1  ? loratadine (CLARITIN) 10 MG tablet Take 10 mg by mouth daily.    ? Multiple Vitamins-Minerals (CENTRUM SILVER ADULT 50+ PO) Take 1 tablet by mouth daily.    ? ?No current facility-administered medications for this visit.  ? ? ?REVIEW OF SYSTEMS:  ? ?'[X]'$  denotes positive finding, '[ ]'$  denotes negative finding ?Cardiac  Comments:  ?Chest pain or chest pressure:    ?Shortness of breath upon exertion:    ?Short of breath when lying flat:    ?Irregular heart rhythm:    ?    ?Vascular    ?Pain in calf, thigh, or hip brought on by ambulation:    ?Pain in feet at night that wakes you up from your sleep:     ?Blood clot in your veins:    ?Leg swelling:     ?    ?Pulmonary    ?Oxygen at home:    ?Productive cough:     ?Wheezing:     ?    ?Neurologic    ?Sudden weakness in arms or legs:     ?Sudden  numbness in arms or legs:     ?Sudden onset of difficulty speaking or slurred speech:    ?Temporary loss of vision in one eye:     ?Problems with dizziness:     ?    ?Gastrointestinal    ?Blood in stool:     ?Vomited blood:     ?    ?Genitourinary    ?Burning when urinating:     ?Blood in urine:    ?    ?Psychiatric    ?Major depression:     ?    ?Hematologic    ?Bleeding problems:    ?Problems with blood clotting too easily:    ?    ?Skin    ?Rashes or ulcers:    ?    ?Constitutional    ?Fever or chills:    ? ? ?PHYSICAL EXAM:  ? ?There were no vitals filed for this visit. ? ?GENERAL: The patient is a well-nourished male, in no acute distress. The vital signs are documented above. ?CARDIAC: There is a regular rate and rhythm.  ?PULMONARY: Non-labored respirations ?MUSCULOSKELETAL: There are no major deformities or cyanosis. ?NEUROLOGIC: No focal weakness or paresthesias are detected. ?SKIN: There are no ulcers or rashes noted. ?PSYCHIATRIC: The patient has a normal affect. ? ?STUDIES:  ? ?I have Reviewed the following carotid duplex: ?Right Carotid: Velocities in the right ICA are consistent with a 1-39%  ?stenosis.  ?               The ECA appears >50% stenosed.  ? ?Left Carotid: Patent carotid endarterectomy with velocity in the left ICA  ?              consistent with a 1-39% stenosis.  ? ?Vertebrals:  Left vertebral artery demonstrates antegrade flow. A segment  ?of the  ?             right vertebral artery suggest antegrade flow, abnormal  ?vertebral  ?             artery waveforms more proximal suggests occlusion / near  ?occlusion.  ?Subclavians: Normal flow hemodynamics were seen in bilateral subclavian  ?             arteries.  ?MEDICAL ISSUES:  ? ?Carotid: The patient is status post  left carotid endarterectomy.  The endarterectomy site remains widely patent.  He remains asymptomatic.  He will return in 1 year for surveillance imaging. ? ? ? ?Annamarie Major, IV, MD, FACS ?[facility]  [place] ?Tel [phone] ?Pager [phone]  ?

## 2021-11-11 DIAGNOSIS — Z20822 Contact with and (suspected) exposure to covid-19: Secondary | ICD-10-CM | POA: Diagnosis not present

## 2021-11-12 DIAGNOSIS — Z20822 Contact with and (suspected) exposure to covid-19: Secondary | ICD-10-CM | POA: Diagnosis not present

## 2021-11-13 DIAGNOSIS — Z20822 Contact with and (suspected) exposure to covid-19: Secondary | ICD-10-CM | POA: Diagnosis not present

## 2021-11-18 DIAGNOSIS — Z20828 Contact with and (suspected) exposure to other viral communicable diseases: Secondary | ICD-10-CM | POA: Diagnosis not present

## 2021-11-20 ENCOUNTER — Encounter: Payer: Self-pay | Admitting: Family Medicine

## 2021-11-20 ENCOUNTER — Ambulatory Visit (INDEPENDENT_AMBULATORY_CARE_PROVIDER_SITE_OTHER): Payer: Medicare Other | Admitting: Family Medicine

## 2021-11-20 VITALS — BP 152/78 | HR 59 | Temp 97.7°F | Ht 62.75 in | Wt 202.0 lb

## 2021-11-20 DIAGNOSIS — D649 Anemia, unspecified: Secondary | ICD-10-CM

## 2021-11-20 DIAGNOSIS — I739 Peripheral vascular disease, unspecified: Secondary | ICD-10-CM | POA: Diagnosis not present

## 2021-11-20 DIAGNOSIS — Z23 Encounter for immunization: Secondary | ICD-10-CM

## 2021-11-20 DIAGNOSIS — D472 Monoclonal gammopathy: Secondary | ICD-10-CM

## 2021-11-20 DIAGNOSIS — I10 Essential (primary) hypertension: Secondary | ICD-10-CM | POA: Diagnosis not present

## 2021-11-20 DIAGNOSIS — N1832 Chronic kidney disease, stage 3b: Secondary | ICD-10-CM | POA: Diagnosis not present

## 2021-11-20 DIAGNOSIS — I6523 Occlusion and stenosis of bilateral carotid arteries: Secondary | ICD-10-CM

## 2021-11-20 DIAGNOSIS — E785 Hyperlipidemia, unspecified: Secondary | ICD-10-CM

## 2021-11-20 DIAGNOSIS — Z7189 Other specified counseling: Secondary | ICD-10-CM | POA: Diagnosis not present

## 2021-11-20 DIAGNOSIS — Z8673 Personal history of transient ischemic attack (TIA), and cerebral infarction without residual deficits: Secondary | ICD-10-CM | POA: Diagnosis not present

## 2021-11-20 DIAGNOSIS — Z0000 Encounter for general adult medical examination without abnormal findings: Secondary | ICD-10-CM | POA: Diagnosis not present

## 2021-11-20 LAB — CBC WITH DIFFERENTIAL/PLATELET
Basophils Absolute: 0.1 10*3/uL (ref 0.0–0.1)
Basophils Relative: 0.7 % (ref 0.0–3.0)
Eosinophils Absolute: 0.1 10*3/uL (ref 0.0–0.7)
Eosinophils Relative: 1.2 % (ref 0.0–5.0)
HCT: 41.4 % (ref 39.0–52.0)
Hemoglobin: 14.2 g/dL (ref 13.0–17.0)
Lymphocytes Relative: 47 % — ABNORMAL HIGH (ref 12.0–46.0)
Lymphs Abs: 4.4 10*3/uL — ABNORMAL HIGH (ref 0.7–4.0)
MCHC: 34.4 g/dL (ref 30.0–36.0)
MCV: 92.9 fl (ref 78.0–100.0)
Monocytes Absolute: 0.5 10*3/uL (ref 0.1–1.0)
Monocytes Relative: 5 % (ref 3.0–12.0)
Neutro Abs: 4.3 10*3/uL (ref 1.4–7.7)
Neutrophils Relative %: 46.1 % (ref 43.0–77.0)
Platelets: 267 10*3/uL (ref 150.0–400.0)
RBC: 4.45 Mil/uL (ref 4.22–5.81)
RDW: 13.5 % (ref 11.5–15.5)
WBC: 9.4 10*3/uL (ref 4.0–10.5)

## 2021-11-20 LAB — COMPREHENSIVE METABOLIC PANEL
ALT: 31 U/L (ref 0–53)
AST: 25 U/L (ref 0–37)
Albumin: 4.6 g/dL (ref 3.5–5.2)
Alkaline Phosphatase: 68 U/L (ref 39–117)
BUN: 20 mg/dL (ref 6–23)
CO2: 27 mEq/L (ref 19–32)
Calcium: 9.2 mg/dL (ref 8.4–10.5)
Chloride: 105 mEq/L (ref 96–112)
Creatinine, Ser: 1.53 mg/dL — ABNORMAL HIGH (ref 0.40–1.50)
GFR: 42.05 mL/min — ABNORMAL LOW (ref >60.00)
Glucose, Bld: 96 mg/dL (ref 70–99)
Potassium: 4.5 mEq/L (ref 3.5–5.1)
Sodium: 142 mEq/L (ref 135–145)
Total Bilirubin: 0.7 mg/dL (ref 0.2–1.2)
Total Protein: 7.2 g/dL (ref 6.0–8.3)

## 2021-11-20 LAB — MICROALBUMIN / CREATININE URINE RATIO
Creatinine,U: 37 mg/dL
Microalb Creat Ratio: 30.6 mg/g — ABNORMAL HIGH (ref 0.0–30.0)
Microalb, Ur: 11.3 mg/dL — ABNORMAL HIGH (ref 0.0–1.9)

## 2021-11-20 LAB — LIPID PANEL
Cholesterol: 167 mg/dL (ref 0–200)
HDL: 60.2 mg/dL (ref >39.00)
LDL Cholesterol: 81 mg/dL (ref 0–99)
NonHDL: 107.09
Total CHOL/HDL Ratio: 3
Triglycerides: 130 mg/dL (ref 0.0–149.0)
VLDL: 26 mg/dL (ref 0.0–40.0)

## 2021-11-20 LAB — VITAMIN D 25 HYDROXY (VIT D DEFICIENCY, FRACTURES): VITD: 47.79 ng/mL (ref 30.00–100.00)

## 2021-11-20 NOTE — Assessment & Plan Note (Signed)

## 2021-11-20 NOTE — Progress Notes (Signed)
? ? Patient ID: Craig Avery, male    DOB: 09/14/1940, 81 y.o.   MRN: [id number] ? ?This visit was conducted in person. ? ?BP (!) 152/78 (BP Location: Right Arm, Cuff Size: Large)   Pulse (!) 59   Temp 97.7 ?F (36.5 ?C) (Temporal)   Ht 5' 2.75" (1.594 m)   Wt 202 lb (91.6 kg)   SpO2 96%   BMI 36.07 kg/m?   ?160/70 on retesting ? ?CC: AMW ?Subjective:  ? ?HPI: ?Craig Avery is a 81 y.o. male presenting on 11/20/2021 for Medicare Wellness ? ? ?Did not see health advisor.  ? ?Hearing Screening  ? '500Hz'$  '1000Hz'$  '2000Hz'$  '4000Hz'$   ?Right ear '25 25 20 '$ 0  ?Left ear 40 '20 20 25  '$ ? ?Vision Screening  ? Right eye Left eye Both eyes  ?Without correction     ?With correction '20/30 20/70 20/30 '$  ?  ?Personnel officer Visit from 11/20/2021 in [place] at [facility]  ?PHQ-2 Total Score 0  ? ?  ? L eye vision difficulty noted - in h/o L homonymous hemianopsia from CVA 05/2012 - but this had previously resolved. New glasses too. He says L eye is weak eye and regularly sees eye doctor.  ? ?  11/20/2021  ? 11:02 AM 11/18/2020  ?  9:32 AM 11/14/2019  ? 11:09 AM 11/10/2018  ?  9:35 AM 01/04/2017  ?  9:16 AM  ?Fall Risk   ?Falls in the past year? 0 0 0 0 No  ?Number falls in past yr:   0    ?Injury with Fall?   0    ?Risk for fall due to :   Medication side effect    ?Follow up   Falls evaluation completed;Falls prevention discussed    ? ?Did have coffee with cream/sugar 2 hours ago. Hasn't taken BP meds yet.  ? ?COVID infection 06/2021 - treated with molnupiravir with full resolution of symptoms. Notes he doesn't get hungry since COVID illness 2020.  ? ?[facility]es [facility] ([facility]) s/p L CEA for asymptomatic stenosis 2014.  ?Regularly sees cardiology as well (Dr Rockey Situ).  ? ?Daughter fills pill box every few weeks.  ? ?Preventative: ?COLONOSCOPY WITH PROPOFOL 06/24/2015 3 polyps, one with high grade dysplasia, rpt 3 mo; [name], MD ?COLONOSCOPY WITH PROPOFOL 10/14/2015 hyperplastic polyp; f/u PRN; [name], MD - aged out.   ?Prostate cancer screening - aged out  ?Lung cancer screening - not eligible ?Needle phobia  ?Flu shot - declines  ?COVID vaccine - 10/2019, 11/2019, booster x1 (unsure date)  ?[id number] today  ?Tdap 11/2014  ?Shingrix - declines  ?Advanced directives - doesn't have living will. Does not want prolonged life support if terminal condition - doesn't want to be "burden". Would want daughters to be HCPOA. Packet provided previously.  ?Seat belt use discussed.  ?Sunscreen use discussed. Recent BCC removed R scalp [facility]. Sees derm yearly.  ?Ex smoker - quit 1995  ?Alcohol - a few drinks at a time when he goes out a few times a week, doesn't drink alone at home  ?Dentist - full dentures but doesn't use  ?Eye exam yearly  ?Bowels - no diarrhea/constipation  ?Bladder - no incontinence  ? ?Caffeine: light coffee in am, working on cutting back ?Lives alone. Widower (08/2017). Daughter next door. Other children nearby. 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   ? ?Relevant past medical, surgical, family and social history reviewed and updated as indicated. Interim medical history since our last visit reviewed. ?Allergies and medications reviewed and updated. ?Outpatient Medications Prior to Visit  ?Medication Sig Dispense Refill  ? amLODipine (NORVASC) 10 MG tablet Take 1 tablet (10 mg total) by mouth daily. 90 tablet 3  ? atorvastatin (LIPITOR) 80 MG tablet Take 1 tablet (80 mg total) by mouth daily. 90 tablet 3  ? carvedilol (COREG) 12.5 MG tablet Take 1 tablet (12.5 mg total) by mouth 2 (two) times daily. 180 tablet 3  ? cloNIDine (CATAPRES) 0.1 MG tablet Take 1 tablet (0.1 mg total) by mouth 2 (two) times daily. 180 tablet 3  ? clopidogrel (PLAVIX) 75 MG tablet TAKE 1 TABLET BY MOUTH EVERY DAY 90 tablet 3  ? Docusate Calcium (STOOL SOFTENER PO) Take by mouth daily.    ? hydrALAZINE  (APRESOLINE) 25 MG tablet Take 1 tablet (25 mg total) by mouth 3 (three) times daily as needed. Take for SBP >160 180 tablet 3  ? lisinopril (ZESTRIL) 20 MG tablet TAKE 1 TABLET BY MOUTH EVERY DAY 90 tablet 1  ? loratadine (CLARITIN) 10 MG tablet Take 10 mg by mouth daily.    ? Multiple Vitamins-Minerals (CENTRUM SILVER ADULT 50+ PO) Take 1 tablet by mouth daily.    ? ?No facility-administered medications prior to visit.  ?  ? ?Per HPI unless specifically indicated in ROS section below ?Review of Systems ? ?Objective:  ?BP (!) 152/78 (BP Location: Right Arm, Cuff Size: Large)   Pulse (!) 59   Temp 97.7 ?F (36.5 ?C) (Temporal)   Ht 5' 2.75" (1.594 m)   Wt 202 lb (91.6 kg)   SpO2 96%   BMI 36.07 kg/m?   ?Wt Readings from Last 3 Encounters:  ?11/20/21 202 lb (91.6 kg)  ?11/09/21 202 lb (91.6 kg)  ?06/30/21 199 lb (90.3 kg)  ?  ?  ?Physical Exam ?Vitals and nursing note reviewed.  ?Constitutional:   ?   General: He is not in acute distress. ?   Appearance: Normal appearance. He is well-developed. He is not ill-appearing.  ?HENT:  ?   Head: Normocephalic and atraumatic.  ?   Right Ear: Hearing, tympanic membrane, ear canal and external ear normal.  ?   Left Ear: Hearing, tympanic membrane, ear canal and external ear normal.  ?Eyes:  ?   General: No scleral icterus. ?   Extraocular Movements: Extraocular movements intact.  ?   Conjunctiva/sclera: Conjunctivae normal.  ?   Pupils: Pupils are equal, round, and reactive to light.  ?Neck:  ?   Thyroid: No thyroid mass or thyromegaly.  ?   Vascular: Carotid bruit present.  ?Cardiovascular:  ?   Rate and Rhythm: Normal rate and regular rhythm.  ?   Pulses: Normal pulses.     ?     Radial pulses are 2+ on the right side and 2+ on the left side.  ?   Heart sounds: Normal heart sounds. No murmur heard. ?Pulmonary:  ?   Effort: Pulmonary effort is normal. No respiratory distress.  ?   Breath sounds: Normal breath sounds. No wheezing, rhonchi or rales.  ?Abdominal:  ?    General: Bowel sounds are normal. There is no distension.  ?   Palpations: Abdomen is soft. There is no mass.  ?   Tenderness: There is no abdominal tenderness. There is no guarding or rebound.  ?  Hernia: No hernia is present.  ?Musculoskeletal:     ?   General: Normal range of motion.  ?   Cervical back: Normal range of motion and neck supple.  ?   Right lower leg: No edema.  ?   Left lower leg: No edema.  ?Lymphadenopathy:  ?   Cervical: No cervical adenopathy.  ?Skin: ?   General: Skin is warm and dry.  ?   Findings: No rash.  ?Neurological:  ?   General: No focal deficit present.  ?   Mental Status: He is alert and oriented to person, place, and time.  ?   Comments:  ?Recall 3/3 ?Calculation 5/5 DLROW  ?Psychiatric:     ?   Mood and Affect: Mood normal.     ?   Behavior: Behavior normal.     ?   Thought Content: Thought content normal.     ?   Judgment: Judgment normal.  ? ?   ?Results for orders placed or performed in visit on 11/20/21  ?Lipid panel  ?Result Value Ref Range  ? Cholesterol 167 0 - 200 mg/dL  ? Triglycerides 130.0 0.0 - 149.0 mg/dL  ? HDL 60.20 >39.00 mg/dL  ? VLDL 26.0 0.0 - 40.0 mg/dL  ? LDL Cholesterol 81 0 - 99 mg/dL  ? Total CHOL/HDL Ratio 3   ? NonHDL 107.09   ?Comprehensive metabolic panel  ?Result Value Ref Range  ? Sodium 142 135 - 145 mEq/L  ? Potassium 4.5 3.5 - 5.1 mEq/L  ? Chloride 105 96 - 112 mEq/L  ? CO2 27 19 - 32 mEq/L  ? Glucose, Bld 96 70 - 99 mg/dL  ? BUN 20 6 - 23 mg/dL  ? Creatinine, Ser 1.53 (H) 0.40 - 1.50 mg/dL  ? Total Bilirubin 0.7 0.2 - 1.2 mg/dL  ? Alkaline Phosphatase 68 39 - 117 U/L  ? AST 25 0 - 37 U/L  ? ALT 31 0 - 53 U/L  ? Total Protein 7.2 6.0 - 8.3 g/dL  ? Albumin 4.6 3.5 - 5.2 g/dL  ? GFR 42.05 (L) >60.00 mL/min  ? Calcium 9.2 8.4 - 10.5 mg/dL  ?CBC with Differential/Platelet  ?Result Value Ref Range  ? WBC 9.4 4.0 - 10.5 K/uL  ? RBC 4.45 4.22 - 5.81 Mil/uL  ? Hemoglobin 14.2 13.0 - 17.0 g/dL  ? HCT 41.4 39.0 - 52.0 %  ? MCV 92.9 78.0 - 100.0 fl  ? MCHC  34.4 30.0 - 36.0 g/dL  ? RDW 13.5 11.5 - 15.5 %  ? Platelets 267.0 150.0 - 400.0 K/uL  ? Neutrophils Relative % 46.1 43.0 - 77.0 %  ? Lymphocytes Relative 47.0 (H) 12.0 - 46.0 %  ? Monocytes Relative 5.0 3.0 - 12.0 %  ? Eosinophil

## 2021-11-20 NOTE — Patient Instructions (Addendum)
Labs today.  ?[id number] today.  ?Try to decrease night time snacking.  ?Blood pressure was a bit high today - but you haven't taken your BP meds yet this morning. Check blood pressures after you take you medicines to ensure good control at those times.  ?You are doing well today ?Return as needed or in 6 months for follow up visit.  ? ?Health Maintenance After Age 83 ?After age 59, you are at a higher risk for certain long-term diseases and infections as well as injuries from falls. Falls are a major cause of broken bones and head injuries in people who are older than age 4. Getting regular preventive care can help to keep you healthy and well. Preventive care includes getting regular testing and making lifestyle changes as recommended by your health care provider. Talk with your health care provider about: ?Which screenings and tests you should have. A screening is a test that checks for a disease when you have no symptoms. ?A diet and exercise plan that is right for you. ?What should I know about screenings and tests to prevent falls? ?Screening and testing are the best ways to find a health problem early. Early diagnosis and treatment give you the best chance of managing medical conditions that are common after age 37. Certain conditions and lifestyle choices may make you more likely to have a fall. Your health care provider may recommend: ?Regular vision checks. Poor vision and conditions such as cataracts can make you more likely to have a fall. If you wear glasses, make sure to get your prescription updated if your vision changes. ?Medicine review. Work with your health care provider to regularly review all of the medicines you are taking, including over-the-counter medicines. Ask your health care provider about any side effects that may make you more likely to have a fall. Tell your health care provider if any medicines that you take make you feel dizzy or sleepy. ?Strength and balance checks. Your health  care provider may recommend certain tests to check your strength and balance while standing, walking, or changing positions. ?Foot health exam. Foot pain and numbness, as well as not wearing proper footwear, can make you more likely to have a fall. ?Screenings, including: ?Osteoporosis screening. Osteoporosis is a condition that causes the bones to get weaker and break more easily. ?Blood pressure screening. Blood pressure changes and medicines to control blood pressure can make you feel dizzy. ?Depression screening. You may be more likely to have a fall if you have a fear of falling, feel depressed, or feel unable to do activities that you used to do. ?Alcohol use screening. Using too much alcohol can affect your balance and may make you more likely to have a fall. ?Follow these instructions at home: ?Lifestyle ?Do not drink alcohol if: ?Your health care provider tells you not to drink. ?If you drink alcohol: ?Limit how much you have to: ?0-1 drink a day for women. ?0-2 drinks a day for men. ?Know how much alcohol is in your drink. In the [place] one drink equals one 12 oz bottle of beer (355 mL), one 5 oz glass of wine (148 mL), or one 1? oz glass of hard liquor (44 mL). ?Do not use any products that contain nicotine or tobacco. These products include cigarettes, chewing tobacco, and vaping devices, such as e-cigarettes. If you need help quitting, ask your health care provider. ?Activity ? ?Follow a regular exercise program to stay fit. This will help you maintain your balance. Ask  your health care provider what types of exercise are appropriate for you. ?If you need a cane or walker, use it as recommended by your health care provider. ?Wear supportive shoes that have nonskid soles. ?Safety ? ?Remove any tripping hazards, such as rugs, cords, and clutter. ?Install safety equipment such as grab bars in bathrooms and safety rails on stairs. ?Keep rooms and walkways well-lit. ?General instructions ?Talk with your  health care provider about your risks for falling. Tell your health care provider if: ?You fall. Be sure to tell your health care provider about all falls, even ones that seem minor. ?You feel dizzy, tiredness (fatigue), or off-balance. ?Take over-the-counter and prescription medicines only as told by your health care provider. These include supplements. ?Eat a healthy diet and maintain a healthy weight. A healthy diet includes low-fat dairy products, low-fat (lean) meats, and fiber from whole grains, beans, and lots of fruits and vegetables. ?Stay current with your vaccines. ?Schedule regular health, dental, and eye exams. ?Summary ?Having a healthy lifestyle and getting preventive care can help to protect your health and wellness after age 71. ?Screening and testing are the best way to find a health problem early and help you avoid having a fall. Early diagnosis and treatment give you the best chance for managing medical conditions that are more common for people who are older than age 59. ?Falls are a major cause of broken bones and head injuries in people who are older than age 37. Take precautions to prevent a fall at home. ?Work with your health care provider to learn what changes you can make to improve your health and wellness and to prevent falls. ?This information is not intended to replace advice given to you by your health care provider. Make sure you discuss any questions you have with your health care provider. ?Document Revised: 11/17/2020 Document Reviewed: 11/17/2020 ?Elsevier Patient Education ? [place]. ? ?

## 2021-11-21 ENCOUNTER — Encounter: Payer: Self-pay | Admitting: Family Medicine

## 2021-11-21 NOTE — Assessment & Plan Note (Signed)
This is followed by [facility].  ?H/o L CEA ?

## 2021-11-21 NOTE — Assessment & Plan Note (Addendum)
Encouraged healthy diet and lifestyle to affect sustainable weight loss. Recommend limiting night tine snacking ?

## 2021-11-21 NOTE — Assessment & Plan Note (Signed)
Chronic, stable on full dose atorvastatin - continue this. ?The ASCVD Risk score (Arnett DK, et al., 2019) failed to calculate for the following reasons: ?  The 2019 ASCVD risk score is only valid for ages 54 to 54  ?

## 2021-11-21 NOTE — Assessment & Plan Note (Signed)
Update labs.  

## 2021-11-21 NOTE — Assessment & Plan Note (Addendum)
Update labs today.  ?Consider SGLT2i.  ?

## 2021-11-21 NOTE — Assessment & Plan Note (Signed)
Advanced directives - doesn't have living will. Does not want prolonged life support if terminal condition - doesn't want to be "burden". Would want daughters to be HCPOA. Packet provided previously.  ?

## 2021-11-21 NOTE — Assessment & Plan Note (Signed)
Continue aspirin ,plavix, statin.  

## 2021-11-21 NOTE — Assessment & Plan Note (Signed)
Chronic, elevated - he has not taken BP meds yet this morning.  ?I asked him to continue monitoring blood pressures at home and update [place] with readings. Specifically discussed checking BP after he takes medication.  ?

## 2021-11-21 NOTE — Assessment & Plan Note (Signed)
Continue [facility] f/u, continue plavix and statin  ?

## 2021-11-24 ENCOUNTER — Telehealth: Payer: Self-pay | Admitting: Family Medicine

## 2021-11-24 NOTE — Telephone Encounter (Signed)
Left message for patient to call back and schedule Medicare Annual Wellness Visit (AWV) to be completed by video or phone. ? ? ? ?Last AWV: 11/18/2020 ? ? ? ?Please schedule at anytime with  ?[facility] [employer]    ? ? ? ?45 minute appointment ? ? ? ?Any questions, please contact me at [phone] ?

## 2021-11-25 ENCOUNTER — Encounter: Payer: Self-pay | Admitting: Family Medicine

## 2021-11-26 MED ORDER — DAPAGLIFLOZIN PROPANEDIOL 5 MG PO TABS: 5.0000 mg | ORAL_TABLET | Freq: Every day | ORAL | 3 refills | Status: DC

## 2021-12-16 DIAGNOSIS — Z85828 Personal history of other malignant neoplasm of skin: Secondary | ICD-10-CM | POA: Diagnosis not present

## 2021-12-16 DIAGNOSIS — D2262 Melanocytic nevi of left upper limb, including shoulder: Secondary | ICD-10-CM | POA: Diagnosis not present

## 2021-12-16 DIAGNOSIS — D2261 Melanocytic nevi of right upper limb, including shoulder: Secondary | ICD-10-CM | POA: Diagnosis not present

## 2021-12-16 DIAGNOSIS — D225 Melanocytic nevi of trunk: Secondary | ICD-10-CM | POA: Diagnosis not present

## 2021-12-16 DIAGNOSIS — L570 Actinic keratosis: Secondary | ICD-10-CM | POA: Diagnosis not present

## 2022-01-26 NOTE — Progress Notes (Unsigned)
Patient ID: Craig Avery, male   DOB: 1940-10-08, 81 y.o.   MRN: [id number] Cardiology Office Note  Date:  01/27/2022   ID:  Craig Avery, DOB August 03, 1940, MRN [id number]  PCP:  [name], MD   Chief Complaint  Patient presents with   6 month follow up     Patient c/o fluttering in chest with rapid heart beats at rest, symptoms last about 1 minute, has these spells weekly. Medications reviewed by the patient verbally.      HPI:  81 year-old gentleman with history of hypertension, long history of poorly controlled peripheral vascular disease, Carotid endarterectomy on the left July 2014, smoking history , 20 to 39 yr  hospitalization x2 at [facility], L sided paresthesias, Found to have stroke (acute right PCA distribution ischemic infarction involving the right occipital region and right thalamus along with severe R PCA), returned a few days later to ER hypertensive to >502 systolic,  With medication management for blood pressure Chronic renal failure creatinine 1.5 He presents for follow-up of his PAD, hypertension  Last seen in clinic by myself October 2022 Presents by himself, no family with him Wife does his medication pillbox Reports he often will miss the morning doses, try to get the evening doses at least Feels well otherwise no new complaints apart from fluttering as detailed below Sedentary Rare fluttering/palpitations, 1 minutes, once a week or less No chest pain on exertion  5/23: carotid u/s Right carotid: Velocities in the right ICA are consistent with a 1-39%  stenosis. The ECA appears >50% stenosed.  Left Carotid: Patent carotid endarterectomy with velocity in the left ICA   consistent with a 1-39% stenosis.  Labs reviewed: CR 1.5 Total cholesterol 160 range, reports he is missing some of his Lipitor doses  sedentary, no regular walking program, sits in his chair much of the day  EKG personally reviewed by myself on todays visit sr rate 58 bpm, ols  inferior MI  Other past medical history reviewed Chronic back pain  LE arterial Right: 50-74% stenosis noted in the common femoral artery. 30-49% stenosis noted in the superficial femoral artery. Left: 30-49% stenosis noted in the popliteal artery.  Initial MRI/MRA: 1. Small acute infarcts in the right hippocampus and right occipital lobe, implicating right PCA territory ischemia. No mass effect or hemorrhage.   2. Chronic small vessel ischemia in the left basal ganglia and possibly also the medial left thalamus.   1. High-grade stenosis or focal near occlusion of the right PCA distal P2 segment with attenuated more distal right PCA flow.   2. Moderate to severe focal stenosis of the left PCA P2 segment.   3. 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   4. Mild to moderate anterior circulation atherosclerosis, most pronounced in the MCA M1 segments.   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  PSH:    Past Surgical History:  Procedure Laterality Date   CAROTID ENDARTERECTOMY Left 07-21-12   cea   CATARACT EXTRACTION  2004   bilateral   COLONOSCOPY WITH PROPOFOL N/A 06/24/2015   3 polyps, one with high grade dysplasia, rpt 3 mo; [name], MD   COLONOSCOPY WITH PROPOFOL N/A 10/14/2015   hyperplastic polyp; f/u PRN; [name], MD   ENDARTERECTOMY  07/21/2012   Left CEA; Surgeon: [name], MD   EYE SURGERY     MELANOMA EXCISION  12/30/2016   right side of head above ear   PATCH ANGIOPLASTY  07/21/2012   Procedure: PATCH ANGIOPLASTY;  Surgeon: [name], MD;  Location: [place] OR;  Service: Vascular;  Laterality: Left;  Using 1 cm x 6 cm Vascu Guard patch    TEE WITHOUT  CARDIOVERSION  05/22/2012   Procedure: TRANSESOPHAGEAL ECHOCARDIOGRAM (TEE);  Surgeon: [name], MD;  Location: [facility] ENDOSCOPY;  Service: Cardiovascular;  Laterality: N/A;   WISDOM TOOTH EXTRACTION      Current Outpatient Medications  Medication Sig Dispense Refill   amLODipine (NORVASC) 10 MG tablet Take 1 tablet (10 mg total) by mouth daily. 90 tablet 3   atorvastatin (LIPITOR) 80 MG tablet Take 1 tablet (80 mg total) by mouth daily. 90 tablet 3   carvedilol (COREG) 12.5 MG tablet Take 1 tablet (12.5 mg total) by mouth 2 (two) times daily. 180 tablet 3   cloNIDine (CATAPRES) 0.1 MG tablet Take 1 tablet (0.1 mg total) by mouth 2 (two) times daily. 180 tablet 3   clopidogrel (PLAVIX) 75 MG tablet TAKE 1 TABLET BY MOUTH EVERY DAY 90 tablet 3   dapagliflozin propanediol (FARXIGA) 5 MG TABS tablet Take 1 tablet (5 mg total) by mouth daily before breakfast. 30 tablet 3   Docusate Calcium (STOOL SOFTENER PO) Take by mouth daily.     hydrALAZINE (APRESOLINE) 25 MG tablet Take 1 tablet (25 mg total) by mouth 3 (three) times daily as needed. Take for SBP >160 180 tablet 3   lisinopril (ZESTRIL) 20 MG tablet TAKE 1 TABLET BY MOUTH EVERY DAY 90 tablet 1   loratadine (CLARITIN) 10 MG tablet Take 10 mg by mouth daily.     Multiple Vitamins-Minerals (CENTRUM SILVER ADULT 50+ PO) Take 1 tablet by mouth daily.     No current facility-administered medications for this visit.     Allergies:   Patient has no known allergies.   Social History:  The patient  reports that he quit smoking about 27 years ago. His smoking use included cigarettes. He has a 49.50 pack-year smoking history. He has never used smokeless tobacco. He reports current alcohol use of about 8.0 - 10.0 standard drinks of alcohol per week. He reports that he does not use drugs.   Family History:   family history includes Aneurysm (age of onset: 62) in his paternal uncle; CAD (age of onset: 28) in his brother; Cirrhosis (age of onset:  50) in his father; Heart attack in his brother; Heart disease in his brother and daughter; Hyperlipidemia in his daughter; Hypertension in his mother.    Review of Systems: Review of Systems  Constitutional: Negative.   Respiratory: Negative.    Cardiovascular: Negative.   Gastrointestinal: Negative.   Musculoskeletal: Negative.   Neurological: Negative.   Psychiatric/Behavioral: Negative.    All other systems reviewed and are negative.   PHYSICAL EXAM: VS:  BP (!) 142/60 (BP Location: Left Arm, Patient Position: Sitting, Cuff Size: Normal)   Pulse (!) 58   Ht '5\' 3"'$  (1.6 m)   Wt 204 lb 2 oz (92.6 kg)   SpO2 98%   BMI 36.16 kg/m  , BMI Body mass index is 36.16 kg/m. Constitutional:  oriented to person, place, and time. No distress.  HENT:  Head: Grossly normal Eyes:  no discharge. No scleral icterus.  Neck: No JVD, no carotid bruits  Cardiovascular:  Regular rate and rhythm, no murmurs appreciated Pulmonary/Chest: Clear to auscultation bilaterally, no wheezes or rails Abdominal: Soft.  no distension.  no tenderness.  Musculoskeletal: Normal range of motion Neurological:  normal muscle tone. Coordination normal. No atrophy Skin: Skin warm and dry Psychiatric: normal affect, pleasant  Recent Labs: 11/20/2021: ALT 31; BUN 20; Creatinine, Ser 1.53; Hemoglobin 14.2; Platelets 267.0; Potassium 4.5; Sodium 142    Lipid Panel Lab Results  Component Value Date   CHOL 167 11/20/2021   HDL 60.20 11/20/2021   LDLCALC 81 11/20/2021   TRIG 130.0 11/20/2021    Wt Readings from Last 3 Encounters:  01/27/22 204 lb 2 oz (92.6 kg)  11/20/21 202 lb (91.6 kg)  11/09/21 202 lb (91.6 kg)     ASSESSMENT AND PLAN:  Essential hypertension -  Reports missing doses at times Stressed importance of following his pillbox medications Recommend he move some of his evening medications to the morning so he does not miss them  Hyperlipidemia Reports missing some of his Lipitor, recommend he  move the Lipitor to the morning so he does not miss the dose  Carotid stenosis, bilateral Ultrasound results reviewed with him in detail.  Stable mild disease bilaterally,  No further intervention needed  CKD (chronic kidney disease) stage 3, GFR 30-59 ml/min Stable renal function, creatinine 1.5 On lisinopril Avoid NSAIDs Stressed importance of aggressive blood pressure control  Paroxysmal tachycardia A Zio monitor ordered, unable to exclude atrial fibrillation flutter    Total encounter time more than 25 minutes  Greater than 50% was spent in counseling and coordination of care with the patient    No orders of the defined types were placed in this encounter.     Signed, Esmond Plants, M.D., Ph.D. 01/27/2022  [place], [place]

## 2022-01-27 ENCOUNTER — Ambulatory Visit (INDEPENDENT_AMBULATORY_CARE_PROVIDER_SITE_OTHER): Payer: Medicare Other | Admitting: Cardiovascular Disease

## 2022-01-27 ENCOUNTER — Encounter: Payer: Self-pay | Admitting: Cardiovascular Disease

## 2022-01-27 ENCOUNTER — Ambulatory Visit (INDEPENDENT_AMBULATORY_CARE_PROVIDER_SITE_OTHER): Payer: Medicare Other

## 2022-01-27 VITALS — BP 142/60 | HR 58 | Ht 63.0 in | Wt 204.1 lb

## 2022-01-27 DIAGNOSIS — I6523 Occlusion and stenosis of bilateral carotid arteries: Secondary | ICD-10-CM

## 2022-01-27 DIAGNOSIS — I10 Essential (primary) hypertension: Secondary | ICD-10-CM

## 2022-01-27 DIAGNOSIS — I479 Paroxysmal tachycardia, unspecified: Secondary | ICD-10-CM

## 2022-01-27 DIAGNOSIS — I739 Peripheral vascular disease, unspecified: Secondary | ICD-10-CM

## 2022-01-27 DIAGNOSIS — N1832 Chronic kidney disease, stage 3b: Secondary | ICD-10-CM

## 2022-01-27 DIAGNOSIS — E785 Hyperlipidemia, unspecified: Secondary | ICD-10-CM

## 2022-01-27 MED ORDER — ATORVASTATIN CALCIUM 80 MG PO TABS: 80.0000 mg | ORAL_TABLET | Freq: Every day | ORAL | 3 refills | Status: DC

## 2022-01-27 MED ORDER — AMLODIPINE BESYLATE 10 MG PO TABS: 10.0000 mg | ORAL_TABLET | Freq: Every day | ORAL | 3 refills | Status: DC

## 2022-01-27 MED ORDER — HYDRALAZINE HCL 25 MG PO TABS: 25.0000 mg | ORAL_TABLET | Freq: Three times a day (TID) | ORAL | 3 refills | Status: DC | PRN

## 2022-01-27 MED ORDER — CLONIDINE HCL 0.1 MG PO TABS: 0.1000 mg | ORAL_TABLET | Freq: Two times a day (BID) | ORAL | 3 refills | Status: DC

## 2022-01-27 MED ORDER — LISINOPRIL 20 MG PO TABS: 20.0000 mg | ORAL_TABLET | Freq: Every day | ORAL | 3 refills | Status: DC

## 2022-01-27 MED ORDER — CARVEDILOL 12.5 MG PO TABS: 12.5000 mg | ORAL_TABLET | Freq: Two times a day (BID) | ORAL | 3 refills | Status: DC

## 2022-01-27 NOTE — Patient Instructions (Addendum)
Medication Instructions:  No changes  Ok to take atorvastatin, lisinopril, farxiga and amlodipine to the morning  If you need a refill on your cardiac medications before your next appointment, please call your pharmacy.   Lab work: No new labs needed  Testing/Procedures:  Your provider has ordered a heart monitor to wear for 14 days. This will be mailed to your home with instructions on placement. Once you have finished the time frame requested, you will return monitor in box provided.     Follow-Up: At [facility], you and your health needs are our priority.  As part of our continuing mission to provide you with exceptional heart care, we have created designated Provider Care Teams.  These Care Teams include your primary Cardiologist (physician) and Advanced Practice Providers (APPs -  Physician Assistants and Nurse Practitioners) who all work together to provide you with the care you need, when you need it.  You will need a follow up appointment in 12 months  Providers on your designated Care Team:   Murray Hodgkins, NP Christell Faith, PA-C Cadence Kathlen Mody, [place]  COVID-19 Vaccine Information can be found at: [email or web address] For questions related to vaccine distribution or appointments, please email [email or web address] or call [phone].

## 2022-01-31 DIAGNOSIS — I479 Paroxysmal tachycardia, unspecified: Secondary | ICD-10-CM | POA: Diagnosis not present

## 2022-02-19 ENCOUNTER — Telehealth: Payer: Self-pay

## 2022-02-19 NOTE — Chronic Care Management (AMB) (Signed)
Chronic Care Management Pharmacy Assistant Name: Craig Avery  MRN: [id number] DOB: [birth year]   Reason for Encounter: Hypertension Disease State    Recent office visits:  11/20/21-Javier Gutierrez,MD(PCP)-AWV,labs (Your liver, sugar, blood counts, vitamin D and cholesterol levels returned okay.urine protein levels were mildly elevated and your kidney function remains impaired but stable.  You may benefit from a new medicine called Wilder Glade which would help protect the kidneys.) discussed screenings,prevnar 53 today, f/u 6 months  Recent consult visits:  01/27/22-Timothy Gollan,MD(cardio)-f/u PVD,no medication changes, zio monitor referral f/u 12 months 11/09/21-Vance Brabham,MD(vas.surg)-f/u carotid endarterectomy.No changes, f/u 1 year  Hospital visits:  None in previous 6 months  Medications: Outpatient Encounter Medications as of 02/19/2022  Medication Sig   amLODipine (NORVASC) 10 MG tablet Take 1 tablet (10 mg total) by mouth daily.   atorvastatin (LIPITOR) 80 MG tablet Take 1 tablet (80 mg total) by mouth daily.   carvedilol (COREG) 12.5 MG tablet Take 1 tablet (12.5 mg total) by mouth 2 (two) times daily.   cloNIDine (CATAPRES) 0.1 MG tablet Take 1 tablet (0.1 mg total) by mouth 2 (two) times daily.   clopidogrel (PLAVIX) 75 MG tablet TAKE 1 TABLET BY MOUTH EVERY DAY   dapagliflozin propanediol (FARXIGA) 5 MG TABS tablet Take 1 tablet (5 mg total) by mouth daily before breakfast.   Docusate Calcium (STOOL SOFTENER PO) Take by mouth daily.   hydrALAZINE (APRESOLINE) 25 MG tablet Take 1 tablet (25 mg total) by mouth 3 (three) times daily as needed. Take for SBP >160   lisinopril (ZESTRIL) 20 MG tablet Take 1 tablet (20 mg total) by mouth daily.   loratadine (CLARITIN) 10 MG tablet Take 10 mg by mouth daily.   Multiple Vitamins-Minerals (CENTRUM SILVER ADULT 50+ PO) Take 1 tablet by mouth daily.   No facility-administered encounter medications on file as of 02/19/2022.     Recent Office Vitals: BP Readings from Last 3 Encounters:  01/27/22 (!) 142/60  11/20/21 (!) 152/78  11/09/21 (!) 213/101   Pulse Readings from Last 3 Encounters:  01/27/22 (!) 58  11/20/21 (!) 59  11/09/21 75    Wt Readings from Last 3 Encounters:  01/27/22 204 lb 2 oz (92.6 kg)  11/20/21 202 lb (91.6 kg)  11/09/21 202 lb (91.6 kg)     Kidney Function Lab Results  Component Value Date/Time   CREATININE 1.53 (H) 11/20/2021 12:05 PM   CREATININE 1.57 (H) 03/13/2021 12:03 PM   GFR 42.05 (L) 11/20/2021 12:05 PM   GFRNONAA 44 (L) 03/13/2021 12:03 PM   GFRAA >60 07/23/2019 04:43 AM       Latest Ref Rng & Units 11/20/2021   12:05 PM 03/13/2021   12:03 PM 02/18/2021    8:10 AM  BMP  Glucose 70 - 99 mg/dL 96  115  97   BUN 6 - 23 mg/dL '20  27  17   '$ Creatinine 0.40 - 1.50 mg/dL 1.53  1.57  1.53   Sodium 135 - 145 mEq/L 142  141  140   Potassium 3.5 - 5.1 mEq/L 4.5  4.8  4.8   Chloride 96 - 112 mEq/L 105  108  105   CO2 19 - 32 mEq/L '27  27  26   '$ Calcium 8.4 - 10.5 mg/dL 9.2  8.9  9.0      Contacted patient on 03/04/22 to discuss hypertension disease state   Current antihypertensive regimen:   Amlodipine 10 mg daily AM Carvedilol 12.5 mg BID Clonidine 0.1  mg BID Hydralazine 25 mg TID SBP > 160 Lisinopril 20 mg daily PM   Patient verbally confirms he is taking the above medications as directed. Yes  How often are you checking your Blood Pressure? several times per month  he checks his blood pressure in the morning after taking his medication.  Current home BP readings:  8am, noon, 8pm  DATE:             BP               PULSE  02/21/22 139/63  -    138/71    162/71  03/02/22 152/67  -    132/53    128/60  03/03/22 183/71 (noon) Took hydralazine    128/61 (230pm)    128/67 (800pm)  03/04/22 173/74  (10am)  Took hydralazine    139/58 (1230pm)  Wrist or arm cuff:arm cuff Caffeine intake: a few cups of regular coffee a week Salt intake: will not add salt to  foods Over the counter medications including pseudoephedrine or NSAIDs? No  Any readings above 180/120? Yes If yes any symptoms of hypertensive emergency? patient denies any symptoms of high blood pressure- The patient will take Hydralazine and the pressure comes down.   What recent interventions/DTPs have been made by any provider to improve Blood Pressure control since last CPP Visit:   Recommend he move some of his evening medications to the morning  so he does not miss them Any recent hospitalizations or ED visits since last visit with CPP? No  What diet changes have been made to improve Blood Pressure Control? The patient will not add salt to food,trying to increase water intake.  What exercise is being done to improve your Blood Pressure Control?  Nothing formal, the patient has a shop at his house to work around in.   Adherence Review: Is the patient currently on ACE/ARB medication? Yes Does the patient have >5 day gap between last estimated fill dates? No   Star Rating Drugs:  Medication:  Last Fill: Day Supply Lisinopril '20mg'$  01/27/22 90 Atorvastatin '80mg'$  01/05/22 90 Farxiga '5mg'$   01/31/22 30    Care Gaps: Annual wellness visit in last year? Yes Most Recent BP reading:142/60  58-P 01/27/22  Upcoming appointments: PCP appointment on 05/24/22  -Advised Prevnar vaccine at next PCP visit - pt agreed  Charlene Brooke, CPP notified  Avel Sensor, [place]  [phone]

## 2022-02-24 DIAGNOSIS — I479 Paroxysmal tachycardia, unspecified: Secondary | ICD-10-CM | POA: Diagnosis not present

## 2022-04-01 ENCOUNTER — Other Ambulatory Visit: Payer: Self-pay | Admitting: Family Medicine

## 2022-04-09 ENCOUNTER — Other Ambulatory Visit: Payer: Self-pay | Admitting: Family Medicine

## 2022-04-12 NOTE — Telephone Encounter (Signed)
Plavix Last filled:  01/28/22, #90 Last OV:  11/20/21, AWV Next OV:  05/24/22, 6 mo f/u

## 2022-04-13 NOTE — Telephone Encounter (Signed)
ERx 

## 2022-05-24 ENCOUNTER — Ambulatory Visit (INDEPENDENT_AMBULATORY_CARE_PROVIDER_SITE_OTHER): Payer: Medicare Other | Admitting: Family Medicine

## 2022-05-24 ENCOUNTER — Encounter: Payer: Self-pay | Admitting: Family Medicine

## 2022-05-24 VITALS — BP 136/62 | HR 63 | Temp 98.0°F | Ht 63.0 in | Wt 204.4 lb

## 2022-05-24 DIAGNOSIS — N1832 Chronic kidney disease, stage 3b: Secondary | ICD-10-CM

## 2022-05-24 DIAGNOSIS — I6523 Occlusion and stenosis of bilateral carotid arteries: Secondary | ICD-10-CM

## 2022-05-24 DIAGNOSIS — Z23 Encounter for immunization: Secondary | ICD-10-CM

## 2022-05-24 DIAGNOSIS — I10 Essential (primary) hypertension: Secondary | ICD-10-CM | POA: Diagnosis not present

## 2022-05-24 LAB — RENAL FUNCTION PANEL
Albumin: 4.2 g/dL (ref 3.5–5.2)
BUN: 29 mg/dL — ABNORMAL HIGH (ref 6–23)
CO2: 28 mEq/L (ref 19–32)
Calcium: 8.9 mg/dL (ref 8.4–10.5)
Chloride: 106 mEq/L (ref 96–112)
Creatinine, Ser: 1.89 mg/dL — ABNORMAL HIGH (ref 0.40–1.50)
GFR: 32.51 mL/min — ABNORMAL LOW (ref >60.00)
Glucose, Bld: 85 mg/dL (ref 70–99)
Phosphorus: 2.7 mg/dL (ref 2.3–4.6)
Potassium: 4.8 mEq/L (ref 3.5–5.1)
Sodium: 140 mEq/L (ref 135–145)

## 2022-05-24 MED ORDER — DAPAGLIFLOZIN PROPANEDIOL 5 MG PO TABS: 5.0000 mg | ORAL_TABLET | Freq: Every day | ORAL | 6 refills | Status: DC

## 2022-05-24 NOTE — Progress Notes (Signed)
Patient ID: Craig Avery, male    DOB: August 09, 1940, 81 y.o.   MRN: [id number]  This visit was conducted in person.  BP 136/62   Pulse 63   Temp 98 F (36.7 C) (Temporal)   Ht '5\' 3"'$  (1.6 m)   Wt 204 lb 6.4 oz (92.7 kg)   SpO2 95%   BMI 36.21 kg/m    CC: 6 mo f/u visit  Subjective:   HPI: Craig Avery is a 81 y.o. male presenting on 05/24/2022 for Follow-up (Here for 6 mo f/u.)   Sees [facility] (Brabham) s/p L CEA for asymptomatic stenosis 2014.  Regularly sees cardiology as well (Dr Rockey Situ). Last seen 01/2022 - s/p zio monitor for paroxysmal tachycardia - reassuring evaluation (daily tachycardia lasting a few seconds).   Daughter fills pill box weekly.   Last visit we started farxiga for CKD and HTN - he's tolerating this well without symptoms of UTI, yeast infection, or groin cellulitis.   BP - 782 systolic this morning. Notes BP improves after he takes his morning meds. Occ missed PM dose. No HA, vision changes, CP/tightness, SOB, leg swelling.      Relevant past medical, surgical, family and social history reviewed and updated as indicated. Interim medical history since our last visit reviewed. Allergies and medications reviewed and updated. Outpatient Medications Prior to Visit  Medication Sig Dispense Refill   amLODipine (NORVASC) 10 MG tablet Take 1 tablet (10 mg total) by mouth daily. 90 tablet 3   atorvastatin (LIPITOR) 80 MG tablet Take 1 tablet (80 mg total) by mouth daily. 90 tablet 3   carvedilol (COREG) 12.5 MG tablet Take 1 tablet (12.5 mg total) by mouth 2 (two) times daily. 180 tablet 3   cloNIDine (CATAPRES) 0.1 MG tablet Take 1 tablet (0.1 mg total) by mouth 2 (two) times daily. 180 tablet 3   clopidogrel (PLAVIX) 75 MG tablet TAKE 1 TABLET BY MOUTH EVERY DAY 90 tablet 1   Docusate Calcium (STOOL SOFTENER PO) Take by mouth daily.     hydrALAZINE (APRESOLINE) 25 MG tablet Take 1 tablet (25 mg total) by mouth 3 (three) times daily as needed. Take for SBP  >160 180 tablet 3   lisinopril (ZESTRIL) 20 MG tablet Take 1 tablet (20 mg total) by mouth daily. 90 tablet 3   loratadine (CLARITIN) 10 MG tablet Take 10 mg by mouth daily.     Multiple Vitamins-Minerals (CENTRUM SILVER ADULT 50+ PO) Take 1 tablet by mouth daily.     FARXIGA 5 MG TABS tablet TAKE 1 TABLET BY MOUTH DAILY BEFORE BREAKFAST. 30 tablet 2   No facility-administered medications prior to visit.     Per HPI unless specifically indicated in ROS section below Review of Systems  Objective:  BP 136/62   Pulse 63   Temp 98 F (36.7 C) (Temporal)   Ht '5\' 3"'$  (1.6 m)   Wt 204 lb 6.4 oz (92.7 kg)   SpO2 95%   BMI 36.21 kg/m   Wt Readings from Last 3 Encounters:  05/24/22 204 lb 6.4 oz (92.7 kg)  01/27/22 204 lb 2 oz (92.6 kg)  11/20/21 202 lb (91.6 kg)      Physical Exam Vitals and nursing note reviewed.  Constitutional:      Appearance: Normal appearance. He is not ill-appearing.  HENT:     Mouth/Throat:     Mouth: Mucous membranes are moist.     Pharynx: Oropharynx is clear. No oropharyngeal exudate or posterior  oropharyngeal erythema.  Eyes:     Extraocular Movements: Extraocular movements intact.     Pupils: Pupils are equal, round, and reactive to light.  Cardiovascular:     Rate and Rhythm: Normal rate and regular rhythm.     Pulses: Normal pulses.     Heart sounds: Normal heart sounds. No murmur heard. Pulmonary:     Effort: Pulmonary effort is normal. No respiratory distress.     Breath sounds: Normal breath sounds. No wheezing, rhonchi or rales.  Musculoskeletal:     Right lower leg: No edema.     Left lower leg: No edema.  Skin:    General: Skin is warm and dry.     Findings: No rash.  Neurological:     Mental Status: He is alert.  Psychiatric:        Mood and Affect: Mood normal.        Behavior: Behavior normal.       Lab Results  Component Value Date   CREATININE 1.53 (H) 11/20/2021   BUN 20 11/20/2021   NA 142 11/20/2021   K 4.5  11/20/2021   CL 105 11/20/2021   CO2 27 11/20/2021     Assessment & Plan:   Problem List Items Addressed This Visit     HTN (hypertension)    Chronic, adequate in office today.  Continue current regimen.       CKD (chronic kidney disease) stage 3, GFR 30-59 ml/min (HCC) - Primary    Update renal panel on acei + sglt2i.  He's tolerating farxiga well.       Relevant Orders   Renal function panel   Other Visit Diagnoses     Need for influenza vaccination       Relevant Orders   Flu Vaccine QUAD High Dose(Fluad) (Completed)        Meds ordered this encounter  Medications   dapagliflozin propanediol (FARXIGA) 5 MG TABS tablet    Sig: Take 1 tablet (5 mg total) by mouth daily before breakfast.    Dispense:  30 tablet    Refill:  6   Orders Placed This Encounter  Procedures   Flu Vaccine QUAD High Dose(Fluad)   Renal function panel     Patient Instructions  Flu shot today Labs today to check kidney function Continue current medicines including latest farxiga (dapagliflozin) '5mg'$  daily.  Good to see you today  Return in 6 months for medicare wellness visit.   Follow up plan: Return in about 6 months (around 11/22/2022) for medicare wellness visit.  [name], MD

## 2022-05-24 NOTE — Assessment & Plan Note (Signed)
Update renal panel on acei + sglt2i.  He's tolerating farxiga well.

## 2022-05-24 NOTE — Patient Instructions (Signed)
Flu shot today Labs today to check kidney function Continue current medicines including latest farxiga (dapagliflozin) '5mg'$  daily.  Good to see you today  Return in 6 months for medicare wellness visit.

## 2022-05-24 NOTE — Assessment & Plan Note (Signed)
Chronic, adequate in office today.  Continue current regimen.

## 2022-06-11 DIAGNOSIS — H35373 Puckering of macula, bilateral: Secondary | ICD-10-CM | POA: Diagnosis not present

## 2022-06-11 DIAGNOSIS — Z961 Presence of intraocular lens: Secondary | ICD-10-CM | POA: Diagnosis not present

## 2022-07-26 DIAGNOSIS — H35359 Cystoid macular degeneration, unspecified eye: Secondary | ICD-10-CM | POA: Diagnosis not present

## 2022-07-26 DIAGNOSIS — H35373 Puckering of macula, bilateral: Secondary | ICD-10-CM | POA: Diagnosis not present

## 2022-07-26 DIAGNOSIS — Z961 Presence of intraocular lens: Secondary | ICD-10-CM | POA: Diagnosis not present

## 2022-08-09 ENCOUNTER — Telehealth: Payer: Self-pay

## 2022-08-09 NOTE — Progress Notes (Signed)
Care Management & Coordination Services Pharmacy Team  Reason for Encounter: Appointment Reminder  Contacted patient to confirm telephone appointment with Charlene Brooke,   PharmD, on 08/09/22 at 8:45. Spoke with patient on 08/09/2022   Do you have any problems getting your medications? No   What is your top health concern you would like to discuss at your upcoming visit?  No concerns at this time   Have you seen any other providers since your last visit with PCP? No    Hospital visits:  None in previous 6 months   Star Rating Drugs:  Medication:  Last Fill: Day Supply Atorvastatin '80mg'$  07/05/22 90 Farxiga '5mg'$   08/01/22 30 Lisinopril '20mg'$  06/02/22 90   Care Gaps: Annual wellness visit in last year? Yes    Charlene Brooke, PharmD notified  Avel Sensor, [place] Assistant [phone]

## 2022-08-12 ENCOUNTER — Ambulatory Visit: Payer: Medicare Other | Admitting: Pharmacist

## 2022-08-12 NOTE — Patient Instructions (Signed)
Visit Information  Phone number for Pharmacist: [phone]  Thank you for meeting with me to discuss your medications! Below is a summary of what we talked about during the visit:   Recommendations/Changes made from today's visit: - No med changes  Follow up plan: -Health Concierge will call patient 6 months for BP update -Pharmacist follow up PRN -PCP appt 11/22/22 (CPE)   Charlene Brooke, PharmD, BCACP Clinical Pharmacist [facility] [phone]

## 2022-08-12 NOTE — Progress Notes (Signed)
Care Management & Coordination Services Pharmacy Note  08/12/2022 Name:  Craig Avery MRN:  [id number] DOB:  07/12/1941  Summary: F/U visit -Reviewed medications; pt affirms compliance as prescribed -HTN: pt does not have any BP readings available but reports they have been reasonable; he has not needed hydralazine for SBP > 160 in a few weeks; BP was at goal in most recent OV -CKD: pt is compliant with Farxiga, lisinopril; discussed hydration and avoiding NSAIDs  Recommendations/Changes made from today's visit: - No med changes  Follow up plan: -Health Concierge will call patient 6 months for BP update -Pharmacist follow up PRN -PCP appt 11/22/22 (CPE)    Subjective: Craig Avery is an 82 y.o. year old male who is a primary patient of [name], MD.  The care coordination team was consulted for assistance with disease management and care coordination needs.    Engaged with patient by telephone for follow up visit. Patient is close with his daughter, she sets up pill box for him each week   Recent office visits: 05/24/22 Dr Danise Mina OV: f/u - update renal panel. GFR 32. Recheck at next visit.  11/20/21 Dr Danise Mina OV: annual - BP 152/78 before BP meds. Monitor BP at home after meds. Rx Farxiga for CKD.  Recent consult visits: 01/27/22 Dr Rockey Situ (Cardiology): f/u PVD. Misses doses sometimes.   11/09/21 Dr Trula Slade (Vasc surg): f/u Carotid stenosis - BP 213/101. No changes  Hospital visits: None in previous 6 months   Objective:  Lab Results  Component Value Date   CREATININE 1.89 (H) 05/24/2022   BUN 29 (H) 05/24/2022   GFR 32.51 (L) 05/24/2022   EGFR 41 (L) 10/27/2020   GFRNONAA 44 (L) 03/13/2021   GFRAA >60 07/23/2019   NA 140 05/24/2022   K 4.8 05/24/2022   CALCIUM 8.9 05/24/2022   CO2 28 05/24/2022   GLUCOSE 85 05/24/2022    Lab Results  Component Value Date/Time   HGBA1C 6.1 (H) 07/19/2019 12:15 AM   HGBA1C 5.6 11/10/2018 11:03 AM   GFR  32.51 (L) 05/24/2022 11:47 AM   GFR 42.05 (L) 11/20/2021 12:05 PM   MICROALBUR 11.3 (H) 11/20/2021 12:05 PM   MICROALBUR 0.5 11/10/2018 02:57 PM    Last diabetic Eye exam: No results found for: "HMDIABEYEEXA"  Last diabetic Foot exam: No results found for: "HMDIABFOOTEX"   Lab Results  Component Value Date   CHOL 167 11/20/2021   HDL 60.20 11/20/2021   LDLCALC 81 11/20/2021   LDLDIRECT 74.0 07/07/2016   TRIG 130.0 11/20/2021   CHOLHDL 3 11/20/2021       Latest Ref Rng & Units 05/24/2022   11:47 AM 11/20/2021   12:05 PM 02/18/2021    8:10 AM  Hepatic Function  Total Protein 6.0 - 8.3 g/dL  7.2    Albumin 3.5 - 5.2 g/dL 4.2  4.6  4.0   AST 0 - 37 U/L  25    ALT 0 - 53 U/L  31    Alk Phosphatase 39 - 117 U/L  68    Total Bilirubin 0.2 - 1.2 mg/dL  0.7      Lab Results  Component Value Date/Time   TSH 4.50 11/17/2020 01:53 PM   TSH 1.108 07/22/2019 05:01 AM   TSH 4.71 (H) 11/10/2018 11:03 AM       Latest Ref Rng & Units 11/20/2021   12:05 PM 11/17/2020    1:53 PM 11/19/2019   12:19 PM  CBC  WBC 4.0 -  10.5 K/uL 9.4  8.0  7.7   Hemoglobin 13.0 - 17.0 g/dL 14.2  12.7  13.1   Hematocrit 39.0 - 52.0 % 41.4  36.8  38.5   Platelets 150.0 - 400.0 K/uL 267.0  269.0  242.0     Lab Results  Component Value Date/Time   VD25OH 47.79 11/20/2021 12:05 PM   VD25OH 39.50 11/17/2020 01:53 PM   VITAMINB12 531 07/22/2019 05:01 AM   VITAMINB12 545 12/12/2012 09:06 AM    Clinical ASCVD: Yes  The ASCVD Risk score (Arnett DK, et al., 2019) failed to calculate for the following reasons:   The 2019 ASCVD risk score is only valid for ages 35 to 20        11/20/2021   11:02 AM 11/18/2020   10:20 AM 11/14/2019   11:09 AM  Depression screen PHQ 2/9  Decreased Interest 0 0 0  Down, Depressed, Hopeless 0 0 0  PHQ - 2 Score 0 0 0  Altered sleeping  0 0  Tired, decreased energy  1 0  Change in appetite  0 0  Feeling bad or failure about yourself   0 0  Trouble concentrating  0 0  Moving  slowly or fidgety/restless  0 0  Suicidal thoughts  0 0  PHQ-9 Score  1 0  Difficult doing work/chores   Not difficult at all     Social History   Tobacco Use  Smoking Status Former   Packs/day: 1.50   Years: 33.00   Total pack years: 49.50   Types: Cigarettes   Quit date: 06/18/1994   Years since quitting: 28.1  Smokeless Tobacco Never   BP Readings from Last 3 Encounters:  05/24/22 136/62  01/27/22 (!) 142/60  11/20/21 (!) 152/78   Pulse Readings from Last 3 Encounters:  05/24/22 63  01/27/22 (!) 58  11/20/21 (!) 59   Wt Readings from Last 3 Encounters:  05/24/22 204 lb 6.4 oz (92.7 kg)  01/27/22 204 lb 2 oz (92.6 kg)  11/20/21 202 lb (91.6 kg)   BMI Readings from Last 3 Encounters:  05/24/22 36.21 kg/m  01/27/22 36.16 kg/m  11/20/21 36.07 kg/m    No Known Allergies  Medications Reviewed Today     Reviewed by Charlton Haws, [facility] (Pharmacist) on 08/12/22 at 0853  Med List Status: <None>   Medication Order Taking? Sig Documenting Provider Last Dose Status Informant  amLODipine (NORVASC) 10 MG tablet [id number] Yes Take 1 tablet (10 mg total) by mouth daily. Minna Merritts, MD Taking Active   atorvastatin (LIPITOR) 80 MG tablet [id number] Yes Take 1 tablet (80 mg total) by mouth daily. Minna Merritts, MD Taking Active   carvedilol (COREG) 12.5 MG tablet [id number] Yes Take 1 tablet (12.5 mg total) by mouth 2 (two) times daily. Minna Merritts, MD Taking Active   cloNIDine (CATAPRES) 0.1 MG tablet [id number] Yes Take 1 tablet (0.1 mg total) by mouth 2 (two) times daily. Minna Merritts, MD Taking Active   clopidogrel (PLAVIX) 75 MG tablet [id number] Yes TAKE 1 TABLET BY MOUTH EVERY DAY [name], MD Taking Active   dapagliflozin propanediol (FARXIGA) 5 MG TABS tablet [id number] Yes Take 1 tablet (5 mg total) by mouth daily before breakfast. [name], MD Taking Active   Docusate Calcium (STOOL SOFTENER PO) [id number] Yes Take by mouth  daily. [provider] Taking Active   hydrALAZINE (APRESOLINE) 25 MG tablet [id number] Yes Take 1 tablet (25 mg total) by mouth 3 (three) times  daily as needed. Take for SBP >160 Gollan, Kathlene November, MD Taking Active   lisinopril (ZESTRIL) 20 MG tablet [id number] Yes Take 1 tablet (20 mg total) by mouth daily. Minna Merritts, MD Taking Active   loratadine (CLARITIN) 10 MG tablet [id number] Yes Take 10 mg by mouth daily. [provider] Taking Active   Multiple Vitamins-Minerals (CENTRUM SILVER ADULT 50+ PO) [id number] Yes Take 1 tablet by mouth daily. [provider] Taking Active Child            SDOH:  (Social Determinants of Health) assessments and interventions performed: No SDOH Interventions    Flowsheet Row Chronic Care Management from 08/14/2021 in [place] [facility] from 11/14/2019 in [place] at [facility] from 11/10/2018 in [place] at [place] Interventions     Food Insecurity Interventions Intervention Not Indicated -- --  Depression Interventions/Treatment  -- IRC7-8 Score <4 Follow-up Not Indicated PHQ2-9 Score <4 Follow-up Not Indicated  Financial Strain Interventions Intervention Not Indicated -- --       Medication Assistance: None required.  Patient affirms current coverage meets needs.  Medication Access: Within the past 30 days, how often has patient missed a dose of medication? 0 Is a pillbox or other method used to improve adherence? Yes  Factors that may affect medication adherence? no barriers identified Are meds synced by current pharmacy? No  Are meds delivered by current pharmacy? No  Does patient experience delays in picking up medications due to transportation concerns? No   [facility] Reviewed: Is patient disadvantaged to use [facility]?: Yes  Current Rx insurance plan: [facility] Name and location  of Current pharmacy:  [id number] [place], [place] [id number] [id number]: [phone]: [address]Phone: [phone]: [phone] [facility] services reviewed with patient today?: No  Patient requests to transfer care to [facility]?: No  Reason patient declined to change pharmacies: Disadvantaged due to insurance/mail order  Compliance/Adherence/Medication fill history: Care Gaps: None  Star-Rating Drugs: Atorvastatin - PDC 100% Farxiga - PDC 99% Lisinopril - PDC 46%; LF 06/02/22 x 90 ds   Assessment/Plan   Hypertension / CKD stage 3b (BP goal <140/90) -Controlled - BP is at goal in recent office visits; pt checks BP a few times a week in AM, if SBP over 160 he takes hydralazine; last taken a few weeks ago -Home BP: pt does not write down readings, cannot recall last few readings -Current treatment: Amlodipine 10 mg daily AM - Appropriate, Effective, Safe, Accessible Carvedilol 12.5 mg BID -Appropriate, Effective, Safe, Accessible Clonidine 0.1 mg BID -Appropriate, Effective, Safe, Accessible Hydralazine 25 mg TID SBP > 160 -Appropriate, Effective, Safe, Accessible Lisinopril 20 mg daily PM -Appropriate, Effective, Safe, Accessible -Medications previously tried: n/a  -Denies hypotensive/hypertensive symptoms -Educated on BP goals and benefits of medications for prevention of heart attack, stroke and kidney damage; -Counseled to monitor BP at home daily -Recommended to continue current medication  Hyperlipidemia / ASCVD: (LDL goal < 70) -Not ideally controlled - LDL 81 (11/2021), LDL was previously < 70 on same regimen, encouraged daily compliance with pill box -Hx stroke, PVD -Current treatment: Atorvastatin 80 mg daily -Appropriate, Effective, Safe, Accessible Clopidogrel 75 mg daily AM -Appropriate,  Effective, Safe, Accessible -Educated on  Cholesterol goals; Benefits of statin for ASCVD risk reduction; -Recommended to continue current medication  Chronic Kidney Disease Stage 3b  -All medications assessed for renal dosing and appropriateness in chronic kidney disease. -Current treatment: Farxiga 5 mg daily - Appropriate, Effective, Safe, Accessible Lisinopril 20 mg daily - Appropriate, Effective, Safe, Accessible -Reviewed GFR, declined from 42 to 32 in the 6 months following initiation of Farxiga -Recommended to continue current medication  Health Maintenance -Vaccine gaps: Shingrix, covid booster -Current therapy:  Docusate Loratadine 10 mg AM Multivitamin B complex -Patient is satisfied with current therapy and denies issues -Recommended to continue current medication   Charlene Brooke, PharmD, BCACP Clinical Pharmacist [facility] at [facility] [phone]

## 2022-09-27 DIAGNOSIS — H35353 Cystoid macular degeneration, bilateral: Secondary | ICD-10-CM | POA: Diagnosis not present

## 2022-09-27 DIAGNOSIS — H35372 Puckering of macula, left eye: Secondary | ICD-10-CM | POA: Diagnosis not present

## 2022-11-22 ENCOUNTER — Ambulatory Visit (INDEPENDENT_AMBULATORY_CARE_PROVIDER_SITE_OTHER): Payer: Medicare Other | Admitting: Family Medicine

## 2022-11-22 ENCOUNTER — Encounter: Payer: Self-pay | Admitting: Family Medicine

## 2022-11-22 VITALS — BP 168/64 | HR 61 | Temp 97.3°F | Ht 62.5 in | Wt 201.5 lb

## 2022-11-22 DIAGNOSIS — F1090 Alcohol use, unspecified, uncomplicated: Secondary | ICD-10-CM | POA: Diagnosis not present

## 2022-11-22 DIAGNOSIS — Z7189 Other specified counseling: Secondary | ICD-10-CM

## 2022-11-22 DIAGNOSIS — Z8673 Personal history of transient ischemic attack (TIA), and cerebral infarction without residual deficits: Secondary | ICD-10-CM | POA: Diagnosis not present

## 2022-11-22 DIAGNOSIS — D472 Monoclonal gammopathy: Secondary | ICD-10-CM

## 2022-11-22 DIAGNOSIS — I10 Essential (primary) hypertension: Secondary | ICD-10-CM | POA: Diagnosis not present

## 2022-11-22 DIAGNOSIS — I6523 Occlusion and stenosis of bilateral carotid arteries: Secondary | ICD-10-CM | POA: Diagnosis not present

## 2022-11-22 DIAGNOSIS — Z0000 Encounter for general adult medical examination without abnormal findings: Secondary | ICD-10-CM

## 2022-11-22 DIAGNOSIS — E785 Hyperlipidemia, unspecified: Secondary | ICD-10-CM | POA: Diagnosis not present

## 2022-11-22 DIAGNOSIS — D72820 Lymphocytosis (symptomatic): Secondary | ICD-10-CM

## 2022-11-22 DIAGNOSIS — I739 Peripheral vascular disease, unspecified: Secondary | ICD-10-CM | POA: Diagnosis not present

## 2022-11-22 DIAGNOSIS — G459 Transient cerebral ischemic attack, unspecified: Secondary | ICD-10-CM | POA: Diagnosis not present

## 2022-11-22 DIAGNOSIS — N1832 Chronic kidney disease, stage 3b: Secondary | ICD-10-CM

## 2022-11-22 LAB — CBC WITH DIFFERENTIAL/PLATELET
Basophils Absolute: 0.1 10*3/uL (ref 0.0–0.1)
Basophils Relative: 0.5 % (ref 0.0–3.0)
Eosinophils Absolute: 0.1 10*3/uL (ref 0.0–0.7)
Eosinophils Relative: 1.1 % (ref 0.0–5.0)
HCT: 44.2 % (ref 39.0–52.0)
Hemoglobin: 14.9 g/dL (ref 13.0–17.0)
Lymphocytes Relative: 42.1 % (ref 12.0–46.0)
Lymphs Abs: 4.2 10*3/uL — ABNORMAL HIGH (ref 0.7–4.0)
MCHC: 33.7 g/dL (ref 30.0–36.0)
MCV: 94.4 fl (ref 78.0–100.0)
Monocytes Absolute: 0.5 10*3/uL (ref 0.1–1.0)
Monocytes Relative: 4.7 % (ref 3.0–12.0)
Neutro Abs: 5.2 10*3/uL (ref 1.4–7.7)
Neutrophils Relative %: 51.6 % (ref 43.0–77.0)
Platelets: 278 10*3/uL (ref 150.0–400.0)
RBC: 4.68 Mil/uL (ref 4.22–5.81)
RDW: 13.5 % (ref 11.5–15.5)
WBC: 10 10*3/uL (ref 4.0–10.5)

## 2022-11-22 LAB — MICROALBUMIN / CREATININE URINE RATIO
Creatinine,U: 32 mg/dL
Microalb Creat Ratio: 11.3 mg/g (ref 0.0–30.0)
Microalb, Ur: 3.6 mg/dL — ABNORMAL HIGH (ref 0.0–1.9)

## 2022-11-22 LAB — COMPREHENSIVE METABOLIC PANEL
ALT: 23 U/L (ref 0–53)
AST: 21 U/L (ref 0–37)
Albumin: 4.5 g/dL (ref 3.5–5.2)
Alkaline Phosphatase: 67 U/L (ref 39–117)
BUN: 22 mg/dL (ref 6–23)
CO2: 24 mEq/L (ref 19–32)
Calcium: 9.1 mg/dL (ref 8.4–10.5)
Chloride: 106 mEq/L (ref 96–112)
Creatinine, Ser: 1.67 mg/dL — ABNORMAL HIGH (ref 0.40–1.50)
GFR: 37.59 mL/min — ABNORMAL LOW (ref >60.00)
Glucose, Bld: 96 mg/dL (ref 70–99)
Potassium: 4.5 mEq/L (ref 3.5–5.1)
Sodium: 141 mEq/L (ref 135–145)
Total Bilirubin: 0.8 mg/dL (ref 0.2–1.2)
Total Protein: 7.4 g/dL (ref 6.0–8.3)

## 2022-11-22 LAB — FOLATE: Folate: >23.9 ng/mL (ref >5.9)

## 2022-11-22 LAB — VITAMIN B12: Vitamin B-12: 556 pg/mL (ref 211–911)

## 2022-11-22 LAB — LIPID PANEL
Cholesterol: 162 mg/dL (ref 0–200)
HDL: 64.4 mg/dL (ref >39.00)
LDL Cholesterol: 76 mg/dL (ref 0–99)
NonHDL: 97.3
Total CHOL/HDL Ratio: 3
Triglycerides: 109 mg/dL (ref 0.0–149.0)
VLDL: 21.8 mg/dL (ref 0.0–40.0)

## 2022-11-22 LAB — PHOSPHORUS: Phosphorus: 3.4 mg/dL (ref 2.3–4.6)

## 2022-11-22 LAB — VITAMIN D 25 HYDROXY (VIT D DEFICIENCY, FRACTURES): VITD: 50.34 ng/mL (ref 30.00–100.00)

## 2022-11-22 MED ORDER — DAPAGLIFLOZIN PROPANEDIOL 5 MG PO TABS: 5.0000 mg | ORAL_TABLET | Freq: Every day | ORAL | 4 refills | Status: DC

## 2022-11-22 MED ORDER — CLOPIDOGREL BISULFATE 75 MG PO TABS: 75.0000 mg | ORAL_TABLET | Freq: Every day | ORAL | 4 refills | Status: DC

## 2022-11-22 NOTE — Assessment & Plan Note (Signed)
Update labs. He continues ACEI and farxiga 5mg  - consider increasing to 10 pending results.

## 2022-11-22 NOTE — Patient Instructions (Addendum)
Labs today  Ok to take medications on empty stomach especially if coming in for office visit.  BP was too high today - keep track at home with log provided today, drop off in 1-2 weeks to review readings. Check at home if you have a living will/advanced directive section to your regular will and if so, bring [place] a copy.  Good to see you today Return in 6 months for follow up visit , sooner if needed

## 2022-11-22 NOTE — Assessment & Plan Note (Addendum)
Followed by [facility] s/p L CEA

## 2022-11-22 NOTE — Assessment & Plan Note (Signed)
Advanced directives - unsure if he has living will. Does not want prolonged life support if terminal condition - doesn't want to be "burden". Would want daughters to be HCPOA. Packet provided previously.

## 2022-11-22 NOTE — Assessment & Plan Note (Signed)
Chronic, continues atorvastatin 80 mg daily. Update FLP. The ASCVD Risk score (Arnett DK, et al., 2019) failed to calculate for the following reasons:   The 2019 ASCVD risk score is only valid for ages 17 to 38   The patient has a prior MI or stroke diagnosis

## 2022-11-22 NOTE — Assessment & Plan Note (Addendum)
Continues plavix, statin.

## 2022-11-22 NOTE — Assessment & Plan Note (Signed)
Sees [facility] on plavix and statin.

## 2022-11-22 NOTE — Progress Notes (Signed)
Ph: [phone] Fax: [phone]   Patient ID: Craig Avery, male    DOB: [birth year], 82 y.o.   MRN: [id number]  This visit was conducted in person.  BP (!) 168/64 (BP Location: Right Arm, Cuff Size: Large)   Pulse 61   Temp (!) 97.3 F (36.3 C) (Temporal)   Ht 5' 2.5" (1.588 m)   Wt 201 lb 8 oz (91.4 kg)   SpO2 97%   BMI 36.27 kg/m   BP Readings from Last 3 Encounters:  11/22/22 (!) 168/64  05/24/22 136/62  01/27/22 (!) 142/60  Has not taken BP meds yet this morning.   CC: AMW Subjective:   HPI: Craig Avery is a 82 y.o. male presenting on 11/22/2022 for Medicare Wellness   Did not see health advisor this year  Hearing Screening   500Hz  1000Hz  2000Hz  4000Hz   Right ear 40 40 20 0  Left ear 40 25 20 0   Vision Screening   Right eye Left eye Both eyes  Without correction 20/25 20/70 20/25   With correction     Comments: Next eye appt scheduled on 11/29/22.    Flowsheet Row Office Visit from 11/22/2022 in [facility]  PHQ-2 Total Score 0     Chronic L eye vision loss, regularly sees eye doctor, upcoming appt next week.     11/22/2022    9:58 AM 11/20/2021   11:02 AM 11/18/2020    9:32 AM 11/14/2019   11:09 AM 11/10/2018    9:35 AM  Fall Risk   Falls in the past year? 0 0 0 0 0  Number falls in past yr:    0   Injury with Fall?    0   Risk for fall due to :    Medication side effect   Follow up    Falls evaluation completed;Falls prevention discussed    Markedly elevated BP today - he's not yet taken any medications this morning. He states home BP readings running "about right". Last checked last week but doesn't remember reading. States normally <160 systolic.   Sees [facility] ([facility]) s/p L CEA for asymptomatic stenosis 2014. 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    Daughter helps fills pill box.   Notes loss of appetite since COVID several years ago. Notes  ongoing fatigue and decreased motivation to get going. Weight stable.   Preventative: COLONOSCOPY WITH PROPOFOL 06/24/2015 3 polyps, one with high grade dysplasia, rpt 3 mo; Midge Minium, MD COLONOSCOPY WITH PROPOFOL 10/14/2015 hyperplastic polyp Midge Minium, MD) - aged out.  Prostate cancer screening - aged out  Lung cancer screening - not eligible Needle phobia  Flu shot - declines  COVID vaccine - 10/2019, 11/2019, booster x1 (unsure date)  Prevnar-20 11/2021 Tdap 11/2014  Shingrix - declines  Advanced directives - unsure if he has living will. Does not want prolonged life support if terminal condition - doesn't want to be "burden". Would want daughters to be HCPOA. Packet provided previously.  Seat belt use discussed.  Sunscreen use discussed. BCC removed from R scalp at [facility]. Sees derm yearly. Ex smoker - quit 1995  Alcohol - 2-3 beers/day at local bars, no more than 1 drink per bar, habitual Dentist - has full dentures but doesn't use, doesn't see dentist  Eye exam yearly  Bowels - no diarrhea/constipation  Bladder - no incontinence   Caffeine: light coffee in am, working  on cutting back Lives alone. Widower (08/2017). Daughter next door. Other children nearby. 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 Interim medical history since our last visit reviewed. Allergies and medications reviewed and updated. Outpatient Medications Prior to Visit  Medication Sig Dispense Refill   amLODipine (NORVASC) 10 MG tablet Take 1 tablet (10 mg total) by mouth daily. 90 tablet 3   atorvastatin (LIPITOR) 80 MG tablet Take 1 tablet (80 mg total) by mouth daily. 90 tablet 3   carvedilol (COREG) 12.5 MG tablet Take 1 tablet (12.5 mg total) by mouth 2 (two) times daily. 180  tablet 3   cloNIDine (CATAPRES) 0.1 MG tablet Take 1 tablet (0.1 mg total) by mouth 2 (two) times daily. 180 tablet 3   Docusate Calcium (STOOL SOFTENER PO) Take by mouth daily.     hydrALAZINE (APRESOLINE) 25 MG tablet Take 1 tablet (25 mg total) by mouth 3 (three) times daily as needed. Take for SBP >160 180 tablet 3   lisinopril (ZESTRIL) 20 MG tablet Take 1 tablet (20 mg total) by mouth daily. 90 tablet 3   loratadine (CLARITIN) 10 MG tablet Take 10 mg by mouth daily.     Multiple Vitamins-Minerals (CENTRUM SILVER ADULT 50+ PO) Take 1 tablet by mouth daily.     clopidogrel (PLAVIX) 75 MG tablet TAKE 1 TABLET BY MOUTH EVERY DAY 90 tablet 1   dapagliflozin propanediol (FARXIGA) 5 MG TABS tablet Take 1 tablet (5 mg total) by mouth daily before breakfast. 30 tablet 6   No facility-administered medications prior to visit.     Per HPI unless specifically indicated in ROS section below Review of Systems  Objective:  BP (!) 168/64 (BP Location: Right Arm, Cuff Size: Large)   Pulse 61   Temp (!) 97.3 F (36.3 C) (Temporal)   Ht 5' 2.5" (1.588 m)   Wt 201 lb 8 oz (91.4 kg)   SpO2 97%   BMI 36.27 kg/m   Wt Readings from Last 3 Encounters:  11/22/22 201 lb 8 oz (91.4 kg)  05/24/22 204 lb 6.4 oz (92.7 kg)  01/27/22 204 lb 2 oz (92.6 kg)      Physical Exam Vitals and nursing note reviewed.  Constitutional:      General: He is not in acute distress.    Appearance: Normal appearance. He is well-developed. He is not ill-appearing.  HENT:     Head: Normocephalic and atraumatic.     Right Ear: Hearing, tympanic membrane, ear canal and external ear normal.     Left Ear: Hearing, tympanic membrane, ear canal and external ear normal.     Nose: Nose normal.     Mouth/Throat:     Mouth: Mucous membranes are moist.     Pharynx: Oropharynx is clear. No oropharyngeal exudate or posterior oropharyngeal erythema.  Eyes:     General: No scleral icterus.    Extraocular Movements: Extraocular  movements intact.     Conjunctiva/sclera: Conjunctivae normal.     Pupils: Pupils are equal, round, and reactive to light.  Neck:     Thyroid: No thyroid mass or thyromegaly.     Vascular: No carotid bruit.  Cardiovascular:     Rate and Rhythm: Normal rate and regular rhythm.     Pulses: Normal pulses.  Radial pulses are 2+ on the right side and 2+ on the left side.     Heart sounds: Normal heart sounds. No murmur heard. Pulmonary:     Effort: Pulmonary effort is normal. No respiratory distress.     Breath sounds: Normal breath sounds. No wheezing, rhonchi or rales.  Abdominal:     General: Bowel sounds are normal. There is no distension.     Palpations: Abdomen is soft. There is no mass.     Tenderness: There is no abdominal tenderness. There is no guarding or rebound.     Hernia: No hernia is present.  Musculoskeletal:        General: Normal range of motion.     Cervical back: Normal range of motion and neck supple.     Right lower leg: No edema.     Left lower leg: No edema.  Lymphadenopathy:     Cervical: No cervical adenopathy.  Skin:    General: Skin is warm and dry.     Findings: No rash.  Neurological:     General: No focal deficit present.     Mental Status: He is alert and oriented to person, place, and time.     Motor: Tremor (mild) present.     Comments:  Recall 1/3, 3/3 with cue Calculation 5/5 DLROW  Psychiatric:        Mood and Affect: Mood normal.        Behavior: Behavior normal.        Thought Content: Thought content normal.        Judgment: Judgment normal.       Results for orders placed or performed in visit on 05/24/22  Renal function panel  Result Value Ref Range   Sodium 140 135 - 145 mEq/L   Potassium 4.8 3.5 - 5.1 mEq/L   Chloride 106 96 - 112 mEq/L   CO2 28 19 - 32 mEq/L   Albumin 4.2 3.5 - 5.2 g/dL   BUN 29 (H) 6 - 23 mg/dL   Creatinine, Ser 4.09 (H) 0.40 - 1.50 mg/dL   Glucose, Bld 85 70 - 99 mg/dL   Phosphorus 2.7 2.3 - 4.6  mg/dL   GFR 81.19 (L) >14.78 mL/min   Calcium 8.9 8.4 - 10.5 mg/dL    Assessment & Plan:   Problem List Items Addressed This Visit     HTN (hypertension)    Chronic, above goal today - he hasn't taken BP meds yet today BP log provided for him to monitor at home and drop off log in 2 wks to review.       History of stroke    Continues plavix, statin.       Carotid stenosis, bilateral    Followed by [facility] s/p L CEA       TIA (transient ischemic attack)   Relevant Medications   clopidogrel (PLAVIX) 75 MG tablet   Hyperlipidemia LDL goal <70    Chronic, continues atorvastatin 80 mg daily. Update FLP. The ASCVD Risk score (Arnett DK, et al., 2019) failed to calculate for the following reasons:   The 2019 ASCVD risk score is only valid for ages 4 to 72   The patient has a prior MI or stroke diagnosis       Relevant Orders   Lipid panel   Comprehensive metabolic panel   Severe obesity (BMI 35.0-39.9) with comorbidity (HCC)    Encouraged healthy diet and lifestyle choices. He notes anorexia since COVID infection several years ago.  Encouraged limiting alcohol intake.  Obesity complicated by comorbidities of CKD, HTN, HLD, h/o stroke, and PAD.       Relevant Medications   dapagliflozin propanediol (FARXIGA) 5 MG TABS tablet   Medicare annual wellness visit, subsequent - Primary (Chronic)    I have personally reviewed the Medicare Annual Wellness questionnaire and have noted 1. The patient's medical and social history 2. Their use of alcohol, tobacco or illicit drugs 3. Their current medications and supplements 4. The patient's functional ability including ADL's, fall risks, home safety risks and hearing or visual impairment. Cognitive function has been assessed and addressed as indicated.  5. Diet and physical activity 6. Evidence for depression or mood disorders The patients weight, height, BMI have been recorded in the chart. I have made referrals, counseling and provided  education to the patient based on review of the above and I have provided the pt with a written personalized care plan for preventive services. Provider list updated.. See scanned questionairre as needed for further documentation. Reviewed preventative protocols and updated unless pt declined.       Advanced care planning/counseling discussion (Chronic)    Advanced directives - unsure if he has living will. Does not want prolonged life support if terminal condition - doesn't want to be "burden". Would want daughters to be HCPOA. Packet provided previously.       CKD (chronic kidney disease) stage 3, GFR 30-59 ml/min (HCC)    Update labs. He continues ACEI and farxiga 5mg  - consider increasing to 10 pending results.       Relevant Medications   dapagliflozin propanediol (FARXIGA) 5 MG TABS tablet   Other Relevant Orders   Comprehensive metabolic panel   Phosphorus   VITAMIN D 25 Hydroxy (Vit-D Deficiency, Fractures)   Microalbumin / creatinine urine ratio   Parathyroid hormone, intact (no Ca)   CBC with Differential/Platelet   PVD (peripheral vascular disease) with claudication (HCC)    Sees [facility] on plavix and statin.       Lymphocytosis    Update CBC.       MGUS (monoclonal gammopathy of unknown significance)    Check protein levels, CBC. If abnormal, low threshold to update SPEP       Habitual alcohol use    Reviewed alcohol use - 3-4 beers/day every day.  Encouraged limiting to 2 or less/day.  Check labs including B12 and folate       Relevant Orders   Comprehensive metabolic panel   Vitamin B12   Folate   CBC with Differential/Platelet     Meds ordered this encounter  Medications   clopidogrel (PLAVIX) 75 MG tablet    Sig: Take 1 tablet (75 mg total) by mouth daily.    Dispense:  90 tablet    Refill:  4   dapagliflozin propanediol (FARXIGA) 5 MG TABS tablet    Sig: Take 1 tablet (5 mg total) by mouth daily before breakfast.    Dispense:  90 tablet    Refill:   4    Orders Placed This Encounter  Procedures   Lipid panel   Comprehensive metabolic panel   Vitamin B12   Folate   Phosphorus   VITAMIN D 25 Hydroxy (Vit-D Deficiency, Fractures)   Microalbumin / creatinine urine ratio   Parathyroid hormone, intact (no Ca)   CBC with Differential/Platelet    Patient Instructions  Labs today  Ok to take medications on empty stomach especially if coming in for office visit.  BP was  too high today - keep track at home with log provided today, drop off in 1-2 weeks to review readings. Check at home if you have a living will/advanced directive section to your regular will and if so, bring [place] a copy.  Good to see you today Return in 6 months for follow up visit , sooner if needed  Follow up plan: Return in about 6 months (around 05/25/2023), or if symptoms worsen or fail to improve, for follow up visit.  Eustaquio Boyden, MD

## 2022-11-22 NOTE — Assessment & Plan Note (Signed)
Update CBC. 

## 2022-11-22 NOTE — Assessment & Plan Note (Addendum)
Check protein levels, CBC. If abnormal, low threshold to update SPEP

## 2022-11-22 NOTE — Assessment & Plan Note (Signed)

## 2022-11-22 NOTE — Assessment & Plan Note (Signed)
Reviewed alcohol use - 3-4 beers/day every day.  Encouraged limiting to 2 or less/day.  Check labs including B12 and folate

## 2022-11-22 NOTE — Assessment & Plan Note (Signed)
Encouraged healthy diet and lifestyle choices. He notes anorexia since COVID infection several years ago. Encouraged limiting alcohol intake.  Obesity complicated by comorbidities of CKD, HTN, HLD, h/o stroke, and PAD.

## 2022-11-22 NOTE — Assessment & Plan Note (Signed)
Chronic, above goal today - he hasn't taken BP meds yet today BP log provided for him to monitor at home and drop off log in 2 wks to review.

## 2022-11-23 LAB — PARATHYROID HORMONE, INTACT (NO CA): PTH: 24 pg/mL (ref 16–77)

## 2022-11-29 DIAGNOSIS — H35353 Cystoid macular degeneration, bilateral: Secondary | ICD-10-CM | POA: Diagnosis not present

## 2022-12-20 DIAGNOSIS — D0439 Carcinoma in situ of skin of other parts of face: Secondary | ICD-10-CM | POA: Diagnosis not present

## 2022-12-20 DIAGNOSIS — Z08 Encounter for follow-up examination after completed treatment for malignant neoplasm: Secondary | ICD-10-CM | POA: Diagnosis not present

## 2022-12-20 DIAGNOSIS — L821 Other seborrheic keratosis: Secondary | ICD-10-CM | POA: Diagnosis not present

## 2022-12-20 DIAGNOSIS — D2262 Melanocytic nevi of left upper limb, including shoulder: Secondary | ICD-10-CM | POA: Diagnosis not present

## 2022-12-20 DIAGNOSIS — D225 Melanocytic nevi of trunk: Secondary | ICD-10-CM | POA: Diagnosis not present

## 2022-12-20 DIAGNOSIS — Z85828 Personal history of other malignant neoplasm of skin: Secondary | ICD-10-CM | POA: Diagnosis not present

## 2022-12-20 DIAGNOSIS — D2261 Melanocytic nevi of right upper limb, including shoulder: Secondary | ICD-10-CM | POA: Diagnosis not present

## 2022-12-20 DIAGNOSIS — L570 Actinic keratosis: Secondary | ICD-10-CM | POA: Diagnosis not present

## 2022-12-20 DIAGNOSIS — D485 Neoplasm of uncertain behavior of skin: Secondary | ICD-10-CM | POA: Diagnosis not present

## 2023-01-23 ENCOUNTER — Other Ambulatory Visit: Payer: Self-pay | Admitting: Cardiovascular Disease

## 2023-02-22 DIAGNOSIS — H43813 Vitreous degeneration, bilateral: Secondary | ICD-10-CM | POA: Diagnosis not present

## 2023-02-22 DIAGNOSIS — H35352 Cystoid macular degeneration, left eye: Secondary | ICD-10-CM | POA: Diagnosis not present

## 2023-02-22 DIAGNOSIS — H35373 Puckering of macula, bilateral: Secondary | ICD-10-CM | POA: Diagnosis not present

## 2023-02-26 ENCOUNTER — Other Ambulatory Visit: Payer: Self-pay | Admitting: Cardiovascular Disease

## 2023-02-28 DIAGNOSIS — D0439 Carcinoma in situ of skin of other parts of face: Secondary | ICD-10-CM | POA: Diagnosis not present

## 2023-02-28 NOTE — Telephone Encounter (Signed)
Pt is scheduled on 8/20.

## 2023-02-28 NOTE — Telephone Encounter (Signed)
last visit on 01/27/22 with Dr. Mariah Milling with plan to f/u in 1 yr.  Please call to schedule appt.

## 2023-02-28 NOTE — Progress Notes (Unsigned)
Patient ID: Craig Avery, male   DOB: 24-Dec-1940, 82 y.o.   MRN: [id number] Cardiology Office Note  Date:  03/01/2023   ID:  Craig Avery, Craig Avery 03/25/1941, MRN [id number]  PCP:  Craig Boyden, MD   Chief Complaint  Patient presents with   Follow-up    12 month follow up visit. Patient states that he feels fine on today.Meds reviewed.     HPI:  82 year-old gentleman with history of hypertension, long history of poorly controlled peripheral vascular disease, Carotid endarterectomy on the left July 2014, smoking history , 20 to 30 yr hospitalization x2 at [facility], L sided paresthesias, Found to have stroke (acute right PCA distribution ischemic infarction involving the right occipital region and right thalamus along with severe R PCA), returned a few days later to ER hypertensive to >200 systolic,  With medication management for blood pressure Chronic renal failure creatinine 1.5 He presents for follow-up of his PAD, hypertension  Last seen in clinic by myself July 2023  BP elevated today, 188/78 Skin procedure yesterday with dermatology Reports he did not eat anything today  Pressure at home 130 to 150, rare 160 systolic Rare fluttering, few minutes once a month  Event monitor in 2023, no atrial fibrillation noted  Sedentary at baseline, watches TV Sits in the chair all day  Family does his medication pillbox In the past at times will miss the morning doses, tries to remember the evening doses  No chest pain on exertion  5/23: carotid u/s Right carotid: Velocities in the right ICA are consistent with a 1-39%  stenosis. The ECA appears >50% stenosed.  Left Carotid: Patent carotid endarterectomy with velocity in the left ICA   consistent with a 1-39% stenosis.  Labs reviewed: CR 1.67 Total cholesterol 162 LDL 76  EKG personally reviewed by myself on todays visit EKG Interpretation Date/Time:  Tuesday March 01 2023 16:27:05 EDT Ventricular Rate:  77 PR  Interval:  168 QRS Duration:  76 QT Interval:  374 QTC Calculation: 423 R Axis:   5  Text Interpretation: Normal sinus rhythm with sinus arrhythmia Possible Inferior infarct , age undetermined Anterior infarct (cited on or before 06-Jul-2019) When compared with ECG of 06-Jul-2019 11:37, Borderline criteria for Inferior infarct are now Present Nonspecific T wave abnormality now evident in Anterior leads Confirmed by Julien Nordmann [phone]) on 03/01/2023 4:43:56 PM   Other past medical history reviewed Chronic back pain  LE arterial Right: 50-74% stenosis noted in the common femoral artery. 30-49% stenosis noted in the superficial femoral artery. Left: 30-49% stenosis noted in the popliteal artery.  Initial MRI/MRA: 1. Small acute infarcts in the right hippocampus and right occipital lobe, implicating right PCA territory ischemia. No mass effect or hemorrhage.   2. Chronic small vessel ischemia in the left basal ganglia and possibly also the medial left thalamus.   1. High-grade stenosis or focal near occlusion of the right PCA distal P2 segment with attenuated more distal right PCA flow.   2. Moderate to severe focal stenosis of the left PCA P2 segment.   3. 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   4. Mild to moderate anterior circulation atherosclerosis, most pronounced in the MCA M1 segments.   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  PSH:    Past Surgical History:  Procedure Laterality Date   CAROTID ENDARTERECTOMY Left 07-21-12   cea   CATARACT EXTRACTION  2004   bilateral   COLONOSCOPY WITH PROPOFOL N/A 06/24/2015   3 polyps, one with  high grade dysplasia, rpt 3 mo; Midge Minium, MD   COLONOSCOPY WITH PROPOFOL N/A 10/14/2015   hyperplastic polyp; f/u PRN; Midge Minium, MD   ENDARTERECTOMY  07/21/2012   Left CEA; Surgeon: Nada Libman, MD   EYE SURGERY     MELANOMA EXCISION  12/30/2016   right side of head above ear   PATCH ANGIOPLASTY  07/21/2012   Procedure: PATCH ANGIOPLASTY;  Surgeon: Nada Libman, MD;  Location: [facility] OR;  Service: Vascular;  Laterality: Left;  Using 1 cm x 6 cm Vascu Guard patch    TEE WITHOUT CARDIOVERSION  05/22/2012   Procedure: TRANSESOPHAGEAL ECHOCARDIOGRAM (TEE);  Surgeon: Lewayne Bunting, MD;  Location[facility] ENDOSCOPY;  Service: Cardiovascular;  Laterality: N/A;   WISDOM TOOTH EXTRACTION      Current Outpatient Medications  Medication Sig Dispense Refill   amLODipine (NORVASC) 10 MG tablet Take 1 tablet (10 mg total) by mouth daily. 90 tablet 3   atorvastatin (LIPITOR) 80 MG tablet Take 1 tablet (80 mg total) by mouth daily. PLEASE CALL OFFICE TO SCHEDULE APPOINTMENT PRIOR TO NEXT REFILL (1st attempt) 30 tablet 0   carvedilol (COREG) 12.5 MG tablet Take 1 tablet (12.5 mg total) by mouth 2 (two) times daily. 180 tablet 3   cloNIDine (CATAPRES) 0.1 MG tablet Take 1 tablet (0.1 mg total) by mouth 2 (two) times daily. PLEASE CALL OFFICE TO SCHEDULE APPOINTMENT PRIOR TO NEXT REFILL (1st attempt) 60 tablet 0   clopidogrel (PLAVIX) 75 MG tablet Take 1 tablet (75 mg total) by mouth daily. 90 tablet 4   dapagliflozin propanediol (FARXIGA) 5 MG TABS tablet Take 1 tablet (5 mg total) by mouth daily before breakfast. 90 tablet 4   Docusate Calcium (STOOL SOFTENER PO) Take by mouth daily.     hydrALAZINE (APRESOLINE) 25 MG tablet Take 1 tablet (25 mg total) by mouth 3 (three) times daily as needed. Take for SBP >160 180 tablet 3   lisinopril (ZESTRIL) 20 MG tablet Take 1 tablet (20 mg total) by mouth daily. PLEASE CALL OFFICE TO SCHEDULE APPOINTMENT PRIOR TO NEXT REFILL (first attempt) 30 tablet 0    loratadine (CLARITIN) 10 MG tablet Take 10 mg by mouth daily.     Multiple Vitamins-Minerals (CENTRUM SILVER ADULT 50+ PO) Take 1 tablet by mouth daily.     No current facility-administered medications for this visit.    Allergies:   Patient has no known allergies.   Social History:  The patient  reports that he quit smoking about 28 years ago. His smoking use included cigarettes. He started smoking about 61 years ago. He has a 49.5 pack-year smoking history. He has never used smokeless tobacco. He reports current alcohol use of about 8.0 - 10.0 standard drinks of alcohol per week. He reports that he does not use drugs.   Family History:   family history includes Aneurysm (age of onset: 82) in his paternal uncle; CAD (age of onset: 80) in his brother; Cirrhosis (age of onset: 85) in his father; Heart attack in his brother; Heart disease in his brother and daughter; Hyperlipidemia in his daughter; Hypertension in his mother.    Review of Systems: Review of Systems  Constitutional: Negative.   Respiratory: Negative.  Cardiovascular: Negative.   Gastrointestinal: Negative.   Musculoskeletal: Negative.   Neurological: Negative.   Psychiatric/Behavioral: Negative.    All other systems reviewed and are negative.   PHYSICAL EXAM: VS:  BP (!) 188/78 (BP Location: Left Arm, Patient Position: Sitting, Cuff Size: Normal)   Pulse 77   Ht 5\' 5"  (1.651 m)   Wt 200 lb 6.4 oz (90.9 kg)   SpO2 95%   BMI 33.35 kg/m  , BMI Body mass index is 33.35 kg/m. Constitutional:  oriented to person, place, and time. No distress.  HENT:  Head: Grossly normal Eyes:  no discharge. No scleral icterus.  Neck: No JVD, no carotid bruits  Cardiovascular: Regular rate and rhythm, no murmurs appreciated Pulmonary/Chest: Clear to auscultation bilaterally, no wheezes or rails Abdominal: Soft.  no distension.  no tenderness.  Musculoskeletal: Normal range of motion Neurological:  normal muscle tone. Coordination  normal. No atrophy Skin: Skin warm and dry Psychiatric: normal affect, pleasant   Recent Labs: 11/22/2022: ALT 23; BUN 22; Creatinine, Ser 1.67; Hemoglobin 14.9; Platelets 278.0; Potassium 4.5; Sodium 141    Lipid Panel Lab Results  Component Value Date   CHOL 162 11/22/2022   HDL 64.40 11/22/2022   LDLCALC 76 11/22/2022   TRIG 109.0 11/22/2022    Wt Readings from Last 3 Encounters:  03/01/23 200 lb 6.4 oz (90.9 kg)  11/22/22 201 lb 8 oz (91.4 kg)  05/24/22 204 lb 6.4 oz (92.7 kg)     ASSESSMENT AND PLAN:  Essential hypertension -  In the past has reported missing medication doses Blood pressure markedly elevated on today's visit, unclear if he took clonidine this morning Clonidine 0.1 mg pill was provided to him in the office as he was going out to dinner with family We recommended he increase carvedilol up to 25 twice daily given high blood pressure and continued episodes of fluttering Stressed importance of following his pillbox medications No family available on today's visit for further discussion  Hyperlipidemia Stressed the importance of staying on his Lipitor  Carotid stenosis, bilateral Ultrasound detailing stable mild disease bilaterally,  No further intervention needed Goal LDL less than 70  CKD (chronic kidney disease) stage 3, GFR 30-59 ml/min Stable renal function, creatinine 1.6 Stressed importance of aggressive blood pressure control Avoid NSAIDs  Paroxysmal tachycardia A Zio monitor performed in 2023, no atrial fibrillation flutter But he does have a history of strokes Carvedilol up to 25 twice daily    Total encounter time more than 30 minutes  Greater than 50% was spent in counseling and coordination of care with the patient    Orders Placed This Encounter  Procedures   EKG 12-Lead      Signed, Dossie Arbour, M.D., Ph.D. 03/01/2023  [facility] [place], [place] [phone]

## 2023-03-01 ENCOUNTER — Ambulatory Visit: Payer: Medicare Other | Attending: Cardiovascular Disease | Admitting: Cardiovascular Disease

## 2023-03-01 ENCOUNTER — Encounter: Payer: Self-pay | Admitting: Cardiovascular Disease

## 2023-03-01 VITALS — BP 188/78 | HR 77 | Ht 65.0 in | Wt 200.4 lb

## 2023-03-01 DIAGNOSIS — I739 Peripheral vascular disease, unspecified: Secondary | ICD-10-CM | POA: Diagnosis not present

## 2023-03-01 DIAGNOSIS — I6523 Occlusion and stenosis of bilateral carotid arteries: Secondary | ICD-10-CM | POA: Diagnosis not present

## 2023-03-01 DIAGNOSIS — I479 Paroxysmal tachycardia, unspecified: Secondary | ICD-10-CM | POA: Insufficient documentation

## 2023-03-01 DIAGNOSIS — I10 Essential (primary) hypertension: Secondary | ICD-10-CM | POA: Diagnosis not present

## 2023-03-01 DIAGNOSIS — E785 Hyperlipidemia, unspecified: Secondary | ICD-10-CM | POA: Insufficient documentation

## 2023-03-01 DIAGNOSIS — N1832 Chronic kidney disease, stage 3b: Secondary | ICD-10-CM | POA: Diagnosis not present

## 2023-03-01 MED ORDER — CARVEDILOL 25 MG PO TABS: 25.0000 mg | ORAL_TABLET | Freq: Two times a day (BID) | ORAL | 3 refills | Status: DC

## 2023-03-01 MED ORDER — CLONIDINE HCL 0.1 MG PO TABS
0.1000 mg | ORAL_TABLET | Freq: Once | ORAL | Status: AC
Start: 2023-03-01 — End: 2023-03-01
  Administered 2023-03-01: 0.1 mg via ORAL

## 2023-03-01 NOTE — Patient Instructions (Signed)
Medication Instructions:  Clonidine 0.1 mg pill here in office  Please increase the coreg up to 25 mg twice a day  If you need a refill on your cardiac medications before your next appointment, please call your pharmacy.   Lab work: No new labs needed  Testing/Procedures: No new testing needed  Follow-Up: At [facility], you and your health needs are our priority.  As part of our continuing mission to provide you with exceptional heart care, we have created designated Provider Care Teams.  These Care Teams include your primary Cardiologist (physician) and Advanced Practice Providers (APPs -  Physician Assistants and Nurse Practitioners) who all work together to provide you with the care you need, when you need it.  You will need a follow up appointment in 12 months  Providers on your designated Care Team:   Nicolasa Ducking, NP Eula Listen, PA-C Cadence Fransico Michael, [place]  COVID-19 Vaccine Information can be found at: [email or web address] For questions related to vaccine distribution or appointments, please email [email or web address] or call [phone].

## 2023-03-07 DIAGNOSIS — B029 Zoster without complications: Secondary | ICD-10-CM | POA: Diagnosis not present

## 2023-03-08 ENCOUNTER — Encounter: Payer: Self-pay | Admitting: Family Medicine

## 2023-03-25 ENCOUNTER — Other Ambulatory Visit: Payer: Self-pay | Admitting: Cardiovascular Disease

## 2023-03-25 MED ORDER — GABAPENTIN 300 MG PO CAPS: 300.0000 mg | ORAL_CAPSULE | Freq: Every day | ORAL | 3 refills | Status: DC

## 2023-03-25 NOTE — Addendum Note (Signed)
Addended by: Eustaquio Boyden on: 03/25/2023 05:59 PM   Modules accepted: Orders

## 2023-04-20 ENCOUNTER — Other Ambulatory Visit: Payer: Self-pay | Admitting: Cardiovascular Disease

## 2023-04-24 ENCOUNTER — Other Ambulatory Visit: Payer: Self-pay | Admitting: Cardiovascular Disease

## 2023-04-25 DIAGNOSIS — H35352 Cystoid macular degeneration, left eye: Secondary | ICD-10-CM | POA: Diagnosis not present

## 2023-04-25 DIAGNOSIS — H43813 Vitreous degeneration, bilateral: Secondary | ICD-10-CM | POA: Diagnosis not present

## 2023-04-25 DIAGNOSIS — H35373 Puckering of macula, bilateral: Secondary | ICD-10-CM | POA: Diagnosis not present

## 2023-05-25 ENCOUNTER — Ambulatory Visit (INDEPENDENT_AMBULATORY_CARE_PROVIDER_SITE_OTHER): Payer: Medicare Other | Admitting: Family Medicine

## 2023-05-25 ENCOUNTER — Encounter: Payer: Self-pay | Admitting: Family Medicine

## 2023-05-25 VITALS — BP 132/78 | HR 80 | Temp 98.1°F | Wt 194.2 lb

## 2023-05-25 DIAGNOSIS — E66811 Obesity, class 1: Secondary | ICD-10-CM

## 2023-05-25 DIAGNOSIS — R630 Anorexia: Secondary | ICD-10-CM | POA: Diagnosis not present

## 2023-05-25 DIAGNOSIS — N1832 Chronic kidney disease, stage 3b: Secondary | ICD-10-CM | POA: Diagnosis not present

## 2023-05-25 DIAGNOSIS — I10 Essential (primary) hypertension: Secondary | ICD-10-CM

## 2023-05-25 DIAGNOSIS — Z23 Encounter for immunization: Secondary | ICD-10-CM | POA: Diagnosis not present

## 2023-05-25 DIAGNOSIS — D472 Monoclonal gammopathy: Secondary | ICD-10-CM

## 2023-05-25 LAB — CBC WITH DIFFERENTIAL/PLATELET
Basophils Absolute: 0.1 10*3/uL (ref 0.0–0.1)
Basophils Relative: 0.7 % (ref 0.0–3.0)
Eosinophils Absolute: 0.2 10*3/uL (ref 0.0–0.7)
Eosinophils Relative: 2.2 % (ref 0.0–5.0)
HCT: 37.6 % — ABNORMAL LOW (ref 39.0–52.0)
Hemoglobin: 12.6 g/dL — ABNORMAL LOW (ref 13.0–17.0)
Lymphocytes Relative: 39.4 % (ref 12.0–46.0)
Lymphs Abs: 3 10*3/uL (ref 0.7–4.0)
MCHC: 33.5 g/dL (ref 30.0–36.0)
MCV: 97.1 fL (ref 78.0–100.0)
Monocytes Absolute: 0.5 10*3/uL (ref 0.1–1.0)
Monocytes Relative: 6.4 % (ref 3.0–12.0)
Neutro Abs: 4 10*3/uL (ref 1.4–7.7)
Neutrophils Relative %: 51.3 % (ref 43.0–77.0)
Platelets: 278 10*3/uL (ref 150.0–400.0)
RBC: 3.88 Mil/uL — ABNORMAL LOW (ref 4.22–5.81)
RDW: 13.7 % (ref 11.5–15.5)
WBC: 7.7 10*3/uL (ref 4.0–10.5)

## 2023-05-25 LAB — COMPREHENSIVE METABOLIC PANEL
ALT: 26 U/L (ref 0–53)
AST: 26 U/L (ref 0–37)
Albumin: 4.1 g/dL (ref 3.5–5.2)
Alkaline Phosphatase: 75 U/L (ref 39–117)
BUN: 27 mg/dL — ABNORMAL HIGH (ref 6–23)
CO2: 27 meq/L (ref 19–32)
Calcium: 8.6 mg/dL (ref 8.4–10.5)
Chloride: 105 meq/L (ref 96–112)
Creatinine, Ser: 2.08 mg/dL — ABNORMAL HIGH (ref 0.40–1.50)
GFR: 28.78 mL/min — ABNORMAL LOW (ref >60.00)
Glucose, Bld: 101 mg/dL — ABNORMAL HIGH (ref 70–99)
Potassium: 4.6 meq/L (ref 3.5–5.1)
Sodium: 140 meq/L (ref 135–145)
Total Bilirubin: 0.5 mg/dL (ref 0.2–1.2)
Total Protein: 6.7 g/dL (ref 6.0–8.3)

## 2023-05-25 LAB — SEDIMENTATION RATE: Sed Rate: 9 mm/h (ref 0–20)

## 2023-05-25 NOTE — Patient Instructions (Addendum)
Flu shot today  Labs today  Consider boost or ensure supplement to help stimulate appetite, especially on days you don't eat much.  If liver ok, we may start oral antifungal terbinafine daily for 6 weeks.  We will be in touch with results.  Return in 6 months for medicare wellness visit

## 2023-05-25 NOTE — Assessment & Plan Note (Signed)
Chronic, good control on current regimen - continue. Appreciate cards care. He states he is taking clonidine although last filled 01/2023 - had enough at home.

## 2023-05-25 NOTE — Assessment & Plan Note (Signed)
Ongoing issue for the past 3 years. Overall asxs. Check labs today for further evaluation.

## 2023-05-25 NOTE — Assessment & Plan Note (Signed)
Update kidney function.  

## 2023-05-25 NOTE — Assessment & Plan Note (Signed)
Update SPEP

## 2023-05-25 NOTE — Progress Notes (Signed)
Ph: [phone] Fax: [phone]   Patient ID: Craig Avery, male    DOB: 10-Feb-1941, 82 y.o.   MRN: [id number]  This visit was conducted in person.  BP 132/78 (BP Location: Left Arm, Patient Position: Sitting, Cuff Size: Large)   Pulse 80   Temp 98.1 F (36.7 C) (Oral)   Wt 194 lb 3.2 oz (88.1 kg)   SpO2 99%   BMI 32.32 kg/m    CC: 6 mo f/u visit  Subjective:   HPI: Craig Avery is a 82 y.o. male presenting on 05/25/2023 for Medical Management of Chronic Issues (Nonfasting. 6 month follow up. Patient hasn't had much of an appetite in the past 3-4 years. Left great toe nail lifting. )   Daughter Billey Gosling fills his pill box weekly.   Trouble with no appetite for 3 years - since he had COVID. Often skips breakfast and lunch. 7 lb weight loss over the past 6 months. No abd pain, nausea/vomiting, diarrhea/constipation, bowel changes, blood in stool or urine. Taste and smell intact. No fevers/chills, night sweats or significant fatigue, no swollen glands.  Lab Results  Component Value Date   HGBA1C 6.1 (H) 07/19/2019    Notes white spot to L toenail - enlarging over the past month. Asxs. Denies inciting trauma or injury   Shingles case 02/2023 treated with valtrex, we added gabapentin - he is now off this with improvement in symptoms.   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   Daughter helps fill pill box.   HTN - stable period on  amlodipine 10mg  daily, carvedilol 25mg  bid, clonidine 0.1mg  bid, lisinopril 20mg  daily, and hydralazine 25mg  TID PRN. No HA, vision changes, CP/tightness, SOB, leg swelling.      Relevant past medical, surgical, family and social history reviewed and updated as indicated. Interim medical history since our last visit reviewed. Allergies and medications reviewed and updated. Outpatient Medications Prior to Visit  Medication Sig Dispense Refill   amLODipine (NORVASC) 10 MG tablet Take 1 tablet (10 mg  total) by mouth daily. 90 tablet 3   atorvastatin (LIPITOR) 80 MG tablet Take 1 tablet (80 mg total) by mouth daily. 90 tablet 2   carvedilol (COREG) 25 MG tablet Take 1 tablet (25 mg total) by mouth 2 (two) times daily. 180 tablet 3   cloNIDine (CATAPRES) 0.1 MG tablet Take 1 tablet (0.1 mg total) by mouth 2 (two) times daily. PLEASE CALL OFFICE TO SCHEDULE APPOINTMENT PRIOR TO NEXT REFILL (1st attempt) 60 tablet 0   clopidogrel (PLAVIX) 75 MG tablet Take 1 tablet (75 mg total) by mouth daily. 90 tablet 4   dapagliflozin propanediol (FARXIGA) 5 MG TABS tablet Take 1 tablet (5 mg total) by mouth daily before breakfast. 90 tablet 4   Docusate Calcium (STOOL SOFTENER PO) Take by mouth daily.     hydrALAZINE (APRESOLINE) 25 MG tablet Take 1 tablet (25 mg total) by mouth 3 (three) times daily as needed. Take for SBP >160 180 tablet 3   lisinopril (ZESTRIL) 20 MG tablet Take 1 tablet (20 mg total) by mouth daily. 90 tablet 2   loratadine (CLARITIN) 10 MG tablet Take 10 mg by mouth daily.     Multiple Vitamins-Minerals (CENTRUM SILVER ADULT 50+ PO) Take 1 tablet by mouth daily.     gabapentin (NEURONTIN) 300 MG capsule Take 1 capsule (300 mg total) by mouth at bedtime. For shingles pain 30 capsule 3   No facility-administered medications prior  to visit.     Per HPI unless specifically indicated in ROS section below Review of Systems  Objective:  BP 132/78 (BP Location: Left Arm, Patient Position: Sitting, Cuff Size: Large)   Pulse 80   Temp 98.1 F (36.7 C) (Oral)   Wt 194 lb 3.2 oz (88.1 kg)   SpO2 99%   BMI 32.32 kg/m   Wt Readings from Last 3 Encounters:  05/25/23 194 lb 3.2 oz (88.1 kg)  03/01/23 200 lb 6.4 oz (90.9 kg)  11/22/22 201 lb 8 oz (91.4 kg)      Physical Exam Vitals and nursing note reviewed.  Constitutional:      Appearance: Normal appearance. He is not ill-appearing.  HENT:     Head: Normocephalic and atraumatic.     Mouth/Throat:     Mouth: Mucous membranes are  moist.     Pharynx: Oropharynx is clear. No oropharyngeal exudate or posterior oropharyngeal erythema.  Eyes:     Extraocular Movements: Extraocular movements intact.     Pupils: Pupils are equal, round, and reactive to light.  Neck:     Thyroid: No thyroid mass or thyromegaly.  Cardiovascular:     Rate and Rhythm: Normal rate and regular rhythm.     Pulses: Normal pulses.     Heart sounds: Normal heart sounds. No murmur heard. Pulmonary:     Effort: Pulmonary effort is normal. No respiratory distress.     Breath sounds: Normal breath sounds. No wheezing, rhonchi or rales.  Abdominal:     General: Bowel sounds are normal. There is no distension.     Palpations: Abdomen is soft. There is no mass.     Tenderness: There is no abdominal tenderness. There is no guarding or rebound.     Hernia: No hernia is present.  Musculoskeletal:     Cervical back: Normal range of motion and neck supple.     Right lower leg: No edema.     Left lower leg: No edema.  Skin:    General: Skin is warm and dry.     Findings: No rash.     Comments:  L great toenail with onychomycosis to lateral nailfold and onycholysis   Neurological:     Mental Status: He is alert.  Psychiatric:        Mood and Affect: Mood normal.        Behavior: Behavior normal.       Results for orders placed or performed in visit on 11/22/22  Lipid panel  Result Value Ref Range   Cholesterol 162 0 - 200 mg/dL   Triglycerides 469.6 0.0 - 149.0 mg/dL   HDL 29.52 >84.13 mg/dL   VLDL 24.4 0.0 - 01.0 mg/dL   LDL Cholesterol 76 0 - 99 mg/dL   Total CHOL/HDL Ratio 3    NonHDL 97.30   Comprehensive metabolic panel  Result Value Ref Range   Sodium 141 135 - 145 mEq/L   Potassium 4.5 3.5 - 5.1 mEq/L   Chloride 106 96 - 112 mEq/L   CO2 24 19 - 32 mEq/L   Glucose, Bld 96 70 - 99 mg/dL   BUN 22 6 - 23 mg/dL   Creatinine, Ser 2.72 (H) 0.40 - 1.50 mg/dL   Total Bilirubin 0.8 0.2 - 1.2 mg/dL   Alkaline Phosphatase 67 39 - 117 U/L    AST 21 0 - 37 U/L   ALT 23 0 - 53 U/L   Total Protein 7.4 6.0 - 8.3 g/dL  Albumin 4.5 3.5 - 5.2 g/dL   GFR 95.28 (L) >41.32 mL/min   Calcium 9.1 8.4 - 10.5 mg/dL  Vitamin G40  Result Value Ref Range   Vitamin B-12 556 211 - 911 pg/mL  Folate  Result Value Ref Range   Folate >23.9 >5.9 ng/mL  Phosphorus  Result Value Ref Range   Phosphorus 3.4 2.3 - 4.6 mg/dL  VITAMIN D 25 Hydroxy (Vit-D Deficiency, Fractures)  Result Value Ref Range   VITD 50.34 30.00 - 100.00 ng/mL  Microalbumin / creatinine urine ratio  Result Value Ref Range   Microalb, Ur 3.6 (H) 0.0 - 1.9 mg/dL   Creatinine,U 10.2 mg/dL   Microalb Creat Ratio 11.3 0.0 - 30.0 mg/g  Parathyroid hormone, intact (no Ca)  Result Value Ref Range   PTH 24 16 - 77 pg/mL  CBC with Differential/Platelet  Result Value Ref Range   WBC 10.0 4.0 - 10.5 K/uL   RBC 4.68 4.22 - 5.81 Mil/uL   Hemoglobin 14.9 13.0 - 17.0 g/dL   HCT 72.5 36.6 - 44.0 %   MCV 94.4 78.0 - 100.0 fl   MCHC 33.7 30.0 - 36.0 g/dL   RDW 34.7 42.5 - 95.6 %   Platelets 278.0 150.0 - 400.0 K/uL   Neutrophils Relative % 51.6 43.0 - 77.0 %   Lymphocytes Relative 42.1 12.0 - 46.0 %   Monocytes Relative 4.7 3.0 - 12.0 %   Eosinophils Relative 1.1 0.0 - 5.0 %   Basophils Relative 0.5 0.0 - 3.0 %   Neutro Abs 5.2 1.4 - 7.7 K/uL   Lymphs Abs 4.2 (H) 0.7 - 4.0 K/uL   Monocytes Absolute 0.5 0.1 - 1.0 K/uL   Eosinophils Absolute 0.1 0.0 - 0.7 K/uL   Basophils Absolute 0.1 0.0 - 0.1 K/uL      05/25/2023    8:54 AM 11/22/2022    9:58 AM 11/20/2021   11:02 AM 11/18/2020   10:20 AM 11/14/2019   11:09 AM  Depression screen PHQ 2/9  Decreased Interest 0 0 0 0 0  Down, Depressed, Hopeless 0 0 0 0 0  PHQ - 2 Score 0 0 0 0 0  Altered sleeping 0   0 0  Tired, decreased energy 0   1 0  Change in appetite 3   0 0  Feeling bad or failure about yourself  0   0 0  Trouble concentrating 0   0 0  Moving slowly or fidgety/restless 0   0 0  Suicidal thoughts 0   0 0  PHQ-9  Score 3   1 0  Difficult doing work/chores Somewhat difficult    Not difficult at all       05/25/2023    8:54 AM 11/22/2022    9:58 AM 11/18/2020   10:20 AM  GAD 7 : Generalized Anxiety Score  Nervous, Anxious, on Edge 0 0 0  Control/stop worrying 0 0 0  Worry too much - different things 0 0 0  Trouble relaxing 0 0 0  Restless 0 0 0  Easily annoyed or irritable 0  1  Afraid - awful might happen 0 0 0  Total GAD 7 Score 0  1  Anxiety Difficulty Not difficult at all     Assessment & Plan:   Problem List Items Addressed This Visit     HTN (hypertension)    Chronic, good control on current regimen - continue. Appreciate cards care. He states he is taking clonidine although last filled 01/2023 -  had enough at home.       Obesity, Class I, BMI 30-34.9   CKD (chronic kidney disease) stage 3, GFR 30-59 ml/min (HCC)    Update kidney function.       MGUS (monoclonal gammopathy of unknown significance)    Update SPEP.       Relevant Orders   CBC with Differential/Platelet   Sedimentation rate   Serum protein electrophoresis with reflex   Anorexia - Primary    Ongoing issue for the past 3 years. Overall asxs. Check labs today for further evaluation.       Relevant Orders   Comprehensive metabolic panel   CBC with Differential/Platelet   Sedimentation rate   Serum protein electrophoresis with reflex   Other Visit Diagnoses     Immunization due       Relevant Orders   Flu Vaccine Trivalent High Dose (Fluad) (Completed)        No orders of the defined types were placed in this encounter.   Orders Placed This Encounter  Procedures   Flu Vaccine Trivalent High Dose (Fluad)   Comprehensive metabolic panel   CBC with Differential/Platelet   Sedimentation rate   Serum protein electrophoresis with reflex    Patient Instructions  Flu shot today  Labs today  Consider boost or ensure supplement to help stimulate appetite, especially on days you don't eat much.  If  liver ok, we may start oral antifungal terbinafine daily for 6 weeks.  We will be in touch with results.  Return in 6 months for medicare wellness visit   Follow up plan: Return in about 6 months (around 11/22/2023) for medicare wellness visit.  Eustaquio Boyden, MD

## 2023-05-27 LAB — PROTEIN ELECTROPHORESIS, SERUM, WITH REFLEX
Albumin ELP: 3.9 g/dL (ref 3.8–4.8)
Alpha 1: 0.3 g/dL (ref 0.2–0.3)
Alpha 2: 0.6 g/dL (ref 0.5–0.9)
Beta 2: 0.4 g/dL (ref 0.2–0.5)
Beta Globulin: 0.4 g/dL (ref 0.4–0.6)
Gamma Globulin: 0.9 g/dL (ref 0.8–1.7)
Total Protein: 6.5 g/dL (ref 6.1–8.1)

## 2023-06-06 ENCOUNTER — Other Ambulatory Visit: Payer: Self-pay | Admitting: Family Medicine

## 2023-06-06 DIAGNOSIS — N1832 Chronic kidney disease, stage 3b: Secondary | ICD-10-CM

## 2023-06-12 ENCOUNTER — Encounter: Payer: Self-pay | Admitting: Family Medicine

## 2023-06-12 ENCOUNTER — Other Ambulatory Visit: Payer: Self-pay | Admitting: Family Medicine

## 2023-06-12 DIAGNOSIS — B351 Tinea unguium: Secondary | ICD-10-CM | POA: Insufficient documentation

## 2023-06-12 MED ORDER — TERBINAFINE HCL 250 MG PO TABS: 125.0000 mg | ORAL_TABLET | Freq: Every day | ORAL | 0 refills | Status: DC

## 2023-06-24 DIAGNOSIS — H35352 Cystoid macular degeneration, left eye: Secondary | ICD-10-CM | POA: Diagnosis not present

## 2023-06-24 DIAGNOSIS — Z961 Presence of intraocular lens: Secondary | ICD-10-CM | POA: Diagnosis not present

## 2023-06-28 DIAGNOSIS — Z85828 Personal history of other malignant neoplasm of skin: Secondary | ICD-10-CM | POA: Diagnosis not present

## 2023-06-28 DIAGNOSIS — L821 Other seborrheic keratosis: Secondary | ICD-10-CM | POA: Diagnosis not present

## 2023-06-28 DIAGNOSIS — D2261 Melanocytic nevi of right upper limb, including shoulder: Secondary | ICD-10-CM | POA: Diagnosis not present

## 2023-06-28 DIAGNOSIS — Z08 Encounter for follow-up examination after completed treatment for malignant neoplasm: Secondary | ICD-10-CM | POA: Diagnosis not present

## 2023-06-28 DIAGNOSIS — B0229 Other postherpetic nervous system involvement: Secondary | ICD-10-CM | POA: Diagnosis not present

## 2023-06-28 DIAGNOSIS — D225 Melanocytic nevi of trunk: Secondary | ICD-10-CM | POA: Diagnosis not present

## 2023-06-28 DIAGNOSIS — L570 Actinic keratosis: Secondary | ICD-10-CM | POA: Diagnosis not present

## 2023-06-28 DIAGNOSIS — D2262 Melanocytic nevi of left upper limb, including shoulder: Secondary | ICD-10-CM | POA: Diagnosis not present

## 2023-07-18 ENCOUNTER — Telehealth: Payer: Self-pay | Admitting: Family Medicine

## 2023-07-18 ENCOUNTER — Other Ambulatory Visit: Payer: Medicare Other

## 2023-07-18 ENCOUNTER — Other Ambulatory Visit (INDEPENDENT_AMBULATORY_CARE_PROVIDER_SITE_OTHER): Payer: Medicare Other

## 2023-07-18 DIAGNOSIS — N1832 Chronic kidney disease, stage 3b: Secondary | ICD-10-CM

## 2023-07-18 LAB — RENAL FUNCTION PANEL
Albumin: 4 g/dL (ref 3.5–5.2)
BUN: 20 mg/dL (ref 6–23)
CO2: 27 meq/L (ref 19–32)
Calcium: 9.3 mg/dL (ref 8.4–10.5)
Chloride: 106 meq/L (ref 96–112)
Creatinine, Ser: 1.69 mg/dL — ABNORMAL HIGH (ref 0.40–1.50)
GFR: 36.88 mL/min — ABNORMAL LOW (ref >60.00)
Glucose, Bld: 125 mg/dL — ABNORMAL HIGH (ref 70–99)
Phosphorus: 2.7 mg/dL (ref 2.3–4.6)
Potassium: 4.3 meq/L (ref 3.5–5.1)
Sodium: 140 meq/L (ref 135–145)

## 2023-07-18 MED ORDER — DAPAGLIFLOZIN PROPANEDIOL 10 MG PO TABS: 10.0000 mg | ORAL_TABLET | Freq: Every day | ORAL | 1 refills | Status: DC

## 2023-07-18 NOTE — Telephone Encounter (Signed)
 Received BP log sheet for the past month - BP ranging 150-180s/60-70s in am, 114-154/50-70s in pm. AM readings running too high.   Ensure he's taking BP regimen currently: Amlodipine  10mg  daily, carvedilol  25mg  bid, clonidine  0.1 mg bid, lisinopril  20mg  daily, hydralazine  25mg  TID PRN.  Also on farxiga  5mg  daily.   Is he taking amlodipine  and lisinopril  in am or pm? Are his morning BP readings before or after he takes AM BP meds?   Would suggest increasing Farxiga  to 10mg  daily, watching for new symptoms of urine infection, yeast infection, groin cellulitis infection - new dose sent to pharmacy.   Regarding labs done today - kidneys remain impaired but are stable to improved with filtering rate up to 36.8%.

## 2023-07-19 NOTE — Telephone Encounter (Signed)
 Spoke with patient. He is taking meds in the morning and AM readings are before meds. Advised patient to increase farxiga to 10mg  and new rx was sent in for him. Patient verbalized understanding of everything discussed.

## 2023-08-06 ENCOUNTER — Other Ambulatory Visit: Payer: Self-pay | Admitting: Family Medicine

## 2023-08-12 NOTE — Telephone Encounter (Signed)
Please call for update on BP readings after below changes.

## 2023-08-15 NOTE — Telephone Encounter (Signed)
Mail box full not able to leave messages

## 2023-08-17 NOTE — Telephone Encounter (Signed)
 Spoke with pt asking for update on BP readings after med changes. Pt states he hasn't taken BP in about 1 wk and apologizes. I asked pt to keep a log of daily BP readings for the next wk. Then call, send MyChart message or drop off log. Pt verbalizes understanding.

## 2023-09-13 DIAGNOSIS — H35373 Puckering of macula, bilateral: Secondary | ICD-10-CM | POA: Diagnosis not present

## 2023-09-13 DIAGNOSIS — Z961 Presence of intraocular lens: Secondary | ICD-10-CM | POA: Diagnosis not present

## 2023-09-13 DIAGNOSIS — H35352 Cystoid macular degeneration, left eye: Secondary | ICD-10-CM | POA: Diagnosis not present

## 2023-10-02 ENCOUNTER — Other Ambulatory Visit: Payer: Self-pay | Admitting: Cardiovascular Disease

## 2023-10-29 ENCOUNTER — Encounter: Payer: Self-pay | Admitting: Family Medicine

## 2023-10-31 ENCOUNTER — Telehealth (INDEPENDENT_AMBULATORY_CARE_PROVIDER_SITE_OTHER): Admitting: Family Medicine

## 2023-10-31 ENCOUNTER — Encounter: Payer: Self-pay | Admitting: Family Medicine

## 2023-10-31 ENCOUNTER — Ambulatory Visit: Payer: Self-pay

## 2023-10-31 ENCOUNTER — Other Ambulatory Visit: Payer: Self-pay | Admitting: Cardiovascular Disease

## 2023-10-31 VITALS — BP 109/48 | HR 56 | Temp 98.1°F | Ht 62.5 in

## 2023-10-31 DIAGNOSIS — I10 Essential (primary) hypertension: Secondary | ICD-10-CM

## 2023-10-31 DIAGNOSIS — U071 COVID-19: Secondary | ICD-10-CM | POA: Diagnosis not present

## 2023-10-31 NOTE — Assessment & Plan Note (Signed)
 BP actually low today - has been running low in setting of COVID infection. Recommend cut lisinopril  20mg  in half for 4 days, encouraged good hydration status.

## 2023-10-31 NOTE — Telephone Encounter (Signed)
 Spoke with pt offering 4:30 phn visit, per Dr Crissie Dome. Pt agrees.   Plz add pt to Dr Ocie Belt schedule today at 4:30 for Covid+.

## 2023-10-31 NOTE — Telephone Encounter (Signed)
 Please schedule phone call visit at 4:30pm to review antiviral treatment.

## 2023-10-31 NOTE — Assessment & Plan Note (Addendum)
 Reviewed currently approved antiviral treatments.  Reviewed expected course of illness, anticipated course of recovery, as well as red flags to suggest COVID pneumonia and/or to seek urgent in-person care. Reviewed CDC isolation/quarantine guidelines.  Encouraged fluids and rest. Reviewed further supportive care measures at home including vit C 500mg  bid, vit D 2000 IU daily, zinc  100mg  daily, tylenol  PRN, pepcid 20mg  BID PRN.   Recommend:  Paxlovid contraindicated in plavix  use.  Discussed pros/cons of Molnupiravir  use.  Don't anticipate it will provide much benefit - he notes improvement in the past 24 hours. He desires to wait overnight and see how he feels tomorrow. I will have my office contact him in the morning for update. If not improved, will Rx molnupiravir . If better, will hold off on this. Tomorrow would be last day to start antiviral treatment.   Paxlovid drug interactions:  Amlodipine , atorvastatin , carvedilol , clopidogrel 

## 2023-10-31 NOTE — Progress Notes (Addendum)
 [name] - 83 y.o. male  MRN [id number]  Date of Birth: [birth year]  PCP: Claire Crick, MD  This service was provided via telemedicine. Phone Visit performed on 10/31/2023    Rationale for phone visit along with limitations reviewed. I discussed the limitations, risks, security and privacy concerns of performing a phone visit and the availability of in person appointments. I also discussed with the patient that there may be a patient responsible charge related to this service. Patient consented to telephone encounter.   I had access to video capabilities but patient did not.    Location of patient: home Location of provider: in office, [place] [facility] Name of referring provider: N/A   Names of persons and role in encounter: Provider: Claire Crick, MD  Patient: Craig Avery  Other: N/A   Time on call: 4:40pm - 4:53pm   Subjective: Chief Complaint  Patient presents with   Covid Positive    C/o fever- max 102.5, fatigue, ST and cough. Sxs started 10/27/23. Pos Covid test- 10/29/23. Pt states he does not have capability for a video visit.       HPI:  First day of symptoms: 10/27/2023 Tested COVID positive: 10/29/2023  Current symptoms: fever Tmax 102.5, fatigue, ST, productive cough, body aches.  No: dyspnea, abd pain, nausea, diarrhea, loss of taste/smell, HA.  Treatments to date: dayquil and liquid coricidin  Risk factors include: age, gender, HTN, CKD, h/o stroke  COVID vaccination status: x3    Objective/Observations:  No physical exam or vital signs collected unless specifically identified below.   BP (!) 109/48   Pulse (!) 56   Temp 98.1 F (36.7 C)   Ht 5' 2.5" (1.588 m)   BMI 34.95 kg/m   BP Readings from Last 3 Encounters:  10/31/23 (!) 109/48  05/25/23 132/78  03/01/23 (!) 188/78   Respiratory status: speaks in complete sentences without evident shortness of breath.   Assessment/Plan:  COVID-19 virus infection Reviewed  currently approved antiviral treatments.  Reviewed expected course of illness, anticipated course of recovery, as well as red flags to suggest COVID pneumonia and/or to seek urgent in-person care. Reviewed CDC isolation/quarantine guidelines.  Encouraged fluids and rest. Reviewed further supportive care measures at home including vit C 500mg  bid, vit D 2000 IU daily, zinc  100mg  daily, tylenol  PRN, pepcid 20mg  BID PRN.   Recommend:  Paxlovid contraindicated in plavix  use.  Discussed pros/cons of Molnupiravir  use.  Don't anticipate it will provide much benefit - he notes improvement in the past 24 hours. He desires to wait overnight and see how he feels tomorrow. I will have my office contact him in the morning for update. If not improved, will Rx molnupiravir . If better, will hold off on this. Tomorrow would be last day to start antiviral treatment.   Paxlovid drug interactions:  Amlodipine , atorvastatin , carvedilol , clopidogrel   HTN (hypertension) BP actually low today - has been running low in setting of COVID infection. Recommend cut lisinopril  20mg  in half for 4 days, encouraged good hydration status.    I discussed the assessment and treatment plan with the patient. The patient was provided an opportunity to ask questions and all were answered. The patient agreed with the plan and demonstrated an understanding of the instructions.  Lab Orders  No laboratory test(s) ordered today    No orders of the defined types were placed in this encounter.   The patient was advised to call back or seek an in-person evaluation if the  symptoms worsen or if the condition fails to improve as anticipated.  Claire Crick, MD

## 2023-10-31 NOTE — Telephone Encounter (Signed)
 Copied from CRM [id number]. Topic: Clinical - Red Word Triage >> Oct 31, 2023  8:45 AM Marissa P wrote: Red Word that prompted transfer to Nurse Triage:  Dad (Patient) is still running a fever (it has ranged from 100 to 102 all weekend) DayQuil/Nyquil get fever down.  Very lethargic, sore throat and cough are his main symptoms. I've been trying to push fluids. He has no appetite, some pain an congestion.  Chief Complaint: COVID + Symptoms: Fever, fatigue, congestion Frequency: A few days Pertinent Negatives: Patient denies difficulty breathing Disposition: [] ED /[] Urgent Care (no appt availability in office) / [x] Appointment(In office/virtual)/ []  [facility]/ [] Home Care/ [x] Refused Recommended Disposition /[] [facility]/ []  Follow-up with PCP Additional Notes: Spoke to patient's son-in-law, Dee Farber, on behalf of the patient. Patient tested positive for COVID on Saturday and has been experiencing symptoms for a few days. Dee Farber denied difficulty breathing in patient at this time. Dee Farber is most concerned about patient's fever. Patient had a fever of 102.0 today and stated the fever has not broken in a few days. Patient has been taking Tylenol . Advised patient to be seen within 4 hours, per protocol. Dee Farber declined appointment and stated that patient had already told him he doesn't want to come in. Offered virtual appointment. Dee Farber declined. Jeff/patient requesting medication to be called in, without an appointment. Advised that I would route conversation to office for their discretion. Please advise.   Reason for Disposition  [1] Fever > 101 F (38.3 C) AND [2] age > 60 years  Answer Assessment - Initial Assessment Questions 1. COVID-19 DIAGNOSIS: "How do you know that you have COVID?" (e.g., positive lab test or self-test, diagnosed by doctor or NP/PA, symptoms after exposure).     Tested positive at home on Saturday 2. COVID-19 EXPOSURE: "Was there any known exposure to COVID before  the symptoms began?" CDC Definition of close contact: within 6 feet (2 meters) for a total of 15 minutes or more over a 24-hour period.      Unknown 3. ONSET: "When did the COVID-19 symptoms start?"      A few days ago  4. WORST SYMPTOM: "What is your worst symptom?" (e.g., cough, fever, shortness of breath, muscle aches)     Fever that he can't break 5. COUGH: "Do you have a cough?" If Yes, ask: "How bad is the cough?"       Denies 6. FEVER: "Do you have a fever?" If Yes, ask: "What is your temperature, how was it measured, and when did it start?"     102.0 this morning, has been taking DayQuil, NyQuil and Tylenol  7. RESPIRATORY STATUS: "Describe your breathing?" (e.g., normal; shortness of breath, wheezing, unable to speak)      Denies difficulty breathing 8. BETTER-SAME-WORSE: "Are you getting better, staying the same or getting worse compared to yesterday?"  If getting worse, ask, "In what way?"     Unknown, son states symptoms don't seen to be improving  9. OTHER SYMPTOMS: "Do you have any other symptoms?"  (e.g., chills, fatigue, headache, loss of smell or taste, muscle pain, sore throat)     Congestion, fatigue 10. HIGH RISK DISEASE: "Do you have any chronic medical problems?" (e.g., asthma, heart or lung disease, weak immune system, obesity, etc.)       Denies 11. VACCINE: "Have you had the COVID-19 vaccine?" If Yes, ask: "Which one, how many shots, when did you get it?"       N/A 13. O2 SATURATION  MONITOR:  "Do you use an oxygen  saturation monitor (pulse oximeter) at home?" If Yes, ask "What is your reading (oxygen  level) today?" "What is your usual oxygen  saturation reading?" (e.g., 95%)       N/A  Protocols used: Coronavirus (COVID-19) Diagnosed or Suspected-A-AH

## 2023-10-31 NOTE — Telephone Encounter (Signed)
 Noted. See other messages by clicking blue 'View Conversation' link.

## 2023-10-31 NOTE — Telephone Encounter (Signed)
 Spoke with patient.  He is feeling some better today.   Can we call tomorrow am for update on symptoms? If continued improvement will not start antiviral. If not feeling better, discussed I would phone in molnupiravir . Let me know how he's feeling tomorrow.

## 2023-11-01 MED ORDER — CLONIDINE HCL 0.1 MG PO TABS: 0.1000 mg | ORAL_TABLET | Freq: Two times a day (BID) | ORAL | 0 refills | Status: DC

## 2023-11-01 NOTE — Telephone Encounter (Signed)
 Spoke with pt asking for update on symptoms. Pt states he feels a whole lot better this AM. Says he doesn't need the medication Dr Crissie Dome was talking about.

## 2023-11-01 NOTE — Telephone Encounter (Signed)
 Noted! Thank you

## 2023-11-17 ENCOUNTER — Encounter (HOSPITAL_COMMUNITY): Payer: Self-pay

## 2023-11-24 ENCOUNTER — Ambulatory Visit

## 2023-11-24 ENCOUNTER — Other Ambulatory Visit: Payer: Self-pay | Admitting: Cardiovascular Disease

## 2023-11-24 VITALS — BP 137/57 | Ht 62.5 in | Wt 195.0 lb

## 2023-11-24 DIAGNOSIS — Z2821 Immunization not carried out because of patient refusal: Secondary | ICD-10-CM

## 2023-11-24 DIAGNOSIS — Z0000 Encounter for general adult medical examination without abnormal findings: Secondary | ICD-10-CM | POA: Diagnosis not present

## 2023-11-24 NOTE — Progress Notes (Signed)
 Because this visit was a virtual/telehealth visit,  certain criteria was not obtained, such a blood pressure, CBG if applicable, and timed get up and go. Any medications not marked as "taking" were not mentioned during the medication reconciliation part of the visit. Any vitals not documented were not able to be obtained due to this being a telehealth visit or patient was unable to self-report a recent blood pressure reading due to a lack of equipment at home via telehealth. Vitals that have been documented are verbally provided by the patient.   This visit was performed by a medical professional under my direct supervision. I was immediately available for consultation/collaboration. I have reviewed and agree with the Annual Wellness Visit documentation.  Subjective:   Craig Avery is a 83 y.o. who presents for a Medicare Wellness preventive visit.  As a reminder, Annual Wellness Visits don't include a physical exam, and some assessments may be limited, especially if this visit is performed virtually. We may recommend an in-person visit if needed.  Visit Complete: Virtual I connected with  Craig Avery on 11/24/23 by a audio enabled telemedicine application and verified that I am speaking with the correct person using two identifiers.  Patient Location: Home  Provider Location: Home Office  I discussed the limitations of evaluation and management by telemedicine. The patient expressed understanding and agreed to proceed.  Vital Signs: Because this visit was a virtual/telehealth visit, some criteria may be missing or patient reported. Any vitals not documented were not able to be obtained and vitals that have been documented are patient reported.  VideoDeclined- This patient declined Librarian, academic. Therefore the visit was completed with audio only.  Persons Participating in Visit: Patient.  AWV Questionnaire: No: Patient Medicare AWV questionnaire was  not completed prior to this visit.  Cardiac Risk Factors include: advanced age (>56men, >66 women);male gender;hypertension;obesity (BMI >30kg/m2);dyslipidemia     Objective:     Today's Vitals   11/24/23 1359  BP: (!) 137/57  Weight: 195 lb (88.5 kg)  Height: 5' 2.5" (1.588 m)   Body mass index is 35.1 kg/m.     11/24/2023    2:06 PM 02/23/2020    8:59 PM 11/14/2019   11:08 AM 07/14/2019    5:00 PM 07/06/2019   11:20 AM 11/10/2018    9:35 AM 03/07/2017   11:00 AM  Advanced Directives  Does Patient Have a Medical Advance Directive? No No No No No No No  Would patient like information on creating a medical advance directive? No - Patient declined  No - Patient declined No - Patient declined No - Patient declined No - Patient declined     Current Medications (verified) Outpatient Encounter Medications as of 11/24/2023  Medication Sig   amLODipine  (NORVASC ) 10 MG tablet TAKE 1 TABLET BY MOUTH EVERY DAY   atorvastatin  (LIPITOR) 80 MG tablet Take 1 tablet (80 mg total) by mouth daily.   carvedilol  (COREG ) 25 MG tablet Take 1 tablet (25 mg total) by mouth 2 (two) times daily.   clopidogrel  (PLAVIX ) 75 MG tablet Take 1 tablet (75 mg total) by mouth daily.   dapagliflozin  propanediol (FARXIGA ) 10 MG TABS tablet Take 1 tablet (10 mg total) by mouth daily before breakfast.   Docusate Calcium  (STOOL SOFTENER PO) Take by mouth daily.   hydrALAZINE  (APRESOLINE ) 25 MG tablet Take 1 tablet (25 mg total) by mouth 3 (three) times daily as needed. Take for SBP >160   lisinopril  (ZESTRIL ) 20 MG  tablet Take 1 tablet (20 mg total) by mouth daily.   loratadine (CLARITIN) 10 MG tablet Take 10 mg by mouth daily.   Multiple Vitamins-Minerals (CENTRUM SILVER ADULT 50+ PO) Take 1 tablet by mouth daily.   terbinafine  (LAMISIL ) 250 MG tablet Take 0.5 tablets (125 mg total) by mouth daily.   [DISCONTINUED] cloNIDine  (CATAPRES ) 0.1 MG tablet Take 1 tablet (0.1 mg total) by mouth 2 (two) times daily. PLEASE CALL  OFFICE TO SCHEDULE APPOINTMENT PRIOR TO NEXT REFILL (2nd attempt)   No facility-administered encounter medications on file as of 11/24/2023.    Allergies (verified) Patient has no known allergies.   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   from CVA - completely resolved as of 08/2012 ophtho eval ([place])   Obesity    Pneumonia due to COVID-19 virus 06/2019   s/p hospitalization with PNA   Stroke (HCC) 05/2012   R PCA territory, presented with L paresthesias   Vasovagal syncope 11/20/2014   Past Surgical History:  Procedure Laterality Date   CAROTID ENDARTERECTOMY Left 07-21-12   cea   CATARACT EXTRACTION  2004   bilateral   COLONOSCOPY WITH PROPOFOL  N/A 06/24/2015   3 polyps, one with high grade dysplasia, rpt 3 mo; Marnee Sink, MD   COLONOSCOPY WITH PROPOFOL  N/A 10/14/2015   hyperplastic polyp; f/u PRN; Marnee Sink, MD   ENDARTERECTOMY  07/21/2012   Left CEA; Surgeon: Margherita Shell, MD   EYE SURGERY     MELANOMA EXCISION  12/30/2016   right side of head above ear   PATCH ANGIOPLASTY  07/21/2012   Procedure: PATCH ANGIOPLASTY;  Surgeon: Margherita Shell, MD;  Location: [facility] OR;  Service: Vascular;  Laterality: Left;  Using 1 cm x 6 cm Vascu Guard patch    TEE WITHOUT CARDIOVERSION  05/22/2012   Procedure: TRANSESOPHAGEAL ECHOCARDIOGRAM (TEE);  Surgeon: Lenise Quince, MD;  Location: [facility] ENDOSCOPY;  Service: Cardiovascular;  Laterality: N/A;   WISDOM TOOTH EXTRACTION     Family History  Problem Relation Age of Onset   CAD Brother 42       MI   Heart disease Brother        before age 28   Heart attack Brother    Hypertension Mother    Cirrhosis Father 43       EtOHic   Aneurysm Paternal Uncle 52    Heart disease Daughter    Hyperlipidemia Daughter    Social History   Socioeconomic History   Marital status: Widowed    Spouse name: Not on file   Number of children: Not on file   Years of education: Not on file   Highest education level: Not on file  Occupational History   Not on file  Tobacco Use   Smoking status: Former    Current packs/day: 0.00    Average packs/day: 1.5 packs/day for 33.0 years (49.5 ttl pk-yrs)    Types: Cigarettes    Start date: 06/18/1961    Quit date: 06/18/1994    Years since quitting: 29.4   Smokeless tobacco: Never  Vaping Use   Vaping status: Never Used  Substance and Sexual Activity   Alcohol use: Yes    Alcohol/week:  8.0 - 10.0 standard drinks of alcohol    Types: 8 - 10 Cans of beer per week    Comment: weekly (beer)   Drug use: No   Sexual activity: Yes  Other Topics Concern   Not on file  Social History Narrative   Caffeine: light coffee in am, working on cutting back   Lives alone. Widower (08/2017). Daughter next door. Other children nearby.   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 Would want wife and daughters to be HCPOA. Requests info today.   Social Drivers of Corporate investment banker Strain: Low Risk  (11/24/2023)   Overall Financial Resource Strain (CARDIA)    Difficulty of Paying Living Expenses: Not hard at all  Food Insecurity: No Food Insecurity (11/24/2023)   Hunger Vital Sign    Worried About Running Out of Food in the Last Year: Never true    Ran Out of Food in the Last Year: Never true  Transportation Needs: No Transportation Needs (11/24/2023)   PRAPARE - Administrator, Civil Service (Medical): No    Lack of Transportation (Non-Medical): No  Physical Activity: Insufficiently  Active (11/24/2023)   Exercise Vital Sign    Days of Exercise per Week: 2 days    Minutes of Exercise per Session: 10 min  Stress: No Stress Concern Present (11/24/2023)   [employer] of Occupational Health - Occupational Stress Questionnaire    Feeling of Stress : Not at all  Social Connections: Moderately Isolated (11/24/2023)   Social Connection and Isolation Panel [NHANES]    Frequency of Communication with Friends and Family: More than three times a week    Frequency of Social Gatherings with Friends and Family: More than three times a week    Attends Religious Services: Never    Database administrator or Organizations: No    Attends Engineer, structural: Never    Marital Status: Married    Tobacco Counseling Counseling given: Not Answered    Clinical Intake:  Pre-visit preparation completed: Yes  Pain : No/denies pain     BMI - recorded: 35.1 Nutritional Status: BMI > 30  Obese Nutritional Risks: None Diabetes: No  Lab Results  Component Value Date   HGBA1C 6.1 (H) 07/19/2019   HGBA1C 5.6 11/10/2018   HGBA1C 5.6 07/07/2016     How often do you need to have someone help you when you read instructions, pamphlets, or other written materials from your doctor or pharmacy?: 1 - Never What is the last grade level you completed in school?: 8th grade  Interpreter Needed?: No  Information entered by :: [employer]   Activities of Daily Living     11/24/2023    2:05 PM  In your present state of health, do you have any difficulty performing the following activities:  Hearing? 0  Vision? 0  Difficulty concentrating or making decisions? 0  Walking or climbing stairs? 0  Dressing or bathing? 0  Doing errands, shopping? 0  Preparing Food and eating ? N  Using the Toilet? N  In the past six months, have you accidently leaked urine? N  Do you have problems with loss of bowel control? N  Managing your Medications? N  Managing  your Finances? N   Housekeeping or managing your Housekeeping? N    Patient Care Team: Claire Crick, MD as PCP - General (Family Medicine) Jerelene Monday Deadra Everts, MD as PCP - Cardiology (Cardiology) Jonathan Neighbor, [facility] (Inactive) as Pharmacist (Pharmacist)  Indicate any recent Medical Services you may have received from other than [facility] providers in the past year (date may be approximate).     Assessment:    This is a routine wellness examination for Craig Avery.  Hearing/Vision screen Hearing Screening - Comments:: No hearing difficulties    Goals Addressed             This Visit's Progress    Patient Stated       Patient would like to live       Depression Screen     11/24/2023    2:07 PM 05/25/2023    8:54 AM 11/22/2022    9:58 AM 11/20/2021   11:02 AM 11/18/2020   10:20 AM 11/14/2019   11:09 AM 11/10/2018    9:35 AM  PHQ 2/9 Scores  PHQ - 2 Score 0 0 0 0 0 0 0  PHQ- 9 Score 0 3   1 0 0    Fall Risk     11/24/2023    2:06 PM 05/25/2023    8:54 AM 11/22/2022    9:58 AM 11/20/2021   11:02 AM 11/18/2020    9:32 AM  Fall Risk   Falls in the past year? 0 0 0 0 0  Number falls in past yr: 0 0     Injury with Fall? 0 0     Risk for fall due to : No Fall Risks No Fall Risks     Follow up Falls prevention discussed;Falls evaluation completed Falls evaluation completed       MEDICARE RISK AT HOME:  Medicare Risk at Home Any stairs in or around the home?: Yes If so, are there any without handrails?: No Home free of loose throw rugs in walkways, pet beds, electrical cords, etc?: Yes Adequate lighting in your home to reduce risk of falls?: Yes Life alert?: No Use of a cane, walker or w/c?: No Grab bars in the bathroom?: Yes Shower chair or bench in shower?: Yes Elevated toilet seat or a handicapped toilet?: Yes  TIMED UP AND GO:  Was the test performed?  No  Cognitive Function: 6CIT completed    11/14/2019   11:12 AM 11/10/2018    3:33 PM 01/04/2017    9:18 AM  MMSE - Mini  Mental State Exam  Orientation to time 5 5 5   Orientation to Place 5 5 5   Registration 3 3 3   Attention/ Calculation 5 0 0  Recall 3 3 3   Language- name 2 objects  0 0  Language- repeat 1 1 1   Language- follow 3 step command  0 3  Language- read & follow direction  0 0  Write a sentence  0 0  Copy design  0 0  Total score  17 20        11/24/2023    2:02 PM  6CIT Screen  What Year? 0 points  What month? 0 points  What time? 0 points  Count back from 20 0 points  Months in reverse 0 points  Repeat phrase 0 points  Total Score 0 points    Immunizations Immunization History  Administered Date(s) Administered   Fluad Quad(high Dose 65+) 05/17/2019, 05/21/2020, 05/22/2021, 05/24/2022   Fluad Trivalent(High Dose 65+)  05/25/2023   Influenza-Unspecified 03/12/2014   PFIZER(Purple Top)SARS-COV-2 Vaccination 10/25/2019, 11/15/2019   PNEUMOCOCCAL CONJUGATE-20 11/20/2021   Tdap 11/16/2014    Screening Tests Health Maintenance  Topic Date Due   Zoster Vaccines- Shingrix (1 of 2) Never done   COVID-19 Vaccine (3 - Pfizer risk series) 12/13/2019   INFLUENZA VACCINE  02/10/2024   DTaP/Tdap/Td (2 - Td or Tdap) 11/15/2024   Medicare Annual Wellness (AWV)  11/23/2024   Pneumonia Vaccine 23+ Years old  Completed   HPV VACCINES  Aged Out   Meningococcal B Vaccine  Aged Out    Health Maintenance  Health Maintenance Due  Topic Date Due   Zoster Vaccines- Shingrix (1 of 2) Never done   COVID-19 Vaccine (3 - Pfizer risk series) 12/13/2019   Health Maintenance Items Addressed:PATIENT DECLINED VACCINES  Additional Screening:  Vision Screening: Recommended annual ophthalmology exams for early detection of glaucoma and other disorders of the eye.  Dental Screening: Recommended annual dental exams for proper oral hygiene  Community Resource Referral / Chronic Care Management: CRR required this visit?  No   CCM required this visit?  No   Plan:    I have personally reviewed  and noted the following in the patient's chart:   Medical and social history Use of alcohol, tobacco or illicit drugs  Current medications and supplements including opioid prescriptions. Patient is not currently taking opioid prescriptions. Functional ability and status Nutritional status Physical activity Advanced directives List of other physicians Hospitalizations, surgeries, and ER visits in previous 12 months Vitals Screenings to include cognitive, depression, and falls Referrals and appointments  In addition, I have reviewed and discussed with patient certain preventive protocols, quality metrics, and best practice recommendations. A written personalized care plan for preventive services as well as general preventive health recommendations were provided to patient.   Freeda Sarvesh, [place]   11/24/2023   After Visit Summary: (MyChart) Due to this being a telephonic visit, the after visit summary with patients personalized plan was offered to patient via MyChart   Notes: Nothing significant to report at this time.

## 2023-11-24 NOTE — Patient Instructions (Signed)
 Mr. Craig Avery , Thank you for taking time out of your busy schedule to complete your Annual Wellness Visit with me. I enjoyed our conversation and look forward to speaking with you again next year. I, as well as your care team,  appreciate your ongoing commitment to your health goals. Please review the following plan we discussed and let me know if I can assist you in the future. Your Game plan/ To Do List    Referrals: If you haven't heard from the office you've been referred to, please reach out to them at the phone provided.   Follow up Visits: Next Medicare AWV with our clinical staff: 11/27/2024   Have you seen your provider in the last 6 months (3 months if uncontrolled diabetes)? Yes Next Office Visit with your provider: 12/09/2023  Clinician Recommendations:  Aim for 30 minutes of exercise or brisk walking, 6-8 glasses of water, and 5 servings of fruits and vegetables each day.       This is a list of the screening recommended for you and due dates:  Health Maintenance  Topic Date Due   Zoster (Shingles) Vaccine (1 of 2) Never done   COVID-19 Vaccine (3 - Pfizer risk series) 12/13/2019   Flu Shot  02/10/2024   DTaP/Tdap/Td vaccine (2 - Td or Tdap) 11/15/2024   Medicare Annual Wellness Visit  11/23/2024   Pneumonia Vaccine  Completed   HPV Vaccine  Aged Out   Meningitis B Vaccine  Aged Out    Advanced directives: (Copy Requested) Please bring a copy of your health care power of attorney and living will to the office to be added to your chart at your convenience. You can mail to [facility] [address] [place], [place] [address] or email to [email or web address] Advance Care Planning is important because it:  [x]  Makes sure you receive the medical care that is consistent with your values, goals, and preferences  [x]  It provides guidance to your family and loved ones and reduces their decisional burden about whether or not they are making the right  decisions based on your wishes.  Follow the link provided in your after visit summary or read over the paperwork we have mailed to you to help you started getting your Advance Directives in place. If you need assistance in completing these, please reach out to us  so that we can help you!  See attachments for Preventive Care and Fall Prevention Tips.

## 2023-11-29 ENCOUNTER — Encounter: Payer: Medicare Other | Admitting: Family Medicine

## 2023-12-09 ENCOUNTER — Encounter: Payer: Self-pay | Admitting: Family Medicine

## 2023-12-09 ENCOUNTER — Ambulatory Visit (INDEPENDENT_AMBULATORY_CARE_PROVIDER_SITE_OTHER): Admitting: Family Medicine

## 2023-12-09 VITALS — BP 132/64 | HR 59 | Temp 98.3°F | Ht 62.5 in | Wt 190.4 lb

## 2023-12-09 DIAGNOSIS — E785 Hyperlipidemia, unspecified: Secondary | ICD-10-CM | POA: Diagnosis not present

## 2023-12-09 DIAGNOSIS — R630 Anorexia: Secondary | ICD-10-CM

## 2023-12-09 DIAGNOSIS — Z7189 Other specified counseling: Secondary | ICD-10-CM | POA: Diagnosis not present

## 2023-12-09 DIAGNOSIS — E66811 Obesity, class 1: Secondary | ICD-10-CM | POA: Diagnosis not present

## 2023-12-09 DIAGNOSIS — I10 Essential (primary) hypertension: Secondary | ICD-10-CM | POA: Diagnosis not present

## 2023-12-09 DIAGNOSIS — D472 Monoclonal gammopathy: Secondary | ICD-10-CM | POA: Diagnosis not present

## 2023-12-09 DIAGNOSIS — N1832 Chronic kidney disease, stage 3b: Secondary | ICD-10-CM | POA: Diagnosis not present

## 2023-12-09 DIAGNOSIS — F1090 Alcohol use, unspecified, uncomplicated: Secondary | ICD-10-CM | POA: Diagnosis not present

## 2023-12-09 DIAGNOSIS — G459 Transient cerebral ischemic attack, unspecified: Secondary | ICD-10-CM

## 2023-12-09 DIAGNOSIS — I739 Peripheral vascular disease, unspecified: Secondary | ICD-10-CM | POA: Diagnosis not present

## 2023-12-09 DIAGNOSIS — I6523 Occlusion and stenosis of bilateral carotid arteries: Secondary | ICD-10-CM

## 2023-12-09 LAB — CBC WITH DIFFERENTIAL/PLATELET
Basophils Absolute: 0.1 10*3/uL (ref 0.0–0.1)
Basophils Relative: 0.7 % (ref 0.0–3.0)
Eosinophils Absolute: 0.2 10*3/uL (ref 0.0–0.7)
Eosinophils Relative: 2 % (ref 0.0–5.0)
HCT: 34.3 % — ABNORMAL LOW (ref 39.0–52.0)
Hemoglobin: 11.8 g/dL — ABNORMAL LOW (ref 13.0–17.0)
Lymphocytes Relative: 40.8 % (ref 12.0–46.0)
Lymphs Abs: 3.3 10*3/uL (ref 0.7–4.0)
MCHC: 34.4 g/dL (ref 30.0–36.0)
MCV: 93.5 fl (ref 78.0–100.0)
Monocytes Absolute: 0.4 10*3/uL (ref 0.1–1.0)
Monocytes Relative: 5.3 % (ref 3.0–12.0)
Neutro Abs: 4.2 10*3/uL (ref 1.4–7.7)
Neutrophils Relative %: 51.2 % (ref 43.0–77.0)
Platelets: 259 10*3/uL (ref 150.0–400.0)
RBC: 3.67 Mil/uL — ABNORMAL LOW (ref 4.22–5.81)
RDW: 13.4 % (ref 11.5–15.5)
WBC: 8.2 10*3/uL (ref 4.0–10.5)

## 2023-12-09 LAB — MICROALBUMIN / CREATININE URINE RATIO
Creatinine,U: 58.4 mg/dL
Microalb Creat Ratio: UNDETERMINED mg/g (ref 0.0–30.0)
Microalb, Ur: <0.7 mg/dL

## 2023-12-09 LAB — LIPID PANEL
Cholesterol: 131 mg/dL (ref 0–200)
HDL: 54.3 mg/dL (ref >39.00)
LDL Cholesterol: 58 mg/dL (ref 0–99)
NonHDL: 76.83
Total CHOL/HDL Ratio: 2
Triglycerides: 92 mg/dL (ref 0.0–149.0)
VLDL: 18.4 mg/dL (ref 0.0–40.0)

## 2023-12-09 LAB — COMPREHENSIVE METABOLIC PANEL WITH GFR
ALT: 15 U/L (ref 0–53)
AST: 18 U/L (ref 0–37)
Albumin: 4 g/dL (ref 3.5–5.2)
Alkaline Phosphatase: 56 U/L (ref 39–117)
BUN: 19 mg/dL (ref 6–23)
CO2: 26 meq/L (ref 19–32)
Calcium: 8.7 mg/dL (ref 8.4–10.5)
Chloride: 104 meq/L (ref 96–112)
Creatinine, Ser: 1.75 mg/dL — ABNORMAL HIGH (ref 0.40–1.50)
GFR: 35.27 mL/min — ABNORMAL LOW (ref >60.00)
Glucose, Bld: 109 mg/dL — ABNORMAL HIGH (ref 70–99)
Potassium: 4.5 meq/L (ref 3.5–5.1)
Sodium: 138 meq/L (ref 135–145)
Total Bilirubin: 0.6 mg/dL (ref 0.2–1.2)
Total Protein: 6.3 g/dL (ref 6.0–8.3)

## 2023-12-09 LAB — PHOSPHORUS: Phosphorus: 3.7 mg/dL (ref 2.3–4.6)

## 2023-12-09 LAB — VITAMIN D 25 HYDROXY (VIT D DEFICIENCY, FRACTURES): VITD: 42.35 ng/mL (ref 30.00–100.00)

## 2023-12-09 MED ORDER — DAPAGLIFLOZIN PROPANEDIOL 10 MG PO TABS: 10.0000 mg | ORAL_TABLET | Freq: Every day | ORAL | 4 refills | Status: AC

## 2023-12-09 MED ORDER — CLOPIDOGREL BISULFATE 75 MG PO TABS: 75.0000 mg | ORAL_TABLET | Freq: Every day | ORAL | 4 refills | Status: AC

## 2023-12-09 NOTE — Assessment & Plan Note (Signed)
 Chronic, update kidney function.

## 2023-12-09 NOTE — Assessment & Plan Note (Signed)
 S/p L CEA  Overdue for VVS and carotid US  - ordered and referred.

## 2023-12-09 NOTE — Progress Notes (Signed)
 Ph: [phone] Fax: [phone]   Patient ID: Craig Avery, male    DOB: [birth year], 83 y.o.   MRN: [id number]  This visit was conducted in person.  BP 132/64   Pulse (!) 59   Temp 98.3 F (36.8 C) (Oral)   Ht 5' 2.5" (1.588 m)   Wt 190 lb 6 oz (86.4 kg)   SpO2 97%   BMI 34.27 kg/m    CC: AMW f/u visit  Subjective:   HPI: Craig Avery is a 83 y.o. male presenting on 12/09/2023 for Annual Exam (MCR prt 2 [AWV- 11/24/23].)   Saw health advisor 2 wks ago for medicare wellness visit. Note reviewed.   No results found.  Flowsheet Row Office Visit from 12/09/2023 in [facility] [place]  PHQ-2 Total Score 0          12/09/2023    9:02 AM 11/24/2023    2:06 PM 05/25/2023    8:54 AM 11/22/2022    9:58 AM 11/20/2021   11:02 AM  Fall Risk   Falls in the past year? 0 0 0 0 0  Number falls in past yr:  0 0    Injury with Fall?  0 0    Risk for fall due to :  No Fall Risks No Fall Risks    Follow up  Falls prevention discussed;Falls evaluation completed Falls evaluation completed    Daughter Dorla Gartner helps fills pill box.   Trouble with loss of appetite for 3-4 yrs since initial COVID infection - with ongoing weight loss noted.   Shingles 02/2023 - Rx valtrex and gabapentin  with benefit.   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 Decided against molnupiravir  use - symptoms improved on their own. Notes persistent anorexia. Occ uses boost supplement.   Sees [facility] ([facility]) s/p L CEA for asymptomatic stenosis 2014. 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     Preventative: COLONOSCOPY WITH PROPOFOL  06/24/2015 3 polyps, one with high grade dysplasia, rpt 3 mo; Marnee Sink, MD COLONOSCOPY WITH PROPOFOL  10/14/2015 hyperplastic polyp Marnee Sink, MD) - aged out. Denies blood in stool or bowel changes Prostate cancer screening - aged out  Lung cancer  screening - not eligible Needle phobia  Flu shot - declines  COVID vaccine - 10/2019, 11/2019, booster x1 (unsure date)  Prevnar-20 11/2021 Tdap 11/2014  Shingrix - declines  Advanced directives - unsure if he has living will. Does not want prolonged life support if terminal condition - doesn't want to be "burden". Would want daughters to be HCPOA. Packet provided previously.  Seat belt use discussed.  Sunscreen use discussed. Sees derm yearly Dr Tresa Frohlich for h/o BCC, SCC, melanoma.  Ex smoker - quit 1995  Alcohol - 2-3 beers/day at local bars, no more than 1 drink per bar, habitual. Doesn't drink at home.  Dentist - has full dentures but doesn't use them, has not seen dentist  Eye exam yearly  Bowels - no diarrhea/constipation  Bladder - no incontinence    Caffeine: light coffee in am, working on cutting back Lives alone. Widower (08/2017). Daughter next door. Other children nearby. 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 Interim medical  history since our last visit reviewed. Allergies and medications reviewed and updated. Outpatient Medications Prior to Visit  Medication Sig Dispense Refill   amLODipine  (NORVASC ) 10 MG tablet TAKE 1 TABLET BY MOUTH EVERY DAY 90 tablet 3   atorvastatin  (LIPITOR) 80 MG tablet Take 1 tablet (80 mg total) by mouth daily. 90 tablet 2   carvedilol  (COREG ) 25 MG tablet Take 1 tablet (25 mg total) by mouth 2 (two) times daily. 180 tablet 3   cloNIDine  (CATAPRES ) 0.1 MG tablet Take 1 tablet (0.1 mg total) by mouth 2 (two) times daily. 180 tablet 0   Docusate Calcium  (STOOL SOFTENER PO) Take by mouth daily.     hydrALAZINE  (APRESOLINE ) 25 MG tablet Take 1 tablet (25 mg total) by mouth 3 (three) times daily as needed. Take for SBP >160  180 tablet 3   lisinopril  (ZESTRIL ) 20 MG tablet Take 1 tablet (20 mg total) by mouth daily. 90 tablet 2   loratadine (CLARITIN) 10 MG tablet Take 10 mg by mouth daily.     Multiple Vitamins-Minerals (CENTRUM SILVER ADULT 50+ PO) Take 1 tablet by mouth daily.     clopidogrel  (PLAVIX ) 75 MG tablet Take 1 tablet (75 mg total) by mouth daily. 90 tablet 4   dapagliflozin  propanediol (FARXIGA ) 10 MG TABS tablet Take 1 tablet (10 mg total) by mouth daily before breakfast. 90 tablet 1   terbinafine  (LAMISIL ) 250 MG tablet Take 0.5 tablets (125 mg total) by mouth daily. 23 tablet 0   No facility-administered medications prior to visit.     Per HPI unless specifically indicated in ROS section below Review of Systems  Constitutional:  Positive for appetite change (chronic anorexia). Negative for activity change, chills, fatigue, fever and unexpected weight change.  HENT:  Negative for hearing loss.   Eyes:  Negative for visual disturbance.  Respiratory:  Negative for cough, chest tightness, shortness of breath and wheezing.   Cardiovascular:  Negative for chest pain, palpitations and leg swelling.  Gastrointestinal:  Negative for abdominal distention, abdominal pain, blood in stool, constipation, diarrhea, nausea and vomiting.  Genitourinary:  Negative for difficulty urinating and hematuria.  Musculoskeletal:  Negative for arthralgias, myalgias and neck pain.  Skin:  Negative for rash.  Neurological:  Negative for dizziness, seizures, syncope and headaches.  Hematological:  Negative for adenopathy. Bruises/bleeds easily.  Psychiatric/Behavioral:  Negative for dysphoric mood. The patient is not nervous/anxious.     Objective:  BP 132/64   Pulse (!) 59   Temp 98.3 F (36.8 C) (Oral)   Ht 5' 2.5" (1.588 m)   Wt 190 lb 6 oz (86.4 kg)   SpO2 97%   BMI 34.27 kg/m   Wt Readings from Last 3 Encounters:  12/09/23 190 lb 6 oz (86.4 kg)  11/24/23 195 lb (88.5 kg)  05/25/23 194 lb 3.2 oz (88.1 kg)       Physical Exam Vitals and nursing note reviewed.  Constitutional:      General: He is not in acute distress.    Appearance: Normal appearance. He is well-developed. He is not ill-appearing.  HENT:     Head: Normocephalic and atraumatic.     Right Ear: Hearing, tympanic membrane, ear canal and external ear normal.     Left Ear: Hearing, tympanic membrane, ear canal and external ear normal.     Mouth/Throat:     Mouth: Mucous membranes are moist.     Pharynx: Oropharynx is clear. No oropharyngeal exudate or posterior oropharyngeal erythema.  Eyes:  General: No scleral icterus.    Extraocular Movements: Extraocular movements intact.     Conjunctiva/sclera: Conjunctivae normal.     Pupils: Pupils are equal, round, and reactive to light.  Neck:     Thyroid : No thyroid  mass or thyromegaly.     Vascular: No carotid bruit.  Cardiovascular:     Rate and Rhythm: Normal rate and regular rhythm.     Pulses: Normal pulses.          Radial pulses are 2+ on the right side and 2+ on the left side.     Heart sounds: Normal heart sounds. No murmur heard. Pulmonary:     Effort: Pulmonary effort is normal. No respiratory distress.     Breath sounds: Normal breath sounds. No wheezing, rhonchi or rales.  Abdominal:     General: Bowel sounds are normal. There is no distension.     Palpations: Abdomen is soft. There is no mass.     Tenderness: There is no abdominal tenderness. There is no guarding or rebound.     Hernia: No hernia is present.  Musculoskeletal:        General: Normal range of motion.     Cervical back: Normal range of motion and neck supple.     Right lower leg: No edema.     Left lower leg: No edema.  Lymphadenopathy:     Cervical: No cervical adenopathy.  Skin:    General: Skin is warm and dry.     Findings: No rash.  Neurological:     General: No focal deficit present.     Mental Status: He is alert and oriented to person, place, and time.  Psychiatric:        Mood  and Affect: Mood normal.        Behavior: Behavior normal.        Thought Content: Thought content normal.        Judgment: Judgment normal.       Results for orders placed or performed in visit on 07/18/23  Renal function panel   Collection Time: 07/18/23 10:34 AM  Result Value Ref Range   Sodium 140 135 - 145 mEq/L   Potassium 4.3 3.5 - 5.1 mEq/L   Chloride 106 96 - 112 mEq/L   CO2 27 19 - 32 mEq/L   Albumin 4.0 3.5 - 5.2 g/dL   BUN 20 6 - 23 mg/dL   Creatinine, Ser 0.98 (H) 0.40 - 1.50 mg/dL   Glucose, Bld 119 (H) 70 - 99 mg/dL   Phosphorus 2.7 2.3 - 4.6 mg/dL   GFR 14.78 (L) >29.56 mL/min   Calcium  9.3 8.4 - 10.5 mg/dL   Lab Results  Component Value Date   WBC 7.7 05/25/2023   HGB 12.6 (L) 05/25/2023   HCT 37.6 (L) 05/25/2023   MCV 97.1 05/25/2023   PLT 278.0 05/25/2023   Assessment & Plan:   Problem List Items Addressed This Visit     Advanced care planning/counseling discussion - Primary (Chronic)   Previously discussed. Encourage he complete advanced directive and bring us  a copy. Daughter is HCPOA.       HTN (hypertension)   Chronic, stable on current regimen including farxiga , amlodipine , carvedilol , clonidine , lisinopril  and hydralazine       Carotid stenosis, bilateral   S/p L CEA  Overdue for [facility] and carotid US  - ordered and referred.       Relevant Orders   Ambulatory referral to Vascular Surgery   VAS US  CAROTID  TIA (transient ischemic attack)   Continue aspirin , plavix , statin.       Relevant Medications   clopidogrel  (PLAVIX ) 75 MG tablet   Hyperlipidemia LDL goal <70   Chronic. Update FLP on atorvastatin  80mg  daily. The ASCVD Risk score (Arnett DK, et al., 2019) failed to calculate for the following reasons:   The 2019 ASCVD risk score is only valid for ages 68 to 28   Risk score cannot be calculated because patient has a medical history suggesting prior/existing ASCVD       Relevant Orders   Lipid panel   Comprehensive metabolic  panel with GFR   Obesity, Class I, BMI 30-34.9   Reviewed weight loss related to anorexia post COVID      CKD (chronic kidney disease) stage 3, GFR 30-59 ml/min (HCC)   Chronic, update kidney function.       Relevant Medications   dapagliflozin  propanediol (FARXIGA ) 10 MG TABS tablet   Other Relevant Orders   Comprehensive metabolic panel with GFR   Phosphorus   VITAMIN D  25 Hydroxy (Vit-D Deficiency, Fractures)   Microalbumin / creatinine urine ratio   Parathyroid  hormone, intact (no Ca)   CBC with Differential/Platelet   PVD (peripheral vascular disease) with claudication (HCC)   Referred back to [facility]VS.       Relevant Orders   Ambulatory referral to Vascular Surgery   MGUS (monoclonal gammopathy of unknown significance)   Latest SPEP normal 05/2023. Will continue to monitor.       Habitual alcohol use   Reviewed alcohol use - about 2-3 beers/day      Anorexia   Chronic, started after initial COVID infection.  Discussed ensure supplement use.         Meds ordered this encounter  Medications   clopidogrel  (PLAVIX ) 75 MG tablet    Sig: Take 1 tablet (75 mg total) by mouth daily.    Dispense:  90 tablet    Refill:  4   dapagliflozin  propanediol (FARXIGA ) 10 MG TABS tablet    Sig: Take 1 tablet (10 mg total) by mouth daily before breakfast.    Dispense:  90 tablet    Refill:  4    Orders Placed This Encounter  Procedures   Lipid panel   Comprehensive metabolic panel with GFR   Phosphorus   VITAMIN D  25 Hydroxy (Vit-D Deficiency, Fractures)   Microalbumin / creatinine urine ratio   Parathyroid  hormone, intact (no Ca)   CBC with Differential/Platelet   Ambulatory referral to Vascular Surgery    Referral Priority:   Routine    Referral Type:   Surgical    Referral Reason:   Specialty Services Required    Requested Specialty:   Vascular Surgery    Number of Visits Requested:   1    Patient Instructions  Labs today  Bring us  a copy of your advanced  directive  I will place new referral to vascular surgeons - you last saw then 11/2021. I will also order updated carotid ultrasound.  Good to see you today  Return as needed or in 6 months for follow up visit   Follow up plan: Return in about 6 months (around 06/10/2024) for follow up visit.  Claire Crick, MD

## 2023-12-09 NOTE — Assessment & Plan Note (Signed)
 Reviewed alcohol use - about 2-3 beers/day

## 2023-12-09 NOTE — Assessment & Plan Note (Addendum)
 Chronic, started after initial COVID infection.  Discussed ensure supplement use.

## 2023-12-09 NOTE — Assessment & Plan Note (Signed)
 Previously discussed. Encourage he complete advanced directive and bring us  a copy. Daughter is HCPOA.

## 2023-12-09 NOTE — Assessment & Plan Note (Signed)
 Latest SPEP normal 05/2023. Will continue to monitor.

## 2023-12-09 NOTE — Assessment & Plan Note (Signed)
 Chronic. Update FLP on atorvastatin  80mg  daily. The ASCVD Risk score (Arnett DK, et al., 2019) failed to calculate for the following reasons:   The 2019 ASCVD risk score is only valid for ages 35 to 26   Risk score cannot be calculated because patient has a medical history suggesting prior/existing ASCVD

## 2023-12-09 NOTE — Assessment & Plan Note (Signed)
 Referred back to [facility].

## 2023-12-09 NOTE — Assessment & Plan Note (Signed)
 Continue aspirin ,plavix, statin.

## 2023-12-09 NOTE — Patient Instructions (Addendum)
 Labs today  Bring us  a copy of your advanced directive  I will place new referral to vascular surgeons - you last saw then 11/2021. I will also order updated carotid ultrasound.  Good to see you today  Return as needed or in 6 months for follow up visit

## 2023-12-09 NOTE — Assessment & Plan Note (Signed)
 Chronic, stable on current regimen including farxiga , amlodipine , carvedilol , clonidine , lisinopril  and hydralazine 

## 2023-12-09 NOTE — Assessment & Plan Note (Signed)
 Reviewed weight loss related to anorexia post COVID

## 2023-12-10 LAB — PARATHYROID HORMONE, INTACT (NO CA): PTH: 36 pg/mL (ref 16–77)

## 2023-12-14 ENCOUNTER — Ambulatory Visit: Payer: Self-pay | Admitting: Family Medicine

## 2023-12-14 DIAGNOSIS — I6523 Occlusion and stenosis of bilateral carotid arteries: Secondary | ICD-10-CM

## 2023-12-16 ENCOUNTER — Other Ambulatory Visit: Payer: Self-pay | Admitting: *Deleted

## 2023-12-16 DIAGNOSIS — I739 Peripheral vascular disease, unspecified: Secondary | ICD-10-CM

## 2023-12-16 DIAGNOSIS — I6523 Occlusion and stenosis of bilateral carotid arteries: Secondary | ICD-10-CM

## 2023-12-29 ENCOUNTER — Other Ambulatory Visit: Payer: Self-pay | Admitting: Cardiovascular Disease

## 2023-12-30 ENCOUNTER — Ambulatory Visit (HOSPITAL_COMMUNITY)
Admission: RE | Admit: 2023-12-30 | Discharge: 2023-12-30 | Disposition: A | Discharge status: Home / Self Care | Source: Ambulatory Visit | Attending: Family Medicine | Admitting: Family Medicine

## 2023-12-30 ENCOUNTER — Telehealth: Payer: Self-pay | Admitting: Family Medicine

## 2023-12-30 DIAGNOSIS — I739 Peripheral vascular disease, unspecified: Secondary | ICD-10-CM | POA: Diagnosis not present

## 2023-12-30 DIAGNOSIS — I6523 Occlusion and stenosis of bilateral carotid arteries: Secondary | ICD-10-CM

## 2023-12-30 DIAGNOSIS — Z7189 Other specified counseling: Secondary | ICD-10-CM

## 2023-12-30 LAB — VAS US ABI WITH/WO TBI
Left ABI: 1.02
Right ABI: 0.83

## 2023-12-30 NOTE — Telephone Encounter (Signed)
Placed in PCP box for review

## 2023-12-30 NOTE — Telephone Encounter (Signed)
 Patient dropped of POA for provider

## 2023-12-30 NOTE — Assessment & Plan Note (Signed)
 Advanced directives - reviewed, sent for scanning 12/2023. Daughter Crissie Dome followed by daughter Delmar Ferrara then SIL Adrianna Albee wound be HCPOA. Grants discretion to [employer] regarding medical decisions for life prolonging measures if terminal incurable condition, prolonged unconscious, or advanced dementia - doesn't want to be burden.

## 2023-12-30 NOTE — Telephone Encounter (Signed)
 Advanced directives reviewed, sent for scanning

## 2024-01-16 ENCOUNTER — Ambulatory Visit: Attending: Surgery | Admitting: Surgery

## 2024-01-16 ENCOUNTER — Encounter: Payer: Self-pay | Admitting: Surgery

## 2024-01-16 VITALS — BP 131/61 | HR 60 | Temp 98.4°F | Ht 62.5 in | Wt 185.0 lb

## 2024-01-16 DIAGNOSIS — I6523 Occlusion and stenosis of bilateral carotid arteries: Secondary | ICD-10-CM | POA: Diagnosis not present

## 2024-01-16 DIAGNOSIS — I70213 Atherosclerosis of native arteries of extremities with intermittent claudication, bilateral legs: Secondary | ICD-10-CM | POA: Diagnosis not present

## 2024-01-16 NOTE — Progress Notes (Signed)
 [facility]  Patient name: Craig Avery MRN: [id number] DOB: 09-14-1940 Sex: male   REASON FOR VISIT:    Follow up  HISOTRY OF PRESENT ILLNESS:    Craig Avery is a 83 y.o. male who is status post left carotid endarterectomy for asymptomatic stenosis on 07/21/2012.  Intraoperative findings included a 90% stenosis.  He is back today for follow-up.  He denies any new symptoms.  He is without numbness or weakness in either extremity, slurred speech, or amaurosis fugax.  He will on rare occasion to get calf cramping with ambulation which resolves with rest.  He does not have any open wounds or rest  Patient has a history of a right brain stroke.  This was felt to be secondary to hypotension and other factors as he did not have significant right carotid stenosis.  He has not had any new symptoms.  He continues to be a non-smoker.  He takes a statin for hypercholesterolemia.  He is medically managed for hypertension.  He denies symptoms of claudication.    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   from CVA - completely resolved as of 08/2012 ophtho eval ([place])   Obesity    Pneumonia due to COVID-19 virus 06/2019   s/p hospitalization with PNA   Stroke (HCC) 05/2012   R PCA territory, presented with L paresthesias   Vasovagal syncope 11/20/2014     FAMILY HISTORY:   Family History  Problem Relation Age of Onset   CAD Brother 41       MI   Heart disease Brother        before age 87   Heart attack Brother    Hypertension Mother    Cirrhosis Father 48       EtOHic   Aneurysm Paternal Uncle 50   Heart  disease Daughter    Hyperlipidemia Daughter     SOCIAL HISTORY:   Social History   Tobacco Use   Smoking status: Former    Current packs/day: 0.00    Average packs/day: 1.5 packs/day for 33.0 years (49.5 ttl pk-yrs)    Types: Cigarettes    Start date: 06/18/1961    Quit date: 06/18/1994    Years since quitting: 29.6   Smokeless tobacco: Never  Substance Use Topics   Alcohol use: Yes    Alcohol/week: 8.0 - 10.0 standard drinks of alcohol    Types: 8 - 10 Cans of beer per week    Comment: weekly (beer)     ALLERGIES:   No Known Allergies   CURRENT MEDICATIONS:   Current Outpatient Medications  Medication Sig Dispense Refill   amLODipine  (NORVASC ) 10 MG tablet TAKE 1 TABLET BY MOUTH EVERY DAY 90 tablet 3   atorvastatin  (LIPITOR) 80 MG tablet Take 1 tablet (80 mg total) by mouth daily. 90 tablet 2   carvedilol  (COREG ) 25 MG tablet Take 1 tablet (25 mg total) by mouth 2 (two) times daily. 180 tablet 3   cloNIDine  (CATAPRES ) 0.1 MG tablet Take 1 tablet (0.1 mg total) by mouth 2 (two) times daily. 180 tablet 0   clopidogrel  (PLAVIX ) 75 MG  tablet Take 1 tablet (75 mg total) by mouth daily. 90 tablet 4   dapagliflozin  propanediol (FARXIGA ) 10 MG TABS tablet Take 1 tablet (10 mg total) by mouth daily before breakfast. 90 tablet 4   Docusate Calcium  (STOOL SOFTENER PO) Take by mouth daily.     hydrALAZINE  (APRESOLINE ) 25 MG tablet Take 1 tablet (25 mg total) by mouth 3 (three) times daily as needed. Take for SBP >160 180 tablet 3   lisinopril  (ZESTRIL ) 20 MG tablet TAKE 1 TABLET BY MOUTH EVERY DAY 90 tablet 0   loratadine (CLARITIN) 10 MG tablet Take 10 mg by mouth daily.     Multiple Vitamins-Minerals (CENTRUM SILVER ADULT 50+ PO) Take 1 tablet by mouth daily.     No current facility-administered medications for this visit.    REVIEW OF SYSTEMS:   [X]  denotes positive finding, [ ]  denotes negative finding Cardiac  Comments:  Chest pain or chest pressure:    Shortness of  breath upon exertion:    Short of breath when lying flat:    Irregular heart rhythm:        Vascular    Pain in calf, thigh, or hip brought on by ambulation:    Pain in feet at night that wakes you up from your sleep:     Blood clot in your veins:    Leg swelling:         Pulmonary    Oxygen  at home:    Productive cough:     Wheezing:         Neurologic    Sudden weakness in arms or legs:     Sudden numbness in arms or legs:     Sudden onset of difficulty speaking or slurred speech:    Temporary loss of vision in one eye:     Problems with dizziness:         Gastrointestinal    Blood in stool:     Vomited blood:         Genitourinary    Burning when urinating:     Blood in urine:        Psychiatric    Major depression:         Hematologic    Bleeding problems:    Problems with blood clotting too easily:        Skin    Rashes or ulcers:        Constitutional    Fever or chills:      PHYSICAL EXAM:   Vitals:   01/16/24 1343 01/16/24 1345  BP: 126/66 131/61  Pulse: 60   Temp: 98.4 F (36.9 C)   SpO2: 96%   Weight: 185 lb (83.9 kg)   Height: 5' 2.5 (1.588 m)     GENERAL: The patient is a well-nourished male, in no acute distress. The vital signs are documented above. CARDIAC: There is a regular rate and rhythm.  VASCULAR: Pedal pulses are not palpable PULMONARY: Non-labored respirations  MUSCULOSKELETAL: There are no major deformities or cyanosis. NEUROLOGIC: No focal weakness or paresthesias are detected. SKIN: There are no ulcers or rashes noted. PSYCHIATRIC: The patient has a normal affect.  STUDIES:   I have reviewed the following: Right Carotid: Velocities in the right ICA are consistent with a 1-39%  stenosis.   Left Carotid: Velocities in the left ICA are consistent with a 1-39%  stenosis               (near normal) with minimal  disease.   Vertebrals:  Bilateral vertebral arteries demonstrate antegrade flow.  Subclavians: Normal flow  hemodynamics were seen in bilateral subclavian               arteries.     ABI/TBIToday's ABIToday's TBIPrevious ABIPrevious TBI  +-------+-----------+-----------+------------+------------+  Right 0.83       0.54       0.99        0.55          +-------+-----------+-----------+------------+------------+  Left  1.02       0.57       1.07        0.46          +-------+-----------+-----------+------------+------------+    MEDICAL ISSUES:   Carotid: The patient remains asymptomatic.  No significant stenosis identified on ultrasound.  He will follow-up with surveillance imaging in 1 year  PAD: He has a slight drop in his ABI on the right.  He does not have lifestyle limiting symptoms.  He knows to contact me should he develop a nonhealing wound.    Malvina Serene CLORE, MD, FACS [facility] [phone] Pager [phone]

## 2024-01-27 ENCOUNTER — Other Ambulatory Visit: Payer: Self-pay | Admitting: Cardiovascular Disease

## 2024-02-22 ENCOUNTER — Other Ambulatory Visit: Payer: Self-pay | Admitting: Cardiovascular Disease

## 2024-02-22 NOTE — Telephone Encounter (Signed)
 Please contact pt for future appointment. Pt due for follow up.

## 2024-02-23 NOTE — Telephone Encounter (Signed)
 LVM to schedule f/u appt

## 2024-02-27 DIAGNOSIS — D2271 Melanocytic nevi of right lower limb, including hip: Secondary | ICD-10-CM | POA: Diagnosis not present

## 2024-02-27 DIAGNOSIS — D2272 Melanocytic nevi of left lower limb, including hip: Secondary | ICD-10-CM | POA: Diagnosis not present

## 2024-02-27 DIAGNOSIS — D485 Neoplasm of uncertain behavior of skin: Secondary | ICD-10-CM | POA: Diagnosis not present

## 2024-02-27 DIAGNOSIS — D2261 Melanocytic nevi of right upper limb, including shoulder: Secondary | ICD-10-CM | POA: Diagnosis not present

## 2024-02-27 DIAGNOSIS — D2262 Melanocytic nevi of left upper limb, including shoulder: Secondary | ICD-10-CM | POA: Diagnosis not present

## 2024-02-27 DIAGNOSIS — D0439 Carcinoma in situ of skin of other parts of face: Secondary | ICD-10-CM | POA: Diagnosis not present

## 2024-02-27 DIAGNOSIS — Z85828 Personal history of other malignant neoplasm of skin: Secondary | ICD-10-CM | POA: Diagnosis not present

## 2024-02-27 DIAGNOSIS — D0471 Carcinoma in situ of skin of right lower limb, including hip: Secondary | ICD-10-CM | POA: Diagnosis not present

## 2024-02-27 NOTE — Telephone Encounter (Signed)
 Pt is scheduled on 05/01/24

## 2024-03-15 DIAGNOSIS — H35352 Cystoid macular degeneration, left eye: Secondary | ICD-10-CM | POA: Diagnosis not present

## 2024-03-15 DIAGNOSIS — H35373 Puckering of macula, bilateral: Secondary | ICD-10-CM | POA: Diagnosis not present

## 2024-03-29 ENCOUNTER — Other Ambulatory Visit: Payer: Self-pay | Admitting: Cardiovascular Disease

## 2024-03-31 ENCOUNTER — Other Ambulatory Visit: Payer: Self-pay | Admitting: Cardiovascular Disease

## 2024-04-15 ENCOUNTER — Other Ambulatory Visit: Payer: Self-pay | Admitting: Cardiovascular Disease

## 2024-04-30 DIAGNOSIS — D0471 Carcinoma in situ of skin of right lower limb, including hip: Secondary | ICD-10-CM | POA: Diagnosis not present

## 2024-04-30 NOTE — Progress Notes (Unsigned)
 Patient ID: Craig Avery, male   DOB: September 13, 1940, 83 y.o.   MRN: [id number] Cardiology Office Note  Date:  05/01/2024   ID:  Craig Avery, DOB 1940-12-30, MRN [id number]  PCP:  Rilla Baller, MD   Chief Complaint  Patient presents with   12 month follow up     Patient c/o shortness of breath with over exertion.     HPI:  83 year-old gentleman with history of hypertension peripheral vascular disease, Carotid endarterectomy on the left July 2014, smoking history , 20 to 30 yr hospitalization x2 at [facility], L sided paresthesias, Found to have stroke (acute right PCA distribution ischemic infarction involving the right occipital region and right thalamus along with severe R PCA), returned a few days later to ER hypertensive to >200 systolic,  With medication management for blood pressure Chronic renal failure creatinine 1.5 He presents for follow-up of his PAD, hypertension  Last seen in clinic by myself 8/24  In follow-up today reports feeling well Very sedentary at baseline, no hobbies Daughter lives nearby but he lives alone  Reports medication compliance Recent weight loss,  just not hungry Does his own shopping  Does not walk very far, gets fatigue,  watches TV most of the day, sits in the chair most of the day  Medication list includes carvedilol  25 twice daily amlodipine  10 daily clonidine  0.1 twice daily lisinopril  20 daily  Previously reported rare fluttering, few minutes once a month  Event monitor in 2023, no atrial fibrillation noted  Lab work reviewed Total chol 132, LDL 58 CR 1.75  Family previously doing his medication pillbox In the past at times will miss the morning doses, tries to remember the evening doses Took all of his pills this morning, blood pressure running lower  No chest pain on exertion He does have chronic shortness of breath on exertion  EKG personally reviewed by myself on todays visit EKG Interpretation Date/Time:  Tuesday  May 01 2024 08:51:40 EDT Ventricular Rate:  59 PR Interval:  174 QRS Duration:  90 QT Interval:  430 QTC Calculation: 425 R Axis:   6  Text Interpretation: Sinus bradycardia consider Inferior infarct (cited on or before 06-Jul-2019) Cannot rule out Anterior infarct (cited on or before 06-Jul-2019) When compared with ECG of 01-Mar-2023 16:27, No significant change was found Confirmed by Perla Lye ([id number]) on 05/01/2024 8:55:48 AM   5/23: carotid u/s Right carotid: Velocities in the right ICA are consistent with a 1-39%  stenosis. The ECA appears >50% stenosed.  Left Carotid: Patent carotid endarterectomy with velocity in the left ICA   consistent with a 1-39% stenosis.  Other past medical history reviewed Chronic back pain  LE arterial Right: 50-74% stenosis noted in the common femoral artery. 30-49% stenosis noted in the superficial femoral artery. Left: 30-49% stenosis noted in the popliteal artery.  Initial MRI/MRA: 1. Small acute infarcts in the right hippocampus and right occipital lobe, implicating right PCA territory ischemia. No mass effect or hemorrhage.   2. Chronic small vessel ischemia in the left basal ganglia and possibly also the medial left thalamus.   1. High-grade stenosis or focal near occlusion of the right PCA distal P2 segment with attenuated more distal right PCA flow.   2. Moderate to severe focal stenosis of the left PCA P2 segment.   3. 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   4. Mild to moderate anterior circulation atherosclerosis, most pronounced in the MCA M1 segments.   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  PSH:    Past Surgical History:  Procedure Laterality Date   CAROTID ENDARTERECTOMY Left 07-21-12   cea   CATARACT EXTRACTION  2004   bilateral   COLONOSCOPY WITH PROPOFOL  N/A 06/24/2015   3 polyps, one with high grade dysplasia, rpt 3 mo; Rogelia Copping, MD   COLONOSCOPY WITH PROPOFOL  N/A 10/14/2015   hyperplastic polyp; f/u PRN; Rogelia Copping, MD   ENDARTERECTOMY  07/21/2012   Left CEA; Surgeon: Gaile LELON New, MD   EYE SURGERY     MELANOMA EXCISION  12/30/2016   right side of head above ear   PATCH ANGIOPLASTY  07/21/2012   Procedure: PATCH ANGIOPLASTY;  Surgeon: Gaile LELON New, MD;  Location: [facility] OR;  Service: Vascular;  Laterality: Left;  Using 1 cm x 6 cm Vascu Guard patch    TEE WITHOUT CARDIOVERSION  05/22/2012   Procedure: TRANSESOPHAGEAL ECHOCARDIOGRAM (TEE);  Surgeon: Redell GORMAN Shallow, MD;  Location: [facility] END[facility]COPY;  Service: Cardiovascular;  Laterality: N/A;   WISDOM TOOTH EXTRACTION      Current Outpatient Medications  Medication Sig Dispense Refill   amLODipine  (NORVASC ) 10 MG tablet TAKE 1 TABLET BY MOUTH EVERY DAY 90 tablet 3   atorvastatin  (LIPITOR) 80 MG tablet TAKE 1 TABLET BY MOUTH EVERY DAY 90 tablet 0   carvedilol  (COREG ) 25 MG tablet TAKE 1 TABLET BY MOUTH TWICE A DAY 60 tablet 0   cloNIDine  (CATAPRES ) 0.1 MG tablet TAKE 1 TABLET BY MOUTH 2 TIMES DAILY. 180 tablet 0   clopidogrel  (PLAVIX ) 75 MG tablet Take 1 tablet (75 mg total) by mouth daily. 90 tablet 4   dapagliflozin  propanediol (FARXIGA ) 10 MG TABS tablet Take 1 tablet (10 mg total) by mouth daily before breakfast. 90 tablet 4   Docusate Calcium  (STOOL SOFTENER PO) Take by mouth daily.     hydrALAZINE  (APRESOLINE ) 25 MG tablet Take 1 tablet (25 mg total) by mouth 3 (three) times daily as needed. Take for SBP >160 180 tablet 3   lisinopril  (ZESTRIL ) 20 MG tablet TAKE 1 TABLET BY MOUTH EVERY DAY 30 tablet 0   loratadine (CLARITIN) 10 MG tablet Take 10 mg by mouth daily.     Multiple Vitamins-Minerals  (CENTRUM SILVER ADULT 50+ PO) Take 1 tablet by mouth daily.     No current facility-administered medications for this visit.    Allergies:   Patient has no known allergies.   Social History:  The patient  reports that he quit smoking about 29 years ago. His smoking use included cigarettes. He started smoking about 62 years ago. He has a 49.5 pack-year smoking history. He has never used smokeless tobacco. He reports current alcohol use of about 8.0 - 10.0 standard drinks of alcohol per week. He reports that he does not use drugs.   Family History:   family history includes Aneurysm (age of onset: 34) in his paternal uncle; CAD (age of onset: 68) in his brother; Cirrhosis (age of onset: 72) in his father; Heart attack in his brother; Heart disease in his brother and daughter; Hyperlipidemia in his daughter; Hypertension in his mother.    Review of Systems: Review of Systems  Constitutional: Negative.   Respiratory: Negative.    Cardiovascular: Negative.   Gastrointestinal: Negative.  Musculoskeletal: Negative.   Neurological: Negative.   Psychiatric/Behavioral: Negative.    All other systems reviewed and are negative.   PHYSICAL EXAM: VS:  BP (!) 100/54 (BP Location: Left Arm, Patient Position: Sitting, Cuff Size: Normal)   Pulse (!) 59   Ht 5' 3 (1.6 m)   Wt 189 lb (85.7 kg)   BMI 33.48 kg/m  , BMI Body mass index is 33.48 kg/m. Constitutional:  oriented to person, place, and time. No distress.  HENT:  Head: Normocephalic and atraumatic.  Eyes:  no discharge. No scleral icterus.  Neck: Normal range of motion. Neck supple. No JVD present.  Cardiovascular: Normal rate, regular rhythm, normal heart sounds and intact distal pulses. Exam reveals no gallop and no friction rub. No edema No murmur heard. Pulmonary/Chest: Effort normal and breath sounds normal. No stridor. No respiratory distress.  no wheezes.  no rales.  no tenderness.  Abdominal: Soft.  no distension.  no  tenderness.  Musculoskeletal: Normal range of motion.  no  tenderness or deformity.  Neurological:  normal muscle tone. Coordination normal. No atrophy Skin: Skin is warm and dry. No rash noted. not diaphoretic.  Psychiatric:  normal mood and affect. behavior is normal. Thought content normal.   Recent Labs: 12/09/2023: ALT 15; BUN 19; Creatinine, Ser 1.75; Hemoglobin 11.8; Platelets 259.0; Potassium 4.5; Sodium 138    Lipid Panel Lab Results  Component Value Date   CHOL 131 12/09/2023   HDL 54.30 12/09/2023   LDLCALC 58 12/09/2023   TRIG 92.0 12/09/2023    Wt Readings from Last 3 Encounters:  05/01/24 189 lb (85.7 kg)  01/16/24 185 lb (83.9 kg)  12/09/23 190 lb 6 oz (86.4 kg)     ASSESSMENT AND PLAN:  Essential hypertension -  Blood pressure running low, possibly exacerbated by not having breakfast this morning and recent 11 pound weight loss Recommend he decrease amlodipine  down to 5, continue other medications Suggest he monitor blood pressure late morning when he is at home 3 days a week  Hyperlipidemia Cholesterol at goal, continue Lipitor  Carotid stenosis, bilateral Prior history of smoking stable mild disease bilaterally,  Cholesterol at goal  CKD (chronic kidney disease) stage 3, GFR 30-59 ml/min Stable renal function, creatinine 1.7 Blood pressure stable, on lisinopril  and Farxiga  Avoid NSAIDs  Paroxysmal tachycardia Zio monitor performed in 2023, no atrial fibrillation flutter  history of strokes No new symptoms reported today Continue Coreg  25 twice daily  Orders Placed This Encounter  Procedures   EKG 12-Lead      Signed, Velinda Lunger, M.D., Ph.D. 05/01/2024  [facility] [place], [place] [phone]

## 2024-05-01 ENCOUNTER — Encounter: Payer: Self-pay | Admitting: Cardiovascular Disease

## 2024-05-01 ENCOUNTER — Ambulatory Visit: Attending: Cardiovascular Disease | Admitting: Cardiovascular Disease

## 2024-05-01 VITALS — BP 100/54 | HR 59 | Ht 63.0 in | Wt 189.0 lb

## 2024-05-01 DIAGNOSIS — N1832 Chronic kidney disease, stage 3b: Secondary | ICD-10-CM | POA: Insufficient documentation

## 2024-05-01 DIAGNOSIS — E785 Hyperlipidemia, unspecified: Secondary | ICD-10-CM | POA: Insufficient documentation

## 2024-05-01 DIAGNOSIS — I479 Paroxysmal tachycardia, unspecified: Secondary | ICD-10-CM | POA: Insufficient documentation

## 2024-05-01 DIAGNOSIS — I739 Peripheral vascular disease, unspecified: Secondary | ICD-10-CM | POA: Diagnosis not present

## 2024-05-01 DIAGNOSIS — I10 Essential (primary) hypertension: Secondary | ICD-10-CM | POA: Insufficient documentation

## 2024-05-01 DIAGNOSIS — I6523 Occlusion and stenosis of bilateral carotid arteries: Secondary | ICD-10-CM | POA: Diagnosis not present

## 2024-05-01 MED ORDER — CARVEDILOL 25 MG PO TABS: 25.0000 mg | ORAL_TABLET | Freq: Two times a day (BID) | ORAL | 3 refills | Status: AC

## 2024-05-01 MED ORDER — HYDRALAZINE HCL 25 MG PO TABS: 25.0000 mg | ORAL_TABLET | Freq: Three times a day (TID) | ORAL | 3 refills | Status: AC | PRN

## 2024-05-01 MED ORDER — ATORVASTATIN CALCIUM 80 MG PO TABS: 80.0000 mg | ORAL_TABLET | Freq: Every day | ORAL | 3 refills | Status: AC

## 2024-05-01 MED ORDER — LISINOPRIL 20 MG PO TABS: 20.0000 mg | ORAL_TABLET | Freq: Every day | ORAL | 3 refills | Status: AC

## 2024-05-01 MED ORDER — CLONIDINE HCL 0.1 MG PO TABS: 0.1000 mg | ORAL_TABLET | Freq: Two times a day (BID) | ORAL | 3 refills | Status: AC

## 2024-05-01 MED ORDER — AMLODIPINE BESYLATE 5 MG PO TABS: 5.0000 mg | ORAL_TABLET | Freq: Every day | ORAL | 3 refills | Status: AC

## 2024-05-01 NOTE — Patient Instructions (Addendum)
 Medication Instructions:   Please decrease the amlodipine  down to 5 mg daily  Monitor blood pressure late mornings.  If you need a refill on your cardiac medications before your next appointment, please call your pharmacy.   Lab work: No new labs needed  Testing/Procedures: No new testing needed  Follow-Up: At [facility], you and your health needs are our priority.  As part of our continuing mission to provide you with exceptional heart care, we have created designated Provider Care Teams.  These Care Teams include your primary Cardiologist (physician) and Advanced Practice Providers (APPs -  Physician Assistants and Nurse Practitioners) who all work together to provide you with the care you need, when you need it.  You will need a follow up appointment in 12 months  Providers on your designated Care Team:   Lonni Meager, NP Bernardino Bring, PA-C Cadence [place], [place]  COVID-19 Vaccine Information can be found at: [email or web address] For questions related to vaccine distribution or appointments, please email [email or web address] or call [phone].

## 2024-06-05 ENCOUNTER — Ambulatory Visit: Admitting: Family Medicine

## 2024-06-13 ENCOUNTER — Ambulatory Visit: Admitting: Family Medicine

## 2024-06-13 ENCOUNTER — Encounter: Payer: Self-pay | Admitting: Family Medicine

## 2024-06-13 VITALS — BP 120/64 | HR 60 | Temp 98.0°F | Ht 64.0 in | Wt 191.8 lb

## 2024-06-13 DIAGNOSIS — N183 Chronic kidney disease, stage 3 unspecified: Secondary | ICD-10-CM

## 2024-06-13 DIAGNOSIS — Z23 Encounter for immunization: Secondary | ICD-10-CM

## 2024-06-13 DIAGNOSIS — I10 Essential (primary) hypertension: Secondary | ICD-10-CM | POA: Diagnosis not present

## 2024-06-13 DIAGNOSIS — N1832 Chronic kidney disease, stage 3b: Secondary | ICD-10-CM

## 2024-06-13 LAB — URINALYSIS, ROUTINE W REFLEX MICROSCOPIC
Bilirubin Urine: NEGATIVE
Hgb urine dipstick: NEGATIVE
Ketones, ur: NEGATIVE
Leukocytes,Ua: NEGATIVE
Nitrite: NEGATIVE
RBC / HPF: NONE SEEN (ref 0–?)
Specific Gravity, Urine: 1.01 (ref 1.000–1.030)
Total Protein, Urine: NEGATIVE
Urine Glucose: >=1000 — AB
Urobilinogen, UA: 0.2 (ref 0.0–1.0)
pH: 6 (ref 5.0–8.0)

## 2024-06-13 LAB — RENAL FUNCTION PANEL
Albumin: 4.1 g/dL (ref 3.5–5.2)
BUN: 20 mg/dL (ref 6–23)
CO2: 27 meq/L (ref 19–32)
Calcium: 8.8 mg/dL (ref 8.4–10.5)
Chloride: 107 meq/L (ref 96–112)
Creatinine, Ser: 1.76 mg/dL — ABNORMAL HIGH (ref 0.40–1.50)
GFR: 34.91 mL/min — ABNORMAL LOW (ref >60.00)
Glucose, Bld: 101 mg/dL — ABNORMAL HIGH (ref 70–99)
Phosphorus: 3 mg/dL (ref 2.3–4.6)
Potassium: 4.1 meq/L (ref 3.5–5.1)
Sodium: 142 meq/L (ref 135–145)

## 2024-06-13 NOTE — Progress Notes (Signed)
 Ph: [phone] Fax: [phone]   Patient ID: Craig Avery, male    DOB: 04-06-1941, 83 y.o.   MRN: [id number]  This visit was conducted in person.  BP 120/64 (Cuff Size: Large)   Pulse 60   Temp 98 F (36.7 C) (Oral)   Ht 5' 4 (1.626 m)   Wt 191 lb 12.8 oz (87 kg)   SpO2 97%   BMI 32.92 kg/m    CC: 6 mo f/u visit  Subjective:   HPI: Craig Avery is a 83 y.o. male presenting on 06/13/2024 for Medical Management of Chronic Issues (No concerns )   CKD - update renal labs today. Staying well hydrated. He does not add salt to foods.   HTN - Compliant with current antihypertensive regimen of amloidpine 5mg  daily, clonidine  0.1mg  bid, carvedilol  25mg  bid, lisinopril  20mg  daily. He is also on farxiga  10mg  daily. Does not check blood pressures at home. No low blood pressure readings or symptoms of dizziness/syncope. Denies HA, vision changes, CP/tightness, SOB, leg swelling.        Relevant past medical, surgical, family and social history reviewed and updated as indicated. Interim medical history since our last visit reviewed. Allergies and medications reviewed and updated. Outpatient Medications Prior to Visit  Medication Sig Dispense Refill   amLODipine  (NORVASC ) 5 MG tablet Take 1 tablet (5 mg total) by mouth daily. 90 tablet 3   atorvastatin  (LIPITOR) 80 MG tablet Take 1 tablet (80 mg total) by mouth daily. 90 tablet 3   carvedilol  (COREG ) 25 MG tablet Take 1 tablet (25 mg total) by mouth 2 (two) times daily. 180 tablet 3   cloNIDine  (CATAPRES ) 0.1 MG tablet Take 1 tablet (0.1 mg total) by mouth 2 (two) times daily. 180 tablet 3   clopidogrel  (PLAVIX ) 75 MG tablet Take 1 tablet (75 mg total) by mouth daily. 90 tablet 4   dapagliflozin  propanediol (FARXIGA ) 10 MG TABS tablet Take 1 tablet (10 mg total) by mouth daily before breakfast. 90 tablet 4   Docusate Calcium  (STOOL SOFTENER PO) Take by mouth daily.     hydrALAZINE  (APRESOLINE ) 25 MG tablet Take 1 tablet (25  mg total) by mouth 3 (three) times daily as needed. Take for SBP >160 180 tablet 3   lisinopril  (ZESTRIL ) 20 MG tablet Take 1 tablet (20 mg total) by mouth daily. 90 tablet 3   loratadine (CLARITIN) 10 MG tablet Take 10 mg by mouth daily.     Multiple Vitamins-Minerals (CENTRUM SILVER ADULT 50+ PO) Take 1 tablet by mouth daily.     No facility-administered medications prior to visit.     Per HPI unless specifically indicated in ROS section below Review of Systems  Objective:  BP 120/64 (Cuff Size: Large)   Pulse 60   Temp 98 F (36.7 C) (Oral)   Ht 5' 4 (1.626 m)   Wt 191 lb 12.8 oz (87 kg)   SpO2 97%   BMI 32.92 kg/m   Wt Readings from Last 3 Encounters:  06/13/24 191 lb 12.8 oz (87 kg)  05/01/24 189 lb (85.7 kg)  01/16/24 185 lb (83.9 kg)      Physical Exam Vitals and nursing note reviewed.  Constitutional:      Appearance: Normal appearance. He is not ill-appearing.  HENT:     Head: Normocephalic and atraumatic.     Mouth/Throat:     Mouth: Mucous membranes are moist.     Pharynx: Oropharynx is clear. No oropharyngeal exudate or posterior  oropharyngeal erythema.  Cardiovascular:     Rate and Rhythm: Normal rate and regular rhythm.     Pulses: Normal pulses.     Heart sounds: Normal heart sounds.  Pulmonary:     Effort: Pulmonary effort is normal. No respiratory distress.     Breath sounds: Normal breath sounds. No wheezing, rhonchi or rales.  Musculoskeletal:     Right lower leg: No edema.     Left lower leg: No edema.  Skin:    General: Skin is warm and dry.     Findings: No rash.  Neurological:     Mental Status: He is alert.  Psychiatric:        Mood and Affect: Mood normal.        Behavior: Behavior normal.       Results for orders placed or performed during the hospital encounter of 12/30/23  VAS US  ABI WITH/WO TBI   Collection Time: 12/30/23 12:45 PM  Result Value Ref Range   Right ABI 0.83    Left ABI 1.02     Assessment & Plan:   Problem  List Items Addressed This Visit     HTN (hypertension)   Chronic, great control on current 5 drug regimen - continue.       CKD (chronic kidney disease) stage 3, GFR 30-59 ml/min (HCC) - Primary   Chronic , stable period. 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  Update renal panel and urinalysis.  Consider renal imaging.       Relevant Orders   Renal function panel   Urinalysis, Routine w reflex microscopic   Other Visit Diagnoses       Encounter for immunization       Relevant Orders   Flu vaccine HIGH DOSE PF(Fluzone Trivalent) (Completed)        No orders of the defined types were placed in this encounter.   Orders Placed This Encounter  Procedures   Flu vaccine HIGH DOSE PF(Fluzone Trivalent)   Renal function panel   Urinalysis, Routine w reflex microscopic    Patient Instructions  Flu shot today Labs today You are doing well - continue current medicines Return in 6 months for medicare wellness visit  Follow up plan: Return in about 6 months (around 12/12/2024) for medicare wellness visit.  Craig Blas, MD

## 2024-06-13 NOTE — Assessment & Plan Note (Addendum)
 Chronic, great control on current 5 drug regimen - continue.

## 2024-06-13 NOTE — Assessment & Plan Note (Signed)
 Chronic , stable period. 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  Update renal panel and urinalysis.  Consider renal imaging.

## 2024-06-13 NOTE — Patient Instructions (Signed)
 Flu shot today Labs today You are doing well - continue current medicines Return in 6 months for medicare wellness visit

## 2024-06-14 ENCOUNTER — Ambulatory Visit: Payer: Self-pay | Admitting: Family Medicine

## 2024-06-14 DIAGNOSIS — N1832 Chronic kidney disease, stage 3b: Secondary | ICD-10-CM

## 2024-11-27 ENCOUNTER — Ambulatory Visit
